# Patient Record
Sex: Female | Born: 1965 | Race: Black or African American | Hispanic: No | Marital: Single | Smoking: Never smoker
Health system: Southern US, Community
[De-identification: ages and names within clinical notes are randomized; demographics above are authoritative.]

## PROBLEM LIST (undated history)

## (undated) DIAGNOSIS — K224 Dyskinesia of esophagus: Secondary | ICD-10-CM

## (undated) DIAGNOSIS — I739 Peripheral vascular disease, unspecified: Secondary | ICD-10-CM

## (undated) DIAGNOSIS — K219 Gastro-esophageal reflux disease without esophagitis: Secondary | ICD-10-CM

## (undated) DIAGNOSIS — D259 Leiomyoma of uterus, unspecified: Secondary | ICD-10-CM

## (undated) DIAGNOSIS — E559 Vitamin D deficiency, unspecified: Secondary | ICD-10-CM

## (undated) DIAGNOSIS — R202 Paresthesia of skin: Secondary | ICD-10-CM

## (undated) DIAGNOSIS — R700 Elevated erythrocyte sedimentation rate: Secondary | ICD-10-CM

## (undated) DIAGNOSIS — I729 Aneurysm of unspecified site: Secondary | ICD-10-CM

## (undated) DIAGNOSIS — D472 Monoclonal gammopathy: Secondary | ICD-10-CM

## (undated) DIAGNOSIS — D62 Acute posthemorrhagic anemia: Secondary | ICD-10-CM

## (undated) DIAGNOSIS — T81718A Complication of other artery following a procedure, not elsewhere classified, initial encounter: Secondary | ICD-10-CM

## (undated) DIAGNOSIS — I3139 Other pericardial effusion (noninflammatory): Secondary | ICD-10-CM

## (undated) DIAGNOSIS — R079 Chest pain, unspecified: Secondary | ICD-10-CM

## (undated) DIAGNOSIS — R200 Anesthesia of skin: Secondary | ICD-10-CM

## (undated) DIAGNOSIS — Z862 Personal history of diseases of the blood and blood-forming organs and certain disorders involving the immune mechanism: Secondary | ICD-10-CM

## (undated) DIAGNOSIS — I10 Essential (primary) hypertension: Secondary | ICD-10-CM

## (undated) HISTORY — DX: Chest pain, unspecified: R07.9

## (undated) HISTORY — DX: Dyskinesia of esophagus: K22.4

## (undated) HISTORY — PX: BREAST EXCISIONAL BIOPSY: SUR124

## (undated) HISTORY — DX: Monoclonal gammopathy: D47.2

## (undated) HISTORY — DX: Paresthesia of skin: R20.2

## (undated) HISTORY — DX: Vitamin D deficiency, unspecified: E55.9

## (undated) HISTORY — PX: OTHER SURGICAL HISTORY: SHX169

## (undated) HISTORY — DX: Other pericardial effusion (noninflammatory): I31.39

## (undated) HISTORY — DX: Leiomyoma of uterus, unspecified: D25.9

## (undated) HISTORY — DX: Gastro-esophageal reflux disease without esophagitis: K21.9

## (undated) HISTORY — PX: BREAST LUMPECTOMY: SHX2

## (undated) HISTORY — DX: Essential (primary) hypertension: I10

## (undated) HISTORY — DX: Anesthesia of skin: R20.0

## (undated) HISTORY — PX: BREAST BIOPSY: SHX20

---
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
## 2012-10-03 ENCOUNTER — Other Ambulatory Visit: Payer: Self-pay

## 2012-10-03 DIAGNOSIS — Z1231 Encounter for screening mammogram for malignant neoplasm of breast: Secondary | ICD-10-CM

## 2012-10-05 ENCOUNTER — Ambulatory Visit: Payer: Self-pay

## 2012-10-05 ENCOUNTER — Other Ambulatory Visit: Payer: Self-pay | Admitting: Family Medicine

## 2012-10-05 DIAGNOSIS — R1900 Intra-abdominal and pelvic swelling, mass and lump, unspecified site: Secondary | ICD-10-CM

## 2012-10-19 ENCOUNTER — Other Ambulatory Visit (HOSPITAL_COMMUNITY)
Admission: RE | Admit: 2012-10-19 | Discharge: 2012-10-19 | Disposition: A | Discharge status: Home / Self Care | Payer: Managed Care, Other (non HMO) | Source: Ambulatory Visit | Attending: Family Medicine | Admitting: Family Medicine

## 2012-10-19 ENCOUNTER — Ambulatory Visit
Admission: RE | Admit: 2012-10-19 | Discharge: 2012-10-19 | Disposition: A | Discharge status: Home / Self Care | Payer: Managed Care, Other (non HMO) | Source: Ambulatory Visit | Attending: Family Medicine | Admitting: Family Medicine

## 2012-10-19 ENCOUNTER — Other Ambulatory Visit: Payer: Self-pay | Admitting: Family Medicine

## 2012-10-19 DIAGNOSIS — Z113 Encounter for screening for infections with a predominantly sexual mode of transmission: Secondary | ICD-10-CM | POA: Insufficient documentation

## 2012-10-19 DIAGNOSIS — Z124 Encounter for screening for malignant neoplasm of cervix: Secondary | ICD-10-CM | POA: Insufficient documentation

## 2012-10-19 DIAGNOSIS — R1900 Intra-abdominal and pelvic swelling, mass and lump, unspecified site: Secondary | ICD-10-CM

## 2012-10-19 DIAGNOSIS — R87810 Cervical high risk human papillomavirus (HPV) DNA test positive: Secondary | ICD-10-CM | POA: Insufficient documentation

## 2012-10-19 DIAGNOSIS — Z1151 Encounter for screening for human papillomavirus (HPV): Secondary | ICD-10-CM | POA: Insufficient documentation

## 2012-10-20 ENCOUNTER — Ambulatory Visit
Admission: RE | Admit: 2012-10-20 | Discharge: 2012-10-20 | Disposition: A | Discharge status: Home / Self Care | Payer: Managed Care, Other (non HMO) | Source: Ambulatory Visit

## 2012-10-20 DIAGNOSIS — Z1231 Encounter for screening mammogram for malignant neoplasm of breast: Secondary | ICD-10-CM

## 2013-02-05 ENCOUNTER — Telehealth: Payer: Self-pay | Admitting: *Deleted

## 2013-02-05 NOTE — Telephone Encounter (Signed)
S/w pt gxt is changed to 10/16 @ 1:00 per Christina [name] and pt advised and verbally understood and is agreeable to plan

## 2013-02-12 ENCOUNTER — Other Ambulatory Visit (HOSPITAL_COMMUNITY): Payer: Self-pay | Admitting: Cardiology

## 2013-02-12 DIAGNOSIS — R079 Chest pain, unspecified: Secondary | ICD-10-CM

## 2013-02-15 ENCOUNTER — Other Ambulatory Visit (HOSPITAL_COMMUNITY): Payer: Managed Care, Other (non HMO)

## 2013-02-15 ENCOUNTER — Encounter: Payer: Managed Care, Other (non HMO) | Admitting: Nurse Practitioner

## 2013-06-04 ENCOUNTER — Encounter: Payer: Self-pay | Admitting: *Deleted

## 2013-06-04 ENCOUNTER — Encounter: Payer: Self-pay | Admitting: Cardiology

## 2013-06-04 DIAGNOSIS — E559 Vitamin D deficiency, unspecified: Secondary | ICD-10-CM | POA: Insufficient documentation

## 2013-06-04 DIAGNOSIS — D259 Leiomyoma of uterus, unspecified: Secondary | ICD-10-CM | POA: Insufficient documentation

## 2013-06-04 DIAGNOSIS — R0789 Other chest pain: Secondary | ICD-10-CM | POA: Insufficient documentation

## 2013-08-10 ENCOUNTER — Emergency Department (HOSPITAL_BASED_OUTPATIENT_CLINIC_OR_DEPARTMENT_OTHER)
Admission: EM | Admit: 2013-08-10 | Discharge: 2013-08-10 | Discharge status: Against Medical Advice | Payer: Managed Care, Other (non HMO) | Attending: Emergency Medicine | Admitting: Emergency Medicine

## 2013-08-10 DIAGNOSIS — Y9389 Activity, other specified: Secondary | ICD-10-CM | POA: Insufficient documentation

## 2013-08-10 DIAGNOSIS — S01501A Unspecified open wound of lip, initial encounter: Secondary | ICD-10-CM | POA: Insufficient documentation

## 2013-08-10 DIAGNOSIS — Y92410 Unspecified street and highway as the place of occurrence of the external cause: Secondary | ICD-10-CM | POA: Insufficient documentation
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
## 2013-08-10 NOTE — ED Notes (Addendum)
Pt refusing evaluation and treatment and left ED.  Discussed treatment options with pt and pt continues to want to leave.  Pt CAOx4 and ambulatory when left ED. Pt left without signing.

## 2014-01-15 ENCOUNTER — Other Ambulatory Visit: Payer: Self-pay

## 2016-06-23 ENCOUNTER — Ambulatory Visit (INDEPENDENT_AMBULATORY_CARE_PROVIDER_SITE_OTHER): Payer: BLUE CROSS/BLUE SHIELD | Admitting: Family Medicine

## 2016-06-23 ENCOUNTER — Encounter: Payer: Self-pay | Admitting: Internal Medicine

## 2016-06-23 ENCOUNTER — Encounter: Payer: Self-pay | Admitting: Family Medicine

## 2016-06-23 ENCOUNTER — Other Ambulatory Visit: Payer: Self-pay | Admitting: Family Medicine

## 2016-06-23 VITALS — BP 134/88 | HR 84 | Temp 98.9°F | Ht 65.7 in | Wt 251.4 lb

## 2016-06-23 DIAGNOSIS — Z1159 Encounter for screening for other viral diseases: Secondary | ICD-10-CM

## 2016-06-23 DIAGNOSIS — Z800 Family history of malignant neoplasm of digestive organs: Secondary | ICD-10-CM

## 2016-06-23 DIAGNOSIS — Z13220 Encounter for screening for lipoid disorders: Secondary | ICD-10-CM | POA: Diagnosis not present

## 2016-06-23 DIAGNOSIS — Z1329 Encounter for screening for other suspected endocrine disorder: Secondary | ICD-10-CM

## 2016-06-23 DIAGNOSIS — Z1211 Encounter for screening for malignant neoplasm of colon: Secondary | ICD-10-CM

## 2016-06-23 DIAGNOSIS — Z131 Encounter for screening for diabetes mellitus: Secondary | ICD-10-CM

## 2016-06-23 DIAGNOSIS — R072 Precordial pain: Secondary | ICD-10-CM | POA: Diagnosis not present

## 2016-06-23 DIAGNOSIS — Z1231 Encounter for screening mammogram for malignant neoplasm of breast: Secondary | ICD-10-CM

## 2016-06-23 NOTE — Progress Notes (Signed)
Pre visit review using our clinic tool,if applicable. No additional management support is needed unless otherwise documented below in the visit note.  

## 2016-06-23 NOTE — Progress Notes (Signed)
[place] at [facility] [address] [place], [place] [address] [phone]  Date:  06/23/2016   Name:  Christina [name]   DOB:  Feb 13, 1966   MRN:  [id number]  PCP:  Lamar Blinks, MD    Chief Complaint: Establish Care   History of Present Illness:  Christina [name] is a 51 y.o. very pleasant female patient who presents with the following:  Here today as a new patient.  She is not new to this area however- admits that she has not had much in the way of medical care over the last several years  She is due for a pap, physical, mammogram Her mother was dx with colon cancer- she needs to see GI herself as she has never had a colonoscopy.   Her mother is doing well thankfully  She is not on any medications Her last pap was several years ago. She may have had an abnl many years ago but none more recently.  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   Also has had noted a pain that will be in her epigastric area and then "roll up my throat and into the lower jaw," she has noted this for about 2 years.  It can occur any time, and may last 5- 10 minutes Most recently occurred last week.  Is it NOT associated with exercise and she cannot determine any other inciting factors. No active CP or SOB. She has not sought evaluation for this in the past and has not tried any treatment for same  Patient Active Problem List   Diagnosis Date Noted  . Uterine fibroid   . Vitamin D deficiency   . Chest pain     Past Medical History:  Diagnosis Date  . Chest pain   . Uterine fibroid   . Vitamin D deficiency     Past Surgical History:  Procedure Laterality Date  . lump removed from breast at age 46    . ovarian cyst removed at 51yr old      Social  History  Substance Use Topics  . Smoking status: Never Smoker  . Smokeless tobacco: Not on file  . Alcohol use No    Family History  Problem Relation Age of Onset  . Hypertension Mother   . Cancer Mother   . Dementia Father     No Known Allergies  Medication list has been reviewed and updated.  No current outpatient prescriptions on file prior to visit.   No current facility-administered medications on file prior to visit.     Review of Systems:  As per HPI- otherwise negative.   Physical Examination: Vitals:   06/23/16 1409  BP: 134/88  Pulse: 84  Temp: 98.9 F (37.2 C)   Vitals:   06/23/16 1409  Weight: 251 lb 6.4 oz (114 kg)  Height: 5' 5.7" (1.669 m)   Body mass index is 40.95 kg/m. Ideal Body Weight: Weight in (lb) to have BMI = 25: 153.2  GEN: WDWN, NAD, Non-toxic, A & O x 3, obese, otherwise looks well HEENT: Atraumatic, Normocephalic. Neck supple. No masses, No LAD.  Bilateral TM wnl, oropharynx normal.  PEERL,EOMI.   Ears and Nose: No  external deformity. CV: RRR, No M/G/R. No JVD. No thrill. No extra heart sounds. PULM: CTA B, no wheezes, crackles, rhonchi. No retractions. No resp. distress. No accessory muscle use. ABD: S, NT, ND, +BS. No rebound. No HSM. EXTR: No c/c/e NEURO Normal gait.  PSYCH: Normally interactive. Conversant. Not depressed or anxious appearing.  Calm demeanor.   EKG: no old tracing for comparison. SR with possible LVH, early repol and non specific T wave abnl.  Rate 81 Assessment and Plan: Precordial pain - Plan: EKG 12-Lead, Ambulatory referral to Cardiology  Family history of colon cancer - Plan: Ambulatory referral to Gastroenterology  Screening for colon cancer - Plan: CBC, Ambulatory referral to Gastroenterology  Screening for thyroid disorder - Plan: TSH  Encounter for hepatitis C screening test for low risk patient - Plan: Hepatitis C antibody  Screening for hyperlipidemia - Plan: Lipid panel  Screening for  diabetes mellitus - Plan: Comprehensive metabolic panel, Hemoglobin A1c  Here today to establish care and discuss a few concerns She is quite behind on her health maint- will work on catching up as above She has noted epigastric/ chest pain as discussed in the HPI Will refer to cardiology for eval asap, but do suspect this may be GI in origin.  Will try having her take an H2 blocker daily and use tums in the case of acute pain.    See patient instructions for more details.   Asked her to see me soon for her pap and to discuss a chronic knee pain   Signed Lamar Blinks, MD

## 2016-06-23 NOTE — Patient Instructions (Signed)
I am going to refer you to see a GI doctor and also a cardiologist.  In the meantime I would suggest that you try taking zantac once a day for about one month, and chew up a dose of tums if you have an attack of pain. If this helps your pain may well be due to stomach acid.  However if you have any different or worsening episodes of pain in the meantime please seek care I will be in touch with your labs asap  Please schedule a follow-up visit to see me and have your pap/ discuss your knee problem soon

## 2016-06-24 ENCOUNTER — Encounter: Payer: Self-pay | Admitting: Family Medicine

## 2016-06-24 DIAGNOSIS — R7303 Prediabetes: Secondary | ICD-10-CM

## 2016-06-24 LAB — COMPREHENSIVE METABOLIC PANEL
ALT: 10 U/L (ref 0–35)
AST: 16 U/L (ref 0–37)
Albumin: 4 g/dL (ref 3.5–5.2)
Alkaline Phosphatase: 77 U/L (ref 39–117)
BUN: 12 mg/dL (ref 6–23)
CO2: 28 mEq/L (ref 19–32)
Calcium: 9.2 mg/dL (ref 8.4–10.5)
Chloride: 105 mEq/L (ref 96–112)
Creatinine, Ser: 0.8 mg/dL (ref 0.40–1.20)
GFR: 96.72 mL/min (ref >60.00)
Glucose, Bld: 96 mg/dL (ref 70–99)
Potassium: 4.7 mEq/L (ref 3.5–5.1)
Sodium: 136 mEq/L (ref 135–145)
Total Bilirubin: 0.2 mg/dL (ref 0.2–1.2)
Total Protein: 7.8 g/dL (ref 6.0–8.3)

## 2016-06-24 LAB — CBC
HCT: 39 % (ref 36.0–46.0)
Hemoglobin: 12.7 g/dL (ref 12.0–15.0)
MCHC: 32.7 g/dL (ref 30.0–36.0)
MCV: 87.1 fl (ref 78.0–100.0)
Platelets: 234 10*3/uL (ref 150.0–400.0)
RBC: 4.48 Mil/uL (ref 3.87–5.11)
RDW: 16.3 % — ABNORMAL HIGH (ref 11.5–15.5)
WBC: 4.6 10*3/uL (ref 4.0–10.5)

## 2016-06-24 LAB — LIPID PANEL
Cholesterol: 179 mg/dL (ref 0–200)
HDL: 66.7 mg/dL
LDL Cholesterol: 94 mg/dL (ref 0–99)
NonHDL: 112.26
Total CHOL/HDL Ratio: 3
Triglycerides: 93 mg/dL (ref 0.0–149.0)
VLDL: 18.6 mg/dL (ref 0.0–40.0)

## 2016-06-24 LAB — TSH: TSH: 0.42 u[IU]/mL (ref 0.35–4.50)

## 2016-06-24 LAB — HEMOGLOBIN A1C: Hgb A1c MFr Bld: 6 % (ref 4.6–6.5)

## 2016-06-24 LAB — HEPATITIS C ANTIBODY: HCV Ab: NEGATIVE

## 2016-06-26 ENCOUNTER — Encounter (HOSPITAL_BASED_OUTPATIENT_CLINIC_OR_DEPARTMENT_OTHER): Payer: Self-pay

## 2016-06-26 ENCOUNTER — Ambulatory Visit (HOSPITAL_BASED_OUTPATIENT_CLINIC_OR_DEPARTMENT_OTHER)
Admission: RE | Admit: 2016-06-26 | Discharge: 2016-06-26 | Disposition: A | Discharge status: Home / Self Care | Payer: BLUE CROSS/BLUE SHIELD | Source: Ambulatory Visit | Attending: Family Medicine | Admitting: Family Medicine

## 2016-06-26 DIAGNOSIS — Z1231 Encounter for screening mammogram for malignant neoplasm of breast: Secondary | ICD-10-CM

## 2016-06-30 ENCOUNTER — Ambulatory Visit: Payer: BLUE CROSS/BLUE SHIELD | Admitting: Cardiovascular Disease

## 2016-07-08 ENCOUNTER — Ambulatory Visit (INDEPENDENT_AMBULATORY_CARE_PROVIDER_SITE_OTHER): Payer: BLUE CROSS/BLUE SHIELD | Admitting: Family Medicine

## 2016-07-08 ENCOUNTER — Encounter: Payer: Self-pay | Admitting: Family Medicine

## 2016-07-08 ENCOUNTER — Other Ambulatory Visit (HOSPITAL_COMMUNITY)
Admission: RE | Admit: 2016-07-08 | Discharge: 2016-07-08 | Disposition: A | Discharge status: Home / Self Care | Payer: BLUE CROSS/BLUE SHIELD | Source: Ambulatory Visit | Attending: Family Medicine | Admitting: Family Medicine

## 2016-07-08 VITALS — BP 162/100 | HR 90 | Temp 98.5°F | Ht 66.0 in | Wt 257.2 lb

## 2016-07-08 DIAGNOSIS — Z124 Encounter for screening for malignant neoplasm of cervix: Secondary | ICD-10-CM

## 2016-07-08 DIAGNOSIS — Z0000 Encounter for general adult medical examination without abnormal findings: Secondary | ICD-10-CM | POA: Diagnosis not present

## 2016-07-08 DIAGNOSIS — E669 Obesity, unspecified: Secondary | ICD-10-CM

## 2016-07-08 DIAGNOSIS — I10 Essential (primary) hypertension: Secondary | ICD-10-CM

## 2016-07-08 DIAGNOSIS — D259 Leiomyoma of uterus, unspecified: Secondary | ICD-10-CM | POA: Diagnosis not present

## 2016-07-08 DIAGNOSIS — Z1151 Encounter for screening for human papillomavirus (HPV): Secondary | ICD-10-CM | POA: Insufficient documentation

## 2016-07-08 DIAGNOSIS — Z113 Encounter for screening for infections with a predominantly sexual mode of transmission: Secondary | ICD-10-CM | POA: Insufficient documentation

## 2016-07-08 DIAGNOSIS — N921 Excessive and frequent menstruation with irregular cycle: Secondary | ICD-10-CM

## 2016-07-08 DIAGNOSIS — Z01411 Encounter for gynecological examination (general) (routine) with abnormal findings: Secondary | ICD-10-CM | POA: Insufficient documentation

## 2016-07-08 HISTORY — DX: Essential (primary) hypertension: I10

## 2016-07-08 MED ORDER — LISINOPRIL 20 MG PO TABS: ORAL_TABLET | ORAL | 5 refills | Status: DC

## 2016-07-08 NOTE — Progress Notes (Addendum)
[place] at [facility] [place], [place], [place] [address] [phone]  Date:  07/08/2016   Name:  Christina [name]   DOB:  October 24, 1965   MRN:  [id number]  PCP:  Lamar Blinks, MD    Chief Complaint: Gynecologic Exam (Pt here for PAP smear. )   History of Present Illness:  Christina [name] is a 51 y.o. very pleasant female patient who presents with the following:  I saw her as a new patient in February- she had been absent from health care for some time Labs at that time looked overall good- A1c at 6% She got her mammogram- normal She has scheduled a colonoscopy She mentions that she tends to have heavy vaginal bleeding every 2 weeks, and it will last for 7 days. She endorses passing a lot of blood.  She did have a pelvic US back in 2014 which showed fibroids  Recent CBC did not show anemia  Here today for PAP.  Last PAP 10-09-2012.  Results from last PAP:  NEGATIVE FOR INTRAEPITHELIAL LESIONS OR MALIGNANCY. BENIGN REACTIVE/REPARATIVE CHANGES. POSITIVE for Prevotella bivia  Never been on any BP medication - however she notes that she has been told that her BP was high on several occasions and she would like to have treatment if needed She reports a strong family history of HTN She notes periodic HA and wonders if this may be due to HTN Her BP is at clinic with her today- she is SA with him per her report    BP Readings from Last 3 Encounters:  07/08/16 (!) 172/102  06/23/16 134/88     Patient Active Problem List   Diagnosis Date Noted  . Pre-diabetes 06/24/2016  . Uterine fibroid   . Vitamin D deficiency   . Chest pain     Past Medical History:  Diagnosis Date  . Chest pain   . Uterine fibroid   . Vitamin D deficiency     Past Surgical History:  Procedure Laterality Date  . lump removed from breast at age 27    . ovarian cyst removed at 51yr old      Social History  Substance Use Topics  . Smoking  status: Never Smoker  . Smokeless tobacco: Not on file  . Alcohol use No    Family History  Problem Relation Age of Onset  . Hypertension Mother   . Cancer Mother   . Dementia Father     No Known Allergies  Medication list has been reviewed and updated.  No current outpatient prescriptions on file prior to visit.   No current facility-administered medications on file prior to visit.     Review of Systems:  As per HPI- otherwise negative. No fever, chills, nausea, vomiting, diarrhea, rash, CP or SOB  Physical Examination: Vitals:   07/08/16 1720 07/08/16 1724  BP: (!) 178/102 (!) 172/102  Pulse: 90   Temp: 98.5 F (36.9 C)    Vitals:   07/08/16 1720  Weight: 257 lb 3.2 oz (116.7 kg)  Height: 5\' 6"  (1.676 m)   Body mass index is 41.51 kg/m. Ideal Body Weight: Weight in (lb) to have BMI = 25: 154.6  GEN: WDWN, NAD, Non-toxic, A & O x 3, obese, ow looks well HEENT: Atraumatic, Normocephalic. Neck supple. No masses, No LAD. Ears and Nose: No external deformity. CV: RRR, No M/G/R. No JVD. No thrill. No extra heart sounds. PULM: CTA B, no wheezes, crackles, rhonchi.  No retractions. No resp. distress. No accessory muscle use. ABD: S, NT, ND, +BS. No rebound. No HSM. EXTR: No c/c/e NEURO Normal gait.  PSYCH: Normally interactive. Conversant. Not depressed or anxious appearing.  Calm demeanor.  Pelvic: thick fishy discharge in vault.   Uterus normal, no CMT, no adnexal tendereness or masses  Assessment and Plan:  Wellness examination  Screening for cervical cancer - Plan: Cytology - PAP, CANCELED: Cytology - PAP  Menorrhagia with irregular cycle - Plan: Ambulatory referral to Obstetrics / Gynecology  Uterine leiomyoma, unspecified location  Essential hypertension - Plan: lisinopril (PRINIVIL,ZESTRIL) 20 MG tablet  Here today for her wellness exam- she has done a great job catching up with her helth care  She is not sure of the exact date but feels sure that  her last tetanus was less than 10 years ago Pap done today- will be in touch with results.  Also added gc/chlamydia and trick to pap due to discharge present on exam She notes bothersome menorrhagia and really would like to do something about this if possible.  Discussed a few possible treatment options and referred to OBG to discuss which would be most appropriate for her Will start on 10- mg of lisinopril for HTN Encouraged weight loss  She will check her BP a few times a week and contact me with an update- we may increase to 20 mg if indicated   Signed Lamar Blinks, MD  Received her pap results 3/13 Results for orders placed or performed in visit on 07/08/16  Cytology - PAP  Result Value Ref Range   Adequacy      Satisfactory for evaluation  endocervical/transformation zone component ABSENT.   Diagnosis      NEGATIVE FOR INTRAEPITHELIAL LESIONS OR MALIGNANCY.   Diagnosis CELLULAR CHANGES CONSISTENT WITH HYPERKERATOSIS.    Diagnosis SHIFT IN FLORA SUGGESTIVE OF BACTERIAL VAGINOSIS.    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  Will treat for BV with flagyl No pap in many years Difficult pap due to obesity, transformation zone absent and positive for HPV- will refer to OBG for possible colposcopy

## 2016-07-08 NOTE — Patient Instructions (Signed)
We will send off your pap and I will be in touch with this asap I am going to refer you to OB-GYN to discuss your heavy menstrual bleeding you may be a candidate for an ablation or other treatment for your fibroids  Please start on a 1/2 pill of the lisinopril- 10 mg- once a day.  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  We can increase to a whole tablet as needed

## 2016-07-09 ENCOUNTER — Encounter: Payer: Self-pay | Admitting: Physician Assistant

## 2016-07-12 LAB — CYTOLOGY - PAP
Adequacy: ABSENT
Chlamydia: NEGATIVE
Diagnosis: NEGATIVE
HPV: DETECTED — AB
Neisseria Gonorrhea: NEGATIVE
Trichomonas: NEGATIVE

## 2016-07-14 ENCOUNTER — Telehealth: Payer: Self-pay | Admitting: Family Medicine

## 2016-07-14 DIAGNOSIS — B9689 Other specified bacterial agents as the cause of diseases classified elsewhere: Secondary | ICD-10-CM

## 2016-07-14 DIAGNOSIS — B977 Papillomavirus as the cause of diseases classified elsewhere: Secondary | ICD-10-CM

## 2016-07-14 DIAGNOSIS — N760 Acute vaginitis: Principal | ICD-10-CM

## 2016-07-14 MED ORDER — METRONIDAZOLE 500 MG PO TABS: 500.0000 mg | ORAL_TABLET | Freq: Two times a day (BID) | ORAL | 0 refills | Status: DC

## 2016-07-14 NOTE — Telephone Encounter (Signed)
Called her- HPV on her pap.  Will refer to OBG for eval, ? Colposcopy Also will treat for BV with flagyl  She states understanding

## 2016-07-16 ENCOUNTER — Ambulatory Visit: Payer: BLUE CROSS/BLUE SHIELD | Admitting: Physician Assistant

## 2016-08-04 ENCOUNTER — Telehealth: Payer: Self-pay | Admitting: Family Medicine

## 2016-08-04 NOTE — Telephone Encounter (Signed)
Pt called and states received a bill on her last visit 08-01-16 her visit was coded wrong, pt states had her Wellness Visit and that it should be under wellness visit. Please verify and correct if possible. Pt also mention will be calling billing department. (pt insurance [employer])

## 2016-08-10 ENCOUNTER — Telehealth: Payer: Self-pay

## 2016-08-10 NOTE — Telephone Encounter (Signed)
Patient stated she needs another referral to the Cardiologist. She stated the office she was referred to has cancelled on her 3 times and pushed her appointments out further and further.     Please advise

## 2016-08-11 NOTE — Telephone Encounter (Signed)
Referral was placed 06/23/16, pt was contacted 06/28/16 and scheduled for 06/30/16 with Dr. Johnsie Cancel, pt declined and was rescheduled for 07/16/16 with Richardson Dopp, [place]  07/13/16 pt was contact that Richardson Dopp, [place] would be out of office and appt was rescheduled for 08/16/16 with Dr. Saunders Revel, pt was contacted on 08/06/16 to be rescheduled due to scheduling error and pt was in an incorrect slot, pt was rescheduled for 08/16/16 with Richardson Dopp, [place] - at that time pt declined appt with PA stating she wanted to see a doctor and she was rescheduled for first available appt with doctor 09/06/16 Dr. Acie Fredrickson  I have contacted [facility] to discuss and they do not have any appts available prior to 09/06/16 with any doctor. Pt has declined appts with PA.   Please advise.

## 2016-08-11 NOTE — Telephone Encounter (Signed)
Can we please call [facility] and see what the issue is, try to get her scheduled?  Thank you!

## 2016-08-12 NOTE — Telephone Encounter (Signed)
Ok- since it has been this long I think the 5/7 appt is ok.  Does she still have this appt?

## 2016-08-12 NOTE — Telephone Encounter (Signed)
Patient called to inquire about the bill she received for her New Patient appointment with Dr. Lorelei Pont. States she was not informed that her appointment was a NP appointment, she asked for a CPE. Explained to patient that the initial appointment is a NP appointment with provider. Patient stated that if she knew that she would not have come because she has a $300 bill and Dr. Lorelei Pont did labs, EKG and Pap on that day. Explained to patient that if the provider performed all of those things it was a CPE. Explained that her concerns had been forwarded to Ricardo Jericho who will research and adjust what needs to be adjusted. Patient stated she had been waiting since 4/4 and being told that someone would call her. Stated she need to find another practice that know how to do billing.

## 2016-08-13 NOTE — Telephone Encounter (Signed)
Pt has been notified and understood. She is fine with keeping the appt for 5/7.

## 2016-08-13 NOTE — Telephone Encounter (Signed)
Yes, 5/7 appt is still standing

## 2016-08-13 NOTE — Telephone Encounter (Signed)
Coding has reviewed and the visit is coded correctly. Patient was established on 06/23/16 a couple other concerns were addressed. I left patient a message to call back for additional information,.

## 2016-08-13 NOTE — Telephone Encounter (Signed)
Ok- please give her a call and let her know that I am ok with the 5/7 appt.  We can certainly refer her to a different office but that would mean starting over and would probably take longer

## 2016-08-13 NOTE — Telephone Encounter (Signed)
Patient is questioning the coding on her initial visit. I informed the patient coding will review and I will update her on any corrections

## 2016-08-16 ENCOUNTER — Encounter: Payer: BLUE CROSS/BLUE SHIELD | Admitting: Internal Medicine

## 2016-08-16 ENCOUNTER — Ambulatory Visit: Payer: BLUE CROSS/BLUE SHIELD | Admitting: Internal Medicine

## 2016-08-16 ENCOUNTER — Ambulatory Visit: Payer: BLUE CROSS/BLUE SHIELD | Admitting: Physician Assistant

## 2016-08-17 ENCOUNTER — Other Ambulatory Visit: Payer: Self-pay | Admitting: Family Medicine

## 2016-08-17 NOTE — Telephone Encounter (Signed)
Relation to FB:PPHK Call back number: [phone]  Pharmacy: [facility], [place] [phone] (Phone) [phone] (Fax)     Reason for call:  Patient states lisinopril (PRINIVIL,ZESTRIL) 20 MG tablet is causing patient to cough which is affecting her work, please advise.

## 2016-08-18 MED ORDER — LOSARTAN POTASSIUM 50 MG PO TABS: 50.0000 mg | ORAL_TABLET | Freq: Every day | ORAL | 3 refills | Status: DC

## 2016-08-18 NOTE — Telephone Encounter (Signed)
Called her back- her BP is 138/80, she is doing well with the lisinopril except for her cough.  Will change to losartan.  She will let me know if the cough does not resolve with this change

## 2016-08-18 NOTE — Telephone Encounter (Signed)
Returned patient's phone call/ Left message for her to call me back

## 2016-08-18 NOTE — Telephone Encounter (Signed)
Patient calling check status of medication change. Medication is making her cough please advise.

## 2016-08-25 ENCOUNTER — Encounter: Payer: Self-pay | Admitting: Obstetrics & Gynecology

## 2016-08-25 ENCOUNTER — Ambulatory Visit (INDEPENDENT_AMBULATORY_CARE_PROVIDER_SITE_OTHER): Payer: BLUE CROSS/BLUE SHIELD | Admitting: Obstetrics & Gynecology

## 2016-08-25 VITALS — BP 150/94 | HR 98 | Ht 67.0 in | Wt 258.0 lb

## 2016-08-25 DIAGNOSIS — D259 Leiomyoma of uterus, unspecified: Secondary | ICD-10-CM | POA: Diagnosis not present

## 2016-08-25 DIAGNOSIS — N939 Abnormal uterine and vaginal bleeding, unspecified: Secondary | ICD-10-CM

## 2016-08-25 NOTE — Progress Notes (Signed)
Christina [name] complaining of heavy periods. Christina [name] states that last month she had a periods three times. Christina [name] states she also had an abnormal pap that Dr. Edilia Bo did and she needs that reviewed. Kathrene Alu RNBSN

## 2016-08-25 NOTE — Patient Instructions (Addendum)
GO WHITE: Soap: UNSCENTED Dove (white box light green writing) Laundry detergent (underwear)- Dreft or [employer] unscented WHITE 100% cotton panties (NOT just cotton crouch) Sanitary napkin/panty liners: UNSCENTED.  If it doesn't SAY unscented it can have a scent/perfume    NO PERFUMES OR LOTIONS OR POTIONS in the vulvar area (may use regular KY) Condoms: hypoallergenic only. Non dyed (no color) Toilet papers: white only Wash clothes: use a separate wash cloth. WHITE.  Washed in Dreft.    Uterine Fibroids Uterine fibroids are tissue masses (tumors) that can develop in the womb (uterus). They are also called leiomyomas. This type of tumor is not cancerous (benign) and does not spread to other parts of the body outside of the pelvic area, which is between the hip bones. Occasionally, fibroids may develop in the fallopian tubes, in the cervix, or on the support structures (ligaments) that surround the uterus. You can have one or many fibroids. Fibroids can vary in size, weight, and where they grow in the uterus. Some can become quite large. Most fibroids do not require medical treatment. What are the causes? A fibroid can develop when a single uterine cell keeps growing (replicating). Most cells in the human body have a control mechanism that keeps them from replicating without control. What are the signs or symptoms? Symptoms may include:  Heavy bleeding during your period.  Bleeding or spotting between periods.  Pelvic pain and pressure.  Bladder problems, such as needing to urinate more often (urinary frequency) or urgently.  Inability to reproduce offspring (infertility).  Miscarriages. How is this diagnosed? Uterine fibroids are diagnosed through a physical exam. Your health care provider may feel the lumpy tumors during a pelvic exam. Ultrasonography and an MRI may be done to determine the size, location, and number of fibroids. How is this treated? Treatment may  include:  Watchful waiting. This involves getting the fibroid checked by your health care provider to see if it grows or shrinks. Follow your health care provider's recommendations for how often to have this checked.  Hormone medicines. These can be taken by mouth or given through an intrauterine device (IUD).  Surgery.  Removing the fibroids (myomectomy) or the uterus (hysterectomy).  Removing blood supply to the fibroids (uterine artery embolization). If fibroids interfere with your fertility and you want to become pregnant, your health care provider may recommend having the fibroids removed. Follow these instructions at home:  Keep all follow-up visits as directed by your health care provider. This is important.  Take over-the-counter and prescription medicines only as told by your health care provider.  If you were prescribed a hormone treatment, take the hormone medicines exactly as directed.  Ask your health care provider about taking iron pills and increasing the amount of dark green, leafy vegetables in your diet. These actions can help to boost your blood iron levels, which may be affected by heavy menstrual bleeding.  Pay close attention to your period and tell your health care provider about any changes, such as:  Increased blood flow that requires you to use more pads or tampons than usual per month.  A change in the number of days that your period lasts per month.  A change in symptoms that are associated with your period, such as abdominal cramping or back pain. Contact a health care provider if:  You have pelvic pain, back pain, or abdominal cramps that cannot be controlled with medicines.  You have an increase in bleeding between and during periods.  You soak tampons or pads in a half hour or less.  You feel lightheaded, extra tired, or weak. Get help right away if:  You faint.  You have a sudden increase in pelvic pain. This information is not intended to  replace advice given to you by your health care provider. Make sure you discuss any questions you have with your health care provider. Document Released: 04/16/2000 Document Revised: 12/18/2015 Document Reviewed: 10/16/2013 Elsevier Interactive Patient Education  2017 [employer].

## 2016-08-25 NOTE — Progress Notes (Signed)
History:  51 y.o. G1P1 here today for AUB. LMP 07/19/2016 Pt reports that her menese have been very heavy with clots. She was dx'd with fibroids.2-3 years prev.  Pt reports 7 days of heavy bleeding and last month the bleeding came 2x in 1 month.  She has not had a any bleeding since Mar.  Pt reports that she has also had hot flushes over the past 3 months.   Pt only had 1 months where has had bleeding more than once a month. Pt denies abd/pelvic pain.   Pt denies unexplained weight loss.    The following portions of the patient's history were reviewed and updated as appropriate: allergies, current medications, past family history, past medical history, past social history, past surgical history and problem list.  Review of Systems:  Pertinent items are noted in HPI.   Objective:  Physical Exam Blood pressure (!) 150/94, pulse 98, height 5\' 7"  (1.702 m), weight 258 lb (117 kg). BP (!) 150/94 (BP Location: Left Arm)   Pulse 98   Ht 5\' 7"  (1.702 m)   Wt 258 lb (117 kg)   LMP  (Approximate) Comment: multiple periods last month  BMI 40.41 kg/m  CONSTITUTIONAL: Well-developed, well-nourished female in no acute distress.  HENT:  Normocephalic, atraumatic EYES: Conjunctivae and EOM are normal. No scleral icterus.  NECK: Normal range of motion SKIN: Skin is warm and dry. No rash noted. Not diaphoretic.No pallor. Garden: Alert and oriented to person, place, and time. Normal coordination.  Lungs: CTA CV: RRR Abd: Soft, nontender and nondistended. Mass noted to umbilicus in the midline and above the umbilicus on the left side  Pelvic: Normal appearing external genitalia; normal appearing vaginal mucosa and cervix.  Normal discharge.  Enlarged uterus as above. >20 weeks sized. No adnexal tenderness. Adnexa difficult to assess due to uterine size and body habitus.  Labs and Imaging 07/13/2016 Neg PAP with +hrHPV  06/21/2012 Clinical Data: Abdominal pelvic mass.  History of fibroids  with surgeries in her with surgery in her twenties.  LMP 09/26/2012  TRANSABDOMINAL AND TRANSVAGINAL ULTRASOUND OF PELVIS Technique:  Both transabdominal and transvaginal ultrasound examinations of the pelvis were performed. Transabdominal technique was performed for global imaging of the pelvis including uterus, ovaries, adnexal regions, and pelvic cul-de-sac.  It was necessary to proceed with endovaginal exam following the transabdominal exam to visualize the the cervix and endometrium.  Comparison:  None  Findings:  Uterus: Is markedly enlarged with a sagittal length of 16 cm, depth of 18 cm and width of 20 cm.  The uterus extends to the level of the umbilicus.  Multiple fibroids are identified with the largest in the right posterolateral upper uterine segment measuring 8.7 x 6.2 x 9.5 cm and in the left anterolateral upper uterine segment measuring 9.6 x 6.8 x 8.1 cm. Smaller mural fibroids are seen in the right cornual region measuring 4.5 x 3.5 x 5.5 cm and in the anterior left lower uterine segment measuring 3.0 x 2.4 x 3.3 cm. More diffuse fibroid involvement is likely.  Endometrium: Is deviated posteriorly and to the right by the left sided fibroid.  This appears homogeneously echogenic with a width of 14.1 mm which may be accentuated due to deviation by the fibroid. The appearance would correlate with a presecretory endometrial stripe and correspond with the provided LMP of 09/26/2012.  No areas of focal thickening are seen.  Right ovary:  Is visualized transabdominally and has a normal appearance measuring 3.8 x 2.4 x 2.8  cm  Left ovary: Is visualized transabdominally and has a normal appearance measuring 3.2 x 2.0 x 3.4 cm  Other findings: No pelvic fluid is noted  IMPRESSION: Fibroid uterus with fibroid sizes and location as noted above. More diffuse fibroid involvement is likely.  Both ovaries are felt to be seen transabdominally and have a  normal appearance.  Assessment & Plan:  +hrHPV  Rec repeat PAP in 1 year.  Uterine fibroids- large  rec [place] to eval change in size and endometrium  I reviewed with pt the natural history of fibroids and sx  Reviewed potential tx options vs observation  AUB- suspect that it is due to the fibroids but, could aslo be related to perimenopause.  Pt may need an endobx if the AUB continues or if the endometrium is thickened  Elevated BP- h/o HTN  Total face-to-face time with patient was 30 min.  Greater than 50% was spent in counseling and coordination of care with the patient.   Jazion Atteberry L. Ihor Dow, M.D., Cherlynn June'

## 2016-09-01 ENCOUNTER — Telehealth: Payer: Self-pay | Admitting: Family Medicine

## 2016-09-01 MED ORDER — BENZONATATE 100 MG PO CAPS: 100.0000 mg | ORAL_CAPSULE | Freq: Three times a day (TID) | ORAL | 0 refills | Status: DC | PRN

## 2016-09-01 NOTE — Telephone Encounter (Signed)
Caller name: Relationship to patient: Self Can be reached:  [phone]  Pharmacy:  Reason for call: Patient request call back to discuss her cough. States provider informed her to call if she continued to cough after stopping the BP medication.

## 2016-09-01 NOTE — Telephone Encounter (Signed)
Returned patients call, left message for call black.

## 2016-09-01 NOTE — Telephone Encounter (Signed)
Spoke with pt- we changed her from an ACE to an ARB about 10 days ago.  However she continues to cough. She is otherwise feeling well rx for tessalon Asked her to give it another 7- 10 days to see if her cough will resolve. She will let me konw

## 2016-09-06 ENCOUNTER — Encounter: Payer: Self-pay | Admitting: Nurse Practitioner

## 2016-09-06 ENCOUNTER — Encounter (INDEPENDENT_AMBULATORY_CARE_PROVIDER_SITE_OTHER): Payer: Self-pay

## 2016-09-06 ENCOUNTER — Encounter: Payer: Self-pay | Admitting: Cardiovascular Disease

## 2016-09-06 ENCOUNTER — Ambulatory Visit (INDEPENDENT_AMBULATORY_CARE_PROVIDER_SITE_OTHER): Payer: BLUE CROSS/BLUE SHIELD | Admitting: Cardiovascular Disease

## 2016-09-06 VITALS — BP 162/100 | HR 83 | Ht 67.0 in | Wt 259.2 lb

## 2016-09-06 DIAGNOSIS — R0789 Other chest pain: Secondary | ICD-10-CM

## 2016-09-06 DIAGNOSIS — I10 Essential (primary) hypertension: Secondary | ICD-10-CM

## 2016-09-06 MED ORDER — CARVEDILOL 3.125 MG PO TABS: 3.1250 mg | ORAL_TABLET | Freq: Two times a day (BID) | ORAL | 11 refills | Status: DC

## 2016-09-06 MED ORDER — HYDROCHLOROTHIAZIDE 25 MG PO TABS: 25.0000 mg | ORAL_TABLET | Freq: Every day | ORAL | 11 refills | Status: DC

## 2016-09-06 MED ORDER — POTASSIUM CHLORIDE ER 10 MEQ PO TBCR: 10.0000 meq | EXTENDED_RELEASE_TABLET | Freq: Every day | ORAL | 11 refills | Status: DC

## 2016-09-06 NOTE — Patient Instructions (Addendum)
Medication Instructions:  START Carvedilol (Coreg) 3.125 mg twice daily START HCTZ (Hydrochlorothiazide) 25 mg once daily START Kdur (potassium) 10 meq once daily   Labwork: Your physician recommends that you return for lab work in: 3 weeks for basic metabolic panel   Testing/Procedures: None Ordered  Follow-Up: Your physician recommends that you schedule a follow-up appointment in: 4-6 weeks with Dr. Acie Fredrickson    If you need a refill on your cardiac medications before your next appointment, please call your pharmacy.   Thank you for choosing [facility]! Christen Bame, RN [phone]

## 2016-09-06 NOTE — Progress Notes (Signed)
Cardiology Office Note   Date:  09/06/2016   ID:  Christina [name], DOB [birth year], MRN [id number]  PCP:  Darreld Mclean, MD  Cardiologist:   Mertie Moores, MD   Chief Complaint  Patient presents with  . Hypertension  . Chest Pain   Problem List 1. Chest pain  2. HTN    History of Present Illness:  Christina [name] is a 51 y.o. female who is being seen today for the evaluation of chest pain  at the request of Copland, Gay Filler, MD.  Has been having CP for the past year.  Tightness.  Lasts for a minute or so.  Not related to exertion, eating , drinking, change of position. Not pleuretic.    Resolves spontaneously after several minutes  Hx of HTN - was on Lisinopril but this caused a cough.   Makes no effort to avoid salt .  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 Chest pain   . Essential hypertension 07/08/2016  . Uterine fibroid   . Vitamin D deficiency     Past Surgical History:  Procedure Laterality Date  . lump removed from breast at age 89    . ovarian cyst removed at 51yr old       Current Outpatient Prescriptions  Medication Sig Dispense Refill  . losartan (COZAAR) 50 MG tablet Take 1 tablet (50 mg total) by mouth daily. 90 tablet 3  . metroNIDAZOLE (FLAGYL) 500 MG tablet Take 1 tablet (500 mg total) by mouth 2 (two) times daily. Take 1 pill twice daily for one week. NO alcohol 14 tablet 0  . benzonatate (TESSALON) 100 MG capsule Take 1 capsule (100 mg total) by mouth 3 (three) times daily as needed for cough. (Patient not taking: Reported on 09/06/2016) 60 capsule 0   No current facility-administered medications for this visit.     PAD Screen 09/06/2016  Previous PAD dx? No  Previous surgical procedure? No  Pain with walking? Yes  Subsides with rest? Yes    Feet/toe relief with dangling? Yes  Painful, non-healing ulcers? No  Extremities discolored? No      Allergies:   Lisinopril    Social History:  The patient  reports that she has never smoked. She has never used smokeless tobacco. She reports that she does not drink alcohol or use drugs.   Family History:  The patient's family history includes Cancer in her mother; Dementia in her father; Hypertension in her mother.    ROS:  Please see the history of present illness.    Review of Systems: Constitutional:  denies fever, chills, diaphoresis, appetite change and fatigue.  HEENT: denies photophobia, eye pain, redness, hearing loss, ear pain, congestion, sore throat, rhinorrhea, sneezing, neck pain, neck stiffness and tinnitus.  Respiratory: denies SOB, DOE, cough, chest tightness, and wheezing.  Cardiovascular: admits to chest pain, palpitations .  Denies leg swelling.  Gastrointestinal: denies nausea, vomiting, abdominal pain, diarrhea, constipation, blood in stool.  Genitourinary: denies dysuria, urgency, frequency, hematuria, flank pain and difficulty urinating.  Musculoskeletal: denies  myalgias, back pain, joint swelling, arthralgias and gait problem.   Skin: denies pallor, rash and wound.  Neurological: denies dizziness, seizures, syncope, weakness, light-headedness, numbness and headaches.   Hematological: denies adenopathy, easy bruising, personal or  family bleeding history.  Psychiatric/ Behavioral: denies suicidal ideation, mood changes, confusion, nervousness, sleep disturbance and agitation.       All other systems are reviewed and negative.    PHYSICAL EXAM: VS:  BP (!) 162/100 (BP Location: Right Arm, Cuff Size: Large)   Pulse 83   Ht 5\' 7"  (1.702 m)   Wt 259 lb 3.2 oz (117.6 kg)   SpO2 95%   BMI 40.60 kg/m  , BMI Body mass index is 40.6 kg/m. GEN: obese female  HEENT: normal  Neck: no JVD, carotid bruits, or masses Cardiac: RRR; no murmurs, rubs, or  gallops,no edema  Respiratory:  clear to auscultation bilaterally, normal work of breathing GI: soft, nontender, nondistended, + BS MS: no deformity or atrophy  Skin: warm and dry, no rash Neuro:  Strength and sensation are intact Psych: normal   EKG:  EKG is ordered today. The ekg ordered today demonstrates NSR , RAE.   No ST or T waves    Recent Labs: 06/23/2016: ALT 10; BUN 12; Creatinine, Ser 0.80; Hemoglobin 12.7; Platelets 234.0; Potassium 4.7; Sodium 136; TSH 0.42    Lipid Panel    Component Value Date/Time   CHOL 179 06/23/2016 1516   TRIG 93.0 06/23/2016 1516   HDL 66.70 06/23/2016 1516   CHOLHDL 3 06/23/2016 1516   VLDL 18.6 06/23/2016 1516   LDLCALC 94 06/23/2016 1516      Wt Readings from Last 3 Encounters:  09/06/16 259 lb 3.2 oz (117.6 kg)  08/25/16 258 lb (117 kg)  07/08/16 257 lb 3.2 oz (116.7 kg)      Other studies Reviewed: Additional studies/ records that were reviewed today include: . Review of the above records demonstrates:    ASSESSMENT AND PLAN:  1.  Chest pain: Her chest pain is very atypical. It occurs at random times does not occur with exertion. Her pains sound more consistent with gastroesophageal reflux disease.  She eats a very high fat and processed diet. She makes no effort to avoid salt. She makes no effort to avoid greasy foods.  We had long discussion about restricting salt and greasy foods. I've advised her to avoid fast foods. She needs  more grilled chicken and grilled fish. She is to take the salt out of her diet.  This point I do not think that she needs a stress Myoview study but I would have a low threshold to order a Myoview study if she starts having exertional chest pain.  2. Essential hypertension: Her blood pressures elevated. She makes no effort to avoid salt. We had long discussion about low salt diet. We will add HCTZ 25 mg a day and potassium chloride 10 mEq a day. We will also add carvedilol 3.125 mg twice a  day.    Current medicines are reviewed at length with the patient today.  The patient does not have concerns regarding medicines.  Labs/ tests ordered today include:  No orders of the defined types were placed in this encounter.   Disposition:   FU with me in 4-6 weeks      Mertie Moores, MD  09/06/2016 3:08 PM    [facility] [address], [place], [place]  [address] Phone: [phone]; Fax: [phone]

## 2016-09-09 ENCOUNTER — Encounter: Payer: Self-pay | Admitting: Obstetrics & Gynecology

## 2016-09-13 ENCOUNTER — Telehealth: Payer: Self-pay | Admitting: Obstetrics & Gynecology

## 2016-09-13 ENCOUNTER — Ambulatory Visit (HOSPITAL_BASED_OUTPATIENT_CLINIC_OR_DEPARTMENT_OTHER): Payer: BLUE CROSS/BLUE SHIELD

## 2016-09-13 ENCOUNTER — Ambulatory Visit (HOSPITAL_BASED_OUTPATIENT_CLINIC_OR_DEPARTMENT_OTHER)
Admission: RE | Admit: 2016-09-13 | Discharge: 2016-09-13 | Disposition: A | Discharge status: Home / Self Care | Payer: BLUE CROSS/BLUE SHIELD | Source: Ambulatory Visit | Attending: Obstetrics & Gynecology | Admitting: Obstetrics & Gynecology

## 2016-09-13 ENCOUNTER — Encounter (HOSPITAL_BASED_OUTPATIENT_CLINIC_OR_DEPARTMENT_OTHER): Payer: Self-pay

## 2016-09-13 ENCOUNTER — Other Ambulatory Visit: Payer: Self-pay | Admitting: Obstetrics & Gynecology

## 2016-09-13 ENCOUNTER — Ambulatory Visit: Payer: BLUE CROSS/BLUE SHIELD | Admitting: Obstetrics & Gynecology

## 2016-09-13 DIAGNOSIS — N939 Abnormal uterine and vaginal bleeding, unspecified: Secondary | ICD-10-CM

## 2016-09-13 DIAGNOSIS — D259 Leiomyoma of uterus, unspecified: Secondary | ICD-10-CM

## 2016-09-13 NOTE — Telephone Encounter (Signed)
returned call to pt. She has an appointment today and is getting her [place] right now.  Dr. Ihor Dow

## 2016-09-20 ENCOUNTER — Other Ambulatory Visit: Payer: Self-pay | Admitting: Obstetrics & Gynecology

## 2016-09-20 DIAGNOSIS — N939 Abnormal uterine and vaginal bleeding, unspecified: Secondary | ICD-10-CM

## 2016-09-20 MED ORDER — MEGESTROL ACETATE 40 MG PO TABS: 40.0000 mg | ORAL_TABLET | Freq: Two times a day (BID) | ORAL | 5 refills | Status: DC

## 2016-10-01 ENCOUNTER — Ambulatory Visit: Payer: BLUE CROSS/BLUE SHIELD | Admitting: Obstetrics & Gynecology

## 2016-10-01 ENCOUNTER — Other Ambulatory Visit: Payer: BLUE CROSS/BLUE SHIELD

## 2016-10-11 ENCOUNTER — Encounter: Payer: Self-pay | Admitting: Gastroenterology

## 2016-10-15 ENCOUNTER — Ambulatory Visit: Payer: BLUE CROSS/BLUE SHIELD | Admitting: Obstetrics & Gynecology

## 2016-10-21 ENCOUNTER — Ambulatory Visit: Payer: BLUE CROSS/BLUE SHIELD | Admitting: Cardiovascular Disease

## 2016-10-27 ENCOUNTER — Ambulatory Visit: Payer: BLUE CROSS/BLUE SHIELD | Admitting: Obstetrics & Gynecology

## 2016-11-15 ENCOUNTER — Telehealth: Payer: Self-pay | Admitting: Family Medicine

## 2016-11-15 NOTE — Telephone Encounter (Signed)
Relation to HD:QQIW Call back [phone]   Reason for call:  Patient inquiring if referral is needed for psychologist / behavior health please advise

## 2016-11-17 NOTE — Telephone Encounter (Signed)
Patient informed and has an appointment scheduled.

## 2016-11-17 NOTE — Telephone Encounter (Signed)
No, referral is not needed. Please let her know

## 2016-12-10 ENCOUNTER — Ambulatory Visit (AMBULATORY_SURGERY_CENTER): Payer: Self-pay | Admitting: *Deleted

## 2016-12-10 VITALS — Ht 67.0 in | Wt 259.0 lb

## 2016-12-10 DIAGNOSIS — Z1211 Encounter for screening for malignant neoplasm of colon: Secondary | ICD-10-CM

## 2016-12-10 MED ORDER — NA SULFATE-K SULFATE-MG SULF 17.5-3.13-1.6 GM/177ML PO SOLN
1.0000 [IU] | Freq: Once | ORAL | 0 refills | Status: AC
Start: 2016-12-10 — End: 2016-12-10

## 2016-12-10 NOTE — Progress Notes (Signed)
No egg or soy allergy known to patient  No issues with past sedation with any surgeries  or procedures, no intubation problems  No diet pills per patient No home 02 use per patient  No blood thinners per patient  Pt denies issues with constipation  No A fib or A flutter  EMMI video sent to pt's e mail  

## 2016-12-13 ENCOUNTER — Encounter: Payer: Self-pay | Admitting: Gastroenterology

## 2016-12-13 ENCOUNTER — Telehealth: Payer: Self-pay | Admitting: Gastroenterology

## 2016-12-13 NOTE — Telephone Encounter (Signed)
Called pt.  Advised on Miralax/Dulcolax prep.  Copy faxed to pharmacy and left at 4th floor receptionist desk. Angela/PV

## 2016-12-16 ENCOUNTER — Ambulatory Visit: Payer: BLUE CROSS/BLUE SHIELD | Admitting: Psychiatry

## 2016-12-24 ENCOUNTER — Encounter: Payer: BLUE CROSS/BLUE SHIELD | Admitting: Gastroenterology

## 2016-12-29 ENCOUNTER — Telehealth: Payer: Self-pay | Admitting: Family Medicine

## 2016-12-29 NOTE — Telephone Encounter (Signed)
Relation to LK:GMWN  Call back number:[phone] Pharmacy: [place], [place] [phone] (Phone) [phone] (Fax)     Reason for call:  Patient would like to know if she can take zzzquil otc active ingredient diphenhydramine with her current medication, please advise

## 2016-12-30 ENCOUNTER — Encounter: Payer: Self-pay | Admitting: Family Medicine

## 2016-12-30 NOTE — Telephone Encounter (Signed)
Pt called in to follow up on question sent to provider.    Please advise.

## 2017-01-18 ENCOUNTER — Telehealth: Payer: Self-pay | Admitting: Family Medicine

## 2017-01-18 ENCOUNTER — Encounter: Payer: Self-pay | Admitting: Family Medicine

## 2017-01-18 ENCOUNTER — Ambulatory Visit (INDEPENDENT_AMBULATORY_CARE_PROVIDER_SITE_OTHER): Payer: BLUE CROSS/BLUE SHIELD | Admitting: Family Medicine

## 2017-01-18 VITALS — BP 149/95 | HR 87

## 2017-01-18 DIAGNOSIS — I10 Essential (primary) hypertension: Secondary | ICD-10-CM

## 2017-01-18 NOTE — Progress Notes (Signed)
Pre visit review using our clinic tool,if applicable. No additional management support is needed unless otherwise documented below in the visit note.   Patient states she became dizzy on the job yesterday and BP was 225/128 Patient states she has not been taking medication as ordered. Was told to go to ED but did not. Patient went home and took BP again and it was 180/101.  Patient re-started BP medication. Called in today to be placed on Nurse sched/ule for BP check.  BP today = 149/95 P=87  Per Dr. Charlett Blake patient to taking medications as ordered.Cal in Thursday with BP recordings.     If she starts to feel bad again she needs to go to ED or scheduled an appointment with this office.  Patient agreed. Will see Dr. Lorelei Pont on October 1st  Or sooner if needed for follow up appointment.   Nurse blood pressure check note reviewed. Agree with documention and plan.

## 2017-01-18 NOTE — Telephone Encounter (Signed)
Patient called back and stated she will like to take the offer to come in to be seen. She is scheduled to see the Nurse this morning, Dr Charlett Blake will follow.   PC

## 2017-01-18 NOTE — Telephone Encounter (Signed)
Patient in and bp noted to be 145/95. She just restarted her Carvedilol and Losartan yesterday. Check blood pressure daily and let us know the numbers over the next week, if any symptoms then present for further evaluation at office or ER

## 2017-01-18 NOTE — Telephone Encounter (Signed)
Spoke with patient she stated she had not been taking her medication as prescribed, she was feeling dizzy,  she felt heart palpitations, took her blood pressure it was 225/128. She then took her losartan checked a few hours later check her pressure and it was going down 180/104. She stated she felt ok at the present moment, and did not feel like she was symptomatic.    Per Dr Charlett Blake: Patient needed to be seen by Nurse or Physician in the office today or within 24 hours. Patient declined both. Spoke with Dr. Charlett Blake again she stated patient needed to continue on medications as prescribed and follow up with her blood pressure readings over the next 2 days to her PCP. She was also advised, if she has any symptoms (chest pain, dizziness, heart palpitations, confusion, etc), she needed to be seen immediately.  She stated she would continue on taking her medication as prescribed and if she felt any symptoms she would go to the nearest ER.

## 2017-01-18 NOTE — Telephone Encounter (Signed)
Pt states BP 221/128. Pt states she had not been taking the meds prescribed and will start taking it now. Should she be seen should she increase dosage to get it to come down? Pt prefers a ph call. Call pt [phone]. Sending to DOD Dr Charlett Blake for review.

## 2017-01-18 NOTE — Telephone Encounter (Signed)
Called patient 2 times and left message for patient to call the office back.   PC

## 2017-01-28 NOTE — Progress Notes (Addendum)
[facility] [address], [place], [place] [address] [phone]  Date:  01/31/2017   Name:  Christina [name]   DOB:  12-09-1965   MRN:  [id number]  PCP:  Darreld Mclean, MD    Chief Complaint: Blood Pressure Check (with chest pains) and Bleeding/Bruising (New onset bruising)   History of Present Illness:  Christina [name] is a 51 y.o. very pleasant female patient who presents with the following:  History of HTN, obesity Here today with concern of hypertension She had called in on 9/18 with concern of elevated BP, she was not taking her medication on a regular basis - she has since restarted her BP meds and her BP does look better I last saw her in March of this year  Pt notes that her BP was 215/ 125 at work about 2 weeks ago- this got her worried.  She came here and her BP med was a bit better.   She is back taking her losartan and carvedilol  She also notes that she is having chest pain, "a dull pain that starts in my back, it comes through my esophagus and then up into my gums."  She has noted this for about 2 years, but it is becoming more frequent and lasting longer.   This is the same sort of pain for which she saw cardiology earlier this year.  At that time her CP was thought to be quite atypical so no stress testing was pursued.   She LAST noted the chest pain last week- no current acute CP The pain occurs generally at rest, not with exertion.   She has never tried taking any medication when she has the sx.  She has the impression that sx may be due to reflux but she has not tried any tums, etc for the sx  She also notes easy busing for a year or so- mostly on her arms or legs, she will not be aware of any inciting trauma She is not having any nose bleeds or blood in her urine  She is not on any anticoagulation   Patient Active Problem List   Diagnosis Date Noted  . Obesity 07/08/2016  . Essential hypertension  07/08/2016  . Pre-diabetes 06/24/2016  . Uterine fibroid   . Vitamin D deficiency   . Atypical chest pain     Past Medical History:  Diagnosis Date  . Chest pain   . Essential hypertension 07/08/2016  . Uterine fibroid   . Vitamin D deficiency     Past Surgical History:  Procedure Laterality Date  . lump removed from breast at age 1    . ovarian cyst removed at 51yr old      Social History  Substance Use Topics  . Smoking status: Never Smoker  . Smokeless tobacco: Never Used  . Alcohol use No    Family History  Problem Relation Age of Onset  . Hypertension Mother   . Dementia Father   . Colon polyps Neg Hx   . Crohn's disease Neg Hx   . Rectal cancer Neg Hx   . Stomach cancer Neg Hx     Allergies  Allergen Reactions  . Lisinopril     cough    Medication list has been reviewed and updated.  Current Outpatient Prescriptions on File Prior to Visit  Medication Sig Dispense Refill  . losartan (COZAAR) 50 MG tablet Take 1 tablet (50 mg total)  by mouth daily. 90 tablet 3  . megestrol (MEGACE) 40 MG tablet Take 1 tablet (40 mg total) by mouth 2 (two) times daily. Can increase to two tablets twice a day in the event of heavy bleeding 60 tablet 5  . carvedilol (COREG) 3.125 MG tablet Take 1 tablet (3.125 mg total) by mouth 2 (two) times daily. 60 tablet 11   No current facility-administered medications on file prior to visit.     Review of Systems:  aortic stenosis   Physical Examination: Vitals:   01/31/17 1536 01/31/17 1541  BP: (!) 152/89 (!) 151/88  Pulse: 89   Temp: 98.7 F (37.1 C)   SpO2: 98%    Vitals:   01/31/17 1536  Weight: 257 lb 9.6 oz (116.8 kg)  Height: 5\' 7"  (1.702 m)   Body mass index is 40.35 kg/m. Ideal Body Weight: Weight in (lb) to have BMI = 25: 159.3  GEN: WDWN, NAD, Non-toxic, A & O x 3, obese, looks well HEENT: Atraumatic, Normocephalic. Neck supple. No masses, No LAD.  Bilateral TM wnl, oropharynx normal.  PEERL,EOMI.    Ears and Nose: No external deformity. CV: RRR, No M/G/R. No JVD. No thrill. No extra heart sounds. PULM: CTA B, no wheezes, crackles, rhonchi. No retractions. No resp. distress. No accessory muscle use. ABD: S, NT, ND EXTR: No c/c/e NEURO Normal gait.  PSYCH: Normally interactive. Conversant. Not depressed or anxious appearing.  Calm demeanor.  Pt states no bruises are visible now  EKG: compared with tracing from last year.  SR, no acute significant changes noted.  No ST elevation  Assessment and Plan: Chest pain, unspecified type - Plan: EKG 12-Lead, Troponin I  Easy bruising - Plan: CBC, Pathologist smear review, Comprehensive metabolic panel  Essential hypertension - Plan: losartan (COZAAR) 100 MG tablet  Need for immunization against influenza - Plan: Flu Vaccine QUAD 6+ mos PF IM (Fluarix Quad PF)  Here today with persistent episodic CP for 2 years Also complaint of easy bruising  Labs pending as above- I ordered a stat troponin, but it was not collected in time to send to [facility].  Called pt back and offered to re-draw to [facility] to [facility] (need a different tube)- pt declined, we will send the blood out in the morning  Increased her losartan dose to 100 mg, continue carvedilol Plan a stress test to risk stratify her in the setting of atypical CP  Will plan further follow- up pending labs.   Signed Lamar Blinks, MD 10/2 Touched base with Dr. Acie Fredrickson about what type of stress or order for her- they are going to arrange cardiology follow-up in the clinic.  LMOM with pt letting her know to expect a call Received her labs- troponin is negative.    Results for orders placed or performed in visit on 01/31/17  CBC  Result Value Ref Range   WBC 5.7 4.0 - 10.5 K/uL   RBC 4.21 3.87 - 5.11 Mil/uL   Platelets 264.0 150.0 - 400.0 K/uL   Hemoglobin 12.3 12.0 - 15.0 g/dL   HCT 37.5 36.0 - 46.0 %   MCV 88.9 78.0 - 100.0 fl   MCHC 32.8 30.0 - 36.0 g/dL   RDW 16.1 (H) 11.5 -  15.5 %  Pathologist smear review  Result Value Ref Range   Path Review    Comprehensive metabolic panel  Result Value Ref Range   Sodium 138 135 - 145 mEq/L   Potassium 4.0 3.5 - 5.1 mEq/L  Chloride 105 96 - 112 mEq/L   CO2 29 19 - 32 mEq/L   Glucose, Bld 89 70 - 99 mg/dL   BUN 13 6 - 23 mg/dL   Creatinine, Ser 0.86 0.40 - 1.20 mg/dL   Total Bilirubin 0.3 0.2 - 1.2 mg/dL   Alkaline Phosphatase 76 39 - 117 U/L   AST 11 0 - 37 U/L   ALT 8 0 - 35 U/L   Total Protein 6.8 6.0 - 8.3 g/dL   Albumin 3.5 3.5 - 5.2 g/dL   Calcium 8.9 8.4 - 10.5 mg/dL   GFR 88.77 >60.00 mL/min  Troponin I  Result Value Ref Range   TNIDX 0.01 0.00 - 0.06 ug/l

## 2017-01-31 ENCOUNTER — Encounter: Payer: Self-pay | Admitting: Family Medicine

## 2017-01-31 ENCOUNTER — Ambulatory Visit (INDEPENDENT_AMBULATORY_CARE_PROVIDER_SITE_OTHER): Payer: BLUE CROSS/BLUE SHIELD | Admitting: Family Medicine

## 2017-01-31 VITALS — BP 151/88 | HR 89 | Temp 98.7°F | Ht 67.0 in | Wt 257.6 lb

## 2017-01-31 DIAGNOSIS — Z23 Encounter for immunization: Secondary | ICD-10-CM | POA: Diagnosis not present

## 2017-01-31 DIAGNOSIS — R233 Spontaneous ecchymoses: Secondary | ICD-10-CM

## 2017-01-31 DIAGNOSIS — I10 Essential (primary) hypertension: Secondary | ICD-10-CM

## 2017-01-31 DIAGNOSIS — R238 Other skin changes: Secondary | ICD-10-CM

## 2017-01-31 DIAGNOSIS — R079 Chest pain, unspecified: Secondary | ICD-10-CM | POA: Diagnosis not present

## 2017-01-31 MED ORDER — LOSARTAN POTASSIUM 100 MG PO TABS: 100.0000 mg | ORAL_TABLET | Freq: Every day | ORAL | 3 refills | Status: DC

## 2017-01-31 NOTE — Patient Instructions (Signed)
I will be in touch with your blood test results asap We will look for any cause of your unusual bruising!  I am going to set you up for a stress test due to your chest pain  We will also increase your losartan to 100 mg once a day to bring your blood pressure under better control  Please see me in about 2 months to repeat your blood pressure testing

## 2017-02-01 LAB — PATHOLOGIST SMEAR REVIEW

## 2017-02-01 LAB — COMPREHENSIVE METABOLIC PANEL
ALT: 8 U/L (ref 0–35)
AST: 11 U/L (ref 0–37)
Albumin: 3.5 g/dL (ref 3.5–5.2)
Alkaline Phosphatase: 76 U/L (ref 39–117)
BUN: 13 mg/dL (ref 6–23)
CO2: 29 mEq/L (ref 19–32)
Calcium: 8.9 mg/dL (ref 8.4–10.5)
Chloride: 105 mEq/L (ref 96–112)
Creatinine, Ser: 0.86 mg/dL (ref 0.40–1.20)
GFR: 88.77 mL/min (ref >60.00)
Glucose, Bld: 89 mg/dL (ref 70–99)
Potassium: 4 mEq/L (ref 3.5–5.1)
Sodium: 138 mEq/L (ref 135–145)
Total Bilirubin: 0.3 mg/dL (ref 0.2–1.2)
Total Protein: 6.8 g/dL (ref 6.0–8.3)

## 2017-02-01 LAB — CBC
HCT: 37.5 % (ref 36.0–46.0)
Hemoglobin: 12.3 g/dL (ref 12.0–15.0)
MCHC: 32.8 g/dL (ref 30.0–36.0)
MCV: 88.9 fl (ref 78.0–100.0)
Platelets: 264 10*3/uL (ref 150.0–400.0)
RBC: 4.21 Mil/uL (ref 3.87–5.11)
RDW: 16.1 % — ABNORMAL HIGH (ref 11.5–15.5)
WBC: 5.7 10*3/uL (ref 4.0–10.5)

## 2017-02-01 LAB — TROPONIN I: TNIDX: 0.01 ug/l (ref 0.00–0.06)

## 2017-02-02 ENCOUNTER — Telehealth: Payer: Self-pay | Admitting: Nurse Practitioner

## 2017-02-02 NOTE — Telephone Encounter (Signed)
Called patient to schedule an appointment to follow-up on chest pain per Dr. Acie Fredrickson. He received a message from patient's PCP, Dr. Lorelei Pont, requesting advice regarding patient's continued complaint of chest pain. I asked if patient would be available to see Dr. Acie Fredrickson on Tues. Oct. 9 at 1:00 and advised her to call back.

## 2017-02-03 NOTE — Telephone Encounter (Signed)
Patient agrees to come in for office visit on Tuesday Oct. 9 at 1:00 pm.

## 2017-02-07 ENCOUNTER — Encounter: Payer: Self-pay | Admitting: Family Medicine

## 2017-02-07 ENCOUNTER — Telehealth: Payer: Self-pay | Admitting: Family Medicine

## 2017-02-07 NOTE — Telephone Encounter (Signed)
Caller name: Relation to BM:ZTAE Call back number: [phone] Pharmacy:  Reason for call: pt received her lab results on my chart, and would like for Dr. Lorelei Pont to give her a call states she has questions regarding some of her labs

## 2017-02-08 ENCOUNTER — Encounter: Payer: Self-pay | Admitting: Cardiovascular Disease

## 2017-02-08 ENCOUNTER — Ambulatory Visit (INDEPENDENT_AMBULATORY_CARE_PROVIDER_SITE_OTHER): Payer: BLUE CROSS/BLUE SHIELD | Admitting: Cardiovascular Disease

## 2017-02-08 ENCOUNTER — Encounter: Payer: Self-pay | Admitting: Family Medicine

## 2017-02-08 VITALS — BP 144/76 | HR 84 | Ht 67.0 in | Wt 258.8 lb

## 2017-02-08 DIAGNOSIS — R079 Chest pain, unspecified: Secondary | ICD-10-CM | POA: Diagnosis not present

## 2017-02-08 DIAGNOSIS — R238 Other skin changes: Secondary | ICD-10-CM

## 2017-02-08 DIAGNOSIS — R233 Spontaneous ecchymoses: Secondary | ICD-10-CM

## 2017-02-08 MED ORDER — CARVEDILOL 6.25 MG PO TABS: 6.2500 mg | ORAL_TABLET | Freq: Two times a day (BID) | ORAL | 3 refills | Status: DC

## 2017-02-08 MED ORDER — NITROGLYCERIN 0.4 MG SL SUBL: 0.4000 mg | SUBLINGUAL_TABLET | SUBLINGUAL | 6 refills | Status: DC | PRN

## 2017-02-08 NOTE — Progress Notes (Signed)
Cardiology Office Note   Date:  02/08/2017   ID:  Christina [name], DOB 1966-01-02, MRN [id number]  PCP:  Darreld Mclean, MD  Cardiologist:   Mertie Moores, MD   Chief Complaint  Patient presents with  . Chest Pain   Problem List 1. Chest pain  2. HTN    History of Present Illness:  Christina [name] is a 51 y.o. female who is being seen today for the evaluation of chest pain  at the request of Copland, Gay Filler, MD.  Has been having CP for the past year.  Tightness.  Lasts for a minute or so.  Not related to exertion, eating , drinking, change of position. Not pleuretic.    Resolves spontaneously after several minutes  Hx of HTN - was on Lisinopril but this caused a cough.   Makes no effort to avoid salt .  Discussed salty foods at length  Does not exercise  Works at [employer]  These episodes have been going in for several years     Has palpitations  - clinically sounds like PVCs  Oct. 9, 2018:  Christina [name] is seen back today for followup of recurrent CP She was seen about a year ago for CP .  Was thought to be atypical . She was seen at her primary MD and has continued to have CP.    Dr. Edilia Bo sent me a message requesting that we see Christina [name] again for continued CP .  Still having cp .  The last episode started while she was at a meeting at work .  They last several minutes,  Starts mid sternal , dull pain . Gets stronger. Radiates up to her jaw,  Stops at her gums. Radiates through to her back  Slowly supsides and goes away .  May last for 10 min. Is not exercising No diaphoresis, no presyncope Does not feel like refulx  .      Past Medical History:  Diagnosis Date  . Chest pain   . Essential hypertension 07/08/2016  . Uterine fibroid   . Vitamin D deficiency     Past Surgical History:  Procedure Laterality Date  . lump removed from breast at age 64    . ovarian cyst removed at 51yr old       Current Outpatient Prescriptions    Medication Sig Dispense Refill  . carvedilol (COREG) 3.125 MG tablet Take 1 tablet (3.125 mg total) by mouth 2 (two) times daily. 60 tablet 11  . losartan (COZAAR) 100 MG tablet Take 1 tablet (100 mg total) by mouth daily. 90 tablet 3  . megestrol (MEGACE) 40 MG tablet Take 1 tablet (40 mg total) by mouth 2 (two) times daily. Can increase to two tablets twice a day in the event of heavy bleeding 60 tablet 5   No current facility-administered medications for this visit.     PAD Screen 09/06/2016  Previous PAD dx? No  Previous surgical procedure? No  Pain with walking? Yes  Subsides with rest? Yes  Feet/toe relief with dangling? Yes  Painful, non-healing ulcers? No  Extremities discolored? No      Allergies:   Lisinopril    Social History:  The patient  reports that she has never smoked. She has never used smokeless tobacco. She reports that she does not drink alcohol or use drugs.   Family History:  The patient's family history includes Dementia in her father; Hypertension in her mother.    ROS:  Please see  the history of present illness.    Review of Systems: Constitutional:  denies fever, chills, diaphoresis, appetite change and fatigue.  HEENT: denies photophobia, eye pain, redness, hearing loss, ear pain, congestion, sore throat, rhinorrhea, sneezing, neck pain, neck stiffness and tinnitus.  Respiratory: denies SOB, DOE, cough, chest tightness, and wheezing.  Cardiovascular: admits to chest pain, palpitations .  Denies leg swelling.  Gastrointestinal: denies nausea, vomiting, abdominal pain, diarrhea, constipation, blood in stool.  Genitourinary: denies dysuria, urgency, frequency, hematuria, flank pain and difficulty urinating.  Musculoskeletal: denies  myalgias, back pain, joint swelling, arthralgias and gait problem.   Skin: denies pallor, rash and wound.  Neurological: denies dizziness, seizures, syncope, weakness, light-headedness, numbness and headaches.    Hematological: denies adenopathy, easy bruising, personal or family bleeding history.  Psychiatric/ Behavioral: denies suicidal ideation, mood changes, confusion, nervousness, sleep disturbance and agitation.       All other systems are reviewed and negative.    Physical Exam: Last menstrual period 01/31/2017.  GEN:  Obese, middle age black  female,   HEENT: Normal NECK: No JVD; No carotid bruits LYMPHATICS: No lymphadenopathy CARDIAC: RR, no murmurs, rubs, gallops, chest wall is nontender  RESPIRATORY:  Clear to auscultation without rales, wheezing or rhonchi  ABDOMEN: Soft, non-tender, non-distended MUSCULOSKELETAL:  No edema; No deformity  SKIN: Warm and dry NEUROLOGIC:  Alert and oriented x 3   EKG:  EKG is ordered 01/31/17 NSR at 78.  No ST or T wave changes.     Recent Labs: 06/23/2016: TSH 0.42 01/31/2017: ALT 8; BUN 13; Creatinine, Ser 0.86; Hemoglobin 12.3; Platelets 264.0; Potassium 4.0; Sodium 138    Lipid Panel    Component Value Date/Time   CHOL 179 06/23/2016 1516   TRIG 93.0 06/23/2016 1516   HDL 66.70 06/23/2016 1516   CHOLHDL 3 06/23/2016 1516   VLDL 18.6 06/23/2016 1516   LDLCALC 94 06/23/2016 1516      Wt Readings from Last 3 Encounters:  01/31/17 257 lb 9.6 oz (116.8 kg)  12/10/16 259 lb (117.5 kg)  09/06/16 259 lb 3.2 oz (117.6 kg)      Other studies Reviewed: Additional studies/ records that were reviewed today include: . Review of the above records demonstrates:    ASSESSMENT AND PLAN:  1.  Chest pain:  Come concerning features.  Chest pressure .    2. Essential hypertension:   She is on coreg 3.125 BID , BP is mildly elevated today .  Will increase her dose to 6.25 BID  Advised her to lose weight , watch her diet   3.  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  Sudden, isolated palpitations that cause her to cough and then are completely resolved.  We discussed the benign nature of these.   4. Obesity:   She  needs to lose some weight   Current medicines are reviewed at length with the patient today.  The patient does not have concerns regarding medicines.  Labs/ tests ordered today include:  No orders of the defined types were placed in this encounter.   Disposition:   FU with me in 3 months       Mertie Moores, MD  02/08/2017 1:05 PM    [facility] [place], [place], [place]  [address] Phone: [phone]; Fax: [phone]

## 2017-02-08 NOTE — Telephone Encounter (Signed)
Called her and [facility]- sorry I missed her, might be easiest to connect over mychart if she would be able to send me her question there? Also I did speak with pathologist at [facility], her peripheral smear is in normal range

## 2017-02-08 NOTE — Patient Instructions (Addendum)
Medication Instructions:  INCREASE Carvedilol (Coreg) to 6.25 mg twice daily  TAKE NTG (Nitroglycerin 0.4 mg) - place one under your tongue for chest pain; you may repeat in 5 min x 2 Call 911 if pain/discomfort persists   Labwork: Your physician recommends that you return for lab work in: 1 week before your Coronary CT for Basic metabolic panel   Testing/Procedures: Please arrive at the [facility] main entrance of [facility] at xx:xx AM (30-45 minutes prior to test start time)  [facility] [address] [place], [place] [address] [phone]  Proceed to the [facility] Radiology Department (First Floor).  Please follow these instructions carefully (unless otherwise directed):  Hold all erectile dysfunction medications at least 48 hours prior to test.  On the Night Before the Test: . Drink plenty of water. . Do not consume any caffeinated/decaffeinated beverages or chocolate 12 hours prior to your test. . Do not take any antihistamines 12 hours prior to your test. . If you take Metformin do not take 24 hours prior to test. . If the patient has contrast allergy: ? Patient will need a prescription for Prednisone and very clear instructions (as follows): 1. Prednisone 50 mg - take 13 hours prior to test 2. Take another Prednisone 50 mg 7 hours prior to test 3. Take another Prednisone 50 mg 1 hour prior to test 4. Take Benadryl 50 mg 1 hour prior to test . Patient must complete all four doses of above prophylactic medications. . Patient will need a ride after test due to Benadryl.  On the Day of the Test: . Drink plenty of water. Do not drink any water within one hour of the test. . Do not eat any food 4 hours prior to the test. . You may take your regular medications prior to the test. . IF NOT ON A BETA BLOCKER - Take 50 mg of lopressor (metoprolol) one hour before the test. . HOLD Furosemide morning of the test.  After the Test: . Drink plenty of  water. . After receiving IV contrast, you may experience a mild flushed feeling. This is normal. . On occasion, you may experience a mild rash up to 24 hours after the test. This is not dangerous. If this occurs, you can take Benadryl 25 mg and increase your fluid intake. . If you experience trouble breathing, this can be serious. If it is severe call 911 IMMEDIATELY. If it is mild, please call our office. . If you take any of these medications: Glipizide/Metformin, Avandament, Glucavance, please do not take 48 hours after completing test.   Follow-Up: Your physician wants you to follow-up in: 3 months with Dr. Acie Fredrickson.  You will receive a reminder letter in the mail two months in advance. If you don't receive a letter, please call our office to schedule the follow-up appointment.   If you need a refill on your cardiac medications before your next appointment, please call your pharmacy.   Thank you for choosing [facility]! Christen Bame, RN [phone]

## 2017-02-11 ENCOUNTER — Encounter: Payer: Self-pay | Admitting: Cardiology

## 2017-02-11 ENCOUNTER — Encounter: Payer: Self-pay | Admitting: Cardiovascular Disease

## 2017-03-04 ENCOUNTER — Ambulatory Visit (HOSPITAL_COMMUNITY)
Admission: RE | Admit: 2017-03-04 | Discharge: 2017-03-04 | Disposition: A | Discharge status: Home / Self Care | Payer: BLUE CROSS/BLUE SHIELD | Source: Ambulatory Visit | Attending: Cardiovascular Disease | Admitting: Cardiovascular Disease

## 2017-03-04 ENCOUNTER — Encounter (HOSPITAL_COMMUNITY): Payer: Self-pay

## 2017-03-04 ENCOUNTER — Other Ambulatory Visit: Payer: Self-pay | Admitting: Cardiovascular Disease

## 2017-03-04 DIAGNOSIS — I288 Other diseases of pulmonary vessels: Secondary | ICD-10-CM | POA: Diagnosis not present

## 2017-03-04 DIAGNOSIS — R079 Chest pain, unspecified: Secondary | ICD-10-CM

## 2017-03-04 MED ORDER — NITROGLYCERIN 0.4 MG SL SUBL
0.8000 mg | SUBLINGUAL_TABLET | Freq: Once | SUBLINGUAL | Status: AC
  Administered 2017-03-04: 0.8 mg via SUBLINGUAL

## 2017-03-04 MED ORDER — METOPROLOL TARTRATE 5 MG/5ML IV SOLN
5.0000 mg | INTRAVENOUS | Status: DC | PRN
  Administered 2017-03-04 (×3): 5 mg via INTRAVENOUS

## 2017-03-04 MED ORDER — IOPAMIDOL (ISOVUE-370) INJECTION 76%
80.0000 mL | Freq: Once | INTRAVENOUS | Status: AC | PRN
  Administered 2017-03-04: 80 mL via INTRAVENOUS

## 2017-03-04 MED ORDER — IOPAMIDOL (ISOVUE-370) INJECTION 76%
INTRAVENOUS | Status: AC
  Filled 2017-03-04: qty 100

## 2017-03-04 MED ORDER — NITROGLYCERIN 0.4 MG SL SUBL
SUBLINGUAL_TABLET | SUBLINGUAL | Status: DC
Start: 2017-03-04 — End: 2017-03-05
  Filled 2017-03-04: qty 2

## 2017-03-04 MED ORDER — METOPROLOL TARTRATE 5 MG/5ML IV SOLN
INTRAVENOUS | Status: AC
  Filled 2017-03-04: qty 15

## 2017-03-08 ENCOUNTER — Telehealth: Payer: Self-pay | Admitting: Nurse Practitioner

## 2017-03-08 DIAGNOSIS — I272 Pulmonary hypertension, unspecified: Secondary | ICD-10-CM

## 2017-03-08 MED ORDER — POTASSIUM CHLORIDE CRYS ER 20 MEQ PO TBCR: 20.0000 meq | EXTENDED_RELEASE_TABLET | Freq: Every day | ORAL | 3 refills | Status: DC

## 2017-03-08 MED ORDER — FUROSEMIDE 20 MG PO TABS: 40.0000 mg | ORAL_TABLET | Freq: Every day | ORAL | 3 refills | Status: DC

## 2017-03-08 NOTE — Telephone Encounter (Signed)
Reviewed results of coronary CT and plan of care with patient. She verbalized understanding and agreement to start furosemide and kdur and is aware that someone will call her to schedule her echo. I advised her that we will schedule a follow-up appointment based on results of echo. I advised her to call me with questions or concerns prior to f/u. She verbalized understanding and thanked me for the call.

## 2017-03-08 NOTE — Telephone Encounter (Signed)
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

## 2017-03-09 ENCOUNTER — Telehealth: Payer: Self-pay | Admitting: Cardiovascular Disease

## 2017-03-09 NOTE — Telephone Encounter (Signed)
Spoke with patient who states she is on the phone for most of her work day and wonders if there is an alternative to taking lasix. I advised that she needs diuretic therapy but we could possibly try a weaker diuretic. I advised an alternative to her dosing and asked her to try taking lasix 20 mg first thing in the morning and then the 2nd dose of 20 mg around lunchtime. I advised her to call me back to report the effectiveness of this therapy. She verbalized understanding and agreement and thanked me for the call.

## 2017-03-09 NOTE — Telephone Encounter (Signed)
New message   Patient calling  Pt c/o medication issue:  1. Name of Medication:furosemide (LASIX) 20 MG tablet   2. How are you currently taking this medication (dosage and times per day)? Take 2 tablets (40 mg total) daily by mouth.  3. Are you having a reaction (difficulty breathing--STAT)? No   4. What is your medication issue?  need to discuss medication with nurse.

## 2017-03-15 NOTE — Telephone Encounter (Signed)
New Message     Pt c/o medication issue:  1. Name of Medication:  Losartan   2. How are you currently taking this medication (dosage and times per day)?  1x a day   3. Are you having a reaction (difficulty breathing--STAT)?  no  4. What is your medication issue? There is a recall on the internet

## 2017-03-15 NOTE — Telephone Encounter (Signed)
Pt needs to contact her retail pharmacy to see if the manufacturer of losartan that he receives has been affected by the recall - many manufacturers are still fine.

## 2017-03-15 NOTE — Telephone Encounter (Signed)
Called and left detailed message, ok per DPR.  Left message advising to contact pharmacy and see if her particular batch was recalled.  Advised if it was, to call back and let us know.

## 2017-03-15 NOTE — Telephone Encounter (Signed)
Will route to Dr. Acie Fredrickson and [facility] to see about alternative for Losartan since this has been recalled now.

## 2017-03-17 ENCOUNTER — Ambulatory Visit (HOSPITAL_COMMUNITY): Payer: BLUE CROSS/BLUE SHIELD | Attending: Cardiology

## 2017-03-17 ENCOUNTER — Other Ambulatory Visit: Payer: Self-pay

## 2017-03-17 DIAGNOSIS — I071 Rheumatic tricuspid insufficiency: Secondary | ICD-10-CM | POA: Insufficient documentation

## 2017-03-17 DIAGNOSIS — I272 Pulmonary hypertension, unspecified: Secondary | ICD-10-CM

## 2017-03-17 DIAGNOSIS — I371 Nonrheumatic pulmonary valve insufficiency: Secondary | ICD-10-CM | POA: Diagnosis not present

## 2017-03-18 ENCOUNTER — Telehealth: Payer: Self-pay | Admitting: Family Medicine

## 2017-03-18 ENCOUNTER — Telehealth: Payer: Self-pay | Admitting: Cardiovascular Disease

## 2017-03-18 ENCOUNTER — Encounter: Payer: Self-pay | Admitting: Cardiovascular Disease

## 2017-03-18 NOTE — Telephone Encounter (Signed)
Coronary CT angiogram shows no evidence of artery disease. There was a suggestion that she might have pulmonary hypertension but this was not seen with echo. The echocardiogram shows normal left ventricular systolic function with mild diastolic dysfunction.  The pulmonary arteries were normal size.  Furthermore, the right ventricle has normal size and function.  Estimated PA pressure is 23 which is normal.  At this point I would conclude that she has noncardiac chest pain.

## 2017-03-18 NOTE — Telephone Encounter (Signed)
Pt c/o of Chest Pain: 1. Are you having CP right now? *No 2. Are you experiencing any other symptoms (ex. SOB, nausea, vomiting, sweating)? no 3. How long have you been experiencing CP? First pains-this morning at 10:00 for about 30 minutes4. Is your CP continuous or coming and going?coming and going5. Have you taken Nitroglycerin? yes

## 2017-03-18 NOTE — Telephone Encounter (Signed)
A coronary CT angiogram has been ordered.  Christina [name] will see if we can get this done sooner

## 2017-03-18 NOTE — Telephone Encounter (Signed)
Dr. Copland please advise 

## 2017-03-18 NOTE — Telephone Encounter (Signed)
CT done 03/04/17, results in Epic

## 2017-03-18 NOTE — Telephone Encounter (Signed)
Patient Name: Christina [name] DOB: October 02, 1965 Initial Comment Caller needs throat tested, chest pain. Nurse Assessment Nurse: Cherie Dark RN, Jarrett Soho Date/Time (Eastern Time): 03/18/2017 11:57:00 AM Confirm and document reason for call. If symptomatic, describe symptoms. ---Caller states she has had chest pain for forever and she was wanting to have some tests done to rule out spasms. has been having chest pain for about 5 years and has been seeing a cardiologist. has had two attacks today. Has had two nitro today but they have not helped. Does the patient have any new or worsening symptoms? ---Yes Will a triage be completed? ---Yes Related visit to physician within the last 2 weeks? ---Yes Does the PT have any chronic conditions? (i.e. diabetes, asthma, etc.) ---Yes List chronic conditions. ---HTN, possible PAD, Is the patient pregnant or possibly pregnant? (Ask all females between the ages of 46-55) ---No Is this a behavioral health or substance abuse call? ---No Guidelines Guideline Title Affirmed Question Affirmed Notes Chest Pain [1] Chest pain lasts > 5 minutes AND [2] described as crushing, pressure-like, or heavy Final Disposition User Call EMS 911 Now [place], RN, Jarrett Soho Comments spoke with the office and informed them that the patient was recommended to call 911 and she did not want to do that but wanted some tests performed in the office. Referrals GO TO FACILITY REFUSED

## 2017-03-18 NOTE — Telephone Encounter (Signed)
Jarrett Soho from [facility] called bc patient was transfer, per Jarrett Soho stts patient was adv to cal 911-. Adv that a documentation will be noted.   Jarrett Soho stts patient has been transfer several times with the same issue. Patient refused to call 911 per [facility] and wanted in office test.

## 2017-03-18 NOTE — Telephone Encounter (Signed)
Discussed results with patient, she verbalized understanding, thanked me for the call, will follow-up with Dr Lorelei Pont, her PCP,  about other non-cardiac causes for chest pain.

## 2017-03-18 NOTE — Telephone Encounter (Signed)
Called and [facility]- it looks like Dr. Acie Fredrickson called her today and they do not feel her CP is cardiac.  Invited her to see me at her convenience to discuss other possible causess

## 2017-03-18 NOTE — Telephone Encounter (Signed)
Pt states she had 3 episodes of chest pain this morning, lasting about 30 minutes each time, similar to episodes she discussed with Dr Acie Fredrickson in the past, unrelieved with NTG.  Pt had echocardiogram yesterday and is anxious to get the result because of her symptoms. Pt advised I will forward to Dr Acie Fredrickson for review and recommendations.

## 2017-03-22 ENCOUNTER — Other Ambulatory Visit: Payer: Self-pay

## 2017-03-22 ENCOUNTER — Other Ambulatory Visit (HOSPITAL_COMMUNITY): Payer: Self-pay

## 2017-03-22 ENCOUNTER — Ambulatory Visit: Payer: Self-pay | Admitting: Hematology & Oncology

## 2017-03-29 NOTE — Progress Notes (Signed)
[facility] [address] [place], [place] [address] [phone]  Date:  03/30/2017   Name:  Christina [name]   DOB:  [birth year]   MRN:  [id number]  PCP:  Darreld Mclean, MD    Chief Complaint: Chest Pain (Pt states that she has aHx of chest pains and was told to come in for testing. Pt states that the last time she had chest pain was about a week and a half ago. Pt states that the pain starts in her chest, goes to her throat and then her gums. )   History of Present Illness:  Christina [name] is a 51 y.o. very pleasant female patient who presents with the following:  History of HTN, pre-diabetes.  Here today for a follow-up visit I saw her in October with concern of chest pain, and she followed -up with cardiology (Dr. Acie Fredrickson) She underwent coronary calcium scoring Coronary CT angiogram shows no evidence of artery disease. There was a suggestion that she might have pulmonary hypertension but this was not seen with echo. The echocardiogram shows normal left ventricular systolic function with mild diastolic dysfunction.  The pulmonary arteries were normal size.  Furthermore, the right ventricle has normal size and function.  Estimated PA pressure is 23 which is normal.  Cardiology does not feel that her CP is due to cardiac disease   She notes that she continues to have episodes of CP- however it sounds like she is having RUQ pain which then radiates into her chest  Can occur at rest or with activity She will notice a dull ache under her right breast which will work it's way up her throat and into her gums This can last up to 30 minutes, but generally not that long  Eating does not bring it on, not changed by activity at all  However, she also notes a different type of discomfort in her stomach that DOES occur after eating No nausea or vomiting with this She has not tried taking tums, etc She does not have any SOB, no  difficulty breathing at all   She still does have her gallbladder  The pain may occur a few times a month  No headache, no cough  Her recent CT chest did not who any sign of lung problem She is overdue for colon cancer screening- never had- and would like to be referred to GI     Patient Active Problem List   Diagnosis Date Noted  . Obesity 07/08/2016  . Essential hypertension 07/08/2016  . Pre-diabetes 06/24/2016  . Uterine fibroid   . Vitamin D deficiency   . Atypical chest pain     Past Medical History:  Diagnosis Date  . Chest pain   . Essential hypertension 07/08/2016  . Uterine fibroid   . Vitamin D deficiency     Past Surgical History:  Procedure Laterality Date  . lump removed from breast at age 81    . ovarian cyst removed at 51yr old      Social History   Tobacco Use  . Smoking status: Never Smoker  . Smokeless tobacco: Never Used  Substance Use Topics  . Alcohol use: No  . Drug use: No    Family History  Problem Relation Age of Onset  . Hypertension Mother   . Dementia Father   . Colon polyps Neg Hx   . Crohn's disease Neg Hx   . Rectal cancer Neg  Hx   . Stomach cancer Neg Hx     Allergies  Allergen Reactions  . Lisinopril     cough    Medication list has been reviewed and updated.  Current Outpatient Medications on File Prior to Visit  Medication Sig Dispense Refill  . carvedilol (COREG) 6.25 MG tablet Take 1 tablet (6.25 mg total) by mouth 2 (two) times daily. 180 tablet 3  . furosemide (LASIX) 20 MG tablet Take 2 tablets (40 mg total) daily by mouth. 180 tablet 3  . losartan (COZAAR) 100 MG tablet Take 1 tablet (100 mg total) by mouth daily. 90 tablet 3  . megestrol (MEGACE) 40 MG tablet Take 1 tablet (40 mg total) by mouth 2 (two) times daily. Can increase to two tablets twice a day in the event of heavy bleeding 60 tablet 5  . nitroGLYCERIN (NITROSTAT) 0.4 MG SL tablet Place 1 tablet (0.4 mg total) under the tongue every 5 (five)  minutes as needed for chest pain. 25 tablet 6  . potassium chloride SA (K-DUR,KLOR-CON) 20 MEQ tablet Take 1 tablet (20 mEq total) daily by mouth. 90 tablet 3   No current facility-administered medications on file prior to visit.     Review of Systems:  As per HPI- otherwise negative.   Physical Examination: Vitals:   03/30/17 1442  BP: 138/88  Pulse: 88  Temp: 98.8 F (37.1 C)  SpO2: 97%   Vitals:   03/30/17 1442  Weight: 261 lb (118.4 kg)  Height: 5\' 7"  (1.702 m)   Body mass index is 40.88 kg/m. Ideal Body Weight: Weight in (lb) to have BMI = 25: 159.3  GEN: WDWN, NAD, Non-toxic, A & O x 3, obese, looks well HEENT: Atraumatic, Normocephalic. Neck supple. No masses, No LAD.  Bilateral TM wnl, oropharynx normal.  PEERL,EOMI.   Ears and Nose: No external deformity. CV: RRR, No M/G/R. No JVD. No thrill. No extra heart sounds. PULM: CTA B, no wheezes, crackles, rhonchi. No retractions. No resp. distress. No accessory muscle use. ABD: S, NT, ND, +BS. No rebound. No HSM. EXTR: No c/c/e NEURO Normal gait.  PSYCH: Normally interactive. Conversant. Not depressed or anxious appearing.  Calm demeanor.  Negative murphys' sign.  At this time she really only has tenderness to palpation over the most inferior right sided ribs No rash or skin lesion    Assessment and Plan: Immunization due - Plan: Tdap vaccine greater than or equal to 7yo IM  Epigastric pain - Plan: sucralfate (CARAFATE) 1 g tablet, Ambulatory referral to Gastroenterology  RUQ pain - Plan: US Abdomen Limited RUQ  Screening for colon cancer - Plan: Ambulatory referral to Gastroenterology  Here today to discuss pain in her ruq/ chest. S/p full evaluation by cardiology and no cardiac disease noted Will set her up for a RUQ [place].  Start carafate and OTC ranitidine in the meantime Updated tdap which was due Referral to GI   Signed Lamar Blinks, MD

## 2017-03-30 ENCOUNTER — Encounter: Payer: Self-pay | Admitting: Family Medicine

## 2017-03-30 ENCOUNTER — Ambulatory Visit: Payer: Self-pay | Admitting: Family Medicine

## 2017-03-30 ENCOUNTER — Ambulatory Visit (INDEPENDENT_AMBULATORY_CARE_PROVIDER_SITE_OTHER): Payer: BLUE CROSS/BLUE SHIELD | Admitting: Obstetrics & Gynecology

## 2017-03-30 ENCOUNTER — Ambulatory Visit (INDEPENDENT_AMBULATORY_CARE_PROVIDER_SITE_OTHER): Payer: BLUE CROSS/BLUE SHIELD | Admitting: Family Medicine

## 2017-03-30 VITALS — BP 138/88 | HR 88 | Temp 98.8°F | Ht 67.0 in | Wt 261.0 lb

## 2017-03-30 VITALS — BP 150/89 | HR 81 | Wt 261.0 lb

## 2017-03-30 DIAGNOSIS — Z23 Encounter for immunization: Secondary | ICD-10-CM | POA: Diagnosis not present

## 2017-03-30 DIAGNOSIS — Z1211 Encounter for screening for malignant neoplasm of colon: Secondary | ICD-10-CM

## 2017-03-30 DIAGNOSIS — N939 Abnormal uterine and vaginal bleeding, unspecified: Secondary | ICD-10-CM | POA: Diagnosis not present

## 2017-03-30 DIAGNOSIS — R1013 Epigastric pain: Secondary | ICD-10-CM

## 2017-03-30 DIAGNOSIS — D219 Benign neoplasm of connective and other soft tissue, unspecified: Secondary | ICD-10-CM

## 2017-03-30 DIAGNOSIS — R1011 Right upper quadrant pain: Secondary | ICD-10-CM | POA: Diagnosis not present

## 2017-03-30 MED ORDER — SUCRALFATE 1 G PO TABS: 1.0000 g | ORAL_TABLET | Freq: Three times a day (TID) | ORAL | 0 refills | Status: DC

## 2017-03-30 NOTE — Patient Instructions (Signed)
You got your tetanus booster today- this is good for 10 years I am going to send you for an ultrasound of your gallbladder to look for gallstones We will also refer you to see gastroenterology to discuss a colonoscopy and perhaps to look at your stomach as well In the meantime, please pick up a box of OTC ranitidine and use according to package directions.  In addition, I prescribed carafate to coat and protect your stomach. Take this before meals and before bed for 7- 10 days

## 2017-03-30 NOTE — Patient Instructions (Signed)
Endometrial Biopsy Endometrial biopsy is a procedure in which a tissue sample is taken from inside the uterus. The sample is taken from the endometrium, which is the lining of the uterus. The tissue sample is then checked under a microscope to see if the tissue is normal or abnormal. This procedure helps to determine where you are in your menstrual cycle and how hormone levels are affecting the lining of the uterus. This procedure may also be used to evaluate uterine bleeding or to diagnose endometrial cancer, endometrial tuberculosis, polyps, or other inflammatory conditions. Tell a health care provider about:  Any allergies you have.  All medicines you are taking, including vitamins, herbs, eye drops, creams, and over-the-counter medicines.  Any problems you or family members have had with anesthetic medicines.  Any blood disorders you have.  Any surgeries you have had.  Any medical conditions you have.  Whether you are pregnant or may be pregnant. What are the risks? Generally, this is a safe procedure. However, problems may occur, including:  Bleeding.  Pelvic infection.  Puncture of the wall of the uterus with the biopsy device (rare).  What happens before the procedure?  Keep a record of your menstrual cycles as told by your health care provider. You may need to schedule your procedure for a specific time in your cycle.  You may want to bring a sanitary pad to wear after the procedure.  Ask your health care provider about: ? Changing or stopping your regular medicines. This is especially important if you are taking diabetes medicines or blood thinners. ? Taking medicines such as aspirin and ibuprofen. These medicines can thin your blood. Do not take these medicines before your procedure if your health care provider instructs you not to.  Plan to have someone take you home from the hospital or clinic. What happens during the procedure?  To lower your risk of  infection: ? Your health care team will wash or sanitize their hands.  You will lie on an exam table with your feet and legs supported as in a pelvic exam.  Your health care provider will insert an instrument (speculum) into your vagina to see your cervix.  Your cervix will be cleansed with an antiseptic solution.  A medicine (local anesthetic) will be used to numb the cervix.  A forceps instrument (tenaculum) will be used to hold your cervix steady for the biopsy.  A thin, rod-like instrument (uterine sound) will be inserted through your cervix to determine the length of your uterus and the location where the biopsy sample will be removed.  A thin, flexible tube (catheter) will be inserted through your cervix and into the uterus. The catheter will be used to collect the biopsy sample from your endometrial tissue.  The catheter and speculum will then be removed, and the tissue sample will be sent to a lab for examination. What happens after the procedure?  You will rest in a recovery area until you are ready to go home.  You may have mild cramping and a small amount of vaginal bleeding. This is normal.  It is up to you to get the results of your procedure. Ask your health care provider, or the department that is doing the procedure, when your results will be ready. Summary  Endometrial biopsy is a procedure in which a tissue sample is taken from the endometrium, which is the lining of the uterus.  This procedure may help to diagnose menstrual cycle problems, abnormal bleeding, or other conditions affecting   the endometrium.  Before the procedure, keep a record of your menstrual cycles as told by your health care provider.  The tissue sample that is removed will be checked under a microscope to see if it is normal or abnormal. This information is not intended to replace advice given to you by your health care provider. Make sure you discuss any questions you have with your health care  provider. Document Released: 08/20/2004 Document Revised: 05/05/2016 Document Reviewed: 05/05/2016 Elsevier Interactive Patient Education  2017 [employer].    Hysterectomy Information A hysterectomy is a surgery to remove your uterus. After surgery, you will no longer have periods. Also, you will not be able to get pregnant. Reasons for this surgery  You have bleeding that is not normal and keeps coming back.  You have lasting (chronic) lower belly (pelvic) pain.  You have a lasting infection.  The lining of your uterus grows outside your uterus.  The lining of your uterus grows in the muscle of your uterus.  Your uterus falls down into your vagina.  You have a growth in your uterus that causes problems.  You have cells that could turn into cancer (precancerous cells).  You have cancer of the uterus or cervix. Types There are 3 types of hysterectomies. Depending on the type, the surgery will:  Remove the top part of the uterus only.  Remove the uterus and the cervix.  Remove the uterus, cervix, and tissue that holds the uterus in place in the lower belly.  Ways a hysterectomy can be performed There are 5 ways this surgery can be performed.  A cut (incision) is made in the belly (abdomen). The uterus is taken out through the cut.  A cut is made in the vagina. The uterus is taken out through the cut.  Three or four cuts are made in the belly. A surgical device with a camera is put through one of the cuts. The uterus is cut into small pieces. The uterus is taken out through the cuts or the vagina.  Three or four cuts are made in the belly. A surgical device with a camera is put through one of the cuts. The uterus is taken out through the vagina.  Three or four cuts are made in the belly. A surgical device that is controlled by a computer makes a visual image. The device helps the surgeon control the surgical tools. The uterus is cut into small pieces. The pieces are  taken out through the cuts or through the vagina.  What can I expect after the surgery?  You will be given pain medicine.  You will need help at home for 3-5 days after surgery.  You will need to see your doctor in 2-4 weeks after surgery.  You may get hot flashes, have night sweats, and have trouble sleeping.  You may need to have Pap tests in the future if your surgery was related to cancer. Talk to your doctor. It is still good to have regular exams. This information is not intended to replace advice given to you by your health care provider. Make sure you discuss any questions you have with your health care provider. Document Released: 07/12/2011 Document Revised: 09/25/2015 Document Reviewed: 12/25/2012 Elsevier Interactive Patient Education  [employer].

## 2017-03-30 NOTE — Progress Notes (Signed)
History:  51 y.o. G1P1 here today for contd AUB. Pt reports continued bleeding in spite of Megace daily. Pt reports that if she misses a pill she has cont'd bleeding.   The following portions of the patient's history were reviewed and updated as appropriate: allergies, current medications, past family history, past medical history, past social history, past surgical history and problem list.  Review of Systems:  Pertinent items are noted in HPI.   Objective:  Physical Exam Blood pressure (!) 150/89, pulse 81, weight 261 lb (118.4 kg).  CONSTITUTIONAL: Well-developed, well-nourished female in no acute distress.  HENT:  Normocephalic, atraumatic EYES: Conjunctivae and EOM are normal. No scleral icterus.  NECK: Normal range of motion SKIN: Skin is warm and dry. No rash noted. Not diaphoretic.No pallor. [place]: Alert and oriented to person, place, and time. Normal coordination.  Pelvic: pt declined   Assessment & Plan:  AUB  Large uterine fibroids  I reviewed with pt her tx options. She understands the need for definitive treatment.  I have explained to her th eneed for endometrial sampling. She declines that today. I explained to her tha tit was a relatively simple procedure but, she reports that she is not prepared today but, would follow up for it. I did explain to her tha tit was needed to r/o endo ca.  F/u in 2 weeks for Endometrial biopsy Rec TAB with BSO. I have given the pt written info about this.  Total face-to-face time with patient was 15 min.  Greater than 50% was spent in counseling and coordination of care with the patient.      Keric Zehren L. Harraway-Smith, M.D., [facility]

## 2017-03-31 ENCOUNTER — Encounter: Payer: Self-pay | Admitting: Obstetrics & Gynecology

## 2017-04-04 ENCOUNTER — Encounter (HOSPITAL_COMMUNITY): Payer: Self-pay

## 2017-04-05 ENCOUNTER — Telehealth: Payer: Self-pay

## 2017-04-05 NOTE — Telephone Encounter (Signed)
Patient called stating that [facility] is calling her about her procedure she is having on 04-12-17. Patient states she is not having procedure done.   She states she is coming to office on 04-20-17 for an endometrial biopsy and ultrasound.   Will message Dr. Ihor Dow about the plan. Patient is NOT planning on surgery right now. Kathrene Alu RNBSN

## 2017-04-06 ENCOUNTER — Encounter (HOSPITAL_COMMUNITY): Payer: Self-pay

## 2017-04-07 ENCOUNTER — Encounter (HOSPITAL_COMMUNITY): Payer: Self-pay

## 2017-04-19 ENCOUNTER — Encounter: Payer: Self-pay | Admitting: Obstetrics & Gynecology

## 2017-04-20 ENCOUNTER — Other Ambulatory Visit: Payer: Self-pay | Admitting: Obstetrics & Gynecology

## 2017-04-20 ENCOUNTER — Encounter: Payer: Self-pay | Admitting: Obstetrics & Gynecology

## 2017-04-20 ENCOUNTER — Ambulatory Visit (INDEPENDENT_AMBULATORY_CARE_PROVIDER_SITE_OTHER): Payer: BLUE CROSS/BLUE SHIELD | Admitting: Obstetrics & Gynecology

## 2017-04-20 ENCOUNTER — Other Ambulatory Visit (HOSPITAL_COMMUNITY)
Admission: RE | Admit: 2017-04-20 | Discharge: 2017-04-20 | Disposition: A | Discharge status: Home / Self Care | Payer: BLUE CROSS/BLUE SHIELD | Source: Ambulatory Visit | Attending: Obstetrics & Gynecology | Admitting: Obstetrics & Gynecology

## 2017-04-20 ENCOUNTER — Ambulatory Visit (HOSPITAL_BASED_OUTPATIENT_CLINIC_OR_DEPARTMENT_OTHER)
Admission: RE | Admit: 2017-04-20 | Discharge: 2017-04-20 | Disposition: A | Discharge status: Home / Self Care | Payer: BLUE CROSS/BLUE SHIELD | Source: Ambulatory Visit | Attending: Family Medicine | Admitting: Family Medicine

## 2017-04-20 VITALS — BP 141/83 | HR 90 | Ht 67.0 in | Wt 261.0 lb

## 2017-04-20 DIAGNOSIS — D259 Leiomyoma of uterus, unspecified: Secondary | ICD-10-CM | POA: Insufficient documentation

## 2017-04-20 DIAGNOSIS — N852 Hypertrophy of uterus: Secondary | ICD-10-CM | POA: Insufficient documentation

## 2017-04-20 DIAGNOSIS — R1011 Right upper quadrant pain: Secondary | ICD-10-CM | POA: Insufficient documentation

## 2017-04-20 DIAGNOSIS — N939 Abnormal uterine and vaginal bleeding, unspecified: Secondary | ICD-10-CM | POA: Insufficient documentation

## 2017-04-20 NOTE — Progress Notes (Signed)
Pt is scheduled for TAH with BSO in Feb. Here for an Endo bx.    CLINICAL DATA:  Abnormal uterine bleeding  EXAM: TRANSABDOMINAL ULTRASOUND OF PELVIS  TECHNIQUE: Transabdominal ultrasound examinations of the pelvis were performed. Transabdominal technique was performed for global imaging of the pelvis including uterus, ovaries, adnexal regions, and pelvic cul-de-sac.  : 10/19/2012  FINDINGS: Uterus  Measurements: 21.4 x 11.4 x 13.6 cm. 10.8 cm left posterior fundal fibroid. 10.2 cm central uterine fibroid.  Endometrium  Thickness: Limited views due to fibroids. 18 mm in thickness where visualized. No focal abnormality visualized.  Right ovary  Measurements: Unable to visualize. No adnexal mass seen.  Left ovary  Measurements: 3.2 x 1.8 x 3.4 cm. Normal appearance/no adnexal mass.  Other findings  No abnormal free fluid.  The patient refused transvaginal imaging.  IMPRESSION: Enlarged fibroid uterus, increasing in size since prior study.  The indications for endometrial biopsy were reviewed.   Risks of the biopsy including cramping, bleeding, infection, uterine perforation, inadequate specimen and need for additional procedures  were discussed. The patient states she understands and agrees to undergo procedure today. Consent was signed. Time out was performed. Urine HCG was negative. A sterile speculum was placed in the patient's vagina and the cervix was prepped with Betadine. A single-toothed tenaculum was placed on the anterior lip of the cervix to stabilize it. The 3 mm pipelle was introduced into the endometrial cavity without difficulty to a depth of >15cm, and a moderate amount of blood was obtained (unsure how much tissue was in specimen after 2 passes) and sent to pathology. The instruments were removed from the patient's vagina. Minimal bleeding from the cervix was noted. The patient tolerated the procedure well. Routine post-procedure instructions  were given to the patient. The patient will follow up to review the results and for further management.    Salli Bodin L. Harraway-Smith, M.D., [facility]

## 2017-04-20 NOTE — Patient Instructions (Signed)
Abdominal Hysterectomy, Care After This sheet gives you information about how to care for yourself after your procedure. Your doctor may also give you more specific instructions. If you have problems or questions, contact your doctor. Follow these instructions at home: Bathing  Do not take baths, swim, or use a hot tub until your doctor says it is okay. Ask your doctor if you can take showers. You may only be allowed to take sponge baths for bathing.  Keep the bandage (dressing) dry until your doctor says it can be taken off. Surgical cut ( incision) care  Follow instructions from your doctor about how to take care of your cut from surgery. Make sure you: ? Wash your hands with soap and water before you change your bandage (dressing). If you cannot use soap and water, use hand sanitizer. ? Change your bandage as told by your doctor. ? Leave stitches (sutures), skin glue, or skin tape (adhesive) strips in place. They may need to stay in place for 2 weeks or longer. If tape strips get loose and curl up, you may trim the loose edges. Do not remove tape strips completely unless your doctor says it is okay.  Check your surgical cut area every day for signs of infection. Check for: ? Redness, swelling, or pain. ? Fluid or blood. ? Warmth. ? Pus or a bad smell. Activity  Do gentle, daily exercise as told by your doctor. You may be told to take short walks every day and go farther each time.  Do not lift anything that is heavier than 10 lb (4.5 kg), or the limit that your doctor tells you, until he or she says that it is safe.  Do not drive or use heavy machinery while taking prescription pain medicine.  Do not drive for 24 hours if you were given a medicine to help you relax (sedative).  Follow your doctor's advice about exercise, driving, and general activities. Ask your doctor what activities are safe for you. Lifestyle  Do not douche, use tampons, or have sex for at least 6 weeks or as  told by your doctor.  Do not drink alcohol until your doctor says it is okay.  Drink enough fluid to keep your pee (urine) clear or pale yellow.  Try to have someone at home with you for the first 1-2 weeks to help.  Do not use any products that contain nicotine or tobacco, such as cigarettes and e-cigarettes. These can slow down healing. If you need help quitting, ask your doctor. General instructions  Take over-the-counter and prescription medicines only as told by your doctor.  Do not take aspirin or ibuprofen. These medicines can cause bleeding.  To prevent or treat constipation while you are taking prescription pain medicine, your doctor may suggest that you: ? Drink enough fluid to keep your urine clear or pale yellow. ? Take over-the-counter or prescription medicines. ? Eat foods that are high in fiber, such as:  Fresh fruits and vegetables.  Whole grains.  Beans. ? Limit foods that are high in fat and processed sugars, such as fried and sweet foods.  Keep all follow-up visits as told by your doctor. This is important. Contact a doctor if:  You have chills or fever.  You have redness, swelling, or pain around your cut.  You have fluid or blood coming from your cut.  Your cut feels warm to the touch.  You have pus or a bad smell coming from your cut.  Your cut breaks  open.  You feel dizzy or light-headed.  You have pain or bleeding when you pee.  You keep having watery poop (diarrhea).  You keep feeling sick to your stomach (nauseous) or keep throwing up (vomiting).  You have unusual fluid (discharge) coming from your vagina.  You have a rash.  You have a reaction to your medicine.  Your pain medicine does not help. Get help right away if:  You have a fever and your symptoms get worse all of a sudden.  You have very bad belly (abdominal) pain.  You are short of breath.  You pass out (faint).  You have pain, swelling, or redness of your  leg.  You bleed a lot from your vagina and notice clumps of blood (clots). Summary  Do not take baths, swim, or use a hot tub until your doctor says it is okay. Ask your doctor if you can take showers. You may only be allowed to take sponge baths for bathing.  Follow your doctor's advice about exercise, driving, and general activities. Ask your doctor what activities are safe for you.  Do not lift anything that is heavier than 10 lb (4.5 kg), or the limit that your doctor tells you, until he or she says that it is safe.  Try to have someone at home with you for the first 1-2 weeks to help. This information is not intended to replace advice given to you by your health care provider. Make sure you discuss any questions you have with your health care provider. Document Released: 01/27/2008 Document Revised: 04/07/2016 Document Reviewed: 04/07/2016 Elsevier Interactive Patient Education  2017 [place]. Hysterectomy Information A hysterectomy is a surgery in which your uterus is removed. This surgery may be done to treat various medical problems. After the surgery, you will no longer have menstrual periods. The surgery will also make you unable to become pregnant (sterile). The fallopian tubes and ovaries can be removed (bilateral salpingo-oophorectomy) during this surgery as well. Reasons for a hysterectomy  Persistent, abnormal bleeding.  Lasting (chronic) pelvic pain or infection.  The lining of the uterus (endometrium) starts growing outside the uterus (endometriosis).  The endometrium starts growing in the muscle of the uterus (adenomyosis).  The uterus falls down into the vagina (pelvic organ prolapse).  Noncancerous growths in the uterus (uterine fibroids) that cause symptoms.  Precancerous cells.  Cervical cancer or uterine cancer. Types of hysterectomies  Supracervical hysterectomy-In this type, the top part of the uterus is removed, but not the cervix.  Total  hysterectomy-The uterus and cervix are removed.  Radical hysterectomy-The uterus, the cervix, and the fibrous tissue that holds the uterus in place in the pelvis (parametrium) are removed. Ways a hysterectomy can be performed  Abdominal hysterectomy-A large surgical cut (incision) is made in the abdomen. The uterus is removed through this incision.  Vaginal hysterectomy-An incision is made in the vagina. The uterus is removed through this incision. There are no abdominal incisions.  Conventional laparoscopic hysterectomy-Three or four small incisions are made in the abdomen. A thin, lighted tube with a camera (laparoscope) is inserted into one of the incisions. Other tools are put through the other incisions. The uterus is cut into small pieces. The small pieces are removed through the incisions, or they are removed through the vagina.  Laparoscopically assisted vaginal hysterectomy (LAVH)-Three or four small incisions are made in the abdomen. Part of the surgery is performed laparoscopically and part vaginally. The uterus is removed through the vagina.  Robot-assisted laparoscopic  hysterectomy-A laparoscope and other tools are inserted into 3 or 4 small incisions in the abdomen. A computer-controlled device is used to give the surgeon a 3D image and to help control the surgical instruments. This allows for more precise movements of surgical instruments. The uterus is cut into small pieces and removed through the incisions or removed through the vagina. What are the risks? Possible complications associated with this procedure include:  Bleeding and risk of blood transfusion. Tell your health care provider if you do not want to receive any blood products.  Blood clots in the legs or lung.  Infection.  Injury to surrounding organs.  Problems or side effects related to anesthesia.  Conversion to an abdominal hysterectomy from one of the other techniques.  What to expect after a  hysterectomy  You will be given pain medicine.  You will need to have someone with you for the first 3-5 days after you go home.  You will need to follow up with your surgeon in 2-4 weeks after surgery to evaluate your progress.  You may have early menopause symptoms such as hot flashes, night sweats, and insomnia.  If you had a hysterectomy for a problem that was not cancer or not a condition that could lead to cancer, then you no longer need Pap tests. However, even if you no longer need a Pap test, a regular exam is a good idea to make sure no other problems are starting. This information is not intended to replace advice given to you by your health care provider. Make sure you discuss any questions you have with your health care provider. Document Released: 10/13/2000 Document Revised: 09/25/2015 Document Reviewed: 12/25/2012 Elsevier Interactive Patient Education  2017 [place]. Endometrial Biopsy, Care After This sheet gives you information about how to care for yourself after your procedure. Your health care provider may also give you more specific instructions. If you have problems or questions, contact your health care provider. What can I expect after the procedure? After the procedure, it is common to have:  Mild cramping.  A small amount of vaginal bleeding for a few days. This is normal.  Follow these instructions at home:  Take over-the-counter and prescription medicines only as told by your health care provider.  Do not douche, use tampons, or have sexual intercourse until your health care provider approves.  Return to your normal activities as told by your health care provider. Ask your health care provider what activities are safe for you.  Follow instructions from your health care provider about any activity restrictions, such as restrictions on strenuous exercise or heavy lifting. Contact a health care provider if:  You have heavy bleeding, or bleed for  longer than 2 days after the procedure.  You have bad smelling discharge from your vagina.  You have a fever or chills.  You have a burning sensation when urinating or you have difficulty urinating.  You have severe pain in your lower abdomen. Get help right away if:  You have severe cramps in your stomach or back.  You pass large blood clots.  Your bleeding increases.  You become weak or light-headed, or you pass out. Summary  After the procedure, it is common to have mild cramping and a small amount of vaginal bleeding for a few days.  Do not douche, use tampons, or have sexual intercourse until your health care provider approves.  Return to your normal activities as told by your health care provider. Ask your health care  provider what activities are safe for you. This information is not intended to replace advice given to you by your health care provider. Make sure you discuss any questions you have with your health care provider. Document Released: 02/07/2013 Document Revised: 05/05/2016 Document Reviewed: 05/05/2016 Elsevier Interactive Patient Education  2017 [employer].

## 2017-04-22 ENCOUNTER — Other Ambulatory Visit (HOSPITAL_BASED_OUTPATIENT_CLINIC_OR_DEPARTMENT_OTHER): Payer: BLUE CROSS/BLUE SHIELD

## 2017-04-22 ENCOUNTER — Ambulatory Visit: Payer: Self-pay | Admitting: Hematology & Oncology

## 2017-04-22 ENCOUNTER — Ambulatory Visit (HOSPITAL_BASED_OUTPATIENT_CLINIC_OR_DEPARTMENT_OTHER): Payer: BLUE CROSS/BLUE SHIELD | Admitting: Hematology & Oncology

## 2017-04-22 ENCOUNTER — Other Ambulatory Visit: Payer: Self-pay

## 2017-04-22 ENCOUNTER — Encounter: Payer: Self-pay | Admitting: Family Medicine

## 2017-04-22 VITALS — BP 166/88 | HR 83 | Temp 100.4°F | Resp 18 | Wt 266.0 lb

## 2017-04-22 DIAGNOSIS — R233 Spontaneous ecchymoses: Secondary | ICD-10-CM | POA: Diagnosis not present

## 2017-04-22 DIAGNOSIS — D699 Hemorrhagic condition, unspecified: Secondary | ICD-10-CM

## 2017-04-22 DIAGNOSIS — C8580 Other specified types of non-Hodgkin lymphoma, unspecified site: Secondary | ICD-10-CM | POA: Diagnosis not present

## 2017-04-22 LAB — CBC WITH DIFFERENTIAL (CANCER CENTER ONLY)
BASO#: 0 10*3/uL (ref 0.0–0.2)
BASO%: 0.3 % (ref 0.0–2.0)
EOS%: 3.1 % (ref 0.0–7.0)
Eosinophils Absolute: 0.2 10*3/uL (ref 0.0–0.5)
HCT: 41.3 % (ref 34.8–46.6)
HGB: 13.4 g/dL (ref 11.6–15.9)
LYMPH#: 1.8 10*3/uL (ref 0.9–3.3)
LYMPH%: 27.4 % (ref 14.0–48.0)
MCH: 29 pg (ref 26.0–34.0)
MCHC: 32.4 g/dL (ref 32.0–36.0)
MCV: 89 fL (ref 81–101)
MONO#: 0.6 10*3/uL (ref 0.1–0.9)
MONO%: 8.7 % (ref 0.0–13.0)
NEUT#: 4 10*3/uL (ref 1.5–6.5)
NEUT%: 60.5 % (ref 39.6–80.0)
Platelets: 252 10*3/uL (ref 145–400)
RBC: 4.62 10*6/uL (ref 3.70–5.32)
RDW: 16.1 % — ABNORMAL HIGH (ref 11.1–15.7)
WBC: 6.7 10*3/uL (ref 3.9–10.0)

## 2017-04-22 LAB — CHCC SATELLITE - SMEAR

## 2017-04-22 NOTE — Progress Notes (Signed)
Referral MD  Reason for Referral: Easy bruising  Chief Complaint  Patient presents with  . New Patient (Initial Visit)  : I have been bruising for the past year.  HPI: Christina [name] is a very nice 51 year old African-American female.  She has had uterine fibroids.  She is to have a hysterectomy in March.  She still has her monthly cycles.  She says they are heavy.  They have been heavy for a while.  For the past year, she says that she has had some bruising.  She does not have to hit anything.  The bruising is on her arms, legs, trunk.  She has had no obvious spontaneous bleeding.  There is been no hematuria.  She has had no melena or bright red blood per rectum.  She has had no hemoptysis.  There is no hematemesis.  She has had no change in her medications.  She does take ibuprofen on occasion.  She does not take it every day.  She does not take aspirin or aspirin products.  She sees Dr. Lorelei Pont for her primary care.  Dr. Edilia Bo kindly referred her to the [facility] for an evaluation.  She does not have history of sickle cell.  There is no history of sickle cell in the family.  There is no bleeding in the family..  She has had one child.  This was delivered via C-section.  There is no problems with bleeding.  Christina [name] recently had a uterine biopsy.  There was no bleeding from this biopsy.  She says of the bruises last for about a week.  She currently does not have any bruises on her.  She works for [employer] and [employer].  She has had no weight loss or weight gain.  She has had no rashes.  She does not smoke.  She does not drink.  There is been no fever.  She did have a TSH done back in February.  This was normal.  She is not diabetic.  She does not take any type of supplements.  She is not a vegetarian.  She eats what she likes.  Currently, her performance status is ECOG 0.     Past Medical History:  Diagnosis Date  . Chest pain   .  Essential hypertension 07/08/2016  . Uterine fibroid   . Vitamin D deficiency   :  Past Surgical History:  Procedure Laterality Date  . lump removed from breast at age 8    . ovarian cyst removed at 51yr old    :   Current Outpatient Medications:  .  carvedilol (COREG) 6.25 MG tablet, Take 1 tablet (6.25 mg total) by mouth 2 (two) times daily., Disp: 180 tablet, Rfl: 3 .  furosemide (LASIX) 20 MG tablet, Take 2 tablets (40 mg total) daily by mouth., Disp: 180 tablet, Rfl: 3 .  losartan (COZAAR) 100 MG tablet, Take 1 tablet (100 mg total) by mouth daily., Disp: 90 tablet, Rfl: 3 .  megestrol (MEGACE) 40 MG tablet, Take 1 tablet (40 mg total) by mouth 2 (two) times daily. Can increase to two tablets twice a day in the event of heavy bleeding (Patient taking differently: Take 40 mg by mouth See admin instructions. Take 40 mg by mouth daily. May take additional 40 mg in the event of heavy bleeding), Disp: 60 tablet, Rfl: 5 .  potassium chloride SA (K-DUR,KLOR-CON) 20 MEQ tablet, Take 1 tablet (20 mEq total) daily by mouth., Disp: 90 tablet, Rfl:  3 .  sucralfate (CARAFATE) 1 g tablet, Take 1 tablet (1 g total) by mouth 4 (four) times daily -  with meals and at bedtime., Disp: 40 tablet, Rfl: 0:  :  Allergies  Allergen Reactions  . Lisinopril Cough  :  Family History  Problem Relation Age of Onset  . Hypertension Mother   . Dementia Father   . Colon polyps Neg Hx   . Crohn's disease Neg Hx   . Rectal cancer Neg Hx   . Stomach cancer Neg Hx   :  Social History   Socioeconomic History  . Marital status: Single    Spouse name: Not on file  . Number of children: Not on file  . Years of education: Not on file  . Highest education level: Not on file  Social Needs  . Financial resource strain: Not on file  . Food insecurity - worry: Not on file  . Food insecurity - inability: Not on file  . Transportation needs - medical: Not on file  . Transportation needs - non-medical: Not on  file  Occupational History  . Not on file  Tobacco Use  . Smoking status: Never Smoker  . Smokeless tobacco: Never Used  Substance and Sexual Activity  . Alcohol use: No  . Drug use: No  . Sexual activity: Yes  Other Topics Concern  . Not on file  Social History Narrative  . Not on file  :  Pertinent items are noted in HPI.  Exam: Obese African-American female in no obvious distress.  Vital signs show temperature of 100.4.  Pulse 83.  Blood pressure 183/103.  Weight is 266 pounds.  Head neck exam shows no ocular or oral lesions.  There are no palpable cervical or supraclavicular lymph nodes.  Lungs are clear bilaterally.  Cardiac exam regular rate and rhythm with no murmurs, rubs or bruits.  Axillary exam shows no bilateral axillary adenopathy.  Abdomen is obese but soft.  She has good bowel sounds.  There is no fluid wave.  There is no palpable liver or spleen tip.  Back exam shows no tenderness over the spine, ribs or hips.  Extremities shows no clubbing, cyanosis or edema.  Neurological exam shows no focal neurological deficits.  Skin exam shows no ecchymoses.  There is no petechia.  She has no rashes. @IPVITALS @   Recent Labs    04/22/17 1421  WBC 6.7  HGB 13.4  HCT 41.3  PLT 252   No results for input(s): NA, K, CL, CO2, GLUCOSE, BUN, CREATININE, CALCIUM in the last 72 hours.  Blood smear review: Normochromic and normocytic population of red blood cells.  She has no nucleated red blood cells.  She has no teardrop cells.  She has no rouleaux formation.  I see no inclusion bodies.  White cells appear normal in morphology and maturation.  She has no hypersegmented polys.  She has no atypical myeloid or lymphoid cells.  Platelets are adequate in number and size.  Platelets are well granulated.  Pathology: None    Assessment and Plan: Christina [name] is a very charming 51 year old African-American female.  She has bruising.  I cannot imagine that she has any type of coagulopathy.   There is no family history of malignancy or coagulopathy.  There is no bleeding in the family.  She has had a past C-section without bleeding.  She had a recent biopsy of the uterus without bleeding.  I have to wonder if this may not be something  that could be hormonal.  Sometimes, we see women who go into the menopause have some bruising.  Given that she is African-American, I would think that von Willebrand's is unlikely.  I am checking her pro time and PTT.  Again, I would not imagine that there is any type of factor deficiency.  I am concerned that she is having surgery for her hysterectomy in March.  I want to make sure that she is going to be safe for surgery.  I want to make sure there is no bleeding with surgery.  I spent about 45 minutes with her.  I reviewed her labs.  I went over my recommendations.  Told her to try vitamin C.  I told her to try 500 mg p.o. twice daily of vitamin C.  I told her to try to minimize her ibuprofen use.  We sent off her blood studies.  I will call her when I have the results back.  I would like to see her back in February.  I want to make sure that everything will be okay prior to her surgery for hysterectomy.  I answered all of her questions.  She is very intelligent and asked quite a few questions that were quite poignant.

## 2017-04-23 LAB — VON WILLEBRAND PANEL
Factor VIII Activity: 161 % (ref 57–163)
vWF Activity: 197 % (ref 50–200)
von Willebrand Factor (vWF) Ag: 252 % — ABNORMAL HIGH (ref 50–200)

## 2017-04-23 LAB — PROTHROMBIN TIME (PT)
INR: 1.1 (ref 0.8–1.2)
Prothrombin Time: 10.9 s (ref 9.1–12.0)

## 2017-04-23 LAB — APTT: aPTT: 28 s (ref 24–33)

## 2017-04-25 LAB — COLD AGGLUTININ TITER: Cold Agglutinin Titer, Quant: NEGATIVE

## 2017-04-27 ENCOUNTER — Encounter: Payer: Self-pay | Admitting: Family Medicine

## 2017-04-28 ENCOUNTER — Telehealth: Payer: Self-pay

## 2017-04-28 LAB — CRYOGLOBULIN

## 2017-04-28 NOTE — Telephone Encounter (Signed)
Left message for patient to return call to office. Aj Crunkleton  RN 

## 2017-04-28 NOTE — Telephone Encounter (Signed)
-----   Message from Lavonia Drafts, MD sent at 04/28/2017  2:49 PM EST ----- Please call pt. Her endo biopsy was negative for cancer. She should proceed with surgery as scheduled  Thx, clh-S

## 2017-04-28 NOTE — Telephone Encounter (Signed)
Patient returned call to office. And made aware that endometrial biopsy was negative for cancer and can proceed with surgery. Patient states understanding. Kathrene Alu RNBSN

## 2017-04-30 NOTE — Progress Notes (Addendum)
[facility] [address] [place], [place] [address] [phone]  Date:  05/02/2017   Name:  Christina [name]   DOB:  1966/01/24   MRN:  [id number]  PCP:  Darreld Mclean, MD    Chief Complaint: Follow-up (Here to f/u on BP. And would like A1C checked today. )   History of Present Illness:  [name] is a 51 y.o. very pleasant female patient who presents with the following:  Here today to follow-up on her BP - she had emailed me with concern that her pressure has been elevated  Last seen here about a month ago- per my last note:  I saw her in October with concern of chest pain, and she followed -up with cardiology (Dr. Acie Fredrickson) She underwent coronary calcium scoring Coronary CT angiogram shows no evidence of artery disease. There was a suggestion that she might have pulmonary hypertension but this was not seen with echo. The echocardiogram shows normal left ventricular systolic function with mild diastolic dysfunction. The pulmonary arteries were normal size. Furthermore, the right ventricle has normal size and function. Estimated PA pressure is 23 which is normal. Cardiology does not feel that her CP is due to cardiac disease  She notes that she continues to have episodes of CP- however it sounds like she is having RUQ pain which then radiates into her chest   BP Readings from Last 3 Encounters:  05/02/17 (!) 172/107  04/22/17 (!) 166/88  04/20/17 (!) 141/83   We did a RUQ [place] for her which was normal   US Abdomen Limited Ruq  Result Date: 04/20/2017 CLINICAL DATA:  Right upper quadrant pain EXAM: ULTRASOUND ABDOMEN LIMITED RIGHT UPPER QUADRANT COMPARISON:  None. FINDINGS: Gallbladder: No gallstones or wall thickening visualized. No sonographic Murphy sign noted by sonographer. Common bile duct: Diameter: 3.1 mm Liver: No focal lesion identified. Within normal limits in parenchymal echogenicity. Portal vein is  patent on color Doppler imaging with normal direction of blood flow towards the liver. IMPRESSION: Negative right upper quadrant abdominal ultrasound Electronically Signed   By: Donavan Foil M.D.   On: 04/20/2017 23:42   She is not on any other BP meds Her mother and several other relatives have had HTN  She notes that the CP she has noted over the last several years continue to come and go.   She is fasting this am  Lab Results  Component Value Date   TSH 0.42 06/23/2016   She plans to have a hysterectomy in March, so we will want to get her BP under control prior to this  There is also a questions of Von Willebrands' disease- she has seen Dr. Marin Olp but they have not yet connected about her results. I am not sure how to interpret them myself   She does not note any vision change.  No change in her typical headache pattern  Patient Active Problem List   Diagnosis Date Noted  . Obesity 07/08/2016  . Essential hypertension 07/08/2016  . Pre-diabetes 06/24/2016  . Uterine fibroid   . Vitamin D deficiency   . Atypical chest pain     Past Medical History:  Diagnosis Date  . Chest pain   . Essential hypertension 07/08/2016  . Uterine fibroid   . Vitamin D deficiency     Past Surgical History:  Procedure Laterality Date  . lump removed from breast at age 26    . ovarian cyst  removed at 51yr old      Social History   Tobacco Use  . Smoking status: Never Smoker  . Smokeless tobacco: Never Used  Substance Use Topics  . Alcohol use: No  . Drug use: No    Family History  Problem Relation Age of Onset  . Hypertension Mother   . Dementia Father   . Colon polyps Neg Hx   . Crohn's disease Neg Hx   . Rectal cancer Neg Hx   . Stomach cancer Neg Hx     Allergies  Allergen Reactions  . Lisinopril Cough    Medication list has been reviewed and updated.  Current Outpatient Medications on File Prior to Visit  Medication Sig Dispense Refill  . carvedilol (COREG) 6.25  MG tablet Take 1 tablet (6.25 mg total) by mouth 2 (two) times daily. (Patient taking differently: Take 6.25 mg by mouth. Take 2 tablets by mouth daily.) 180 tablet 3  . losartan (COZAAR) 100 MG tablet Take 1 tablet (100 mg total) by mouth daily. 90 tablet 3  . megestrol (MEGACE) 40 MG tablet Take 1 tablet (40 mg total) by mouth 2 (two) times daily. Can increase to two tablets twice a day in the event of heavy bleeding (Patient taking differently: Take 40 mg by mouth See admin instructions. Take 40 mg by mouth daily. May take additional 40 mg in the event of heavy bleeding) 60 tablet 5   No current facility-administered medications on file prior to visit.     Review of Systems:  As per HPI- otherwise negative.   Physical Examination: Vitals:   05/02/17 0933 05/02/17 0943  BP: (!) 172/104 (!) 172/107  Pulse: 77   Temp: 98.4 F (36.9 C)   SpO2: 100%    Vitals:   05/02/17 0933  Weight: 263 lb 3.2 oz (119.4 kg)  Height: 5\' 7"  (1.702 m)   Body mass index is 41.22 kg/m. Ideal Body Weight: Weight in (lb) to have BMI = 25: 159.3  GEN: WDWN, NAD, Non-toxic, A & O x 3, obese, looks well otherwise HEENT: Atraumatic, Normocephalic. Neck supple. No masses, No LAD.  Bilateral TM wnl, oropharynx normal.  PEERL,EOMI.   Ears and Nose: No external deformity. CV: RRR, No M/G/R. No JVD. No thrill. No extra heart sounds. PULM: CTA B, no wheezes, crackles, rhonchi. No retractions. No resp. distress. No accessory muscle use. ABD: S, NT, ND, +BS. No rebound. No HSM. EXTR: No c/c/e NEURO Normal gait.  PSYCH: Normally interactive. Conversant. Not depressed or anxious appearing.  Calm demeanor.    Assessment and Plan: Essential hypertension - Plan: losartan-hydrochlorothiazide (HYZAAR) 100-12.5 MG tablet, TSH, Lipid panel, HgB A1c, CANCELED: TSH  Easy bruising  Menorrhagia with irregular cycle  Pre-diabetes - Plan: HgB A1c, CANCELED: Hemoglobin A1c  Screening for hyperlipidemia - Plan: Lipid  panel  Morbid obesity ([place]) - Plan: CANCELED: Lipid panel  Family history of hypertension  Screening for colon cancer  Here today with persistently high blood pressure Will add hctz 12.5 to her losartan She is also taking 2 of her coreg in the am instead of doing BID dosing. explained that this will not work as well, she will change to BID, will take an extra coreg this evening to bring down her BP until she can start on the HCTZ tomorrow am   Will plan further follow- up pending labs. She is able to check her BP at home and will contact me in about one week with a report  Discussed  colon cancer screening and she would like to have cologuard- ordered this for her today Will touch base with Dr. Marin Olp about her recent von willebrand's results as I am not sure how to interpret this    Signed Lamar Blinks, MD  Received her labs 05/03/17  Results for orders placed or performed in visit on 05/02/17  TSH  Result Value Ref Range   TSH 0.61 mIU/L  Lipid panel  Result Value Ref Range   Cholesterol 174 <200 mg/dL   HDL 56 >50 mg/dL   Triglycerides 95 <150 mg/dL   LDL Cholesterol (Calc) 99 mg/dL (calc)   Total CHOL/HDL Ratio 3.1 <5.0 (calc)   Non-HDL Cholesterol (Calc) 118 <130 mg/dL (calc)  HgB A1c  Result Value Ref Range   Hgb A1c MFr Bld 5.7 (H) <5.7 % of total Hgb   Mean Plasma Glucose 117 (calc)   eAG (mmol/L) 6.5 (calc)   Look very good - message to pt  Your labs look good-  A1c is at the bottom of the pre-diabetes range. Good news Cholesterol is quite good Thyroid is normal  Please keep me posted about your blood pressure readings.  Please see me in about 3 weeks to follow-up on your BP and check your labs (will want to follow your electrolytes as you are now on a fluid pill, the HCTZ).  I want to make sure you are all set for your planned operation in the spring JC

## 2017-05-02 ENCOUNTER — Ambulatory Visit (INDEPENDENT_AMBULATORY_CARE_PROVIDER_SITE_OTHER): Payer: BLUE CROSS/BLUE SHIELD | Admitting: Family Medicine

## 2017-05-02 ENCOUNTER — Encounter: Payer: Self-pay | Admitting: Hematology & Oncology

## 2017-05-02 ENCOUNTER — Encounter: Payer: Self-pay | Admitting: Family Medicine

## 2017-05-02 ENCOUNTER — Telehealth: Payer: Self-pay | Admitting: Hematology & Oncology

## 2017-05-02 VITALS — BP 170/100 | HR 77 | Temp 98.4°F | Ht 67.0 in | Wt 263.2 lb

## 2017-05-02 DIAGNOSIS — R7303 Prediabetes: Secondary | ICD-10-CM

## 2017-05-02 DIAGNOSIS — Z13220 Encounter for screening for lipoid disorders: Secondary | ICD-10-CM

## 2017-05-02 DIAGNOSIS — N921 Excessive and frequent menstruation with irregular cycle: Secondary | ICD-10-CM | POA: Diagnosis not present

## 2017-05-02 DIAGNOSIS — I10 Essential (primary) hypertension: Secondary | ICD-10-CM

## 2017-05-02 DIAGNOSIS — Z8249 Family history of ischemic heart disease and other diseases of the circulatory system: Secondary | ICD-10-CM | POA: Diagnosis not present

## 2017-05-02 DIAGNOSIS — Z1211 Encounter for screening for malignant neoplasm of colon: Secondary | ICD-10-CM

## 2017-05-02 DIAGNOSIS — R238 Other skin changes: Secondary | ICD-10-CM | POA: Diagnosis not present

## 2017-05-02 DIAGNOSIS — R233 Spontaneous ecchymoses: Secondary | ICD-10-CM

## 2017-05-02 MED ORDER — LOSARTAN POTASSIUM-HCTZ 100-12.5 MG PO TABS: 1.0000 | ORAL_TABLET | Freq: Every day | ORAL | 3 refills | Status: DC

## 2017-05-02 NOTE — Telephone Encounter (Signed)
I called Ms. Christina [name] at around 6:15 PM on December 31.  I got moved to her answer machine.  I told her that all of her lab tests for the bruising were normal.  I see nothing that we sent off on her that is abnormal that would account for her bruising.  The one test we have not yet done is a platelet aggregation study.  This is a test that gets sent off to I think [facility].  I told her that if she would like to have this test done that she should let us know so we can have her come in earlier in the day so this can be sent off.  I said that if this platelet aggregation study is normal, I just cannot think of another test to do that would tell us where she was why she is bruising.  At that point, I would consider referring her to 1 of the academic medical centers to see if they might be able to know what might be going on.  I told her to give [place] a call to let us know if she would like to have that platelet aggregation test done.  I apologized for not calling or sooner.  I know that with the holidays and with my abbreviated vacation, this is no excuse.  Lattie Haw, MD

## 2017-05-02 NOTE — Patient Instructions (Addendum)
It was good to see you today!   We will change your losartan 100 to losartan/ HCTZ 100/12.5. Continue the coreg as well I will check your labs as below, and will touch base with Dr. Marin Olp about our question of von willebrand disorder.    Please contact me in about 7 days with some BP readings from work - sooner if you are not doing ok  Please start taking your coreg twice a day- it will work better this way as it only lasts for 12 hours Take an extra coreg this evening to help bring your pressure down a bit - then start on the new losartan with HCTZ tomorrow We will order cologuard to screen for colon cancer

## 2017-05-03 ENCOUNTER — Encounter: Payer: Self-pay | Admitting: Family Medicine

## 2017-05-03 LAB — HEMOGLOBIN A1C
Hgb A1c MFr Bld: 5.7 % of total Hgb — ABNORMAL HIGH (ref <5.7)
Mean Plasma Glucose: 117 (calc)
eAG (mmol/L): 6.5 (calc)

## 2017-05-03 LAB — TSH: TSH: 0.61 mIU/L

## 2017-05-03 LAB — LIPID PANEL
Cholesterol: 174 mg/dL (ref <200)
HDL: 56 mg/dL (ref >50)
LDL Cholesterol (Calc): 99 mg/dL (calc)
Non-HDL Cholesterol (Calc): 118 mg/dL (calc) (ref <130)
Total CHOL/HDL Ratio: 3.1 (calc) (ref <5.0)
Triglycerides: 95 mg/dL (ref <150)

## 2017-05-04 ENCOUNTER — Telehealth: Payer: Self-pay | Admitting: *Deleted

## 2017-05-04 NOTE — Telephone Encounter (Signed)
Pt called to office with questions regarding BP and upcoming surgery. Attempt to contact pt. LM on VM call back.

## 2017-05-05 ENCOUNTER — Telehealth: Payer: Self-pay | Admitting: *Deleted

## 2017-05-05 NOTE — Telephone Encounter (Signed)
Pt returned called to office this morning. Discussed changes that have been made to upcoming surgery. Pt was made aware that provider would like her BP to be under 140/90 in order to have surgery as planned. Pt advised to continue on medication that was given by PCP and to have f/u in 3 weeks as recommended. Pt made aware that provider is following up on surgery information in order to provide further recommendations.  Pt made aware that she may receive a call with recommendations from provider regarding need for f/u appt once BP is under control.  Pt states understanding.

## 2017-05-08 ENCOUNTER — Encounter: Payer: Self-pay | Admitting: Family Medicine

## 2017-05-08 DIAGNOSIS — Z1212 Encounter for screening for malignant neoplasm of rectum: Secondary | ICD-10-CM | POA: Diagnosis not present

## 2017-05-08 DIAGNOSIS — Z1211 Encounter for screening for malignant neoplasm of colon: Secondary | ICD-10-CM | POA: Diagnosis not present

## 2017-05-09 ENCOUNTER — Encounter (HOSPITAL_COMMUNITY): Payer: Self-pay

## 2017-05-11 LAB — COLOGUARD

## 2017-05-23 ENCOUNTER — Encounter: Payer: Self-pay | Admitting: Family Medicine

## 2017-05-23 ENCOUNTER — Telehealth: Payer: Self-pay | Admitting: *Deleted

## 2017-05-23 NOTE — Telephone Encounter (Signed)
Received Cologuard results from [facility]; forwarded to provider/SLS 01/21

## 2017-05-30 ENCOUNTER — Encounter: Payer: Self-pay | Admitting: Family Medicine

## 2017-06-07 ENCOUNTER — Inpatient Hospital Stay (HOSPITAL_COMMUNITY)
Admission: RE | Admit: 2017-06-07 | Payer: BLUE CROSS/BLUE SHIELD | Source: Ambulatory Visit | Admitting: Obstetrics & Gynecology

## 2017-06-07 ENCOUNTER — Encounter (HOSPITAL_COMMUNITY): Admission: RE | Payer: Self-pay | Source: Ambulatory Visit

## 2017-06-07 SURGERY — HYSTERECTOMY, ABDOMINAL, WITH SALPINGO-OOPHORECTOMY
Anesthesia: Choice

## 2017-06-14 ENCOUNTER — Other Ambulatory Visit: Payer: Self-pay | Admitting: Family Medicine

## 2017-06-14 DIAGNOSIS — Z1231 Encounter for screening mammogram for malignant neoplasm of breast: Secondary | ICD-10-CM

## 2017-06-30 ENCOUNTER — Telehealth: Payer: Self-pay | Admitting: Family Medicine

## 2017-06-30 NOTE — Telephone Encounter (Signed)
Will forward to [facility]- he is covering for PCP today.

## 2017-06-30 NOTE — Telephone Encounter (Signed)
Copied from [place]. Topic: General - Other >> Jun 30, 2017  1:03 PM Darl Householder, RMA wrote: Reason for CRM: Patient is requesting a call back concerning referral gastro

## 2017-07-02 ENCOUNTER — Ambulatory Visit (HOSPITAL_BASED_OUTPATIENT_CLINIC_OR_DEPARTMENT_OTHER): Payer: Self-pay

## 2017-07-06 ENCOUNTER — Encounter: Payer: Self-pay | Admitting: Family Medicine

## 2017-07-06 NOTE — Telephone Encounter (Signed)
Called her and LMOM- just getting this message today. Will also send her a mychart message- ?how can I help

## 2017-07-10 NOTE — Progress Notes (Signed)
error 

## 2017-07-11 ENCOUNTER — Ambulatory Visit (HOSPITAL_BASED_OUTPATIENT_CLINIC_OR_DEPARTMENT_OTHER): Payer: Self-pay

## 2017-07-11 ENCOUNTER — Ambulatory Visit (INDEPENDENT_AMBULATORY_CARE_PROVIDER_SITE_OTHER): Payer: BLUE CROSS/BLUE SHIELD | Admitting: Family Medicine

## 2017-07-11 ENCOUNTER — Encounter: Payer: Self-pay | Admitting: Family Medicine

## 2017-07-11 VITALS — BP 120/70 | HR 97 | Temp 98.8°F | Resp 18 | Ht 66.5 in | Wt 271.0 lb

## 2017-07-11 DIAGNOSIS — R0789 Other chest pain: Secondary | ICD-10-CM | POA: Diagnosis not present

## 2017-07-11 DIAGNOSIS — I10 Essential (primary) hypertension: Secondary | ICD-10-CM | POA: Diagnosis not present

## 2017-07-11 DIAGNOSIS — Z0000 Encounter for general adult medical examination without abnormal findings: Secondary | ICD-10-CM

## 2017-07-11 DIAGNOSIS — Z5181 Encounter for therapeutic drug level monitoring: Secondary | ICD-10-CM | POA: Diagnosis not present

## 2017-07-11 DIAGNOSIS — N920 Excessive and frequent menstruation with regular cycle: Secondary | ICD-10-CM

## 2017-07-11 DIAGNOSIS — E559 Vitamin D deficiency, unspecified: Secondary | ICD-10-CM | POA: Diagnosis not present

## 2017-07-11 NOTE — Progress Notes (Addendum)
[place] at [facility] [address], [place], [place] [address] [phone]  Date:  07/11/2017   Name:  Christina [name]   DOB:  12-17-1965   MRN:  [id number]  PCP:  Darreld Mclean, MD    Chief Complaint: Annual Exam (Pt here for non-fasting physical. Sees gyn for pap smears, EKG 01/31/17, last tdap 03/30/17, mammogram 06/26/16 and scheduled for 07/16/17. )   History of Present Illness:  Christina [name] is a 52 y.o. very pleasant female patient who presents with the following:  CPE today History of obesity, HTN, pre-diabetes, vitamin D def She is seeing DR. Ihor Dow for menorrhagia and plans to have a hysterectomy as soon as her BP is under control  Lab Results  Component Value Date   HGBA1C 5.7 (H) 05/02/2017   BP Readings from Last 3 Encounters:  07/11/17 120/70  05/02/17 (!) 170/100  04/22/17 (!) 166/88   I last saw her on 05/02/17 for her BP Here today with persistently high blood pressure Will add hctz 12.5 to her losartan She is also taking 2 of her coreg in the am instead of doing BID dosing. explained that this will not work as well, she will change to BID, will take an extra coreg this evening to bring down her BP until she can start on the HCTZ tomorrow am   Will plan further follow- up pending labs. She is able to check her BP at home and will contact me in about one week with a report  Discussed colon cancer screening and she would like to have cologuard- ordered this for her today Will touch base with Dr. Marin Olp about her recent von willebrand's results as I am not sure how to interpret this   Patient Active Problem List   Diagnosis Date Noted  . Obesity 07/08/2016  . Essential hypertension 07/08/2016  . Pre-diabetes 06/24/2016  . Uterine fibroid   . Vitamin D deficiency   . Atypical chest pain   she had a negative cologuard test  Pap:3/18 Mammo: 2/18 Flu: done October 18  Her Bp looks good She  has been checking it at [facility] and has noted excellent readings- 120/70 She is tolerating her BP meds well right now   Her weight is up, but she has a hard time exercising due to persistent vaginal bleeding   She does need a GI referral for non- cardiac CP.  She has noted this pain for 3-4 years now, but becoming more persistent.  She would like to have an upper GI to evaluate her esophagus which is reasonable  Cardiology has evaluated her in details, she had coronary calcium scoring already  Her chest discomfort is non- exertional per cardiology evaluation. There was a thought of pulmonary HTN but his was not seen on echo follow-up  No relationship to any activity or to certain foods that she can determine  Dr. Acie Fredrickson 11/18 Coronary CT angiogram shows no evidence of artery disease. There was a suggestion that she might have pulmonary hypertension but this was not seen with echo. The echocardiogram shows normal left ventricular systolic function with mild diastolic dysfunction.  The pulmonary arteries were normal size.  Furthermore, the right ventricle has normal size and function.  Estimated PA pressure is 23 which is normal. At this point I would conclude that she has noncardiac chest pain.  She has gained some weight and wants to lose  Wt Readings from Last 3 Encounters:  07/11/17 271 lb (122.9 kg)  05/02/17 263 lb 3.2 oz (119.4 kg)  04/22/17 266 lb (120.7 kg)   Pulse Readings from Last 3 Encounters:  07/11/17 97  05/02/17 77  04/22/17 83     Past Medical History:  Diagnosis Date  . Chest pain   . Essential hypertension 07/08/2016  . Uterine fibroid   . Vitamin D deficiency     Past Surgical History:  Procedure Laterality Date  . lump removed from breast at age 65    . ovarian cyst removed at 52yr old      Social History   Tobacco Use  . Smoking status: Never Smoker  . Smokeless tobacco: Never Used  Substance Use Topics  . Alcohol use: No  . Drug use: No     Family History  Problem Relation Age of Onset  . Hypertension Mother   . Dementia Father   . Colon polyps Neg Hx   . Crohn's disease Neg Hx   . Rectal cancer Neg Hx   . Stomach cancer Neg Hx     Allergies  Allergen Reactions  . Lisinopril Cough    Medication list has been reviewed and updated.  Current Outpatient Medications on File Prior to Visit  Medication Sig Dispense Refill  . carvedilol (COREG) 6.25 MG tablet Take 1 tablet (6.25 mg total) by mouth 2 (two) times daily. (Patient taking differently: Take 6.25 mg by mouth. Take 2 tablets by mouth daily.) 180 tablet 3  . losartan-hydrochlorothiazide (HYZAAR) 100-12.5 MG tablet Take 1 tablet by mouth daily. 90 tablet 3  . megestrol (MEGACE) 40 MG tablet Take 1 tablet (40 mg total) by mouth 2 (two) times daily. Can increase to two tablets twice a day in the event of heavy bleeding (Patient taking differently: Take 40 mg by mouth See admin instructions. Take 40 mg by mouth daily. May take additional 40 mg in the event of heavy bleeding) 60 tablet 5   No current facility-administered medications on file prior to visit.     Review of Systems:  As per HPI- otherwise negative. No fever or chills No SOB CP is unchanged    Physical Examination: Vitals:   07/11/17 1405  BP: 120/70  Pulse: 97  Resp: 18  Temp: 98.8 F (37.1 C)  SpO2: 97%   Vitals:   07/11/17 1405  Weight: 271 lb (122.9 kg)  Height: 5' 6.5" (1.689 m)   Body mass index is 43.09 kg/m. Ideal Body Weight: Weight in (lb) to have BMI = 25: 156.9  GEN: WDWN, NAD, Non-toxic, A & O x 3, obese, otherwise looks well  HEENT: Atraumatic, Normocephalic. Neck supple. No masses, No LAD.  Bilateral TM wnl, oropharynx normal.  PEERL,EOMI.   Ears and Nose: No external deformity. CV: RRR, No M/G/R. No JVD. No thrill. No extra heart sounds. PULM: CTA B, no wheezes, crackles, rhonchi. No retractions. No resp. distress. No accessory muscle use. ABD: S, NT, ND, +BS. No  rebound. No HSM. EXTR: No c/c/e NEURO Normal gait.  PSYCH: Normally interactive. Conversant. Not depressed or anxious appearing.  Calm demeanor.    Assessment and Plan: Physical exam  Non-cardiac chest pain - Plan: Ambulatory referral to Gastroenterology  Medication monitoring encounter - Plan: CANCELED: Basic metabolic panel  Vitamin D deficiency - Plan: Vitamin D (25 hydroxy)  Menorrhagia with regular cycle - Plan: Basic metabolic panel  Essential hypertension  Here today for a CPE Referral to GI for possible upper GI for non- cardiac CP Vitamin  D, BMP pending today She will be having a hysterectomy for menorrhagia hopefully soon- her BP is now controlled so she will be able to get her operation  Signed Lamar Blinks, MD  Received her labs 3/12- message to pt  Your metabolic profile is normal However your vitamin D is quite low!  I am going to rx a high dose WEEKLY vitamin D supplement for you.  Take this for 12 weeks, then change to an OTC supplement to take daily (1000 iu daily)  Let me know if any questions!  Let's visit in 2-3 months to check on your blood pressure  Results for orders placed or performed in visit on 07/11/17  Vitamin D (25 hydroxy)  Result Value Ref Range   VITD 9.16 (L) 30.00 - 100.00 ng/mL  Basic metabolic panel  Result Value Ref Range   Sodium 138 135 - 145 mEq/L   Potassium 3.6 3.5 - 5.1 mEq/L   Chloride 104 96 - 112 mEq/L   CO2 25 19 - 32 mEq/L   Glucose, Bld 91 70 - 99 mg/dL   BUN 14 6 - 23 mg/dL   Creatinine, Ser 0.73 0.40 - 1.20 mg/dL   Calcium 9.7 8.4 - 10.5 mg/dL   GFR 107.07 >60.00 mL/min

## 2017-07-11 NOTE — Patient Instructions (Signed)
Great to see you today!  I will be in touch with your labs asap Your BP looks great today!  Continue your current BP meds. I think you should be able to schedule your operation now   Health Maintenance, Female Adopting a healthy lifestyle and getting preventive care can go a long way to promote health and wellness. Talk with your health care provider about what schedule of regular examinations is right for you. This is a good chance for you to check in with your provider about disease prevention and staying healthy. In between checkups, there are plenty of things you can do on your own. Experts have done a lot of research about which lifestyle changes and preventive measures are most likely to keep you healthy. Ask your health care provider for more information. Weight and diet Eat a healthy diet  Be sure to include plenty of vegetables, fruits, low-fat dairy products, and lean protein.  Do not eat a lot of foods high in solid fats, added sugars, or salt.  Get regular exercise. This is one of the most important things you can do for your health. ? Most adults should exercise for at least 150 minutes each week. The exercise should increase your heart rate and make you sweat (moderate-intensity exercise). ? Most adults should also do strengthening exercises at least twice a week. This is in addition to the moderate-intensity exercise.  Maintain a healthy weight  Body mass index (BMI) is a measurement that can be used to identify possible weight problems. It estimates body fat based on height and weight. Your health care provider can help determine your BMI and help you achieve or maintain a healthy weight.  For females 54 years of age and older: ? A BMI below 18.5 is considered underweight. ? A BMI of 18.5 to 24.9 is normal. ? A BMI of 25 to 29.9 is considered overweight. ? A BMI of 30 and above is considered obese.  Watch levels of cholesterol and blood lipids  You should start having  your blood tested for lipids and cholesterol at 54 years of age, then have this test every 5 years.  You may need to have your cholesterol levels checked more often if: ? Your lipid or cholesterol levels are high. ? You are older than 54 years of age. ? You are at high risk for heart disease.  Cancer screening Lung Cancer  Lung cancer screening is recommended for adults 76-35 years old who are at high risk for lung cancer because of a history of smoking.  A yearly low-dose CT scan of the lungs is recommended for people who: ? Currently smoke. ? Have quit within the past 15 years. ? Have at least a 30-pack-year history of smoking. A pack year is smoking an average of one pack of cigarettes a day for 1 year.  Yearly screening should continue until it has been 15 years since you quit.  Yearly screening should stop if you develop a health problem that would prevent you from having lung cancer treatment.  Breast Cancer  Practice breast self-awareness. This means understanding how your breasts normally appear and feel.  It also means doing regular breast self-exams. Let your health care provider know about any changes, no matter how small.  If you are in your 20s or 30s, you should have a clinical breast exam (CBE) by a health care provider every 1-3 years as part of a regular health exam.  If you are 40 or older, have  a CBE every year. Also consider having a breast X-ray (mammogram) every year.  If you have a family history of breast cancer, talk to your health care provider about genetic screening.  If you are at high risk for breast cancer, talk to your health care provider about having an MRI and a mammogram every year.  Breast cancer gene (BRCA) assessment is recommended for women who have family members with BRCA-related cancers. BRCA-related cancers include: ? Breast. ? Ovarian. ? Tubal. ? Peritoneal cancers.  Results of the assessment will determine the need for genetic  counseling and BRCA1 and BRCA2 testing.  Cervical Cancer Your health care provider may recommend that you be screened regularly for cancer of the pelvic organs (ovaries, uterus, and vagina). This screening involves a pelvic examination, including checking for microscopic changes to the surface of your cervix (Pap test). You may be encouraged to have this screening done every 3 years, beginning at age 60.  For women ages 51-65, health care providers may recommend pelvic exams and Pap testing every 3 years, or they may recommend the Pap and pelvic exam, combined with testing for human papilloma virus (HPV), every 5 years. Some types of HPV increase your risk of cervical cancer. Testing for HPV may also be done on women of any age with unclear Pap test results.  Other health care providers may not recommend any screening for nonpregnant women who are considered low risk for pelvic cancer and who do not have symptoms. Ask your health care provider if a screening pelvic exam is right for you.  If you have had past treatment for cervical cancer or a condition that could lead to cancer, you need Pap tests and screening for cancer for at least 20 years after your treatment. If Pap tests have been discontinued, your risk factors (such as having a new sexual partner) need to be reassessed to determine if screening should resume. Some women have medical problems that increase the chance of getting cervical cancer. In these cases, your health care provider may recommend more frequent screening and Pap tests.  Colorectal Cancer  This type of cancer can be detected and often prevented.  Routine colorectal cancer screening usually begins at 54 years of age and continues through 54 years of age.  Your health care provider may recommend screening at an earlier age if you have risk factors for colon cancer.  Your health care provider may also recommend using home test kits to check for hidden blood in the  stool.  A small camera at the end of a tube can be used to examine your colon directly (sigmoidoscopy or colonoscopy). This is done to check for the earliest forms of colorectal cancer.  Routine screening usually begins at age 54.  Direct examination of the colon should be repeated every 5-10 years through 54 years of age. However, you may need to be screened more often if early forms of precancerous polyps or small growths are found.  Skin Cancer  Check your skin from head to toe regularly.  Tell your health care provider about any new moles or changes in moles, especially if there is a change in a mole's shape or color.  Also tell your health care provider if you have a mole that is larger than the size of a pencil eraser.  Always use sunscreen. Apply sunscreen liberally and repeatedly throughout the day.  Protect yourself by wearing long sleeves, pants, a wide-brimmed hat, and sunglasses whenever you are outside.  Heart  disease, diabetes, and high blood pressure  High blood pressure causes heart disease and increases the risk of stroke. High blood pressure is more likely to develop in: ? People who have blood pressure in the high end of the normal range (130-139/85-89 mm Hg). ? People who are overweight or obese. ? People who are African American.  If you are 86-35 years of age, have your blood pressure checked every 3-5 years. If you are 73 years of age or older, have your blood pressure checked every year. You should have your blood pressure measured twice-once when you are at a hospital or clinic, and once when you are not at a hospital or clinic. Record the average of the two measurements. To check your blood pressure when you are not at a hospital or clinic, you can use: ? An automated blood pressure machine at a pharmacy. ? A home blood pressure monitor.  If you are between 28 years and 37 years old, ask your health care provider if you should take aspirin to prevent  strokes.  Have regular diabetes screenings. This involves taking a blood sample to check your fasting blood sugar level. ? If you are at a normal weight and have a low risk for diabetes, have this test once every three years after 54 years of age. ? If you are overweight and have a high risk for diabetes, consider being tested at a younger age or more often. Preventing infection Hepatitis B  If you have a higher risk for hepatitis B, you should be screened for this virus. You are considered at high risk for hepatitis B if: ? You were born in a country where hepatitis B is common. Ask your health care provider which countries are considered high risk. ? Your parents were born in a high-risk country, and you have not been immunized against hepatitis B (hepatitis B vaccine). ? You have HIV or AIDS. ? You use needles to inject street drugs. ? You live with someone who has hepatitis B. ? You have had sex with someone who has hepatitis B. ? You get hemodialysis treatment. ? You take certain medicines for conditions, including cancer, organ transplantation, and autoimmune conditions.  Hepatitis C  Blood testing is recommended for: ? Everyone born from [birth year] through 1967. ? Anyone with known risk factors for hepatitis C.  Sexually transmitted infections (STIs)  You should be screened for sexually transmitted infections (STIs) including gonorrhea and chlamydia if: ? You are sexually active and are younger than 54 years of age. ? You are older than 54 years of age and your health care provider tells you that you are at risk for this type of infection. ? Your sexual activity has changed since you were last screened and you are at an increased risk for chlamydia or gonorrhea. Ask your health care provider if you are at risk.  If you do not have HIV, but are at risk, it may be recommended that you take a prescription medicine daily to prevent HIV infection. This is called pre-exposure prophylaxis  (PrEP). You are considered at risk if: ? You are sexually active and do not regularly use condoms or know the HIV status of your partner(s). ? You take drugs by injection. ? You are sexually active with a partner who has HIV.  Talk with your health care provider about whether you are at high risk of being infected with HIV. If you choose to begin PrEP, you should first be tested for HIV. You  should then be tested every 3 months for as long as you are taking PrEP. Pregnancy  If you are premenopausal and you may become pregnant, ask your health care provider about preconception counseling.  If you may become pregnant, take 400 to 800 micrograms (mcg) of folic acid every day.  If you want to prevent pregnancy, talk to your health care provider about birth control (contraception). Osteoporosis and menopause  Osteoporosis is a disease in which the bones lose minerals and strength with aging. This can result in serious bone fractures. Your risk for osteoporosis can be identified using a bone density scan.  If you are 69 years of age or older, or if you are at risk for osteoporosis and fractures, ask your health care provider if you should be screened.  Ask your health care provider whether you should take a calcium or vitamin D supplement to lower your risk for osteoporosis.  Menopause may have certain physical symptoms and risks.  Hormone replacement therapy may reduce some of these symptoms and risks. Talk to your health care provider about whether hormone replacement therapy is right for you. Follow these instructions at home:  Schedule regular health, dental, and eye exams.  Stay current with your immunizations.  Do not use any tobacco products including cigarettes, chewing tobacco, or electronic cigarettes.  If you are pregnant, do not drink alcohol.  If you are breastfeeding, limit how much and how often you drink alcohol.  Limit alcohol intake to no more than 1 drink per day for  nonpregnant women. One drink equals 12 ounces of beer, 5 ounces of wine, or 1 ounces of hard liquor.  Do not use street drugs.  Do not share needles.  Ask your health care provider for help if you need support or information about quitting drugs.  Tell your health care provider if you often feel depressed.  Tell your health care provider if you have ever been abused or do not feel safe at home. This information is not intended to replace advice given to you by your health care provider. Make sure you discuss any questions you have with your health care provider. Document Released: 11/02/2010 Document Revised: 09/25/2015 Document Reviewed: 01/21/2015 Elsevier Interactive Patient Education  [employer].

## 2017-07-12 ENCOUNTER — Encounter: Payer: Self-pay | Admitting: Family Medicine

## 2017-07-12 ENCOUNTER — Inpatient Hospital Stay: Admit: 2017-07-12 | Payer: BLUE CROSS/BLUE SHIELD | Admitting: Obstetrics & Gynecology

## 2017-07-12 LAB — BASIC METABOLIC PANEL
BUN: 14 mg/dL (ref 6–23)
CO2: 25 mEq/L (ref 19–32)
Calcium: 9.7 mg/dL (ref 8.4–10.5)
Chloride: 104 mEq/L (ref 96–112)
Creatinine, Ser: 0.73 mg/dL (ref 0.40–1.20)
GFR: 107.07 mL/min (ref >60.00)
Glucose, Bld: 91 mg/dL (ref 70–99)
Potassium: 3.6 mEq/L (ref 3.5–5.1)
Sodium: 138 mEq/L (ref 135–145)

## 2017-07-12 LAB — VITAMIN D 25 HYDROXY (VIT D DEFICIENCY, FRACTURES): VITD: 9.16 ng/mL — ABNORMAL LOW (ref 30.00–100.00)

## 2017-07-12 SURGERY — HYSTERECTOMY, ABDOMINAL, WITH SALPINGO-OOPHORECTOMY
Anesthesia: Choice | Laterality: Bilateral

## 2017-07-12 MED ORDER — VITAMIN D (ERGOCALCIFEROL) 1.25 MG (50000 UNIT) PO CAPS: 50000.0000 [IU] | ORAL_CAPSULE | ORAL | 0 refills | Status: DC

## 2017-07-12 NOTE — Addendum Note (Signed)
Addended by: Lamar Blinks C on: 07/12/2017 01:22 PM   Modules accepted: Orders

## 2017-07-16 ENCOUNTER — Ambulatory Visit (HOSPITAL_BASED_OUTPATIENT_CLINIC_OR_DEPARTMENT_OTHER): Payer: Self-pay

## 2017-07-18 ENCOUNTER — Telehealth: Payer: Self-pay | Admitting: Family Medicine

## 2017-07-18 NOTE — Telephone Encounter (Signed)
Copied from [place]. Topic: Quick Communication - See Telephone Encounter >> Jul 18, 2017  3:06 PM Arletha Grippe wrote: CRM for notification. See Telephone encounter for:   07/18/17. Pt requests call back about labs that she just saw on mychart. Please call [phone]

## 2017-07-20 NOTE — Telephone Encounter (Signed)
Tried to call pt back. No answer, left detailed message with provider's recommendation about lab results.

## 2017-07-22 ENCOUNTER — Encounter: Payer: Self-pay | Admitting: Family Medicine

## 2017-07-22 ENCOUNTER — Encounter: Payer: Self-pay | Admitting: Obstetrics & Gynecology

## 2017-07-23 ENCOUNTER — Ambulatory Visit (HOSPITAL_BASED_OUTPATIENT_CLINIC_OR_DEPARTMENT_OTHER)
Admission: RE | Admit: 2017-07-23 | Discharge: 2017-07-23 | Disposition: A | Discharge status: Home / Self Care | Payer: BLUE CROSS/BLUE SHIELD | Source: Ambulatory Visit | Attending: Family Medicine | Admitting: Family Medicine

## 2017-07-23 DIAGNOSIS — Z1231 Encounter for screening mammogram for malignant neoplasm of breast: Secondary | ICD-10-CM | POA: Diagnosis not present

## 2017-08-02 ENCOUNTER — Encounter: Payer: Self-pay | Admitting: Family Medicine

## 2017-08-02 ENCOUNTER — Ambulatory Visit (INDEPENDENT_AMBULATORY_CARE_PROVIDER_SITE_OTHER): Payer: BLUE CROSS/BLUE SHIELD | Admitting: Gastroenterology

## 2017-08-02 ENCOUNTER — Encounter: Payer: Self-pay | Admitting: Gastroenterology

## 2017-08-02 VITALS — BP 122/78 | HR 70 | Ht 67.0 in | Wt 268.0 lb

## 2017-08-02 DIAGNOSIS — R0789 Other chest pain: Secondary | ICD-10-CM

## 2017-08-02 DIAGNOSIS — R1310 Dysphagia, unspecified: Secondary | ICD-10-CM

## 2017-08-02 MED ORDER — SUCRALFATE 1 GM/10ML PO SUSP: 1.0000 g | Freq: Every day | ORAL | 0 refills | Status: DC | PRN

## 2017-08-02 MED ORDER — OMEPRAZOLE 40 MG PO CPDR: 40.0000 mg | DELAYED_RELEASE_CAPSULE | Freq: Two times a day (BID) | ORAL | 1 refills | Status: DC

## 2017-08-02 NOTE — Progress Notes (Signed)
HPI :  52 y/o pleasant female with a history of chronic chest pain, hypertension, fibroid uterus, referred by Dr. Janett Billow Copland for chest pain evaluation.  The patient endorses having intermittent chest pains for the past 6-7 years. She reports episodic discomfort which usually starts in her epigastric area as a dull pain, which then radiates up into her chest and into her jaw / throat. She denies this radiating down her arms or into her back. Initially the episodes are lasting 1 minute at a time and resolving on her own, now they can last upwards of 10-20 minutes at longest. They usually resolve without intervention but she is in significant discomfort when it occurs . She does not have any known precipitants for these episodes, and often occurs when she is sitting at work. She denies any exertional symptoms. She denies any heartburn, she denies any symptoms related to eating. She does have some occasional dysphagia to solids as well as associated odynophagia when this dysphagia occurs. She denies any nausea or vomiting. Outside of these episodes she denies any symptoms of bother her. Her last episode was in February. She thinks they usually occur about 1-2 times a month. She denies any associated shortness of breath. She states she has been given a trial of Prilosec as needed in the past, was using this as needed during an episode, however given episodes lasted a short amount of time is not sure if this is helped at all. She's had an ultrasound of her right upper quadrant which ruled out gallstones.  She's been seen by cardiology, has had EKG, echocardiogram, cardiac CT, was told this was not her heart causing her symptoms.she has tried nitroglycerin without any benefit.  She denies any bowel habit changes. She denies any blood in her stools. She denies any other abdominal pains.she denies any family history of colon cancer. She had a Cologuard in January which was negative.   Cardiac CT -  03/07/2017 - dilated pulmonary artery, otherwise no significant coronary calcium Echocardiogram 03/17/2017 - 55-60%  [place] RUQ 04/20/2017 - normal  Cologuard - 05/08/2017 - negative  Past Medical History:  Diagnosis Date  . Chest pain   . Essential hypertension 07/08/2016  . Uterine fibroid   . Vitamin D deficiency      Past Surgical History:  Procedure Laterality Date  . lump removed from breast at age 61    . ovarian cyst removed at 52yr old     Family History  Problem Relation Age of Onset  . Hypertension Mother   . Dementia Father   . Colon polyps Neg Hx   . Crohn's disease Neg Hx   . Rectal cancer Neg Hx   . Stomach cancer Neg Hx    Social History   Tobacco Use  . Smoking status: Never Smoker  . Smokeless tobacco: Never Used  Substance Use Topics  . Alcohol use: No  . Drug use: No   Current Outpatient Medications  Medication Sig Dispense Refill  . carvedilol (COREG) 6.25 MG tablet Take 1 tablet (6.25 mg total) by mouth 2 (two) times daily. (Patient taking differently: Take 6.25 mg by mouth. Take 2 tablets by mouth daily.) 180 tablet 3  . losartan-hydrochlorothiazide (HYZAAR) 100-12.5 MG tablet Take 1 tablet by mouth daily. 90 tablet 3  . megestrol (MEGACE) 40 MG tablet Take 1 tablet (40 mg total) by mouth 2 (two) times daily. Can increase to two tablets twice a day in the event of heavy bleeding (Patient taking  differently: Take 40 mg by mouth See admin instructions. Take 40 mg by mouth daily. May take additional 40 mg in the event of heavy bleeding) 60 tablet 5  . Vitamin D, Ergocalciferol, (DRISDOL) 50000 units CAPS capsule Take 1 capsule (50,000 Units total) by mouth every 7 (seven) days. 12 capsule 0   No current facility-administered medications for this visit.    Allergies  Allergen Reactions  . Lisinopril Cough     Review of Systems: All systems reviewed and negative except where noted in HPI.   Lab Results  Component Value Date   WBC 6.7 04/22/2017    HGB 13.4 04/22/2017   HCT 41.3 04/22/2017   MCV 89 04/22/2017   PLT 252 04/22/2017    Lab Results  Component Value Date   CREATININE 0.73 07/11/2017   BUN 14 07/11/2017   NA 138 07/11/2017   K 3.6 07/11/2017   CL 104 07/11/2017   CO2 25 07/11/2017    Lab Results  Component Value Date   ALT 8 01/31/2017   AST 11 01/31/2017   ALKPHOS 76 01/31/2017   BILITOT 0.3 01/31/2017      Physical Exam: BP 122/78   Pulse 70   Ht 5\' 7"  (1.702 m)   Wt 268 lb (121.6 kg)   BMI 41.97 kg/m  Constitutional: Pleasant,well-developed, female in no acute distress. HEENT: Normocephalic and atraumatic. Conjunctivae are normal. No scleral icterus. Neck supple.  Cardiovascular: Normal rate, regular rhythm.  Pulmonary/chest: Effort normal and breath sounds normal. No wheezing, rales or rhonchi. Abdominal: Soft, protuberant, nontender.  There are no masses palpable. No hepatomegaly. Extremities: no edema Lymphadenopathy: No cervical adenopathy noted. Neurological: Alert and oriented to person place and time. Skin: Skin is warm and dry. No rashes noted. Psychiatric: Normal mood and affect. Behavior is normal.   ASSESSMENT AND PLAN: 52 year old female with history as outlined above, referred for further evaluation of chest pain as outlined.  Atypical chest pain - she's had an extensive cardiac workup which is negative, and this is reassuring. It is possible that her symptoms are esophageal in etiology - etiologies include reflux disease versus esophageal spasm, while eosinophilic esophagitis is also possible. I'm recommending an upper endoscopy to further evaluate her esophagus in this light. I discussed what an endoscopy entails and the risks and benefits of endoscopy with her and she wanted to proceed. In the interim I recommend a trial of a high-dose PPI every day for a few weeks to see if this can prevent future recurrence of episodes. Recommend she take omeprazole 40 mg twice daily for 4 weeks.  In the interim if she does get an episode, I provided her a sample of liquid Carafate she can take to see if this helps at all to abort the episode. Otherwise, if he is negative and her symptoms persist despite trial of medications is recommended, we may consider esophageal manometry to more formally assess for motility disorder. She agreed the plan as outlined.  Dysphagia - intermittent solid food dysphagia as above, this will be evaluated with upper endoscopy as outlined.  [place] Cellar, MD [facility]   CC: Copland, Gay Filler, MD

## 2017-08-02 NOTE — Patient Instructions (Signed)
If you are age 54 or older, your body mass index should be between 23-30. Your Body mass index is 41.97 kg/m. If this is out of the aforementioned range listed, please consider follow up with your Primary Care Provider.  If you are age 21 or younger, your body mass index should be between 19-25. Your Body mass index is 41.97 kg/m. If this is out of the aformentioned range listed, please consider follow up with your Primary Care Provider.   You have been scheduled for an endoscopy. Please follow written instructions given to you at your visit today. If you use inhalers (even only as needed), please bring them with you on the day of your procedure. Your physician has requested that you go to www.startemmi.com and enter the access code given to you at your visit today. This web site gives a general overview about your procedure. However, you should still follow specific instructions given to you by our office regarding your preparation for the procedure.  We have sent the following medications to your pharmacy for you to pick up at your convenience: Omeprazole 40mg : Take twice a day  We have given you samples of the following medication to take: Carafate: Take as needed  Thank you for entrusting me with your care and for choosing [facility], Dr. [place] Cellar

## 2017-08-03 ENCOUNTER — Encounter: Payer: Self-pay | Admitting: Gastroenterology

## 2017-08-12 ENCOUNTER — Encounter: Payer: Self-pay | Admitting: Gastroenterology

## 2017-09-09 ENCOUNTER — Ambulatory Visit: Payer: Self-pay | Admitting: *Deleted

## 2017-09-09 ENCOUNTER — Encounter: Payer: Self-pay | Admitting: Gastroenterology

## 2017-09-09 NOTE — Telephone Encounter (Signed)
She called in c/o chest pain in the right side of her back, behind her right breast that is sharp and comes and goes.  This has been going on for 2-3 weeks.  This pain happens mostly at night.    She has had a complete work up by a cardiologist that found everything normal.   She saw a GI doctor and was suppose to have an endoscopy.   He did a work up but also wanted to do an endoscopy.    She cancelled the endoscopy and follow up appt with the GI doctor because she "wants to see a different doctor".     Has not made an appt with another GI doctor at this time.  When I asked her what she thought was causing the pain she said,  "I think it might be my lungs".   When I asked why she replied,  "Well, everything else has been checked".    "I'm just guessing".  When asked if she had been sick with a cold or upper respiratory symptoms that is making her think it's her lungs she replied,  "No".     I let her know per our protocol  I would normally refer her to the ED.  She opted to wait until her appt with Dr. Janett Billow Copland on 09/14/17 at 3:45.  I went over all the cardiac s/s for her to be aware of and to go to the ED if they occur.  She verbalized understanding and was agreeable to this plan. Reason for Disposition . [1] Chest pain(s) lasting a few seconds AND [2] persists > 3 days  Answer Assessment - Initial Assessment Questions 1. LOCATION: "Where does it hurt?"       This is a different pain starting in my right side in the back and behind my right breast.    It's sharp pain that comes and goes.    2. RADIATION: "Does the pain go anywhere else?" (e.g., into neck, jaw, arms, back)     No 3. ONSET: "When did the chest pain begin?" (Minutes, hours or days)      2-3 weeks ago. 4. PATTERN "Does the pain come and go, or has it been constant since it started?"  "Does it get worse with exertion?"      Comes and goes 5. DURATION: "How long does it last" (e.g., seconds, minutes, hours)     Depends.    Comes at night mostly.  6. SEVERITY: "How bad is the pain?"  (e.g., Scale 1-10; mild, moderate, or severe)    - MILD (1-3): doesn't interfere with normal activities     - MODERATE (4-7): interferes with normal activities or awakens from sleep    - SEVERE (8-10): excruciating pain, unable to do any normal activities       8 on pain scale when sharp occurs.   Feels like someone is sticking me with a pin under my right breast and into my back. 7. CARDIAC RISK FACTORS: "Do you have any history of heart problems or risk factors for heart disease?" (e.g., prior heart attack, angina; high blood pressure, diabetes, being overweight, high cholesterol, smoking, or strong family history of heart disease)     Hypertension.    Seen cardiologist everything was normal.    I was supposed to have an endoscopy but I cancelled the test because I want to use another GI doctor.    8. PULMONARY RISK FACTORS: "Do you have any history of lung  disease?"  (e.g., blood clots in lung, asthma, emphysema, birth control pills)     None of the above 9. CAUSE: "What do you think is causing the chest pain?"     I think it's my lungs.   I cough all the time but that's normal for me.    10. OTHER SYMPTOMS: "Do you have any other symptoms?" (e.g., dizziness, nausea, vomiting, sweating, fever, difficulty breathing, cough)       Just a cough that I have all the time for a long time. 11. PREGNANCY: "Is there any chance you are pregnant?" "When was your last menstrual period?"       No  Protocols used: CHEST PAIN-A-AH

## 2017-09-14 ENCOUNTER — Ambulatory Visit: Payer: BLUE CROSS/BLUE SHIELD | Admitting: Family Medicine

## 2017-09-28 ENCOUNTER — Other Ambulatory Visit: Payer: Self-pay

## 2017-09-28 DIAGNOSIS — N939 Abnormal uterine and vaginal bleeding, unspecified: Secondary | ICD-10-CM

## 2017-09-28 MED ORDER — MEGESTROL ACETATE 40 MG PO TABS: 40.0000 mg | ORAL_TABLET | Freq: Two times a day (BID) | ORAL | 5 refills | Status: DC

## 2017-09-28 NOTE — Progress Notes (Signed)
Pt called the office requesting a refill of Megace. Megage 40mg  refill was sent in Per Dr. Ihor Dow.

## 2017-10-05 ENCOUNTER — Ambulatory Visit: Payer: Self-pay | Admitting: *Deleted

## 2017-10-05 NOTE — Telephone Encounter (Signed)
Pt  Reports pain r side of chest - pain radiates to throat and jaw  - currently has pain for about 1 hour at this time. Pt advised to go to nearest Er .   Reason for Disposition . Chest pain lasts > 5 minutes (Exceptions: chest pain occurring > 3 days ago and now asymptomatic; same as previously diagnosed heartburn and has accompanying sour taste in mouth)  Answer Assessment - Initial Assessment Questions 1. LOCATION: "Where does it hurt?"         Under R  BREAST  2. RADIATION: "Does the pain go anywhere else?" (e.g., into neck, jaw, arms, back)      Pain radiates to esophagus into jaw    3. ONSET: "When did the chest pain begin?" (Minutes, hours or days)        Patient 8 hours and has three episodes   4. PATTERN "Does the pain come and go, or has it been constant since it started?"  "Does it get worse with exertion?"         Does  Comes and goes   5. DURATION: "How long does it last" (e.g., seconds, minutes, hours)          Hurting as we speak  For 1  Hour  6. SEVERITY: "How bad is the pain?"  (e.g., Scale 1-10; mild, moderate, or severe)    - MILD (1-3): doesn't interfere with normal activities     - MODERATE (4-7): interferes with normal activities or awakens from sleep    - SEVERE (8-10): excruciating pain, unable to do any normal activities          Mod  7. CARDIAC RISK FACTORS: "Do you have any history of heart problems or risk factors for heart disease?" (e.g., prior heart attack, angina; high blood pressure, diabetes, being overweight, high cholesterol, smoking, or strong family history of heart disease)      htn   8. PULMONARY RISK FACTORS: "Do you have any history of lung disease?"  (e.g., blood clots in lung, asthma, emphysema, birth control pills)         No  9. CAUSE: "What do you think is causing the chest pain?"         no 10. OTHER SYMPTOMS: "Do you have any other symptoms?" (e.g., dizziness, nausea, vomiting, sweating, fever, difficulty breathing, cough)       no 11.  PREGNANCY: "Is there any chance you are pregnant?" "When was your last menstrual period?"       n/a  Protocols used: CHEST PAIN-A-AH

## 2017-10-05 NOTE — Telephone Encounter (Signed)
Dr Lorelei Pont -- Looks like pt was advised ER evaluation but she has been scheduled on Dr Larose Kells for Friday for her chest pain?

## 2017-10-05 NOTE — Telephone Encounter (Signed)
Ok- not clear from [facility] documentation but it seems she refused ER and scheduled an OV

## 2017-10-07 ENCOUNTER — Ambulatory Visit: Payer: BLUE CROSS/BLUE SHIELD | Admitting: Internal Medicine

## 2017-10-07 ENCOUNTER — Ambulatory Visit: Payer: Self-pay | Admitting: Internal Medicine

## 2017-10-10 ENCOUNTER — Telehealth: Payer: Self-pay

## 2017-10-10 NOTE — Telephone Encounter (Signed)
Patient calling concerned about the pap done in March 2018 by Dr. 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   Patient asking how she got it and I informed her it is sexually transmitted. Patient then asking if we could know who gave it to her and I informed her that there is not a way to pinpoint exactly who gave the HPV to her.  Patient states should she be worried. She is planning to reschedule her hysterectomy in the beginning of the 2020 due to having to have a certain amount of time off from work. Patient also made aware she can schedule a annual exam for the year to discuss this with Dr. Ihor Dow. Patient scheduled annual in July 2019. Kathrene Alu RN

## 2017-10-19 ENCOUNTER — Encounter: Payer: Self-pay | Admitting: Family Medicine

## 2017-10-19 NOTE — Telephone Encounter (Signed)
Christina [name]- please call her.  I am glad to see her and do a chest x-ray.  Likely can see her even tomorrow.  However if she is having active chest pains she should go to the ER now

## 2017-10-19 NOTE — Telephone Encounter (Signed)
Talked to patient and she reported the last time she had the chest pain was last week. Per Dr. Wylene Simmer I gave her an appointment for tomorrow, she will have the chest x ray tomorrow.

## 2017-10-20 ENCOUNTER — Encounter: Payer: Self-pay | Admitting: Family Medicine

## 2017-10-20 ENCOUNTER — Ambulatory Visit (INDEPENDENT_AMBULATORY_CARE_PROVIDER_SITE_OTHER): Payer: BLUE CROSS/BLUE SHIELD | Admitting: Family Medicine

## 2017-10-20 ENCOUNTER — Ambulatory Visit (HOSPITAL_BASED_OUTPATIENT_CLINIC_OR_DEPARTMENT_OTHER)
Admission: RE | Admit: 2017-10-20 | Discharge: 2017-10-20 | Disposition: A | Discharge status: Home / Self Care | Payer: BLUE CROSS/BLUE SHIELD | Source: Ambulatory Visit | Attending: Family Medicine | Admitting: Family Medicine

## 2017-10-20 VITALS — BP 142/84 | HR 80 | Temp 99.0°F | Resp 16 | Ht 67.0 in | Wt 275.4 lb

## 2017-10-20 DIAGNOSIS — R079 Chest pain, unspecified: Secondary | ICD-10-CM | POA: Diagnosis not present

## 2017-10-20 DIAGNOSIS — R0789 Other chest pain: Secondary | ICD-10-CM

## 2017-10-20 DIAGNOSIS — R072 Precordial pain: Secondary | ICD-10-CM | POA: Diagnosis not present

## 2017-10-20 DIAGNOSIS — J9811 Atelectasis: Secondary | ICD-10-CM | POA: Diagnosis not present

## 2017-10-20 NOTE — Progress Notes (Signed)
[facility] [address], [place],  [address] [phone]  Date:  10/20/2017   Name:  Christina [name]   DOB:  [birth year]   MRN:  [id number]  PCP:  Darreld Mclean, MD    Chief Complaint: Chest Pain (since 2009- random- last about 20-30 minutes, under R breast)   History of Present Illness:  Christina [name] is a 52 y.o. very pleasant female patient who presents with the following:  From our last visit in March: She does need a GI referral for non- cardiac CP.  She has noted this pain for 3-4 years now, but becoming more persistent. She would like to have an upper GI to evaluate her esophagus which is reasonable  Cardiology has evaluated her in details, she had coronary calcium scoring already  Her chest discomfort is non- exertional per cardiology evaluation. There was a thought of pulmonary HTN but his was not seen on echo follow-up  No relationship to any activity or to certain foods that she can determine Dr. Acie Fredrickson 11/18 Coronary CT angiogram shows no evidence of artery disease. There was a suggestion that she might have pulmonary hypertension but this was not seen with echo. The echocardiogram shows normal left ventricular systolic function with mild diastolic dysfunction. The pulmonary arteries were normal size. Furthermore, the right ventricle has normal size and function. Estimated PA pressure is 23 which is normal. At this point I would conclude that she has noncardiac chest pain.  She has gained some weight and wants to lose  She did see Dr. Havery Moros and she is getting an upper GI in about 3 weeks She notes that she continues to have this pain as described below It will start under her right breast, and will then move up her chest all the way into her neck and her gums  She did se Dr. Havery Moros in April-  She reports that she tried the high dose PPI but it did not help  Atypical chest pain - she's  had an extensive cardiac workup which is negative, and this is reassuring. It is possible that her symptoms are esophageal in etiology - etiologies include reflux disease versus esophageal spasm, while eosinophilic esophagitis is also possible. I'm recommending an upper endoscopy to further evaluate her esophagus in this light. I discussed what an endoscopy entails and the risks and benefits of endoscopy with her and she wanted to proceed. In the interim I recommend a trial of a high-dose PPI every day for a few weeks to see if this can prevent future recurrence of episodes. Recommend she take omeprazole 40 mg twice daily for 4 weeks. In the interim if she does get an episode, I provided her a sample of liquid Carafate she can take to see if this helps at all to abort the episode. Otherwise, if he is negative and her symptoms persist despite trial of medications is recommended, we may consider esophageal manometry to more formally assess for motility disorder. She agreed the plan as outlined.  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 Obesity 07/08/2016  . Essential hypertension 07/08/2016  . Pre-diabetes 06/24/2016  . Uterine fibroid   . Vitamin D deficiency   . Atypical chest pain     Past Medical History:  Diagnosis Date  . Chest pain   . Essential hypertension 07/08/2016  . Uterine fibroid   . Vitamin D deficiency     Past Surgical History:  Procedure Laterality Date  . lump removed from breast at age 23    . ovarian cyst removed at 52yr old      Social History   Tobacco Use  . Smoking status:  Never Smoker  . Smokeless tobacco: Never Used  Substance Use Topics  . Alcohol use: No  . Drug use: No    Family History  Problem Relation Age of Onset  . Hypertension Mother   . Dementia Father   . Colon polyps Neg Hx   . Crohn's disease Neg Hx   . Rectal cancer Neg Hx   . Stomach cancer Neg Hx     Allergies  Allergen Reactions  . Lisinopril Cough    Medication list has been reviewed and updated.  Current Outpatient Medications on File Prior to Visit  Medication Sig Dispense Refill  . carvedilol (COREG) 6.25 MG tablet Take 1 tablet (6.25 mg total) by mouth 2 (two) times daily. (Patient taking differently: Take 6.25 mg by mouth. Take 2 tablets by mouth daily.) 180 tablet 3  . losartan-hydrochlorothiazide (HYZAAR) 100-12.5 MG tablet Take 1 tablet by mouth daily. 90 tablet 3  . megestrol (MEGACE) 40 MG tablet Take 1 tablet (40 mg total) by mouth 2 (two) times daily. 40 mg daily. May take additional 40 mg in the event of heavy bleeding. 60 tablet 5  . omeprazole (PRILOSEC) 40 MG capsule Take 1 capsule (40 mg total) by mouth 2 (two) times daily. 30 capsule 1  . sucralfate (CARAFATE) 1 GM/10ML suspension Take 10 mLs (1 g total) by mouth daily as needed. 60 mL 0  . Vitamin D, Ergocalciferol, (DRISDOL) 50000 units CAPS capsule Take 1 capsule (50,000 Units total) by mouth every 7 (seven) days. 12 capsule 0   No current facility-administered medications on file prior to visit.     Review of Systems:  As per HPI- otherwise negative. No fever or chills    Physical Examination: Vitals:   10/20/17 1627  BP: (!) 142/84  Pulse: (!) 102  Resp: 16  Temp: 99 F (37.2 C)  SpO2: 98%   Vitals:   10/20/17 1627  Weight: 275 lb 6 oz (124.9 kg)  Height: 5\' 7"  (1.702 m)   Body mass index is 43.13 kg/m. Ideal Body Weight: Weight in (lb) to have BMI = 25: 159.3  GEN: WDWN, NAD, Non-toxic, A & O x 3 HEENT: Atraumatic, Normocephalic. Neck supple. No masses, No LAD.  Bilateral TM wnl,  oropharynx normal.  PEERL,EOMI.   Ears and Nose: No external deformity. CV: RRR, No M/G/R. No JVD. No thrill. No extra heart sounds. PULM: CTA B, no wheezes, crackles, rhonchi. No retractions. No resp. distress. No accessory muscle use. ABD: S, NT, ND, +BS. No rebound. No HSM. EXTR: No c/c/e NEURO Normal gait.  PSYCH: Normally interactive. Conversant. Not depressed or anxious appearing.  Calm demeanor.  Obese, looks well She indicates that her pains will originate under the right breast and then migrate up the middle of her chest into her teeth and jaw.  This  area is not tender however  Assessment and Plan: Non-cardiac chest pain - Plan: DG Chest 2 View  Precordial pain - Plan: CT Chest Wo Contrast  Here today with complaint of pain in her chest which has been present for yeas but is getting worse over the last several months.  She has undergone extensive cardiac eval, all ok, and is seeing GI for endoscopy next month,  She is starting to get really worried about this pain.  Will obtain a CXR today and likely will pursue a CT for her as well   Signed Lamar Blinks, MD  Dg Chest 2 View  Result Date: 10/20/2017 CLINICAL DATA:  RIGHT side chest pain radiating towards jaw since 2009, noncardiac chest pain, history essential hypertension EXAM: CHEST - 2 VIEW COMPARISON:  None FINDINGS: Enlargement of cardiac silhouette. Mediastinal contours and pulmonary vascularity normal. Minimal peribronchial thickening with questionable medial RIGHT lung base atelectasis. No acute infiltrate, pleural effusion or pneumothorax. Bones unremarkable. IMPRESSION: Mild enlargement of cardiac silhouette. Minimal bronchitic changes with medial RIGHT lung base subsegmental atelectasis. Electronically Signed   By: Lavonia Dana M.D.   On: 10/20/2017 17:14   Received her CXR. Message to pt.  Will order a CT as well

## 2017-10-20 NOTE — Progress Notes (Signed)
Pre visit review using our clinic review tool, if applicable. No additional management support is needed unless otherwise documented below in the visit note. 

## 2017-10-20 NOTE — Patient Instructions (Signed)
I will be in touch with your chest film asap- we will likely plan to do a CT scan as well

## 2017-10-25 ENCOUNTER — Encounter: Payer: Self-pay | Admitting: Family Medicine

## 2017-11-01 ENCOUNTER — Telehealth: Payer: Self-pay

## 2017-11-01 ENCOUNTER — Encounter: Payer: Self-pay | Admitting: Family Medicine

## 2017-11-01 ENCOUNTER — Telehealth: Payer: Self-pay | Admitting: Family Medicine

## 2017-11-01 NOTE — Telephone Encounter (Signed)
-----   Message from [facility] sent at 11/01/2017  2:42 PM EDT ----- It was denied, needs medical necessity, somehow it went straight to pre cert team again and not me, but you can call [phone] and provide the information and get the approval. [id number] DOB Jun 16, 2065 ----- Message ----- From: Darreld Mclean, MD Sent: 11/01/2017  12:31 PM To: [place] ordered a Ct of her chest on 6/20.  Pt was wondering when this would be done, I called the imaging dept and it looks like the pre-cert is pending.  Can you please let me know what I need to do/ who I should call to do peer to peer or whatever else is needed? Thank you!

## 2017-11-01 NOTE — Telephone Encounter (Signed)
CT chest without contrast was ordered on 10/20/17. Please advise.

## 2017-11-01 NOTE — Telephone Encounter (Signed)
Pt called the office to cancel her appt scheduled on 11/09/17.

## 2017-11-01 NOTE — Telephone Encounter (Signed)
Got the following message from [facility] regarding the CT chest that I had ordered:   It was denied, needs medical necessity, somehow it went straight to pre cert team again and not me, but you can call [phone] and provide the information and get the approval. MM#[id number] DOB 04/22/2066   I called, offered additional info and got approval Lowella Curb number [phone] good at [facility]

## 2017-11-09 ENCOUNTER — Ambulatory Visit: Payer: Self-pay | Admitting: Obstetrics & Gynecology

## 2017-11-12 ENCOUNTER — Ambulatory Visit (HOSPITAL_BASED_OUTPATIENT_CLINIC_OR_DEPARTMENT_OTHER)
Admission: RE | Admit: 2017-11-12 | Discharge: 2017-11-12 | Disposition: A | Discharge status: Home / Self Care | Payer: BLUE CROSS/BLUE SHIELD | Source: Ambulatory Visit | Attending: Family Medicine | Admitting: Family Medicine

## 2017-11-12 DIAGNOSIS — I700 Atherosclerosis of aorta: Secondary | ICD-10-CM | POA: Diagnosis not present

## 2017-11-12 DIAGNOSIS — R072 Precordial pain: Secondary | ICD-10-CM | POA: Diagnosis not present

## 2017-11-12 DIAGNOSIS — I2510 Atherosclerotic heart disease of native coronary artery without angina pectoris: Secondary | ICD-10-CM | POA: Insufficient documentation

## 2017-11-12 DIAGNOSIS — R918 Other nonspecific abnormal finding of lung field: Secondary | ICD-10-CM | POA: Insufficient documentation

## 2017-11-12 DIAGNOSIS — D3502 Benign neoplasm of left adrenal gland: Secondary | ICD-10-CM | POA: Insufficient documentation

## 2017-11-12 DIAGNOSIS — R0602 Shortness of breath: Secondary | ICD-10-CM | POA: Diagnosis not present

## 2017-11-13 ENCOUNTER — Encounter: Payer: Self-pay | Admitting: Family Medicine

## 2017-11-14 ENCOUNTER — Encounter: Payer: Self-pay | Admitting: Family Medicine

## 2017-11-14 DIAGNOSIS — R918 Other nonspecific abnormal finding of lung field: Secondary | ICD-10-CM

## 2017-11-17 ENCOUNTER — Encounter: Payer: Self-pay | Admitting: Gastroenterology

## 2017-11-18 ENCOUNTER — Other Ambulatory Visit (HOSPITAL_BASED_OUTPATIENT_CLINIC_OR_DEPARTMENT_OTHER): Payer: Self-pay

## 2017-12-05 ENCOUNTER — Other Ambulatory Visit: Payer: Self-pay

## 2017-12-12 ENCOUNTER — Encounter: Payer: Self-pay | Admitting: Family Medicine

## 2017-12-23 ENCOUNTER — Ambulatory Visit (AMBULATORY_SURGERY_CENTER): Payer: Self-pay | Admitting: *Deleted

## 2017-12-23 ENCOUNTER — Other Ambulatory Visit: Payer: Self-pay

## 2017-12-23 VITALS — Ht 67.0 in | Wt 275.6 lb

## 2017-12-23 DIAGNOSIS — R1310 Dysphagia, unspecified: Secondary | ICD-10-CM

## 2017-12-23 DIAGNOSIS — R0789 Other chest pain: Secondary | ICD-10-CM

## 2017-12-23 NOTE — Progress Notes (Signed)
No egg or soy allergy known to patient  No issues with past sedation with any surgeries  or procedures, no intubation problems  No diet pills per patient No home 02 use per patient  No blood thinners per patient  No A fib or A flutter  EMMI video sent to pt's e mail  Patient stating she is taking Megace to stop monthly periods. Patient is anticipating a hysterectomy soon .

## 2017-12-27 ENCOUNTER — Encounter: Payer: Self-pay | Admitting: Gastroenterology

## 2017-12-27 ENCOUNTER — Ambulatory Visit (AMBULATORY_SURGERY_CENTER): Payer: BLUE CROSS/BLUE SHIELD | Admitting: Gastroenterology

## 2017-12-27 ENCOUNTER — Encounter: Payer: Self-pay | Admitting: Family Medicine

## 2017-12-27 VITALS — BP 165/94 | HR 78 | Temp 98.9°F | Resp 13

## 2017-12-27 DIAGNOSIS — R0789 Other chest pain: Secondary | ICD-10-CM

## 2017-12-27 DIAGNOSIS — K229 Disease of esophagus, unspecified: Secondary | ICD-10-CM | POA: Diagnosis not present

## 2017-12-27 DIAGNOSIS — K3189 Other diseases of stomach and duodenum: Secondary | ICD-10-CM | POA: Diagnosis not present

## 2017-12-27 DIAGNOSIS — R1310 Dysphagia, unspecified: Secondary | ICD-10-CM | POA: Diagnosis not present

## 2017-12-27 MED ORDER — SODIUM CHLORIDE 0.9 % IV SOLN: 500.0000 mL | Freq: Once | INTRAVENOUS | Status: DC

## 2017-12-27 NOTE — Patient Instructions (Signed)
YOU HAD AN ENDOSCOPIC PROCEDURE TODAY AT THE [facility]:   Refer to the procedure report that was given to you for any specific questions about what was found during the examination.  If the procedure report does not answer your questions, please call your gastroenterologist to clarify.  If you requested that your care partner not be given the details of your procedure findings, then the procedure report has been included in a sealed envelope for you to review at your convenience later.  YOU SHOULD EXPECT: Some feelings of bloating in the abdomen. Passage of more gas than usual.  Walking can help get rid of the air that was put into your GI tract during the procedure and reduce the bloating. If you had a lower endoscopy (such as a colonoscopy or flexible sigmoidoscopy) you may notice spotting of blood in your stool or on the toilet paper. If you underwent a bowel prep for your procedure, you may not have a normal bowel movement for a few days.  Please Note:  You might notice some irritation and congestion in your nose or some drainage.  This is from the oxygen used during your procedure.  There is no need for concern and it should clear up in a day or so.  SYMPTOMS TO REPORT IMMEDIATELY:   Following upper endoscopy (EGD)  Vomiting of blood or coffee ground material  New chest pain or pain under the shoulder blades  Painful or persistently difficult swallowing  New shortness of breath  Fever of 100F or higher  Black, tarry-looking stools  For urgent or emergent issues, a gastroenterologist can be reached at any hour by calling [phone].   DIET:  We do recommend a small meal at first, but then you may proceed to your regular diet.  Drink plenty of fluids but you should avoid alcoholic beverages for 24 hours.  ACTIVITY:  You should plan to take it easy for the rest of today and you should NOT DRIVE or use heavy machinery until tomorrow (because of the sedation medicines used  during the test).    FOLLOW UP: Our staff will call the number listed on your records the next business day following your procedure to check on you and address any questions or concerns that you may have regarding the information given to you following your procedure. If we do not reach you, we will leave a message.  However, if you are feeling well and you are not experiencing any problems, there is no need to return our call.  We will assume that you have returned to your regular daily activities without incident.  If any biopsies were taken you will be contacted by phone or by letter within the next 1-3 weeks.  Please call us at [phone] if you have not heard about the biopsies in 3 weeks.    SIGNATURES/CONFIDENTIALITY: You and/or your care partner have signed paperwork which will be entered into your electronic medical record.  These signatures attest to the fact that that the information above on your After Visit Summary has been reviewed and is understood.  Full responsibility of the confidentiality of this discharge information lies with you and/or your care-partner. 

## 2017-12-27 NOTE — Progress Notes (Signed)
Called to room to assist during endoscopic procedure.  Patient ID and intended procedure confirmed with present staff. Received instructions for my participation in the procedure from the performing physician.  

## 2017-12-27 NOTE — Op Note (Signed)
[place] Patient Name: Christina [name] Procedure Date: 12/27/2017 4:29 PM MRN: [id number] Endoscopist: Remo Lipps P. Havery Moros , MD Age: 52 Referring MD:  Date of Birth: [birth year] Gender: Female Account #: [id number] Procedure:                Upper GI endoscopy Indications:              atypical chest pain (non cardiac), negative cardiac                            workup, no improvement with PPI Medicines:                Monitored Anesthesia Care Procedure:                Pre-Anesthesia Assessment:                           - Prior to the procedure, a History and Physical                            was performed, and patient medications and                            allergies were reviewed. The patient's tolerance of                            previous anesthesia was also reviewed. The risks                            and benefits of the procedure and the sedation                            options and risks were discussed with the patient.                            All questions were answered, and informed consent                            was obtained. Prior Anticoagulants: The patient has                            taken no previous anticoagulant or antiplatelet                            agents. ASA Grade Assessment: III - A patient with                            severe systemic disease. After reviewing the risks                            and benefits, the patient was deemed in                            satisfactory condition to undergo the procedure.  After obtaining informed consent, the endoscope was                            passed under direct vision. Throughout the                            procedure, the patient's blood pressure, pulse, and                            oxygen saturations were monitored continuously. The                            Endoscope was introduced through the mouth, and                            advanced to  the second part of duodenum. The upper                            GI endoscopy was accomplished without difficulty.                            The patient tolerated the procedure well. Scope In: Scope Out: Findings:                 Esophagogastric landmarks were identified: the                            Z-line was found at 39 cm, the gastroesophageal                            junction was found at 39 cm and the upper extent of                            the gastric folds was found at 40 cm from the                            incisors.                           A 1 cm hiatal hernia was present.                           The exam of the esophagus was otherwise normal. No                            inflammatory changes, no ulcerations.                           Biopsies were taken with a cold forceps in the                            upper third of the esophagus, in the middle third  of the esophagus and in the lower third of the                            esophagus for histology and to rule out                            eosinophilic esophagitis.                           Patchy mildly erythematous mucosa was found in the                            gastric antrum. Biopsies were taken with a cold                            forceps for Helicobacter pylori testing.                           The exam of the stomach was otherwise normal.                           Patchy mildly erythematous mucosa with some                            erosions was found in the duodenal bulb.                           The exam of the duodenum was otherwise normal. Complications:            No immediate complications. Estimated blood loss:                            Minimal. Estimated Blood Loss:     Estimated blood loss was minimal. Impression:               - Esophagogastric landmarks identified.                           - 1 cm hiatal hernia.                           - Normal  esophagus otherwise - no overt cause for                            symptoms. Biopsies taken to rule out eosinophilic                            esophagitis.                           - Erythematous mucosa in the antrum. Biopsied to                            rule out H pylori.                           -  Erythematous duodenopathy.                           - Normal duodenum otherwise.                           No overt cause for symptoms on this exam. Will                            await pathology results. Esophageal spasm /                            motility disorder remains possible. Recommendation:           - Patient has a contact number available for                            emergencies. The signs and symptoms of potential                            delayed complications were discussed with the                            patient. Return to normal activities tomorrow.                            Written discharge instructions were provided to the                            patient.                           - Resume previous diet.                           - Continue present medications.                           - Await pathology results.                           - If pathology is negative consideration for                            esophageal manometry and 24 Hour pH impedance                            testing Jamorris Ndiaye P. Robben Jagiello, MD 12/27/2017 4:48:15 PM This report has been signed electronically.

## 2017-12-27 NOTE — Progress Notes (Signed)
Report given to PACU, vss 

## 2017-12-27 NOTE — Progress Notes (Signed)
Pt's states no medical or surgical changes since previsit or office visit. 

## 2017-12-28 ENCOUNTER — Telehealth: Payer: Self-pay

## 2017-12-28 ENCOUNTER — Telehealth: Payer: Self-pay | Admitting: *Deleted

## 2017-12-28 NOTE — Telephone Encounter (Signed)
Patient currently taking losartan-hctz 100-12.5 one tablet daily

## 2017-12-28 NOTE — Telephone Encounter (Signed)
No answer for post procedure call back. No answering machine available will call back later this afternoon. Sm

## 2017-12-28 NOTE — Telephone Encounter (Signed)
  Follow up Call-  Call back number 12/27/2017  Post procedure Call Back phone  # [phone]  Permission to leave phone message Yes  Some recent data might be hidden     Patient questions:  Do you have a fever, pain , or abdominal swelling? No. Pain Score  0 *  Have you tolerated food without any problems? Yes.    Have you been able to return to your normal activities? Yes.    Do you have any questions about your discharge instructions: Diet   No. Medications  No. Follow up visit  No.  Do you have questions or concerns about your Care? No.  Actions: * If pain score is 4 or above: No action needed, pain <4.

## 2018-01-03 ENCOUNTER — Telehealth: Payer: Self-pay | Admitting: Gastroenterology

## 2018-01-03 NOTE — Telephone Encounter (Signed)
Left message that results are not completed yet, but when they are and Dr. Havery Moros has had a chance to review them will certainly let her know.

## 2018-01-04 ENCOUNTER — Other Ambulatory Visit: Payer: Self-pay

## 2018-01-04 DIAGNOSIS — R0789 Other chest pain: Secondary | ICD-10-CM

## 2018-01-09 ENCOUNTER — Encounter: Payer: Self-pay | Admitting: Family Medicine

## 2018-01-09 ENCOUNTER — Telehealth: Payer: Self-pay

## 2018-01-09 NOTE — Telephone Encounter (Signed)
Pt had sent me message late last month, but for some reason I don't think I saw her reply and did not respond to her last message  Her BP was quite high when she went in for an EGD- otherwise however it has been reasonable  She will check her BP tomorrow at her job and will send me some readings, I will adjust her meds if need be She will follow-up with her cardiologist as well - she is still having this chest pain that we cannot put a dx to.  She feels worried about her heart  BP Readings from Last 3 Encounters:  12/27/17 (!) 165/94  10/20/17 (!) 142/84  08/02/17 122/78

## 2018-01-09 NOTE — Telephone Encounter (Signed)
Copied from [employer] [id number]. Topic: General - Other >> Jan 06, 2018  4:58 PM Mcneil, Ja-Kwan wrote: Reason for CRM: Pt states she sent Dr. Lorelei Pont a message through [facility] but she has yet to hear back. Pt would like to know if her blood pressure medication will be adjusted. Pt requests call back.

## 2018-01-09 NOTE — Telephone Encounter (Signed)
Last note from 12/27/17. Please advise on BP medication adjustment.

## 2018-01-23 ENCOUNTER — Telehealth: Payer: Self-pay | Admitting: Gastroenterology

## 2018-01-23 NOTE — Telephone Encounter (Signed)
I have cancelled the esophageal mano and PH study. I did not call the patient back.

## 2018-01-24 NOTE — Telephone Encounter (Signed)
Christina [name] can you touch base with the patient and see if she still wishes to have a workup for her chest pain symptoms if they persist? If she would rather be seen in the clinic first to discuss options, that's fine. Thanks

## 2018-01-25 NOTE — Telephone Encounter (Signed)
Sent patient a follow up message through [facility].

## 2018-02-01 ENCOUNTER — Ambulatory Visit (HOSPITAL_COMMUNITY)
Admission: RE | Admit: 2018-02-01 | Payer: BLUE CROSS/BLUE SHIELD | Source: Ambulatory Visit | Admitting: Gastroenterology

## 2018-02-01 ENCOUNTER — Encounter (HOSPITAL_COMMUNITY): Admission: RE | Payer: Self-pay | Source: Ambulatory Visit

## 2018-02-01 SURGERY — MANOMETRY, ESOPHAGUS
Anesthesia: Topical

## 2018-03-01 ENCOUNTER — Encounter: Payer: Self-pay | Admitting: Family Medicine

## 2018-03-01 DIAGNOSIS — I10 Essential (primary) hypertension: Secondary | ICD-10-CM

## 2018-03-01 MED ORDER — LOSARTAN POTASSIUM-HCTZ 100-25 MG PO TABS: 1.0000 | ORAL_TABLET | Freq: Every day | ORAL | 3 refills | Status: DC

## 2018-03-03 ENCOUNTER — Encounter: Payer: Self-pay | Admitting: Family Medicine

## 2018-03-03 DIAGNOSIS — I10 Essential (primary) hypertension: Secondary | ICD-10-CM

## 2018-03-06 MED ORDER — LOSARTAN POTASSIUM-HCTZ 100-25 MG PO TABS: 1.0000 | ORAL_TABLET | Freq: Every day | ORAL | 3 refills | Status: DC

## 2018-03-08 ENCOUNTER — Ambulatory Visit (INDEPENDENT_AMBULATORY_CARE_PROVIDER_SITE_OTHER): Payer: BLUE CROSS/BLUE SHIELD | Admitting: Cardiovascular Disease

## 2018-03-08 ENCOUNTER — Encounter: Payer: Self-pay | Admitting: Cardiovascular Disease

## 2018-03-08 ENCOUNTER — Encounter

## 2018-03-08 VITALS — BP 166/94 | HR 72 | Ht 67.0 in | Wt 285.0 lb

## 2018-03-08 DIAGNOSIS — I10 Essential (primary) hypertension: Secondary | ICD-10-CM | POA: Diagnosis not present

## 2018-03-08 DIAGNOSIS — R079 Chest pain, unspecified: Secondary | ICD-10-CM

## 2018-03-08 NOTE — Patient Instructions (Signed)
Medication Instructions:  Your physician recommends that you continue on your current medications as directed. Please refer to the Current Medication list given to you today.  If you need a refill on your cardiac medications before your next appointment, please call your pharmacy.   Lab work: None Ordered   Testing/Procedures: None Ordered   Follow-Up: At [facility], you and your health needs are our priority.  As part of our continuing mission to provide you with exceptional heart care, we have created designated Provider Care Teams.  These Care Teams include your primary Cardiologist (physician) and Advanced Practice Providers (APPs -  Physician Assistants and Nurse Practitioners) who all work together to provide you with the care you need, when you need it. You will need a follow up appointment in:  As needed.  Please call our office 2 months in advance to schedule this appointment.  You may see Philip Nahser, MD or one of the following Advanced Practice Providers on your designated Care Team: Scott Weaver, PA-C Vin Bhagat, PA-C . Janine Hammond, NP    

## 2018-03-08 NOTE — Progress Notes (Signed)
Cardiology Office Note   Date:  03/08/2018   ID:  Christina [name], DOB 1965-06-03, MRN [id number]  PCP:  Darreld Mclean, MD  Cardiologist:   Mertie Moores, MD   Chief Complaint  Patient presents with  . Chest Pain   Problem List 1. Chest pain  2. HTN     Christina [name] is a 52 y.o. female who is being seen today for the evaluation of chest pain  at the request of Copland, Gay Filler, MD.  Has been having CP for the past year.  Tightness.  Lasts for a minute or so.  Not related to exertion, eating , drinking, change of position. Not pleuretic.    Resolves spontaneously after several minutes  Hx of HTN - was on Lisinopril but this caused a cough.   Makes no effort to avoid salt .  Discussed salty foods at length  Does not exercise  Works at [employer]  These episodes have been going in for several years     Has palpitations  - clinically sounds like PVCs  Oct. 9, 2018:  Christina [name] is seen back today for followup of recurrent CP She was seen about a year ago for CP .  Was thought to be atypical . She was seen at her primary MD and has continued to have CP.    Dr. Edilia Bo sent me a message requesting that we see Miche again for continued CP .  Still having cp .  The last episode started while she was at a meeting at work .  They last several minutes,  Starts mid sternal , dull pain . Gets stronger. Radiates up to her jaw,  Stops at her gums. Radiates through to her back  Slowly supsides and goes away .  May last for 10 min. Is not exercising No diaphoresis, no presyncope Does not feel like refulx  .     March 08, 2018: Christina [name] is seen today for follow-up of her chest pain.  Her chest pain has been rather atypical.  She had an upper endoscopy in August, 2019 which was basically unremarkable.  Coronary CT angiogram in Nov. 2018 was normal .     Past Medical History:  Diagnosis Date  . Chest pain   . Essential hypertension 07/08/2016  . GERD  (gastroesophageal reflux disease)   . Uterine fibroid   . Vitamin D deficiency     Past Surgical History:  Procedure Laterality Date  . lump removed from breast at age 1    . ovarian cyst removed at 52yr old       Current Outpatient Medications  Medication Sig Dispense Refill  . carvedilol (COREG) 6.25 MG tablet Take 1 tablet (6.25 mg total) by mouth 2 (two) times daily. 180 tablet 3  . losartan-hydrochlorothiazide (HYZAAR) 100-25 MG tablet Take 1 tablet by mouth daily. 90 tablet 3  . megestrol (MEGACE) 40 MG tablet Take 1 tablet (40 mg total) by mouth 2 (two) times daily. 40 mg daily. May take additional 40 mg in the event of heavy bleeding. 60 tablet 5   No current facility-administered medications for this visit.     PAD Screen 09/06/2016  Previous PAD dx? No  Previous surgical procedure? No  Pain with walking? Yes  Subsides with rest? Yes  Feet/toe relief with dangling? Yes  Painful, non-healing ulcers? No  Extremities discolored? No      Allergies:   Lisinopril    Social History:  The patient  reports that  she has never smoked. She has never used smokeless tobacco. She reports that she does not drink alcohol or use drugs.   Family History:  The patient's family history includes Dementia in her father; Hypertension in her mother.    ROS:  Please see the history of present illness.   Physical Exam: Blood pressure (!) 166/94, pulse 72, height 5\' 7"  (1.702 m), weight 285 lb (129.3 kg).  GEN:  Morbidly obese , middle age female  HEENT: Normal NECK: No JVD; No carotid bruits LYMPHATICS: No lymphadenopathy CARDIAC: RRR  RESPIRATORY:  Clear to auscultation without rales, wheezing or rhonchi  ABDOMEN: Soft, non-tender, non-distended MUSCULOSKELETAL:  No edema; No deformity  SKIN: Warm and dry NEUROLOGIC:  Alert and oriented x 3   EKG:   March 08, 2018: Normal sinus rhythm at 72 beats minute.  Nonspecific T wave abnormalities   Recent Labs: 04/22/2017: HGB  13.4; Platelets 252 05/02/2017: TSH 0.61 07/11/2017: BUN 14; Creatinine, Ser 0.73; Potassium 3.6; Sodium 138    Lipid Panel    Component Value Date/Time   CHOL 174 05/02/2017 1021   TRIG 95 05/02/2017 1021   HDL 56 05/02/2017 1021   CHOLHDL 3.1 05/02/2017 1021   VLDL 18.6 06/23/2016 1516   LDLCALC 99 05/02/2017 1021      Wt Readings from Last 3 Encounters:  03/08/18 285 lb (129.3 kg)  12/23/17 275 lb 9.6 oz (125 kg)  10/20/17 275 lb 6 oz (124.9 kg)      Other studies Reviewed: Additional studies/ records that were reviewed today include: . Review of the above records demonstrates:    ASSESSMENT AND PLAN:  1.  Chest pain:   Had a normal Coronary CT angio showed no CAD. Coronary calcium score is 0   she eats a very poor diet.  She drinks 3 quarts of sugary soda every day.  She also drinks sweet tea and sugary Kool-Aid like drinks.  She eats a candy bar every night before going to bed.  She eats out for lunch on a daily basis.  Long discussion about improving her diet.  She will call her the nutritionist that [facility] has arranged for their employees to see.  Urged her to work very hard on this diet.  She will likely have some symptoms of sugar withdrawal.  Eventually I to see her exercising.  We will see her on an as-needed basis.   2. Essential hypertension:    Continue meds.  Encouraged weight loss.     3.  Palpitations:      4. Obesity:    This is her main problem.  I long discussion with her.  She will be consulting a nutritionist and will also be seeking other counseling through her workplace.  Current medicines are reviewed at length with the patient today.  The patient does not have concerns regarding medicines.  Labs/ tests ordered today include:   Orders Placed This Encounter  Procedures  . EKG 12-Lead            Mertie Moores, MD  03/08/2018 5:44 PM    [facility] [address], [place], [place]   [address] Phone: [phone]; Fax: [phone]

## 2018-03-13 DIAGNOSIS — M7662 Achilles tendinitis, left leg: Secondary | ICD-10-CM | POA: Diagnosis not present

## 2018-03-13 DIAGNOSIS — M71572 Other bursitis, not elsewhere classified, left ankle and foot: Secondary | ICD-10-CM | POA: Diagnosis not present

## 2018-03-13 DIAGNOSIS — M7732 Calcaneal spur, left foot: Secondary | ICD-10-CM | POA: Diagnosis not present

## 2018-03-26 ENCOUNTER — Encounter: Payer: Self-pay | Admitting: Family Medicine

## 2018-03-26 DIAGNOSIS — R0789 Other chest pain: Secondary | ICD-10-CM

## 2018-03-27 ENCOUNTER — Encounter: Payer: Self-pay | Admitting: Family Medicine

## 2018-03-27 ENCOUNTER — Telehealth: Payer: Self-pay | Admitting: Nurse Practitioner

## 2018-03-27 ENCOUNTER — Ambulatory Visit: Payer: Self-pay

## 2018-03-27 DIAGNOSIS — R079 Chest pain, unspecified: Secondary | ICD-10-CM

## 2018-03-27 NOTE — Telephone Encounter (Signed)
Spoke with patient who states she had 3 episodes of severe pain yesterday that led her to go to Urgent Care. States pain starts under right breast and comes up into chest and then into her gums. Reports 3 episodes yesterday with diaphoresis and intense pain x 15-20 minutes. Denies stress at time of event, was watching television; all events occurred before 0700. States she has had these episodes for approximately 3 years. Had not eaten anything yet yesterday; denies n/v. Denies pain today and denies tenderness or pain with movement. She asks if pain could be r/t heart and or gallbladder and I reviewed previous tests and answered her questions about symptoms of and appropriate tests for both. I answered her questions about her coronary CT from last year that showed calcium score of 0.  I advised that per Dr. Acie Fredrickson, we can order a lexiscan myoview for further evaluation. I advised one of our schedulers will call her to schedule. I advised her to contact Dr. Lorelei Pont, PCP regarding testing of gallbladder and or lungs. She verbalized understanding and agreement with plan and thanked me for my help.

## 2018-03-27 NOTE — Telephone Encounter (Signed)
Pt c/o intermittent chest pain that pt stated radiates to neck, jaw, arms and back. Pt stated that the pain episode happened Sunday and lasted 25 minutes. Pt denied chest pain during call. Pt stated the chest pain was severe Sunday. Pt stated that she was going to go to the ED but changed her mind. Pt stated she has been worked up by her PCP and cardiologist and they cannot find the cause of her chest pain. Pt was last seen in office 10/20/17 with same complaint. Per office note 10/20/17 pt has had chest pain for years.  Pt was frustrated during call. She is upset that no one can diagnose what is causing her pain. Pt  said "I\'m not making this up." No openings with Dr Copland today and pt refused to see another provider. Pt stated she is going out of town tomorrow and will not be back in town until next Monday. Appt made for 04/03/18 at 2:15. Care advice given and pt verbalized understanding.  Reason for Disposition . [1] Intermittent chest pain from "angina" AND [2] NO increase in severity or frequency  Answer Assessment - Initial Assessment Questions 1. LOCATION: "Where does it hurt?"       No pain now 2. RADIATION: "Does the pain go anywhere else?" (e.g., into neck, jaw, arms, back)     Goes to neck, jaw, arms and back 3. ONSET: "When did the chest pain begin?" (Minutes, hours or days)      Sunday 4. PATTERN "Does the pain come and go, or has it been constant since it started?"  "Does it get worse with exertion?"      Goes and comes 5. DURATION: "How long does it last" (e.g., seconds, minutes, hours)     25  minutes 6. SEVERITY: "How bad is the pain?"  (e.g., Scale 1-10; mild, moderate, or severe)    - MILD (1-3): doesn't interfere with normal activities     - MODERATE (4-7): interferes with normal activities or awakens from sleep    - SEVERE (8-10): excruciating pain, unable to do any normal activities       severe 7. CARDIAC RISK FACTORS: "Do you have any history of heart problems or risk  factors for heart disease?" (e.g., prior heart attack, angina; high blood pressure, diabetes, being overweight, high cholesterol, smoking, or strong family history of heart disease)     H/o chest pain for the last year or so, cardiologist cant tell is what is wrong 8. PULMONARY RISK FACTORS: "Do you have any history of lung disease?"  (e.g., blood clots in lung, asthma, emphysema, birth control pills)     no 9. CAUSE: "What do you think is causing the chest pain?"     Angina and coronary artery disease 10. OTHER SYMPTOMS: "Do you have any other symptoms?" (e.g., dizziness, nausea, vomiting, sweating, fever, difficulty breathing, cough)       When the attack comes on starts to sweat 11. PREGNANCY: "Is there any chance you are pregnant?" "When was your last menstrual period?"       No-no menstrual period  Protocols used: CHEST PAIN-A-AH

## 2018-03-27 NOTE — Telephone Encounter (Signed)
Left message for patient to call back regarding MyChart message about chest pain

## 2018-03-27 NOTE — Telephone Encounter (Signed)
Patient has been scheduled for 04/03/18 for chest pain by the [facility]. I do not think the patient should wait that long. Your schedule is full until then ok to override?

## 2018-03-27 NOTE — Telephone Encounter (Signed)
Thanks Mel Almond.  Cardiology has spoken with her and have ordered a Philip. I also replied to her 2 mychart messages. As she is seeing [place] for concern of muscle cramps and gallbladder I think the 2nd is ok- the chest pain is being addressed by cardiology

## 2018-03-28 NOTE — Telephone Encounter (Signed)
Noted  

## 2018-03-29 ENCOUNTER — Telehealth: Payer: Self-pay

## 2018-03-29 ENCOUNTER — Encounter: Payer: Self-pay | Admitting: Family Medicine

## 2018-03-29 NOTE — Telephone Encounter (Signed)
FYI patient seen at urgent care.

## 2018-03-29 NOTE — Telephone Encounter (Signed)
Copied from [place] [id number]. Topic: General - Other >> Mar 28, 2018 12:02 PM Bea Graff, NT wrote: Reason for CRM: Pt called to schedule an appt for today to have her blood pressure checked. Offered pt a 4pm with Dr. Carollee Herter. Pt refused that time, and I had offered to call to get pt worked in with someone earlier. Pt stated she will go to somewhere else to get it checked. Advised pt to also call her cardiologist if she feels it is high. Pt states she spoke with them yesterday for an hour after speaking with out NT. Pt stated she will go get her blood pressure checked at maybe at [facility] or somewhere. Did advise pt is she is not feeling well and is concerned to follow-up at the ER or UC. Pt verbalized understanding.

## 2018-04-03 ENCOUNTER — Ambulatory Visit: Payer: BLUE CROSS/BLUE SHIELD | Admitting: Family Medicine

## 2018-04-03 ENCOUNTER — Encounter: Payer: Self-pay | Admitting: Family Medicine

## 2018-04-03 ENCOUNTER — Ambulatory Visit (INDEPENDENT_AMBULATORY_CARE_PROVIDER_SITE_OTHER): Payer: BLUE CROSS/BLUE SHIELD | Admitting: Family Medicine

## 2018-04-03 VITALS — BP 128/67 | HR 89 | Temp 99.4°F | Resp 16 | Ht 67.0 in | Wt 285.4 lb

## 2018-04-03 DIAGNOSIS — Z23 Encounter for immunization: Secondary | ICD-10-CM | POA: Diagnosis not present

## 2018-04-03 DIAGNOSIS — R252 Cramp and spasm: Secondary | ICD-10-CM | POA: Diagnosis not present

## 2018-04-03 NOTE — Telephone Encounter (Signed)
Dr copland was trying toget her to see her on Wed instead--- she has seen her  Several times for this and felt she could wait and see her she she know the pt better  Mel Almond spoke to her but I do not know the outcome

## 2018-04-03 NOTE — Telephone Encounter (Signed)
Pt. calling to see if she can be seen by Dr. Lorelei Pont today, but PCP is full. Dr. Lorelei Pont offered pt. Wednesday appointment, but pt. Stated she can only do today. Appointment with Dr. Etter Sjogren kept for 3PM today to address potassium levels and complaints of lower leg muscle cramps. Dr. Etter Sjogren made aware.

## 2018-04-03 NOTE — Progress Notes (Signed)
Patient ID: Christina [name], female    DOB: 1966/04/11  Age: 52 y.o. MRN: [id number]    Subjective:  Subjective  HPI Christina [name] presents for cramping in lower ext---  And numbness and tingling in her feet and hands    She also c/o pain in her calves with walking and it goes away with rest.     Review of Systems  Constitutional: Negative for appetite change, diaphoresis, fatigue and unexpected weight change.  Eyes: Negative for pain, redness and visual disturbance.  Respiratory: Negative for cough, chest tightness, shortness of breath and wheezing.   Cardiovascular: Negative for chest pain, palpitations and leg swelling.  Endocrine: Negative for cold intolerance, heat intolerance, polydipsia, polyphagia and polyuria.  Genitourinary: Negative for difficulty urinating, dysuria and frequency.  Musculoskeletal: Positive for myalgias.       Cramps in legs and hands Numbness and tingling in feet  Pt also c/o pain in calves when walking and with rest it is relieved   Neurological: Negative for dizziness, light-headedness, numbness and headaches.    History Past Medical History:  Diagnosis Date  . Chest pain   . Essential hypertension 07/08/2016  . GERD (gastroesophageal reflux disease)   . Uterine fibroid   . Vitamin D deficiency     She has a past surgical history that includes ovarian cyst removed at 52yr old and lump removed from breast at age 20.   Her family history includes Dementia in her father; Hypertension in her mother.She reports that she has never smoked. She has never used smokeless tobacco. She reports that she does not drink alcohol or use drugs.  Current Outpatient Medications on File Prior to Visit  Medication Sig Dispense Refill  . carvedilol (COREG) 6.25 MG tablet Take 1 tablet (6.25 mg total) by mouth 2 (two) times daily. 180 tablet 3  . losartan-hydrochlorothiazide (HYZAAR) 100-25 MG tablet Take 1 tablet by mouth daily. 90 tablet 3  . megestrol (MEGACE) 40 MG  tablet Take 1 tablet (40 mg total) by mouth 2 (two) times daily. 40 mg daily. May take additional 40 mg in the event of heavy bleeding. 60 tablet 5   No current facility-administered medications on file prior to visit.      Objective:  Objective  Physical Exam  Constitutional: She is oriented to person, place, and time. She appears well-developed and well-nourished.  HENT:  Head: Normocephalic and atraumatic.  Eyes: Conjunctivae and EOM are normal.  Neck: Normal range of motion. Neck supple. No JVD present. Carotid bruit is not present. No thyromegaly present.  Cardiovascular: Normal rate, regular rhythm and normal heart sounds.  No murmur heard. Pulmonary/Chest: Effort normal and breath sounds normal. No respiratory distress. She has no wheezes. She has no rales. She exhibits no tenderness.  Musculoskeletal: She exhibits no edema or tenderness.  Neurological: She is alert and oriented to person, place, and time.  Psychiatric: She has a normal mood and affect.  Nursing note and vitals reviewed.  BP 128/67   Pulse 89   Temp 99.4 F (37.4 C) (Oral)   Resp 16   Ht 5\' 7"  (1.702 m)   Wt 285 lb 6.4 oz (129.5 kg)   SpO2 98%   BMI 44.70 kg/m  Wt Readings from Last 3 Encounters:  04/03/18 285 lb 6.4 oz (129.5 kg)  03/08/18 285 lb (129.3 kg)  12/23/17 275 lb 9.6 oz (125 kg)     Lab Results  Component Value Date   WBC 6.7 04/22/2017   HGB 13.4  04/22/2017   HCT 41.3 04/22/2017   PLT 252 04/22/2017   GLUCOSE 91 07/11/2017   CHOL 174 05/02/2017   TRIG 95 05/02/2017   HDL 56 05/02/2017   LDLCALC 99 05/02/2017   ALT 8 01/31/2017   AST 11 01/31/2017   NA 138 07/11/2017   K 3.6 07/11/2017   CL 104 07/11/2017   CREATININE 0.73 07/11/2017   BUN 14 07/11/2017   CO2 25 07/11/2017   TSH 0.61 05/02/2017   INR 1.1 04/22/2017   HGBA1C 5.7 (H) 05/02/2017    Ct Chest Wo Contrast  Result Date: 11/12/2017 CLINICAL DATA:  52 year old female with history of anterior chest pain beneath  the right breast radiating up to the jaw causing pain. No associated shortness of breath. EXAM: CT CHEST WITHOUT CONTRAST TECHNIQUE: Multidetector CT imaging of the chest was performed following the standard protocol without IV contrast. COMPARISON:  Cardiac CT 03/04/2017. FINDINGS: Cardiovascular: Heart size is normal. There is no significant pericardial fluid, thickening or pericardial calcification. There is aortic atherosclerosis, as well as atherosclerosis of the great vessels of the mediastinum and the coronary arteries, including calcified atherosclerotic plaque in the left main, left anterior descending and left circumflex coronary arteries. Mediastinum/Nodes: No pathologically enlarged mediastinal or hilar lymph nodes. Please note that accurate exclusion of hilar adenopathy is limited on noncontrast CT scans. Esophagus is unremarkable in appearance. No axillary lymphadenopathy. Lungs/Pleura: A few scattered tiny pulmonary nodules are noted in the lungs bilaterally, largest of which is in the periphery of the right upper lobe (axial image 32 of series 3) measuring 5 x 3 mm (mean diameter of only 4 mm). No other larger more suspicious appearing pulmonary nodules or masses are noted. No acute consolidative airspace disease. No pleural effusions. Linear scarring in the right middle lobe. Upper Abdomen: 1 cm low-attenuation (6 HU) left adrenal nodule, compatible with an adenoma. Aortic atherosclerosis. Musculoskeletal: There are no aggressive appearing lytic or blastic lesions noted in the visualized portions of the skeleton. IMPRESSION: 1. No acute findings are noted in the chest to account for the patient's symptoms. 2. Tiny pulmonary nodules measuring 4 mm or less in size. These are nonspecific, but are statistically likely benign. No follow-up needed if patient is low-risk (and has no known or suspected primary neoplasm). Non-contrast chest CT can be considered in 12 months if patient is high-risk. This  recommendation follows the consensus statement: Guidelines for Management of Incidental Pulmonary Nodules Detected on CT Images: From the Fleischner Society 2017; Radiology 2017; 284:228-243. 3. Aortic atherosclerosis, in addition to left main and 2 vessel coronary artery disease. Please note that although the presence of coronary artery calcium documents the presence of coronary artery disease, the severity of this disease and any potential stenosis cannot be assessed on this non-gated CT examination. Assessment for potential risk factor modification, dietary therapy or pharmacologic therapy may be warranted, if clinically indicated. 4. 1 cm left adrenal adenoma. Aortic Atherosclerosis (ICD10-I70.0). Electronically Signed   By: Vinnie Langton M.D.   On: 11/12/2017 16:36     Assessment & Plan:  Plan  I am having Chantalle Henthorn maintain her carvedilol, megestrol, and losartan-hydrochlorothiazide.  No orders of the defined types were placed in this encounter.   Problem List Items Addressed This Visit    None    Visit Diagnoses    Leg cramps    -  Primary   Relevant Orders   CBC with Differential/Platelet   Comprehensive metabolic panel   IBC panel  Ferritin   TSH   VAS [place] ABI WITH/WO TBI   Influenza vaccine administered       Relevant Orders   Flu Vaccine QUAD 6+ mos PF IM (Fluarix Quad PF) (Completed)      Follow-up: Return if symptoms worsen or fail to improve.  F/u with pcp  Ann Held, DO

## 2018-04-03 NOTE — Patient Instructions (Signed)
Leg Cramps Leg cramps occur when a muscle or muscles tighten and you have no control over this tightening (involuntary muscle contraction). Muscle cramps can develop in any muscle, but the most common place is in the calf muscles of the leg. Those cramps can occur during exercise or when you are at rest. Leg cramps are painful, and they may last for a few seconds to a few minutes. Cramps may return several times before they finally stop. Usually, leg cramps are not caused by a serious medical problem. In many cases, the cause is not known. Some common causes include:  Overexertion.  Overuse from repetitive motions, or doing the same thing over and over.  Remaining in a certain position for a long period of time.  Improper preparation, form, or technique while performing a sport or an activity.  Dehydration.  Injury.  Side effects of some medicines.  Abnormally low levels of the salts and ions in your blood (electrolytes), especially potassium and calcium. These levels could be low if you are taking water pills (diuretics) or if you are pregnant.  Follow these instructions at home: Watch your condition for any changes. Taking the following actions may help to lessen any discomfort that you are feeling:  Stay well-hydrated. Drink enough fluid to keep your urine clear or pale yellow.  Try massaging, stretching, and relaxing the affected muscle. Do this for several minutes at a time.  For tight or tense muscles, use a warm towel, heating pad, or hot shower water directed to the affected area.  If you are sore or have pain after a cramp, applying ice to the affected area may relieve discomfort. ? Put ice in a plastic bag. ? Place a towel between your skin and the bag. ? Leave the ice on for 20 minutes, 2-3 times per day.  Avoid strenuous exercise for several days if you have been having frequent leg cramps.  Make sure that your diet includes the essential minerals for your muscles to  work normally.  Take medicines only as directed by your health care provider.  Contact a health care provider if:  Your leg cramps get more severe or more frequent, or they do not improve over time.  Your foot becomes cold, numb, or blue. This information is not intended to replace advice given to you by your health care provider. Make sure you discuss any questions you have with your health care provider. Document Released: 05/27/2004 Document Revised: 09/25/2015 Document Reviewed: 03/27/2014 Elsevier Interactive Patient Education  2018 Elsevier Inc.  

## 2018-04-04 ENCOUNTER — Encounter: Payer: Self-pay | Admitting: Family Medicine

## 2018-04-04 DIAGNOSIS — R7989 Other specified abnormal findings of blood chemistry: Secondary | ICD-10-CM

## 2018-04-04 LAB — CBC WITH DIFFERENTIAL/PLATELET
Basophils Absolute: 0.1 10*3/uL (ref 0.0–0.1)
Basophils Relative: 1.2 % (ref 0.0–3.0)
Eosinophils Absolute: 0.3 10*3/uL (ref 0.0–0.7)
Eosinophils Relative: 3.6 % (ref 0.0–5.0)
HCT: 41.5 % (ref 36.0–46.0)
Hemoglobin: 13.9 g/dL (ref 12.0–15.0)
Lymphocytes Relative: 23.4 % (ref 12.0–46.0)
Lymphs Abs: 1.7 10*3/uL (ref 0.7–4.0)
MCHC: 33.4 g/dL (ref 30.0–36.0)
MCV: 93.7 fl (ref 78.0–100.0)
Monocytes Absolute: 0.5 10*3/uL (ref 0.1–1.0)
Monocytes Relative: 7.5 % (ref 3.0–12.0)
Neutro Abs: 4.7 10*3/uL (ref 1.4–7.7)
Neutrophils Relative %: 64.3 % (ref 43.0–77.0)
Platelets: 216 10*3/uL (ref 150.0–400.0)
RBC: 4.43 Mil/uL (ref 3.87–5.11)
RDW: 13.6 % (ref 11.5–15.5)
WBC: 7.3 10*3/uL (ref 4.0–10.5)

## 2018-04-04 LAB — COMPREHENSIVE METABOLIC PANEL
ALT: 16 U/L (ref 0–35)
AST: 12 U/L (ref 0–37)
Albumin: 4 g/dL (ref 3.5–5.2)
Alkaline Phosphatase: 89 U/L (ref 39–117)
BUN: 17 mg/dL (ref 6–23)
CO2: 24 mEq/L (ref 19–32)
Calcium: 9.4 mg/dL (ref 8.4–10.5)
Chloride: 104 mEq/L (ref 96–112)
Creatinine, Ser: 0.77 mg/dL (ref 0.40–1.20)
GFR: 100.4 mL/min (ref >60.00)
Glucose, Bld: 90 mg/dL (ref 70–99)
Potassium: 3.6 mEq/L (ref 3.5–5.1)
Sodium: 137 mEq/L (ref 135–145)
Total Bilirubin: 0.4 mg/dL (ref 0.2–1.2)
Total Protein: 7.2 g/dL (ref 6.0–8.3)

## 2018-04-04 LAB — IBC PANEL
Iron: 64 ug/dL (ref 42–145)
Saturation Ratios: 17.4 % — ABNORMAL LOW (ref 20.0–50.0)
Transferrin: 262 mg/dL (ref 212.0–360.0)

## 2018-04-04 LAB — TSH: TSH: 0.28 u[IU]/mL — ABNORMAL LOW (ref 0.35–4.50)

## 2018-04-04 LAB — FERRITIN: Ferritin: 80.7 ng/mL (ref 10.0–291.0)

## 2018-04-05 ENCOUNTER — Encounter: Payer: Self-pay | Admitting: Family Medicine

## 2018-04-06 ENCOUNTER — Encounter: Payer: Self-pay | Admitting: Family Medicine

## 2018-04-06 NOTE — Telephone Encounter (Signed)
We had discussed her muscle spasms---cramps--- we discussed trying hyland for leg cramps, and taking calcium at night as it can be a natural muscle relaxer  She also c/o about numbness / tingling in her hands and feet--- we discussed neuro referral if this did not improve

## 2018-04-07 ENCOUNTER — Encounter: Payer: Self-pay | Admitting: Family Medicine

## 2018-04-07 NOTE — Telephone Encounter (Signed)
Potassium is low normal--- can add Kcl 10 meq  1 po qd  #30  2 refills Then it can be rechecked with your f/u with Dr Lorelei Pont

## 2018-04-08 ENCOUNTER — Encounter: Payer: Self-pay | Admitting: Family Medicine

## 2018-04-09 ENCOUNTER — Encounter: Payer: Self-pay | Admitting: Family Medicine

## 2018-04-10 ENCOUNTER — Other Ambulatory Visit: Payer: Self-pay | Admitting: *Deleted

## 2018-04-10 MED ORDER — POTASSIUM CHLORIDE CRYS ER 10 MEQ PO TBCR: 10.0000 meq | EXTENDED_RELEASE_TABLET | Freq: Every day | ORAL | 2 refills | Status: DC

## 2018-04-10 NOTE — Telephone Encounter (Signed)
I sent in potassium for her last week--- she needs f/u with dr copland within the next month

## 2018-04-10 NOTE — Telephone Encounter (Signed)
It could mean a few things --- most commonly goes with anemia---  Can can take MVI with iron daily and f/u pcp

## 2018-04-12 ENCOUNTER — Ambulatory Visit (HOSPITAL_BASED_OUTPATIENT_CLINIC_OR_DEPARTMENT_OTHER)
Admission: RE | Admit: 2018-04-12 | Discharge: 2018-04-12 | Disposition: A | Discharge status: Home / Self Care | Payer: BLUE CROSS/BLUE SHIELD | Source: Ambulatory Visit | Attending: Family Medicine | Admitting: Family Medicine

## 2018-04-12 ENCOUNTER — Encounter: Payer: Self-pay | Admitting: Family Medicine

## 2018-04-12 DIAGNOSIS — R252 Cramp and spasm: Secondary | ICD-10-CM | POA: Diagnosis not present

## 2018-04-12 NOTE — Progress Notes (Signed)
ABI Doppler Evaluation   Right: Resting right ankle-brachial index indicates moderate right lower extremity arterial disease. The right toe-brachial index is abnormal. PPG tracings appear dampened.   Left: Resting left ankle-brachial index is within normal range. No evidence of significant left lower extremity arterial disease. PPG tracings appear dampened.    12/11/119  Cardell Peach RDCS, RVT

## 2018-04-12 NOTE — Progress Notes (Signed)
  Echocardiogram 2D Echocardiogram has been performed.  Christina [name] 04/12/2018, 9:25 AM

## 2018-04-13 ENCOUNTER — Telehealth (HOSPITAL_COMMUNITY): Payer: Self-pay | Admitting: *Deleted

## 2018-04-13 ENCOUNTER — Encounter: Payer: Self-pay | Admitting: Family Medicine

## 2018-04-13 ENCOUNTER — Other Ambulatory Visit: Payer: Self-pay | Admitting: Nurse Practitioner

## 2018-04-13 ENCOUNTER — Telehealth: Payer: Self-pay | Admitting: Nurse Practitioner

## 2018-04-13 DIAGNOSIS — M79605 Pain in left leg: Secondary | ICD-10-CM

## 2018-04-13 DIAGNOSIS — R6889 Other general symptoms and signs: Secondary | ICD-10-CM

## 2018-04-13 DIAGNOSIS — M79604 Pain in right leg: Secondary | ICD-10-CM

## 2018-04-13 NOTE — Telephone Encounter (Signed)
Please send med in that is in a previous message Please make app for pt to see Dr Lorelei Pont

## 2018-04-13 NOTE — Telephone Encounter (Signed)
Called patient in response to multiple advice requests sent with questions about PAD. Patient states pain in legs began 3 weeks ago with R > L. States pain worsens with walking. I advised her that she has an appointment with Dr. Gwenlyn Found for 12/27 for PV consult and to contact Dr. Etter Sjogren, PCP regarding the medication she suggested that the patient start for the leg pain.  I reviewed patient's chart with Dr. Acie Fredrickson and due to her severe pain and the results of the ABI being inconsistent she should have venous u/s to r/o DVT. I reviewed this information with patient and advised that she will receive a call to schedule this test at our [facility] office. Dr. Acie Fredrickson also advises that her coronary CT with calcium score of 0, that she does not have to proceed with getting the stress test but it is her choice. She confirmed that she would like to keep the appointment for next week. I advised that plan of care will be determined based on test results. Patient verbalized understanding and agreement and was very grateful for my help.

## 2018-04-13 NOTE — Telephone Encounter (Signed)
Patient given detailed instructions per Myocardial Perfusion Study Information Sheet for the test on 04/19/18 at 1:15. Patient notified to arrive 15 minutes early and that it is imperative to arrive on time for appointment to keep from having the test rescheduled.  If you need to cancel or reschedule your appointment, please call the office within 24 hours of your appointment. . Patient verbalized understanding.Christina [name]

## 2018-04-14 ENCOUNTER — Ambulatory Visit (HOSPITAL_COMMUNITY)
Admission: RE | Admit: 2018-04-14 | Discharge: 2018-04-14 | Disposition: A | Discharge status: Home / Self Care | Payer: BLUE CROSS/BLUE SHIELD | Source: Ambulatory Visit | Attending: Cardiology | Admitting: Cardiology

## 2018-04-14 ENCOUNTER — Encounter (HOSPITAL_COMMUNITY): Payer: Self-pay

## 2018-04-14 ENCOUNTER — Other Ambulatory Visit: Payer: Self-pay | Admitting: Family Medicine

## 2018-04-14 ENCOUNTER — Other Ambulatory Visit: Payer: Self-pay | Admitting: Cardiovascular Disease

## 2018-04-14 DIAGNOSIS — M79604 Pain in right leg: Secondary | ICD-10-CM

## 2018-04-14 DIAGNOSIS — M79605 Pain in left leg: Secondary | ICD-10-CM

## 2018-04-14 DIAGNOSIS — I739 Peripheral vascular disease, unspecified: Secondary | ICD-10-CM

## 2018-04-14 DIAGNOSIS — R6889 Other general symptoms and signs: Secondary | ICD-10-CM

## 2018-04-14 MED ORDER — CILOSTAZOL 100 MG PO TABS: 100.0000 mg | ORAL_TABLET | Freq: Two times a day (BID) | ORAL | 0 refills | Status: DC

## 2018-04-14 NOTE — Telephone Encounter (Signed)
Dr. Etter Sjogren I am not seeing a medication to be sent in. Please clarify on medication, dosage and refill amount.

## 2018-04-14 NOTE — Progress Notes (Unsigned)
plet

## 2018-04-14 NOTE — Progress Notes (Unsigned)
Today's bilateral lower extremity venous duplex is negative for DVT. Rouleaux flow is noted in the right common femoral vein, saphenofemoral junction, greater saphenous vein and popliteal vein. Suggest follow up Lower Extremity Venous duplex to evaluate Rouleaux flow has not developed into SVT or DVT. Preliminary results given to Dr. Acie Fredrickson at 3:40.

## 2018-04-14 NOTE — Telephone Encounter (Signed)
I had put it in a previous message pletal was called in F/u Dr Lorelei Pont

## 2018-04-14 NOTE — Telephone Encounter (Signed)
I already did this

## 2018-04-18 ENCOUNTER — Other Ambulatory Visit: Payer: Self-pay

## 2018-04-18 ENCOUNTER — Ambulatory Visit (INDEPENDENT_AMBULATORY_CARE_PROVIDER_SITE_OTHER): Payer: BLUE CROSS/BLUE SHIELD | Admitting: Gastroenterology

## 2018-04-18 ENCOUNTER — Other Ambulatory Visit (INDEPENDENT_AMBULATORY_CARE_PROVIDER_SITE_OTHER): Payer: BLUE CROSS/BLUE SHIELD

## 2018-04-18 ENCOUNTER — Encounter: Payer: Self-pay | Admitting: Family Medicine

## 2018-04-18 ENCOUNTER — Other Ambulatory Visit: Payer: Self-pay | Admitting: Cardiovascular Disease

## 2018-04-18 ENCOUNTER — Encounter: Payer: Self-pay | Admitting: Gastroenterology

## 2018-04-18 VITALS — BP 140/82 | HR 85 | Ht 67.0 in | Wt 283.4 lb

## 2018-04-18 DIAGNOSIS — R7989 Other specified abnormal findings of blood chemistry: Secondary | ICD-10-CM | POA: Diagnosis not present

## 2018-04-18 DIAGNOSIS — R0789 Other chest pain: Secondary | ICD-10-CM | POA: Diagnosis not present

## 2018-04-18 LAB — TSH: TSH: 0.54 u[IU]/mL (ref 0.35–4.50)

## 2018-04-18 LAB — T3, FREE: T3, Free: 3.6 pg/mL (ref 2.3–4.2)

## 2018-04-18 NOTE — Patient Instructions (Addendum)
If you are age 54 or older, your body mass index should be between 23-30. Your Body mass index is 44.39 kg/m. If this is out of the aforementioned range listed, please consider follow up with your Primary Care Provider.  If you are age 76 or younger, your body mass index should be between 19-25. Your Body mass index is 44.39 kg/m. If this is out of the aformentioned range listed, please consider follow up with your Primary Care Provider.   You have been scheduled for an Esophageal Manometry and 24 Hour pH study at [facility] on Wed, Jan. 8th  at 12:30pm. Please arrive 30 minutes prior to your procedure for registration. You will need to go to outpatient registration (1st floor of the hospital) first. Make certain to bring your insurance cards as well as a complete list of medications.  Please remember the following:  1) Do not take any muscle relaxants, xanax (alprazolam) or ativan for 1 day prior to your test as well as the day of the test.  2) Nothing to eat or drink after 12:00 midnight on the night before your test.  3) Hold all diabetic medications/insulin the morning of the test. You may eat and take your medications after the test.  4) For 7 days prior to your test, do not take: Reglan, Tagamet, Zantac, Phenergan, Axid or Pepcid.  5) You MAY use an antacid such as Rolaids or Tums up to 12 hours prior to your test.    ------------------------------------------------------------------------------------------- ABOUT ESOPHAGEAL MANOMETRY Esophageal manometry (muh-NOM-uh-tree) is a test that gauges how well your esophagus works. Your esophagus is the long, muscular tube that connects your throat to your stomach. Esophageal manometry measures the rhythmic muscle contractions (peristalsis) that occur in your esophagus when you swallow. Esophageal manometry also measures the coordination and force exerted by the muscles of your esophagus.  During esophageal manometry, a  thin, flexible tube (catheter) that contains sensors is passed through your nose, down your esophagus and into your stomach. Esophageal manometry can be helpful in diagnosing some mostly uncommon disorders that affect your esophagus.  Why it's done Esophageal manometry is used to evaluate the movement (motility) of food through the esophagus and into the stomach. The test measures how well the circular bands of muscle (sphincters) at the top and bottom of your esophagus open and close, as well as the pressure, strength and pattern of the wave of esophageal muscle contractions that moves food along.  What you can expect Esophageal manometry is an outpatient procedure done without sedation. Most people tolerate it well. You may be asked to change into a hospital gown before the test starts.  During esophageal manometry  . While you are sitting up, a member of your health care team sprays your throat with a numbing medication or puts numbing gel in your nose or both.  . A catheter is guided through your nose into your esophagus. The catheter may be sheathed in a water-filled sleeve. It doesn't interfere with your breathing. However, your eyes may water, and you may gag. You may have a slight nosebleed from irritation.  . After the catheter is in place, you may be asked to lie on your back on an exam table, or you may be asked to remain seated.  . You then swallow small sips of water. As you do, a computer connected to the catheter records the pressure, strength and pattern of your esophageal muscle contractions.  . During the test, you'll be asked  to breathe slowly and smoothly, remain as still as possible, and swallow only when you're asked to do so.  . A member of your health care team may move the catheter down into your stomach while the catheter continues its measurements.  . The catheter then is slowly withdrawn.  This test typically takes 30-45 minutes to  complete.  ---------------------------------------------------------------------------------------------- ABOUT 24 HOUR PH PROBE An esophageal pH test measures and records the pH in your esophagus to determine if you have gastroesophageal reflux disease (GERD). The test can also be done to determine the effectiveness of medications or surgical treatment for GERD. What is esophageal reflux? Esophageal reflux is a condition in which stomach acid refluxes or moves back into the esophagus (the "food pipe" leading from the mouth to the stomach). How does the esophageal pH test work? A thin, small tube with an acid sensing device on the tip is gently passed through your nose, down the esophagus ("food tube"), and positioned about 2 inches above the lower esophageal sphincter. The tube is secured to the side of your face with clear tape. The end of the tube exiting from your nose is attached to a portable recorder that is worn on your belt or over your shoulder. The recorder has several buttons on it that you will press to mark certain events. A nurse will review the monitoring instructions with you. Once the test has begun, what do I need to know and do? Marland Kitchen Activity: Follow your usual daily routine. Do not reduce or change your activities during the monitoring period. Doing so can make the monitoring results less useful.  . Note: do not take a tub bath or shower; the equipment can't get wet.  . Eating: Eat your regular meals at the usual times. If you do not eat during the monitoring period, your stomach will not produce acid as usual, and the test results will not be accurate. Eat at least 2 meals a day. Eat foods that tend to increase your symptoms (without making yourself miserable). Avoid snacking. Do not suck on hard candy or lozenges and do not chew gum during the monitoring period.  . Lying down: Remain upright throughout the day. Do not lie down until you go to bed (unless napping or lying down during  the day is part of your daily routine).  . Medications: Continue to follow your doctor's advice regarding medications to avoid during the monitoring period.  . Recording symptoms: Press the appropriate button on your recorder when symptoms occur (as discussed with the nurse).  . Recording events: Record the time you start and stop eating and drinking (anything other than plain water). Record the time you lie down (even if just resting) and when you get back up. The nurse will explain this.  . Unusual symptoms or side effects. If you think you may be experiencing any unusual symptoms or side effects, call your doctor.  You will return the next day to have the tube removed. The information on the recorder will be downloaded to a computer and the results will be analyzed.  After completion of the study Resume your normal diet and medications. Lozenges or hard candy may help ease any sore throat caused by the tube.   It will take at least 2 weeks to receive the results of this test from your physician.   You can chew 2 peppermint Altoids as needed for esophageal spasms.  Thank you for entrusting me with your care and for choosing [employer],  Dr. Hamilton Cellar

## 2018-04-18 NOTE — Progress Notes (Signed)
HPI :  52 year old female here for a follow-up visit for atypical chest pain. She states this pain been ongoing since 2009 or so, around 10 years. She feels it in her right upper side or upper abdomen that radiates into the bottom of her chest, rises into the middle of her chest and into her jaw on both side. This can also radiate into her back. Initially when it started it would last for 1-2 minutes at a time, notes lasting upwards of 30 minutes at a time. She feels this occur sporadically, she feels it at least every month although frequency can vary. She does not have any known precipitants for these episodes, and often occurs when she is sitting at work. She denies any exertional symptoms. She denies any heartburn, she denies any symptoms related to eating. No dysphagia. She's had a few visits to the ED for severe pain since I have seen her. Pain can be extremely severe and debilitate her when she gets it.  She's had cardiology workup to include EKG, cardiac CT scan 03/2017, echocardiogram 03/2017. She's had an ultrasound of the right upper quadrant December 2018 which was normal. We performed an upper endoscopy for her in August 2019, she had a small 1 cm hiatal hernia, with patchy erythema of the stomach, otherwise normal. Biopsies of the esophagus and stomach were normal. She's had a trial of high-dose PPI as well as Carafate which have not provided any benefit. She's had a CT scan of her chest again in July of 2019, this did not show any clear source for her symptoms. She's been seen by cardiology and has a nuclear stress test scheduled for later this week.. she is frustrated by her symptoms as they continue to persist without clear etiology. She wonders what else can be done for her.  EGD 12/27/2017 - 1cm HH, normal esophagus, patchy erythema of the stomach, otherwise normal - biopsies of esophagus normal as were gastric biopsies  CT chest 11/12/2017 - tiny pulmonary nodules, follow up in 12  months. Aortic atherosclerosis, 1cm left adrenal adenoma  Cardiac CT - 03/07/2017 - dilated pulmonary artery, otherwise no significant coronary calcium Echocardiogram 03/17/2017 - 55-60%  [place] RUQ 04/20/2017 - normal  Cologuard - 05/08/2017 - negative   Past Medical History:  Diagnosis Date  . Chest pain   . Essential hypertension 07/08/2016  . GERD (gastroesophageal reflux disease)   . Uterine fibroid   . Vitamin D deficiency      Past Surgical History:  Procedure Laterality Date  . lump removed from breast at age 62    . ovarian cyst removed at 52yr old     Family History  Problem Relation Age of Onset  . Hypertension Mother   . Dementia Father   . Colon polyps Neg Hx   . Crohn's disease Neg Hx   . Rectal cancer Neg Hx   . Stomach cancer Neg Hx   . Pancreatic cancer Neg Hx    Social History   Tobacco Use  . Smoking status: Never Smoker  . Smokeless tobacco: Never Used  Substance Use Topics  . Alcohol use: No  . Drug use: No   Current Outpatient Medications  Medication Sig Dispense Refill  . carvedilol (COREG) 6.25 MG tablet Take 1 tablet (6.25 mg total) by mouth 2 (two) times daily. 180 tablet 3  . losartan-hydrochlorothiazide (HYZAAR) 100-25 MG tablet Take 1 tablet by mouth daily. 90 tablet 3  . megestrol (MEGACE) 40 MG tablet Take 1  tablet (40 mg total) by mouth 2 (two) times daily. 40 mg daily. May take additional 40 mg in the event of heavy bleeding. 60 tablet 5  . potassium chloride (K-DUR,KLOR-CON) 10 MEQ tablet Take 1 tablet (10 mEq total) by mouth daily. 30 tablet 2  . cilostazol (PLETAL) 100 MG tablet Take 1 tablet (100 mg total) by mouth 2 (two) times daily. (Patient not taking: Reported on 04/18/2018) 60 tablet 0   No current facility-administered medications for this visit.    Allergies  Allergen Reactions  . Lisinopril Cough     Review of Systems: All systems reviewed and negative except where noted in HPI.   Lab Results  Component Value Date     WBC 7.3 04/03/2018   HGB 13.9 04/03/2018   HCT 41.5 04/03/2018   MCV 93.7 04/03/2018   PLT 216.0 04/03/2018    Lab Results  Component Value Date   CREATININE 0.77 04/03/2018   BUN 17 04/03/2018   NA 137 04/03/2018   K 3.6 04/03/2018   CL 104 04/03/2018   CO2 24 04/03/2018    Lab Results  Component Value Date   ALT 16 04/03/2018   AST 12 04/03/2018   ALKPHOS 89 04/03/2018   BILITOT 0.4 04/03/2018     Physical Exam: BP 140/82   Pulse 85   Ht 5\' 7"  (1.702 m)   Wt 283 lb 6.4 oz (128.5 kg)   SpO2 98%   BMI 44.39 kg/m  Constitutional: Pleasant,well-developed, female in no acute distress. HEENT: Normocephalic and atraumatic. Conjunctivae are normal. No scleral icterus. Neck supple.  Cardiovascular: Normal rate, regular rhythm.  Pulmonary/chest: Effort normal and breath sounds normal. No wheezing, rales or rhonchi. Abdominal: Soft, protuberant, nontender. There are no masses palpable. No hepatomegaly. Extremities: no edema Lymphadenopathy: No cervical adenopathy noted. Neurological: Alert and oriented to person place and time. Skin: Skin is warm and dry. No rashes noted. Psychiatric: Normal mood and affect. Behavior is normal.   ASSESSMENT AND PLAN: 52 year old female here for reassessment of the following issues:  Atypical chest pain - extensive evaluation without clear etiology. Her cardiologist has scheduled her for a nuclear stress test this week from what she reports. We will await that evaluation. If that is abnormal that will need to be addressed. If it is a normal study and not thought to be related to her symptoms, I offered her esophageal manometry and 24-hour pH study off PPI to further evaluate for motility disorder/spasm and reflux at baseline. We discussed what this was and she wanted to proceed with it in this setting. In the interim as esophageal spasm could potentially cause these symptoms, recommend she try chewing 2 peppermint althoids at the onset of  symptoms and see if this helps anything (empirically treat for spasm). She agreed. If no pathology noted on her workup and symptoms persist, may consider empiric TCA. She agreed with the plan, further recs pending results and her course.   [employer] Cellar, MD [facility]

## 2018-04-19 ENCOUNTER — Ambulatory Visit (HOSPITAL_COMMUNITY): Payer: BLUE CROSS/BLUE SHIELD | Attending: Cardiovascular Disease

## 2018-04-19 DIAGNOSIS — R079 Chest pain, unspecified: Secondary | ICD-10-CM | POA: Diagnosis not present

## 2018-04-19 MED ORDER — REGADENOSON 0.4 MG/5ML IV SOLN
0.4000 mg | Freq: Once | INTRAVENOUS | Status: AC
  Administered 2018-04-19: 0.4 mg via INTRAVENOUS

## 2018-04-19 MED ORDER — TECHNETIUM TC 99M TETROFOSMIN IV KIT
27.3000 | PACK | Freq: Once | INTRAVENOUS | Status: AC | PRN
  Administered 2018-04-19: 27.3 via INTRAVENOUS
  Filled 2018-04-19: qty 28

## 2018-04-20 ENCOUNTER — Ambulatory Visit (HOSPITAL_COMMUNITY): Payer: BLUE CROSS/BLUE SHIELD | Attending: Cardiology

## 2018-04-20 DIAGNOSIS — I739 Peripheral vascular disease, unspecified: Secondary | ICD-10-CM

## 2018-04-20 LAB — MYOCARDIAL PERFUSION IMAGING
LV dias vol: 68 mL (ref 46–106)
LV sys vol: 27 mL
Peak HR: 127 {beats}/min
Rest HR: 86 {beats}/min
SDS: 6
SRS: 1
SSS: 7
TID: 0.96

## 2018-04-20 MED ORDER — TECHNETIUM TC 99M TETROFOSMIN IV KIT
31.5000 | PACK | Freq: Once | INTRAVENOUS | Status: AC | PRN
  Administered 2018-04-20: 31.5 via INTRAVENOUS
  Filled 2018-04-20: qty 32

## 2018-04-24 ENCOUNTER — Encounter: Payer: Self-pay | Admitting: Family Medicine

## 2018-04-27 ENCOUNTER — Other Ambulatory Visit: Payer: Self-pay | Admitting: Cardiovascular Disease

## 2018-04-28 ENCOUNTER — Ambulatory Visit (INDEPENDENT_AMBULATORY_CARE_PROVIDER_SITE_OTHER): Payer: BLUE CROSS/BLUE SHIELD | Admitting: Cardiovascular Disease

## 2018-04-28 ENCOUNTER — Encounter: Payer: Self-pay | Admitting: Cardiovascular Disease

## 2018-04-28 ENCOUNTER — Ambulatory Visit (HOSPITAL_COMMUNITY)
Admission: RE | Admit: 2018-04-28 | Discharge: 2018-04-28 | Disposition: A | Discharge status: Home / Self Care | Payer: BLUE CROSS/BLUE SHIELD | Source: Ambulatory Visit | Attending: Cardiovascular Disease | Admitting: Cardiovascular Disease

## 2018-04-28 ENCOUNTER — Other Ambulatory Visit: Payer: Self-pay

## 2018-04-28 VITALS — BP 146/80 | HR 86 | Ht 67.0 in | Wt 289.0 lb

## 2018-04-28 DIAGNOSIS — M79604 Pain in right leg: Secondary | ICD-10-CM | POA: Diagnosis not present

## 2018-04-28 DIAGNOSIS — M79605 Pain in left leg: Secondary | ICD-10-CM | POA: Diagnosis not present

## 2018-04-28 DIAGNOSIS — Z01812 Encounter for preprocedural laboratory examination: Secondary | ICD-10-CM | POA: Diagnosis not present

## 2018-04-28 DIAGNOSIS — I739 Peripheral vascular disease, unspecified: Secondary | ICD-10-CM | POA: Diagnosis not present

## 2018-04-28 DIAGNOSIS — R6889 Other general symptoms and signs: Secondary | ICD-10-CM | POA: Diagnosis not present

## 2018-04-28 NOTE — H&P (View-Only) (Signed)
04/28/2018 Christina Christina [name]   November 11, 1965  [id number]  Primary Physician Copland, Gay Filler, MD Primary Cardiologist: Christina Harp MD Christina Christina [name], [place]  HPI:  Christina Christina [name] is a 52 y.o. severely overweight single African-American female mother of 1 child, grandmother and 3 grandchildren who works Chief Operating Officer at [employer].  She was referred by Dr. Acie Christina [name] for peripheral vascular evaluation because of fairly recent onset right calf claudication which is lifestyle limiting.  She has a history of hypertension.  She has chronic chest pain with a recent negative Myoview and a negative coronary CTA 1 year ago.  She had fairly recent onset right calf pain approxi-1 month ago that occurred when she woke up 1 morning and has been fairly persistent with ambulation.  She had Christina [name] extremity arterial Doppler studies performed 04/28/2018 revealing a right ABI 0.61 with an occluded right popliteal artery.   Current Meds  Medication Sig  . carvedilol (COREG) 6.25 MG tablet TAKE 1 TABLET BY MOUTH TWICE DAILY  . cilostazol (PLETAL) 100 MG tablet Take 1 tablet (100 mg total) by mouth 2 (two) times daily.  Marland Kitchen losartan-hydrochlorothiazide (HYZAAR) 100-25 MG tablet Take 1 tablet by mouth daily.  . megestrol (MEGACE) 40 MG tablet Take 1 tablet (40 mg total) by mouth 2 (two) times daily. 40 mg daily. May take additional 40 mg in the event of heavy bleeding.  . potassium chloride (K-DUR,KLOR-CON) 10 MEQ tablet Take 1 tablet (10 mEq total) by mouth daily.     Allergies  Allergen Reactions  . Lisinopril Cough    Social History   Socioeconomic History  . Marital status: Single    Spouse name: Not on file  . Number of children: Not on file  . Years of education: Not on file  . Highest education level: Not on file  Occupational History  . Not on file  Social Needs  . Financial resource strain: Not on file  . Food insecurity:    Worry: Not on file    Inability: Not on file  .  Transportation needs:    Medical: Not on file    Non-medical: Not on file  Tobacco Use  . Smoking status: Never Smoker  . Smokeless tobacco: Never Used  Substance and Sexual Activity  . Alcohol use: No  . Drug use: No  . Sexual activity: Yes  Lifestyle  . Physical activity:    Days per week: Not on file    Minutes per session: Not on file  . Stress: Not on file  Relationships  . Social connections:    Talks on phone: Not on file    Gets together: Not on file    Attends religious service: Not on file    Active member of club or organization: Not on file    Attends meetings of clubs or organizations: Not on file    Relationship status: Not on file  . Intimate partner violence:    Fear of current or ex partner: Not on file    Emotionally abused: Not on file    Physically abused: Not on file    Forced sexual activity: Not on file  Other Topics Concern  . Not on file  Social History Narrative  . Not on file     Review of Systems: General: negative for chills, fever, night sweats or weight changes.  Cardiovascular: negative for chest pain, dyspnea on exertion, edema, orthopnea, palpitations, paroxysmal nocturnal dyspnea or shortness of breath Dermatological: negative for  rash Respiratory: negative for cough or wheezing Urologic: negative for hematuria Abdominal: negative for nausea, vomiting, diarrhea, bright red blood per rectum, melena, or hematemesis Neurologic: negative for visual changes, syncope, or dizziness All other systems reviewed and are otherwise negative except as noted above.    Blood pressure (!) 146/80, pulse 86, height 5\' 7"  (1.702 m), weight 289 lb (131.1 kg).  General appearance: alert and no distress Neck: no adenopathy, no carotid bruit, no JVD, supple, symmetrical, trachea midline and thyroid not enlarged, symmetric, no tenderness/mass/nodules Lungs: clear to auscultation bilaterally Heart: regular rate and rhythm, S1, S2 normal, no murmur, click,  rub or gallop Extremities: extremities normal, atraumatic, no cyanosis or edema Pulses: Absent right pedal pulse Skin: Skin color, texture, turgor normal. No rashes or lesions Neurologic: Alert and oriented X 3, normal strength and tone. Normal symmetric reflexes. Normal coordination and gait  EKG not performed today  ASSESSMENT AND PLAN:   Peripheral arterial disease (Christina Christina [name]) [name] was referred to me by Dr. Acie Christina [name] for evaluation and treatment of symptomatic PAD.  She had new onset right calf pain with ambulation approximately 1 month ago.  This was fairly rapid in onset when she woke up 1 morning.  Recent Christina [name] extremity arterial Doppler studies performed in our office 04/28/2018 revealed a right ABI 0.61 occluded right popliteal artery.  I am concerned this may have been a thromboembolic phenomenon.  She is on aspirin.  I am going arrange for her to go undergo angiography and potential endovascular therapy.      Christina Harp MD FACP,FACC,FAHA, [facility] 04/28/2018 3:45 PM

## 2018-04-28 NOTE — Patient Instructions (Addendum)
    [place] [address] Dept: [phone] Loc: [phone]  Christina [name]  04/28/2018  You are scheduled for a Peripheral Angiogram on Monday, May 08, 2018 with Dr. Quay Burow.  1. Please arrive at the [facility] (Main Entrance A) at [facility]: [address] [place], [place] [address] at 11:30 am (This time is two hours before your procedure to ensure your preparation). Free valet parking service is available.   Special note: Every effort is made to have your procedure done on time. Please understand that emergencies sometimes delay scheduled procedures.  2. Diet: Do not eat solid foods after midnight.  The patient may have clear liquids until 5am upon the day of the procedure.  3. Labs: You will need to have blood drawn. CBC, BMET, TSH  4. Medication instructions in preparation for your procedure:    *For reference purposes while preparing patient instructions.   Delete this med list prior to printing instructions for patient.*   On the morning of your procedure, take your morning medicines.  You may use sips of water.  5. Plan for one night stay--bring personal belongings. 6. Bring a current list of your medications and current insurance cards. 7. You MUST have a responsible person to drive you home. 8. Someone MUST be with you the first 24 hours after you arrive home or your discharge will be delayed. 9. Please wear clothes that are easy to get on and off and wear slip-on shoes.  Thank you for allowing [place] to care for you!   -- [facility]  Your physician has requested that you have an aorta/iliac duplex. During this test, an ultrasound is used to evaluate blood flow to the aorta and iliac arteries. Allow one hour for this exam. Do not eat after midnight the day before and avoid carbonated  beverages.  SCHEDULE 1-2 WEEKS AFTER PROCEDURE  FOLLOW UP:  SCHEDULE FOLLOW UP APPOINTMENT 2-3 WEEKS AFTER PROCEDURE

## 2018-04-28 NOTE — Assessment & Plan Note (Signed)
Christina [name] was referred to me by Dr. Acie Fredrickson for evaluation and treatment of symptomatic PAD.  She had new onset right calf pain with ambulation approximately 1 month ago.  This was fairly rapid in onset when she woke up 1 morning.  Recent lower extremity arterial Doppler studies performed in our office 04/28/2018 revealed a right ABI 0.61 occluded right popliteal artery.  I am concerned this may have been a thromboembolic phenomenon.  She is on aspirin.  I am going arrange for her to go undergo angiography and potential endovascular therapy.

## 2018-04-28 NOTE — Progress Notes (Signed)
04/28/2018 Christina [name]   August 16, 1965  [id number]  Primary Physician Copland, Christina Filler, MD Primary Cardiologist: Lorretta Harp MD Christina [name], [place]  HPI:  Christina [name] is a 52 y.o. severely overweight single African-American female mother of 1 child, grandmother and 3 grandchildren who works Chief Operating Officer at [employer].  She was referred by Dr. Acie [name] for peripheral vascular evaluation because of fairly recent onset right calf claudication which is lifestyle limiting.  She has a history of hypertension.  She has chronic chest pain with a recent negative Myoview and a negative coronary CTA 1 year ago.  She had fairly recent onset right calf pain approxi-1 month ago that occurred when she woke up 1 morning and has been fairly persistent with ambulation.  She had [name] extremity arterial Doppler studies performed 04/28/2018 revealing a right ABI 0.61 with an occluded right popliteal artery.   Current Meds  Medication Sig  . carvedilol (COREG) 6.25 MG tablet TAKE 1 TABLET BY MOUTH TWICE DAILY  . cilostazol (PLETAL) 100 MG tablet Take 1 tablet (100 mg total) by mouth 2 (two) times daily.  Marland Kitchen losartan-hydrochlorothiazide (HYZAAR) 100-25 MG tablet Take 1 tablet by mouth daily.  . megestrol (MEGACE) 40 MG tablet Take 1 tablet (40 mg total) by mouth 2 (two) times daily. 40 mg daily. May take additional 40 mg in the event of heavy bleeding.  . potassium chloride (K-DUR,KLOR-CON) 10 MEQ tablet Take 1 tablet (10 mEq total) by mouth daily.     Allergies  Allergen Reactions  . Lisinopril Cough    Social History   Socioeconomic History  . Marital status: Single    Spouse name: Not on file  . Number of children: Not on file  . Years of education: Not on file  . Highest education level: Not on file  Occupational History  . Not on file  Social Needs  . Financial resource strain: Not on file  . Food insecurity:    Worry: Not on file    Inability: Not on file  .  Transportation needs:    Medical: Not on file    Non-medical: Not on file  Tobacco Use  . Smoking status: Never Smoker  . Smokeless tobacco: Never Used  Substance and Sexual Activity  . Alcohol use: No  . Drug use: No  . Sexual activity: Yes  Lifestyle  . Physical activity:    Days per week: Not on file    Minutes per session: Not on file  . Stress: Not on file  Relationships  . Social connections:    Talks on phone: Not on file    Gets together: Not on file    Attends religious service: Not on file    Active member of club or organization: Not on file    Attends meetings of clubs or organizations: Not on file    Relationship status: Not on file  . Intimate partner violence:    Fear of current or ex partner: Not on file    Emotionally abused: Not on file    Physically abused: Not on file    Forced sexual activity: Not on file  Other Topics Concern  . Not on file  Social History Narrative  . Not on file     Review of Systems: General: negative for chills, fever, night sweats or weight changes.  Cardiovascular: negative for chest pain, dyspnea on exertion, edema, orthopnea, palpitations, paroxysmal nocturnal dyspnea or shortness of breath Dermatological: negative for  rash Respiratory: negative for cough or wheezing Urologic: negative for hematuria Abdominal: negative for nausea, vomiting, diarrhea, bright red blood per rectum, melena, or hematemesis Neurologic: negative for visual changes, syncope, or dizziness All other systems reviewed and are otherwise negative except as noted above.    Blood pressure (!) 146/80, pulse 86, height 5\' 7"  (1.702 m), weight 289 lb (131.1 kg).  General appearance: alert and no distress Neck: no adenopathy, no carotid bruit, no JVD, supple, symmetrical, trachea midline and thyroid not enlarged, symmetric, no tenderness/mass/nodules Lungs: clear to auscultation bilaterally Heart: regular rate and rhythm, S1, S2 normal, no murmur, click,  rub or gallop Extremities: extremities normal, atraumatic, no cyanosis or edema Pulses: Absent right pedal pulse Skin: Skin color, texture, turgor normal. No rashes or lesions Neurologic: Alert and oriented X 3, normal strength and tone. Normal symmetric reflexes. Normal coordination and gait  EKG not performed today  ASSESSMENT AND PLAN:   Peripheral arterial disease ([place]) Ms. Christina [name] was referred to me by Dr. Acie [name] for evaluation and treatment of symptomatic PAD.  She had new onset right calf pain with ambulation approximately 1 month ago.  This was fairly rapid in onset when she woke up 1 morning.  Recent [name] extremity arterial Doppler studies performed in our office 04/28/2018 revealed a right ABI 0.61 occluded right popliteal artery.  I am concerned this may have been a thromboembolic phenomenon.  She is on aspirin.  I am going arrange for her to go undergo angiography and potential endovascular therapy.      Lorretta Harp MD FACP,FACC,FAHA, [facility] 04/28/2018 3:45 PM

## 2018-05-01 ENCOUNTER — Telehealth: Payer: Self-pay | Admitting: Gastroenterology

## 2018-05-01 NOTE — Telephone Encounter (Signed)
Patient wants to cancel esophageal manometry scheduled on 05/10/18. Cancelled per patient request.

## 2018-05-02 ENCOUNTER — Encounter: Payer: Self-pay | Admitting: Family Medicine

## 2018-05-02 DIAGNOSIS — Z01812 Encounter for preprocedural laboratory examination: Secondary | ICD-10-CM | POA: Diagnosis not present

## 2018-05-03 LAB — BASIC METABOLIC PANEL
BUN/Creatinine Ratio: 12 (ref 9–23)
BUN: 10 mg/dL (ref 6–24)
CO2: 21 mmol/L (ref 20–29)
Calcium: 9.8 mg/dL (ref 8.7–10.2)
Chloride: 103 mmol/L (ref 96–106)
Creatinine, Ser: 0.83 mg/dL (ref 0.57–1.00)
GFR calc Af Amer: 92 mL/min/{1.73_m2} (ref >59)
GFR calc non Af Amer: 80 mL/min/{1.73_m2} (ref >59)
Glucose: 94 mg/dL (ref 65–99)
Potassium: 4.3 mmol/L (ref 3.5–5.2)
Sodium: 139 mmol/L (ref 134–144)

## 2018-05-03 LAB — CBC
Hematocrit: 39.4 % (ref 34.0–46.6)
Hemoglobin: 13.6 g/dL (ref 11.1–15.9)
MCH: 30.6 pg (ref 26.6–33.0)
MCHC: 34.5 g/dL (ref 31.5–35.7)
MCV: 89 fL (ref 79–97)
Platelets: 231 10*3/uL (ref 150–450)
RBC: 4.44 x10E6/uL (ref 3.77–5.28)
RDW: 12.8 % (ref 12.3–15.4)
WBC: 8.1 10*3/uL (ref 3.4–10.8)

## 2018-05-03 LAB — TSH: TSH: 0.769 u[IU]/mL (ref 0.450–4.500)

## 2018-05-04 ENCOUNTER — Ambulatory Visit: Payer: Self-pay | Admitting: Gastroenterology

## 2018-05-05 ENCOUNTER — Encounter: Payer: Self-pay | Admitting: Family Medicine

## 2018-05-05 DIAGNOSIS — R079 Chest pain, unspecified: Secondary | ICD-10-CM

## 2018-05-08 ENCOUNTER — Inpatient Hospital Stay (HOSPITAL_COMMUNITY)
Admission: RE | Admit: 2018-05-08 | Discharge: 2018-05-13 | DRG: 271 | Disposition: A | Discharge status: Home / Self Care | Payer: BLUE CROSS/BLUE SHIELD | Attending: Cardiology | Admitting: Cardiology

## 2018-05-08 ENCOUNTER — Encounter (HOSPITAL_COMMUNITY): Payer: Self-pay

## 2018-05-08 ENCOUNTER — Other Ambulatory Visit: Payer: Self-pay

## 2018-05-08 ENCOUNTER — Inpatient Hospital Stay (HOSPITAL_COMMUNITY)
Admission: RE | Disposition: A | Discharge status: Home / Self Care | Payer: Self-pay | Source: Home / Self Care | Attending: Cardiovascular Disease

## 2018-05-08 DIAGNOSIS — I70221 Atherosclerosis of native arteries of extremities with rest pain, right leg: Secondary | ICD-10-CM | POA: Diagnosis not present

## 2018-05-08 DIAGNOSIS — K219 Gastro-esophageal reflux disease without esophagitis: Secondary | ICD-10-CM | POA: Diagnosis present

## 2018-05-08 DIAGNOSIS — Z79899 Other long term (current) drug therapy: Secondary | ICD-10-CM | POA: Diagnosis not present

## 2018-05-08 DIAGNOSIS — Z959 Presence of cardiac and vascular implant and graft, unspecified: Secondary | ICD-10-CM

## 2018-05-08 DIAGNOSIS — I743 Embolism and thrombosis of arteries of the lower extremities: Secondary | ICD-10-CM | POA: Diagnosis not present

## 2018-05-08 DIAGNOSIS — I10 Essential (primary) hypertension: Secondary | ICD-10-CM | POA: Diagnosis not present

## 2018-05-08 DIAGNOSIS — Z7982 Long term (current) use of aspirin: Secondary | ICD-10-CM

## 2018-05-08 DIAGNOSIS — I739 Peripheral vascular disease, unspecified: Secondary | ICD-10-CM

## 2018-05-08 DIAGNOSIS — Z8249 Family history of ischemic heart disease and other diseases of the circulatory system: Secondary | ICD-10-CM

## 2018-05-08 DIAGNOSIS — Z6841 Body Mass Index (BMI) 40.0 and over, adult: Secondary | ICD-10-CM | POA: Diagnosis not present

## 2018-05-08 DIAGNOSIS — Z888 Allergy status to other drugs, medicaments and biological substances status: Secondary | ICD-10-CM | POA: Diagnosis not present

## 2018-05-08 DIAGNOSIS — R079 Chest pain, unspecified: Secondary | ICD-10-CM | POA: Diagnosis present

## 2018-05-08 DIAGNOSIS — I959 Hypotension, unspecified: Secondary | ICD-10-CM | POA: Diagnosis present

## 2018-05-08 DIAGNOSIS — I729 Aneurysm of unspecified site: Secondary | ICD-10-CM | POA: Diagnosis present

## 2018-05-08 DIAGNOSIS — D62 Acute posthemorrhagic anemia: Secondary | ICD-10-CM | POA: Diagnosis present

## 2018-05-08 DIAGNOSIS — Z9889 Other specified postprocedural states: Secondary | ICD-10-CM | POA: Diagnosis not present

## 2018-05-08 DIAGNOSIS — Y838 Other surgical procedures as the cause of abnormal reaction of the patient, or of later complication, without mention of misadventure at the time of the procedure: Secondary | ICD-10-CM | POA: Diagnosis not present

## 2018-05-08 DIAGNOSIS — E669 Obesity, unspecified: Secondary | ICD-10-CM | POA: Diagnosis present

## 2018-05-08 DIAGNOSIS — I998 Other disorder of circulatory system: Secondary | ICD-10-CM | POA: Diagnosis present

## 2018-05-08 DIAGNOSIS — M7981 Nontraumatic hematoma of soft tissue: Secondary | ICD-10-CM | POA: Diagnosis not present

## 2018-05-08 DIAGNOSIS — I70229 Atherosclerosis of native arteries of extremities with rest pain, unspecified extremity: Secondary | ICD-10-CM | POA: Diagnosis present

## 2018-05-08 DIAGNOSIS — T81718A Complication of other artery following a procedure, not elsewhere classified, initial encounter: Secondary | ICD-10-CM | POA: Diagnosis present

## 2018-05-08 DIAGNOSIS — I724 Aneurysm of artery of lower extremity: Secondary | ICD-10-CM | POA: Diagnosis not present

## 2018-05-08 DIAGNOSIS — G8929 Other chronic pain: Secondary | ICD-10-CM | POA: Diagnosis present

## 2018-05-08 HISTORY — DX: Aneurysm of unspecified site: I72.9

## 2018-05-08 HISTORY — DX: Acute posthemorrhagic anemia: D62

## 2018-05-08 HISTORY — DX: Complication of other artery following a procedure, not elsewhere classified, initial encounter: T81.718A

## 2018-05-08 HISTORY — PX: ABDOMINAL AORTOGRAM W/LOWER EXTREMITY: CATH118223

## 2018-05-08 HISTORY — PX: PERIPHERAL VASCULAR THROMBECTOMY: CATH118306

## 2018-05-08 HISTORY — PX: PERIPHERAL VASCULAR INTERVENTION: CATH118257

## 2018-05-08 HISTORY — DX: Peripheral vascular disease, unspecified: I73.9

## 2018-05-08 LAB — POCT ACTIVATED CLOTTING TIME
Activated Clotting Time: 230 seconds
Activated Clotting Time: 268 seconds
Activated Clotting Time: 241 seconds
Activated Clotting Time: 296 seconds
Activated Clotting Time: 257 seconds
Activated Clotting Time: 285 seconds
Activated Clotting Time: 224 seconds
Activated Clotting Time: 202 seconds
Activated Clotting Time: 186 seconds
Activated Clotting Time: 169 seconds

## 2018-05-08 LAB — MRSA PCR SCREENING: MRSA by PCR: NEGATIVE

## 2018-05-08 LAB — PREGNANCY, URINE: Preg Test, Ur: NEGATIVE

## 2018-05-08 SURGERY — ABDOMINAL AORTOGRAM W/LOWER EXTREMITY
Anesthesia: LOCAL | Laterality: Right

## 2018-05-08 MED ORDER — SODIUM CHLORIDE 0.9 % WEIGHT BASED INFUSION: 1.0000 mL/kg/h | INTRAVENOUS | Status: DC

## 2018-05-08 MED ORDER — ASPIRIN 81 MG PO CHEW
81.0000 mg | CHEWABLE_TABLET | ORAL | Status: AC
  Administered 2018-05-08: 81 mg via ORAL
  Filled 2018-05-08: qty 1

## 2018-05-08 MED ORDER — LABETALOL HCL 5 MG/ML IV SOLN: 10.0000 mg | INTRAVENOUS | Status: DC | PRN

## 2018-05-08 MED ORDER — NITROGLYCERIN IN D5W 200-5 MCG/ML-% IV SOLN
INTRAVENOUS | Status: AC | PRN
  Administered 2018-05-08: 5 ug/min via INTRAVENOUS

## 2018-05-08 MED ORDER — FENTANYL CITRATE (PF) 100 MCG/2ML IJ SOLN
INTRAMUSCULAR | Status: AC
  Filled 2018-05-08: qty 2

## 2018-05-08 MED ORDER — ACETAMINOPHEN 325 MG PO TABS
650.0000 mg | ORAL_TABLET | ORAL | Status: DC | PRN
  Administered 2018-05-09: 650 mg via ORAL
  Filled 2018-05-08: qty 2

## 2018-05-08 MED ORDER — MIDAZOLAM HCL 2 MG/2ML IJ SOLN
INTRAMUSCULAR | Status: DC | PRN
  Administered 2018-05-08 (×4): 1 mg via INTRAVENOUS

## 2018-05-08 MED ORDER — SODIUM CHLORIDE 0.9% FLUSH: 3.0000 mL | INTRAVENOUS | Status: DC | PRN

## 2018-05-08 MED ORDER — ATROPINE SULFATE 1 MG/10ML IJ SOSY
PREFILLED_SYRINGE | INTRAMUSCULAR | Status: AC
  Filled 2018-05-08: qty 10

## 2018-05-08 MED ORDER — FENTANYL CITRATE (PF) 100 MCG/2ML IJ SOLN
INTRAMUSCULAR | Status: DC | PRN
  Administered 2018-05-08: 50 ug via INTRAVENOUS
  Administered 2018-05-08 (×3): 25 ug via INTRAVENOUS

## 2018-05-08 MED ORDER — HEPARIN SODIUM (PORCINE) 1000 UNIT/ML IJ SOLN
INTRAMUSCULAR | Status: AC
  Filled 2018-05-08: qty 1

## 2018-05-08 MED ORDER — MIDAZOLAM HCL 2 MG/2ML IJ SOLN
INTRAMUSCULAR | Status: AC
  Filled 2018-05-08: qty 2

## 2018-05-08 MED ORDER — HEPARIN (PORCINE) IN NACL 1000-0.9 UT/500ML-% IV SOLN
INTRAVENOUS | Status: AC
  Filled 2018-05-08: qty 500

## 2018-05-08 MED ORDER — SODIUM CHLORIDE 0.9 % IV SOLN: 250.0000 mL | INTRAVENOUS | Status: DC | PRN

## 2018-05-08 MED ORDER — NITROGLYCERIN IN D5W 200-5 MCG/ML-% IV SOLN
INTRAVENOUS | Status: AC
  Filled 2018-05-08: qty 250

## 2018-05-08 MED ORDER — CARVEDILOL 6.25 MG PO TABS
6.2500 mg | ORAL_TABLET | Freq: Two times a day (BID) | ORAL | Status: DC
  Administered 2018-05-08 – 2018-05-13 (×9): 6.25 mg via ORAL
  Filled 2018-05-08 (×9): qty 1

## 2018-05-08 MED ORDER — HEPARIN SODIUM (PORCINE) 1000 UNIT/ML IJ SOLN
INTRAMUSCULAR | Status: DC | PRN
  Administered 2018-05-08: 4000 [IU] via INTRAVENOUS
  Administered 2018-05-08: 3000 [IU] via INTRAVENOUS
  Administered 2018-05-08: 10000 [IU] via INTRAVENOUS
  Administered 2018-05-08: 5000 [IU] via INTRAVENOUS
  Administered 2018-05-08: 4000 [IU] via INTRAVENOUS

## 2018-05-08 MED ORDER — SODIUM CHLORIDE 0.9% FLUSH: 3.0000 mL | Freq: Two times a day (BID) | INTRAVENOUS | Status: DC

## 2018-05-08 MED ORDER — ATORVASTATIN CALCIUM 80 MG PO TABS
80.0000 mg | ORAL_TABLET | Freq: Every day | ORAL | Status: DC
  Administered 2018-05-08 – 2018-05-12 (×5): 80 mg via ORAL
  Filled 2018-05-08 (×6): qty 1

## 2018-05-08 MED ORDER — HEPARIN (PORCINE) IN NACL 1000-0.9 UT/500ML-% IV SOLN
INTRAVENOUS | Status: DC | PRN
  Administered 2018-05-08 (×2): 500 mL

## 2018-05-08 MED ORDER — CLOPIDOGREL BISULFATE 300 MG PO TABS
ORAL_TABLET | ORAL | Status: DC | PRN
  Administered 2018-05-08: 300 mg via ORAL

## 2018-05-08 MED ORDER — ASPIRIN EC 81 MG PO TBEC
81.0000 mg | DELAYED_RELEASE_TABLET | Freq: Every day | ORAL | Status: DC
  Administered 2018-05-09 – 2018-05-13 (×5): 81 mg via ORAL
  Filled 2018-05-08 (×5): qty 1

## 2018-05-08 MED ORDER — HYDRALAZINE HCL 20 MG/ML IJ SOLN
INTRAMUSCULAR | Status: AC
  Filled 2018-05-08: qty 1

## 2018-05-08 MED ORDER — SODIUM CHLORIDE 0.9% FLUSH
3.0000 mL | Freq: Two times a day (BID) | INTRAVENOUS | Status: DC
  Administered 2018-05-08 – 2018-05-13 (×10): 3 mL via INTRAVENOUS

## 2018-05-08 MED ORDER — LIDOCAINE HCL (PF) 1 % IJ SOLN
INTRAMUSCULAR | Status: AC
  Filled 2018-05-08: qty 30

## 2018-05-08 MED ORDER — LOSARTAN POTASSIUM 50 MG PO TABS
100.0000 mg | ORAL_TABLET | Freq: Every day | ORAL | Status: DC
  Administered 2018-05-09 – 2018-05-13 (×5): 100 mg via ORAL
  Filled 2018-05-08 (×5): qty 2

## 2018-05-08 MED ORDER — HYDROCHLOROTHIAZIDE 25 MG PO TABS
25.0000 mg | ORAL_TABLET | Freq: Every day | ORAL | Status: DC
  Administered 2018-05-09 – 2018-05-13 (×5): 25 mg via ORAL
  Filled 2018-05-08 (×5): qty 1

## 2018-05-08 MED ORDER — CLOPIDOGREL BISULFATE 300 MG PO TABS
ORAL_TABLET | ORAL | Status: AC
  Filled 2018-05-08: qty 1

## 2018-05-08 MED ORDER — SODIUM CHLORIDE 0.9 % IV SOLN
INTRAVENOUS | Status: AC
  Administered 2018-05-08: 19:00:00 via INTRAVENOUS

## 2018-05-08 MED ORDER — HYDRALAZINE HCL 20 MG/ML IJ SOLN: 5.0000 mg | INTRAMUSCULAR | Status: DC | PRN

## 2018-05-08 MED ORDER — HYDRALAZINE HCL 20 MG/ML IJ SOLN
INTRAMUSCULAR | Status: DC | PRN
  Administered 2018-05-08 (×2): 10 mg via INTRAVENOUS

## 2018-05-08 MED ORDER — HEPARIN (PORCINE) 25000 UT/250ML-% IV SOLN
1450.0000 [IU]/h | INTRAVENOUS | Status: DC
  Administered 2018-05-09: 1450 [IU]/h via INTRAVENOUS
  Filled 2018-05-08: qty 250

## 2018-05-08 MED ORDER — MORPHINE SULFATE (PF) 2 MG/ML IV SOLN: 2.0000 mg | INTRAVENOUS | Status: DC | PRN

## 2018-05-08 MED ORDER — SODIUM CHLORIDE 0.9 % WEIGHT BASED INFUSION
3.0000 mL/kg/h | INTRAVENOUS | Status: DC
  Administered 2018-05-08: 3 mL/kg/h via INTRAVENOUS

## 2018-05-08 MED ORDER — ONDANSETRON HCL 4 MG/2ML IJ SOLN: 4.0000 mg | Freq: Four times a day (QID) | INTRAMUSCULAR | Status: DC | PRN

## 2018-05-08 MED ORDER — POTASSIUM CHLORIDE CRYS ER 10 MEQ PO TBCR
10.0000 meq | EXTENDED_RELEASE_TABLET | Freq: Every day | ORAL | Status: DC
  Administered 2018-05-08 – 2018-05-13 (×6): 10 meq via ORAL
  Filled 2018-05-08 (×6): qty 1

## 2018-05-08 MED ORDER — CLOPIDOGREL BISULFATE 75 MG PO TABS
75.0000 mg | ORAL_TABLET | Freq: Every day | ORAL | Status: DC
  Administered 2018-05-09 – 2018-05-13 (×5): 75 mg via ORAL
  Filled 2018-05-08 (×5): qty 1

## 2018-05-08 SURGICAL SUPPLY — 34 items
BAG SNAP BAND KOVER 36X36 (MISCELLANEOUS) ×1 IMPLANT
BALLN CHOCOLATE 3.0X40X150 (BALLOONS) ×2
BALLN CHOCOLATE 5.0X80X120 (BALLOONS) ×2
BALLN STERLING OTW 5X40X135 (BALLOONS) ×2
BALLOON CHOCOLATE 3.0X40X150 (BALLOONS) IMPLANT
BALLOON CHOCOLATE 5.0X80X120 (BALLOONS) IMPLANT
BALLOON STERLING OTW 5X40X135 (BALLOONS) IMPLANT
CANISTER PENUMBRA ENGINE (MISCELLANEOUS) ×1 IMPLANT
CATH ANGIO 5F PIGTAIL 65CM (CATHETERS) ×1 IMPLANT
CATH CROSS OVER TEMPO 5F (CATHETERS) ×1 IMPLANT
CATH CXI SUPP 2.6F 150 ANG (CATHETERS) ×1 IMPLANT
CATH INDIGO CAT6 KIT (CATHETERS) ×2 IMPLANT
CATH NAVICROSS ST .035X135CM (MICROCATHETER) ×1 IMPLANT
CATH STRAIGHT 5FR 65CM (CATHETERS) ×1 IMPLANT
DEVICE SPIDERFX EMB PROT 5MM (WIRE) ×1 IMPLANT
GLIDEWIRE ANGLED SS 035X260CM (WIRE) ×1 IMPLANT
KIT ENCORE 26 ADVANTAGE (KITS) ×1 IMPLANT
KIT PV (KITS) ×2 IMPLANT
SHEATH PINNACLE 5F 10CM (SHEATH) ×1 IMPLANT
SHEATH PINNACLE 6F 10CM (SHEATH) ×1 IMPLANT
SHEATH PINNACLE ST 6F 65CM (SHEATH) ×1 IMPLANT
SHEATH PROBE COVER 6X72 (BAG) ×1 IMPLANT
STENT TIGRIS 5X60X120 (Permanent Stent) ×1 IMPLANT
STENT TIGRIS 5X80X120 (Permanent Stent) ×1 IMPLANT
STOPCOCK MORSE 400PSI 3WAY (MISCELLANEOUS) ×1 IMPLANT
SYR MEDRAD MARK 7 150ML (SYRINGE) ×2 IMPLANT
TAPE VIPERTRACK RADIOPAQ (MISCELLANEOUS) IMPLANT
TAPE VIPERTRACK RADIOPAQUE (MISCELLANEOUS) ×1
TRANSDUCER W/STOPCOCK (MISCELLANEOUS) ×2 IMPLANT
TRAY PV CATH (CUSTOM PROCEDURE TRAY) ×2 IMPLANT
TUBING CIL FLEX 10 FLL-RA (TUBING) ×1 IMPLANT
WIRE HITORQ VERSACORE ST 145CM (WIRE) ×1 IMPLANT
WIRE ROSEN-J .035X260CM (WIRE) ×1 IMPLANT
WIRE SPARTACORE .014X300CM (WIRE) ×3 IMPLANT

## 2018-05-08 NOTE — Progress Notes (Signed)
Left femoral sheath pulled. Pressure held for 30 minutes. Site Level 0. Patient stable during sheath pull. Will continue to monitor site and patient.

## 2018-05-08 NOTE — Progress Notes (Signed)
ANTICOAGULATION CONSULT NOTE - Initial Consult  Pharmacy Consult for heparin Indication: DVT  Allergies  Allergen Reactions  . Lisinopril Cough    Patient Measurements: Height: 5\' 7"  (170.2 cm) Weight: 286 lb (129.7 kg) IBW/kg (Calculated) : 61.6 Heparin Dosing Weight: 92.8 kg  Vital Signs: Temp: 99.8 F (37.7 C) (01/06 1146) BP: 142/85 (01/06 1635) Pulse Rate: 0 (01/06 1635)  Labs: No results for input(s): HGB, HCT, PLT, APTT, LABPROT, INR, HEPARINUNFRC, HEPRLOWMOCWT, CREATININE, CKTOTAL, CKMB, TROPONINI in the last 72 hours.  Estimated Creatinine Clearance: 108.6 mL/min (by C-G formula based on SCr of 0.83 mg/dL).   Medical History: Past Medical History:  Diagnosis Date  . Chest pain   . Essential hypertension 07/08/2016  . GERD (gastroesophageal reflux disease)   . Uterine fibroid   . Vitamin D deficiency     Assessment: 4 yof who underwent abdominal/RLE angiogram with aspiration thrombectomy and stenting of R popliteal/anterior tibial artery on 1/6. Plan for heparin infusion to start 4 hours after sheath removal (documented on 1/6@2045 ). No anticoagulation PTA.  No s/sx of bleeding.   Goal of Therapy:  Heparin level 0.3-0.7 units/ml Monitor platelets by anticoagulation protocol: Yes   Plan:  Start heparin infusion at 1450 units/hr on 1/7 at 0100 Check anti-Xa level in 6 hours and daily while on heparin Continue to monitor H&H and platelets  Antonietta Jewel, PharmD, [place] Clinical Pharmacist  Pager: [phone] Phone: [phone] 05/08/2018,5:21 PM

## 2018-05-08 NOTE — Interval H&P Note (Signed)
History and Physical Interval Note:  05/08/2018 1:16 PM  Christina [name]  has presented today for surgery, with the diagnosis of pvd  The various methods of treatment have been discussed with the patient and family. After consideration of risks, benefits and other options for treatment, the patient has consented to  Procedure(s): ABDOMINAL AORTOGRAM W/LOWER EXTREMITY (N/A) as a surgical intervention .  The patient's history has been reviewed, patient examined, no change in status, stable for surgery.  I have reviewed the patient's chart and labs.  Questions were answered to the patient's satisfaction.     Quay Burow

## 2018-05-09 ENCOUNTER — Encounter (HOSPITAL_COMMUNITY): Payer: Self-pay | Admitting: Cardiovascular Disease

## 2018-05-09 ENCOUNTER — Ambulatory Visit: Payer: Self-pay | Admitting: Cardiovascular Disease

## 2018-05-09 ENCOUNTER — Encounter

## 2018-05-09 ENCOUNTER — Encounter: Payer: Self-pay | Admitting: Family Medicine

## 2018-05-09 DIAGNOSIS — I739 Peripheral vascular disease, unspecified: Secondary | ICD-10-CM

## 2018-05-09 LAB — CBC
HCT: 32.3 % — ABNORMAL LOW (ref 36.0–46.0)
Hemoglobin: 10.4 g/dL — ABNORMAL LOW (ref 12.0–15.0)
MCH: 30.1 pg (ref 26.0–34.0)
MCHC: 32.2 g/dL (ref 30.0–36.0)
MCV: 93.4 fL (ref 80.0–100.0)
Platelets: 214 10*3/uL (ref 150–400)
RBC: 3.46 MIL/uL — ABNORMAL LOW (ref 3.87–5.11)
RDW: 13.6 % (ref 11.5–15.5)
WBC: 10.1 10*3/uL (ref 4.0–10.5)
nRBC: 0 % (ref 0.0–0.2)

## 2018-05-09 LAB — BASIC METABOLIC PANEL
Anion gap: 7 (ref 5–15)
BUN: 10 mg/dL (ref 6–20)
CO2: 18 mmol/L — ABNORMAL LOW (ref 22–32)
Calcium: 8.7 mg/dL — ABNORMAL LOW (ref 8.9–10.3)
Chloride: 113 mmol/L — ABNORMAL HIGH (ref 98–111)
Creatinine, Ser: 0.74 mg/dL (ref 0.44–1.00)
GFR calc Af Amer: >60 mL/min (ref >60)
GFR calc non Af Amer: >60 mL/min (ref >60)
Glucose, Bld: 104 mg/dL — ABNORMAL HIGH (ref 70–99)
Potassium: 3.7 mmol/L (ref 3.5–5.1)
Sodium: 138 mmol/L (ref 135–145)

## 2018-05-09 LAB — HEPARIN LEVEL (UNFRACTIONATED): Heparin Unfractionated: 0.25 IU/mL — ABNORMAL LOW (ref 0.30–0.70)

## 2018-05-09 MED ORDER — APIXABAN 5 MG PO TABS
5.0000 mg | ORAL_TABLET | Freq: Two times a day (BID) | ORAL | Status: DC
  Administered 2018-05-09: 5 mg via ORAL
  Filled 2018-05-09: qty 1

## 2018-05-09 MED ORDER — APIXABAN 5 MG PO TABS
5.0000 mg | ORAL_TABLET | Freq: Two times a day (BID) | ORAL | Status: DC
  Administered 2018-05-09 – 2018-05-11 (×4): 5 mg via ORAL
  Filled 2018-05-09 (×4): qty 1

## 2018-05-09 MED FILL — Lidocaine HCl Local Preservative Free (PF) Inj 1%: INTRAMUSCULAR | Qty: 30 | Status: AC

## 2018-05-09 NOTE — Progress Notes (Signed)
ANTICOAGULATION CONSULT NOTE - Initial Consult  Pharmacy Consult for Apixaban Indication: DVT  Allergies  Allergen Reactions  . Lisinopril Cough    Patient Measurements: Height: 5\' 7"  (170.2 cm) Weight: 286 lb (129.7 kg) IBW/kg (Calculated) : 61.6 Heparin Dosing Weight: 92.8 kg  Vital Signs: Temp: 99.2 F (37.3 C) (01/07 1126) Temp Source: Oral (01/07 1126) BP: 130/76 (01/07 1400)  Labs: Recent Labs    05/09/18 0416  HGB 10.4*  HCT 32.3*  PLT 214  HEPARINUNFRC 0.25*  CREATININE 0.74    Estimated Creatinine Clearance: 112.7 mL/min (by C-G formula based on SCr of 0.74 mg/dL).   Medical History: Past Medical History:  Diagnosis Date  . Chest pain   . Essential hypertension 07/08/2016  . GERD (gastroesophageal reflux disease)   . Uterine fibroid   . Vitamin D deficiency     Assessment: 15 yof who underwent abdominal/RLE angiogram with aspiration thrombectomy and stenting of R popliteal/anterior tibial artery on 1/6. Patient was initiated on heparin infusion 4 hours after sheath removal, and had no anticoagulation PTA. Pharmacy has now been consulted for initiation of apixaban.   Per MD, plan is to initiate patient on apixaban 5 mg BID, without apixaban load, for prevention of possible thromboembolic arterial occlusion of the right leg. Patient has no s/sx of bleeding, and CBC is stable.   Plan:  Stop heparin infusion Start apixaban 5 mg PO BID F/u plans for duration of therapy Monitor for s/sx of bleeding  Thank you for allowing pharmacy to be a part of this patient's care.  Leron Croak, PharmD PGY1 Pharmacy Resident Phone: [phone]  Please check AMION for all [facility] phone numbers 05/09/2018,2:39 PM

## 2018-05-09 NOTE — Progress Notes (Signed)
Cache for Heparin Indication: DVT  Allergies  Allergen Reactions  . Lisinopril Cough    Patient Measurements: Height: 5\' 7"  (170.2 cm) Weight: 286 lb (129.7 kg) IBW/kg (Calculated) : 61.6 Heparin Dosing Weight: 92.8 kg  Vital Signs: Temp: 99.4 F (37.4 C) (01/07 0400) Temp Source: Oral (01/07 0400) BP: 146/92 (01/07 0500)  Labs: Recent Labs    05/09/18 0416  HGB 10.4*  HCT 32.3*  PLT 214  HEPARINUNFRC 0.25*    Estimated Creatinine Clearance: 108.6 mL/min (by C-G formula based on SCr of 0.83 mg/dL).   Medical History: Past Medical History:  Diagnosis Date  . Chest pain   . Essential hypertension 07/08/2016  . GERD (gastroesophageal reflux disease)   . Uterine fibroid   . Vitamin D deficiency     Assessment: 89 yof who underwent abdominal/RLE angiogram with aspiration thrombectomy and stenting of R popliteal/anterior tibial artery on 1/6. Plan for heparin infusion to start 4 hours after sheath removal (documented on 1/6@2045 ). No anticoagulation PTA.  No s/sx of bleeding.   1/7 AM update: heparin level drawn too early, drawn 3 hours after heparin start, will re-time  Goal of Therapy:  Heparin level 0.3-0.7 units/ml Monitor platelets by anticoagulation protocol: Yes   Plan:  Cont heparin 1450 units/hr Re-time heparin level for 0900  Narda Bonds, PharmD, [place] Pharmacist Phone: [phone]

## 2018-05-09 NOTE — Telephone Encounter (Signed)
Patient is currently in hospital. Most recent note states anticipate D/C tomorrow if stable. Patient needs a work note/return to work note.

## 2018-05-09 NOTE — Progress Notes (Signed)
Progress Note  Patient Name: Christina [name] Date of Encounter: 05/09/2018  Primary Cardiologist: Mertie Moores, MD  PV: Gwenlyn Found  Subjective   No leg pain, chest pain this am. Left groin sore  Inpatient Medications    Scheduled Meds: . aspirin EC  81 mg Oral Daily  . atorvastatin  80 mg Oral q1800  . carvedilol  6.25 mg Oral BID  . clopidogrel  75 mg Oral Daily  . hydrochlorothiazide  25 mg Oral Daily  . losartan  100 mg Oral Daily  . potassium chloride  10 mEq Oral Daily  . sodium chloride flush  3 mL Intravenous Q12H   Continuous Infusions: . sodium chloride    . heparin 1,450 Units/hr (05/09/18 0900)   PRN Meds: sodium chloride, acetaminophen, hydrALAZINE, labetalol, morphine injection, ondansetron (ZOFRAN) IV, sodium chloride flush   Vital Signs    Vitals:   05/09/18 0700 05/09/18 0800 05/09/18 0900 05/09/18 0913  BP: 137/89 136/79 128/72   Pulse:      Resp: (!) 24 19 (!) 23   Temp:    99.2 F (37.3 C)  TempSrc:    Oral  SpO2: 100% 100% 100%   Weight:      Height:        Intake/Output Summary (Last 24 hours) at 05/09/2018 0929 Last data filed at 05/09/2018 0900 Gross per 24 hour  Intake 1230.92 ml  Output 900 ml  Net 330.92 ml   Filed Weights   05/08/18 1146  Weight: 129.7 kg    Telemetry    sinus - Personally Reviewed  ECG    No am ekg- Personally Reviewed  Physical Exam   General: Obese female in NAD  HEENT: OP clear, mucus membranes moist  SKIN: warm, dry. No rashes. Neuro: No focal deficits  Musculoskeletal: Muscle strength 5/5 all ext  Psychiatric: Mood and affect normal  Neck: No JVD, no carotid bruits, no thyromegaly, no lymphadenopathy.  Lungs:Clear bilaterally, no wheezes, rhonci, crackles Cardiovascular: Regular rate and rhythm. No murmurs, gallops or rubs. Abdomen:Soft. Bowel sounds present. Non-tender.  Extremities: No lower extremity edema. Palpable pulse right DP.   Labs    Chemistry Recent Labs  Lab 05/09/18 0416  NA  138  K 3.7  CL 113*  CO2 18*  GLUCOSE 104*  BUN 10  CREATININE 0.74  CALCIUM 8.7*  GFRNONAA >60  GFRAA >60  ANIONGAP 7     Hematology Recent Labs  Lab 05/09/18 0416  WBC 10.1  RBC 3.46*  HGB 10.4*  HCT 32.3*  MCV 93.4  MCH 30.1  MCHC 32.2  RDW 13.6  PLT 214    Cardiac EnzymesNo results for input(s): TROPONINI in the last 168 hours. No results for input(s): TROPIPOC in the last 168 hours.   BNPNo results for input(s): BNP, PROBNP in the last 168 hours.   DDimer No results for input(s): DDIMER in the last 168 hours.   Radiology    No results found.  Cardiac Studies     Patient Profile     52 y.o. female with PAD admitted post PV intervention  Assessment & Plan    1. PAD: Pt admitted following PTA/stenting of the right popliteal artery and anterior tibial artery. She is doing well this am. H/H with expected fall after prolonged procedure. I reviewed her case this am with Dr. Gwenlyn Found. Will continue ASA and Plavix for one month. Will stop IV heparin today and start Eliquis 5 mg po BID. She will require anti-coagulation given possible thromboembolic  etiology of her arterial occlusion in the right leg. \  Transfer to telemetry unit. H/H in am. Likely d/c home tomorrow if stable.   For questions or updates, please contact [place] Please consult [email or web address] for contact info under        Signed, Lauree Chandler, MD  05/09/2018, 9:29 AM

## 2018-05-10 ENCOUNTER — Ambulatory Visit (HOSPITAL_COMMUNITY)
Admission: RE | Admit: 2018-05-10 | Payer: BLUE CROSS/BLUE SHIELD | Source: Home / Self Care | Admitting: Gastroenterology

## 2018-05-10 ENCOUNTER — Encounter: Payer: Self-pay | Admitting: Family Medicine

## 2018-05-10 ENCOUNTER — Encounter (HOSPITAL_COMMUNITY): Admission: RE | Payer: Self-pay | Source: Home / Self Care

## 2018-05-10 DIAGNOSIS — D62 Acute posthemorrhagic anemia: Secondary | ICD-10-CM

## 2018-05-10 LAB — BASIC METABOLIC PANEL
Anion gap: 7 (ref 5–15)
BUN: 9 mg/dL (ref 6–20)
CO2: 20 mmol/L — ABNORMAL LOW (ref 22–32)
Calcium: 8.7 mg/dL — ABNORMAL LOW (ref 8.9–10.3)
Chloride: 110 mmol/L (ref 98–111)
Creatinine, Ser: 0.81 mg/dL (ref 0.44–1.00)
GFR calc Af Amer: >60 mL/min (ref >60)
GFR calc non Af Amer: >60 mL/min (ref >60)
Glucose, Bld: 97 mg/dL (ref 70–99)
Potassium: 3.6 mmol/L (ref 3.5–5.1)
Sodium: 137 mmol/L (ref 135–145)

## 2018-05-10 LAB — CBC
HCT: 29.9 % — ABNORMAL LOW (ref 36.0–46.0)
Hemoglobin: 9.7 g/dL — ABNORMAL LOW (ref 12.0–15.0)
MCH: 30.9 pg (ref 26.0–34.0)
MCHC: 32.4 g/dL (ref 30.0–36.0)
MCV: 95.2 fL (ref 80.0–100.0)
Platelets: 197 10*3/uL (ref 150–400)
RBC: 3.14 MIL/uL — ABNORMAL LOW (ref 3.87–5.11)
RDW: 13.8 % (ref 11.5–15.5)
WBC: 7.6 10*3/uL (ref 4.0–10.5)
nRBC: 0 % (ref 0.0–0.2)

## 2018-05-10 SURGERY — MANOMETRY, ESOPHAGUS

## 2018-05-10 SURGERY — Surgical Case
Anesthesia: *Unknown

## 2018-05-10 MED ORDER — FERROUS GLUCONATE 324 (38 FE) MG PO TABS
324.0000 mg | ORAL_TABLET | Freq: Every day | ORAL | Status: DC
  Administered 2018-05-10 – 2018-05-13 (×4): 324 mg via ORAL
  Filled 2018-05-10 (×4): qty 1

## 2018-05-10 MED ORDER — SORBITOL 70 % SOLN
30.0000 mL | Freq: Once | Status: AC
  Administered 2018-05-10: 30 mL via ORAL
  Filled 2018-05-10: qty 30

## 2018-05-10 NOTE — Discharge Instructions (Addendum)
Information on my medicine - ELIQUIS (apixaban)  This medication education was reviewed with me or my healthcare representative as part of my discharge preparation.  The pharmacist that spoke with me during my hospital stay was:  Georgina Peer, [facility]  Why was Eliquis prescribed for you? Eliquis was prescribed to treat blood clots that may have been found in the veins of your legs (deep vein thrombosis) or in your lungs (pulmonary embolism) and to reduce the risk of them occurring again.  What do You need to know about Eliquis ? Take ONE 5 mg tablet taken TWICE daily.  Eliquis may be taken with or without food.   Try to take the dose about the same time in the morning and in the evening. If you have difficulty swallowing the tablet whole please discuss with your pharmacist how to take the medication safely.  Take Eliquis exactly as prescribed and DO NOT stop taking Eliquis without talking to the doctor who prescribed the medication.  Stopping may increase your risk of developing a new blood clot.  Refill your prescription before you run out.  After discharge, you should have regular check-up appointments with your healthcare provider that is prescribing your Eliquis.    What do you do if you miss a dose? If a dose of ELIQUIS is not taken at the scheduled time, take it as soon as possible on the same day and twice-daily administration should be resumed. The dose should not be doubled to make up for a missed dose.  Important Safety Information A possible side effect of Eliquis is bleeding. You should call your healthcare provider right away if you experience any of the following: ? Bleeding from an injury or your nose that does not stop. ? Unusual colored urine (red or dark brown) or unusual colored stools (red or black). ? Unusual bruising for unknown reasons. ? A serious fall or if you hit your head (even if there is no bleeding).  Some medicines may interact with Eliquis and  might increase your risk of bleeding or clotting while on Eliquis. To help avoid this, consult your healthcare provider or pharmacist prior to using any new prescription or non-prescription medications, including herbals, vitamins, non-steroidal anti-inflammatory drugs (NSAIDs) and supplements.  This website has more information on Eliquis (apixaban): http://[email or web address].com/eliquis/home  - - - - - - - - - - - - - - - - - - - - - - - - - - - - - - - - - - - - - - - - - - - - - - - - - - - - - - - - - - - - -  Information about your medication: Plavix (anti-platelet agent)  Generic Name (Brand): clopidogrel (Plavix), once daily medication  PURPOSE: You are taking this medication along with aspirin to lower your chance of having a heart attack, stroke, or blood clots in your heart stent. These can be fatal. Brilinta and aspirin help prevent platelets from sticking together and forming a clot that can block an artery or your stent.   Common SIDE EFFECTS you may experience include: bruising or bleeding more easily, shortness of breath  Do not stop taking PLAVIX without talking to the doctor who prescribes it for you. People who are treated with a stent and stop taking Plavix too soon, have a higher risk of getting a blood clot in the stent, having a heart attack, or dying. If you stop Plavix because of bleeding, or for other  reasons, your risk of a heart attack or stroke may increase.   Tell all of your doctors and dentists that you are taking Plavix. They should talk to the doctor who prescribed plavix for you before you have any surgery or invasive procedure.   Contact your health care provider if you experience: severe or uncontrollable bleeding, pink/red/brown urine, vomiting blood or vomit that looks like "coffee grounds", red or black stools (looks like tar), coughing up blood or blood  clots ----------------------------------------------------------------------------------------------------------------------

## 2018-05-10 NOTE — Progress Notes (Addendum)
Progress Note  Patient Name: Christina [name] Date of Encounter: 05/10/2018  Primary Cardiologist: Mertie Moores, MD  PV: Dr. Gwenlyn Found  Subjective   No chest pain, shortness of breath, palpitation or dizziness.  Has not ambulated yet.  No bowel movement since admit.  Inpatient Medications    Scheduled Meds: . apixaban  5 mg Oral BID  . aspirin EC  81 mg Oral Daily  . atorvastatin  80 mg Oral q1800  . carvedilol  6.25 mg Oral BID  . clopidogrel  75 mg Oral Daily  . hydrochlorothiazide  25 mg Oral Daily  . losartan  100 mg Oral Daily  . potassium chloride  10 mEq Oral Daily  . sodium chloride flush  3 mL Intravenous Q12H  . sorbitol  30 mL Oral Once   Continuous Infusions: . sodium chloride     PRN Meds: sodium chloride, acetaminophen, hydrALAZINE, labetalol, morphine injection, ondansetron (ZOFRAN) IV, sodium chloride flush   Vital Signs    Vitals:   05/09/18 1500 05/09/18 1530 05/09/18 1958 05/10/18 0523  BP:  122/84 121/66 102/78  Pulse:  94 86 93  Resp: (!) 23     Temp:  99.5 F (37.5 C)  98.9 F (37.2 C)  TempSrc:  Oral  Oral  SpO2: 100% 97% 100% 95%  Weight:    125.8 kg  Height:        Intake/Output Summary (Last 24 hours) at 05/10/2018 1015 Last data filed at 05/09/2018 2130 Gross per 24 hour  Intake 754.93 ml  Output 600 ml  Net 154.93 ml   Filed Weights   05/08/18 1146 05/10/18 0523  Weight: 129.7 kg 125.8 kg    Telemetry    Sinus rhythm at controlled ventricular rate- Personally Reviewed  ECG    Not applicable Physical Exam   GEN:  Obese female in no  acute distress.   Neck: No JVD Cardiac: RRR, no murmurs, rubs, or gallops.  Left groin cath site has mild to moderate ecchymosis without erythema Respiratory: Clear to auscultation bilaterally. GI: Soft, nontender, non-distended  MS: No edema; No deformity. Neuro:  Nonfocal  Psych: Normal affect   Labs    Chemistry Recent Labs  Lab 05/09/18 0416 05/10/18 0511  NA 138 137  K 3.7 3.6    CL 113* 110  CO2 18* 20*  GLUCOSE 104* 97  BUN 10 9  CREATININE 0.74 0.81  CALCIUM 8.7* 8.7*  GFRNONAA >60 >60  GFRAA >60 >60  ANIONGAP 7 7     Hematology Recent Labs  Lab 05/09/18 0416 05/10/18 0511  WBC 10.1 7.6  RBC 3.46* 3.14*  HGB 10.4* 9.7*  HCT 32.3* 29.9*  MCV 93.4 95.2  MCH 30.1 30.9  MCHC 32.2 32.4  RDW 13.6 13.8  PLT 214 197     Radiology    No results found.  Cardiac Studies   PV Angiogram/Intervention  Angiographic Data:   1: Abdominal aorta- widely patent 2: Right lower extremity- the right popliteal artery was occluded at the level of the knee down to the anterior tibial tibioperoneal trunk and posterior tibial artery.  IMPRESSION: Ms. Vincent has what appears to be a subacute thrombotic occlusion of her popliteal artery with ongoing severe lifestyle limiting claudication.  We will proceed with penumbra aspiration thrombectomy, PTA plus or minus stenting.  Procedure Description: The patient received 26 units of heparin with an ACT of 285.  A total of 205 cc of contrast was administered to the patient with a sedation time  of 183 minutes.  Fluoroscopy time was 60.5 minutes.  I placed a 6 French 65 cm destination sheath into the mid right SFA over the iliac bifurcation.  Using 135 cm Nava cross endhole catheter along with a stiff angled Glidewire 0.35, I was able to cross this CTO with mild difficulty.  The wire went into the anterior tibial artery.  Then exchanged for a [place] core wire and performed penumbra aspiration thrombectomy with CAT 6 catheter removing a significant amount of thrombotic material.  Following this I performed balloon angioplasty with a 5 mm x 80 mm long chocolate balloon in the popliteal artery.  Angiography revealed a significant amount of residual thrombus.  Multiple passes were then performed with the penumbra device in the popliteal artery into the anterior tibial artery.  Following this I placed a Tigris 5 x 80 self-expanding  stent in the popliteal artery and postdilated with 5 mm x 4 cm balloons.  There is a significant bout of thrombus at the bifurcation of the anterior tibial and tibioperoneal trunk which could not be aspirated with penumbra of both the anterior tibial and the tibioperoneal trunk.  I did place a 5 mm spider distal protection device in the posterior tibial artery and attempted to capture the thrombus in the penumbra device unsuccessfully.  Following this I placed a 5 mm x 60 mm Tigris in the anterior tibial back into the below-knee popliteal artery overlapping the previous stent.  Unfortunately, the tibioperoneal trunk occluded during this procedure although the patient did have excellent inline flow to the foot via the anterior tibial artery with a 2+ pedal pulse at the end of the case.  The 65 cm destination sheath was then withdrawn over the bifurcation and exchanged over an 035 wire for a short 6 French sheath which was then secured.  The patient received 300 mg of p.o. Plavix.  Final Impression: Successful penumbra aspiration thrombectomy, PTA and self-expanding stenting using overlapping Tigris self-expanding stents of a long thrombotic occlusion of the popliteal, anterior tibial and tibioperoneal trunk.  Etiology of this is unclear.  It could potentially have been thromboembolic.  The patient does have a palpable pulse on either level of the foot at the end of the case.  She will need to be on dual antiplatelet therapy including aspirin and Plavix.  Heparin be restarted 4 hours after sheath removal without a bolus.  Ultimately, she will need to be started on a novel oral anticoagulant such as Eliquis.  She left the lab in stable condition.  Because of the prolonged fluoroscopy time she will need to be followed carefully as an outpatient.   Patient Profile     53 y.o. female with history of hypertension, chronic chest pain with recent negative Myoview and negative coronary CT presented for PV angiogram  for claudication symptoms.  Assessment & Plan    1.  Peripheral artery disease - S/p successful penumbra aspiration thrombectomy, PTA and self-expanding stenting using overlapping Tigris self-expanding stents of a long thrombotic occlusion of the popliteal, anterior tibial and tibioperoneal trunk. -Possible thromboembolic etiology.  She was treated with IV heparin and transition to Eliquis 5 mg twice daily.  Plan to continue dual antiplatelet therapy with aspirin and Plavix for 1 month per Dr. Angelena Form and Dr. Naida Sleight discussion. -Ambulate today.  Outpatient study followed by appointment with Dr. Alvester Chou.  2.  Acute anemia - Her hemoglobin was 13.6 pre-procedure. Here trending down 10.4>>>9.7.  - H/H with expected fall after prolonged procedure.  However,  needs close follow-up. -Ecchymosis noted without hematoma at the cath site. -She does not had any bowel movement since admit.  Will give laxative. -If going home she will need CBC check in few days.  For questions or updates, please contact [place] Please consult [email or web address] for contact info under       SignedLeanor Kail, PA  05/10/2018, 10:15 AM    Patient seen, examined. Available data reviewed. Agree with findings, assessment, and plan as outlined by Robbie Lis, PA.  On exam the left groin has a hematoma but no firmness.  There is tenderness to palpation.  There is no significant tenderness in the left lower quadrant of the abdomen or the left flank area.  The patient's hemoglobin has dropped about 4 g.  She has not been out of bed.  Will mobilize her today, get her up to the chair, repeat a CBC in the morning, and anticipate discharge tomorrow.  We will start her on iron.  Note she is on triple therapy with aspirin, clopidogrel, and apixaban.  Sherren Mocha, M.D. 05/10/2018 11:10 AM

## 2018-05-11 ENCOUNTER — Inpatient Hospital Stay (HOSPITAL_COMMUNITY): Payer: BLUE CROSS/BLUE SHIELD

## 2018-05-11 DIAGNOSIS — Z959 Presence of cardiac and vascular implant and graft, unspecified: Secondary | ICD-10-CM

## 2018-05-11 DIAGNOSIS — M7981 Nontraumatic hematoma of soft tissue: Secondary | ICD-10-CM

## 2018-05-11 DIAGNOSIS — I724 Aneurysm of artery of lower extremity: Secondary | ICD-10-CM

## 2018-05-11 DIAGNOSIS — I998 Other disorder of circulatory system: Secondary | ICD-10-CM

## 2018-05-11 LAB — CBC
HCT: 29.1 % — ABNORMAL LOW (ref 36.0–46.0)
Hemoglobin: 9.3 g/dL — ABNORMAL LOW (ref 12.0–15.0)
MCH: 30.2 pg (ref 26.0–34.0)
MCHC: 32 g/dL (ref 30.0–36.0)
MCV: 94.5 fL (ref 80.0–100.0)
Platelets: 202 10*3/uL (ref 150–400)
RBC: 3.08 MIL/uL — ABNORMAL LOW (ref 3.87–5.11)
RDW: 13.8 % (ref 11.5–15.5)
WBC: 8.2 10*3/uL (ref 4.0–10.5)
nRBC: 0 % (ref 0.0–0.2)

## 2018-05-11 MED ORDER — LOPERAMIDE HCL 2 MG PO CAPS
2.0000 mg | ORAL_CAPSULE | ORAL | Status: DC | PRN
  Administered 2018-05-11: 2 mg via ORAL
  Filled 2018-05-11: qty 1

## 2018-05-11 NOTE — Progress Notes (Signed)
LOWER EXTREMITY ARTERIAL PSEUDOANEURYSM EVALUATION has been completed.   Patient is positive in the left groin for a pseudo.    Preliminary results in CV Proc.   Abram Sander 05/11/2018 11:53 AM

## 2018-05-11 NOTE — Consult Note (Addendum)
Hospital Consult    Reason for Consult:  L CFA pseudoaneurysm Requesting Physician:  Dr. Gwenlyn Found MRN #:  [id number]  History of Present Illness: This is a 53 y.o. female who underwent penumbra thrombectomy right popliteal and tibial arteries with Tigris stenting of popliteal and ATA by Dr. Alvester Chou on 05/08/2018.  She later developed a hematoma in her left groin.  She also experienced a drop in hemoglobin prompting a noncontrasted CT scan this morning.  CT scan negative for retroperitoneal hemorrhage.  Work-up has also included a ultrasound of left groin which demonstrates a 2 x 2 and half centimeter pseudoaneurysm off of the left common femoral artery.  She has been on Eliquis since her procedure.  Patient states she has severe pain when walking and does not feel she can go home in this condition.  She denies any rest pain bilateral lower extremities.    Past Medical History:  Diagnosis Date  . Chest pain   . Essential hypertension 07/08/2016  . GERD (gastroesophageal reflux disease)   . Uterine fibroid   . Vitamin D deficiency     Past Surgical History:  Procedure Laterality Date  . ABDOMINAL AORTOGRAM W/LOWER EXTREMITY Right 05/08/2018   Procedure: ABDOMINAL AORTOGRAM W/LOWER EXTREMITY;  Surgeon: Lorretta Harp, MD;  Location: [facility];  Service: Cardiovascular;  Laterality: Right;  . lump removed from breast at age 75    . ovarian cyst removed at 53yr old    . PERIPHERAL VASCULAR INTERVENTION Right 05/08/2018   Procedure: PERIPHERAL VASCULAR INTERVENTION;  Surgeon: Lorretta Harp, MD;  Location: [facility];  Service: Cardiovascular;  Laterality: Right;  Anterior tibial and popliteal stents  . PERIPHERAL VASCULAR THROMBECTOMY Right 05/08/2018   Procedure: PERIPHERAL VASCULAR THROMBECTOMY;  Surgeon: Lorretta Harp, MD;  Location: [facility];  Service: Cardiovascular;  Laterality: Right;  Popliteal, tibioperoneal trunk, Anterior tibial, Posterior tibial     Allergies  Allergen Reactions  . Lisinopril Cough    Prior to Admission medications   Medication Sig Start Date End Date Taking? Authorizing Provider  carvedilol (COREG) 6.25 MG tablet TAKE 1 TABLET BY MOUTH TWICE DAILY Patient taking differently: Take 6.25 mg by mouth 2 (two) times daily.  04/27/18  Yes Nahser, Wonda Cheng, MD  hydrochlorothiazide (HYDRODIURIL) 25 MG tablet Take 25 mg by mouth daily.   Yes [provider]  losartan (COZAAR) 100 MG tablet Take 100 mg by mouth daily.   Yes [provider]  megestrol (MEGACE) 40 MG tablet Take 1 tablet (40 mg total) by mouth 2 (two) times daily. 40 mg daily. May take additional 40 mg in the event of heavy bleeding. Patient taking differently: Take 40 mg by mouth See admin instructions. Take 1 tablet (40 mg) by mouth scheduled once daily. May take additional 40 mg in the event of heavy bleeding. 09/28/17  Yes Harraway-Smith, Hoyle Sauer, MD  potassium chloride (K-DUR,KLOR-CON) 10 MEQ tablet Take 1 tablet (10 mEq total) by mouth daily. 04/10/18  Yes Ann Held, DO  cilostazol (PLETAL) 100 MG tablet Take 1 tablet (100 mg total) by mouth 2 (two) times daily. Patient not taking: Reported on 05/02/2018 04/14/18   Carollee Herter, Alferd Apa, DO  losartan-hydrochlorothiazide (HYZAAR) 100-25 MG tablet Take 1 tablet by mouth daily. Patient not taking: Reported on 05/02/2018 03/06/18   Copland, Gay Filler, MD    Social History   Socioeconomic History  . Marital status: Single    Spouse name: Not on file  .  Number of children: Not on file  . Years of education: Not on file  . Highest education level: Not on file  Occupational History  . Not on file  Social Needs  . Financial resource strain: Not on file  . Food insecurity:    Worry: Not on file    Inability: Not on file  . Transportation needs:    Medical: Not on file    Non-medical: Not on file  Tobacco Use  . Smoking status: Never Smoker  . Smokeless tobacco: Never Used   Substance and Sexual Activity  . Alcohol use: No  . Drug use: No  . Sexual activity: Yes  Lifestyle  . Physical activity:    Days per week: Not on file    Minutes per session: Not on file  . Stress: Not on file  Relationships  . Social connections:    Talks on phone: Not on file    Gets together: Not on file    Attends religious service: Not on file    Active member of club or organization: Not on file    Attends meetings of clubs or organizations: Not on file    Relationship status: Not on file  . Intimate partner violence:    Fear of current or ex partner: Not on file    Emotionally abused: Not on file    Physically abused: Not on file    Forced sexual activity: Not on file  Other Topics Concern  . Not on file  Social History Narrative  . Not on file     Family History  Problem Relation Age of Onset  . Hypertension Mother   . Dementia Father   . Colon polyps Neg Hx   . Crohn's disease Neg Hx   . Rectal cancer Neg Hx   . Stomach cancer Neg Hx   . Pancreatic cancer Neg Hx     ROS: Otherwise negative unless mentioned in HPI  Physical Examination  Vitals:   05/10/18 2145 05/11/18 0628  BP: 114/66 (!) 99/59  Pulse: 98 90  Resp: 16 16  Temp: 99.3 F (37.4 C) 99 F (37.2 C)  SpO2: 100% 99%   Body mass index is 43.27 kg/m.  General:  WDWN in NAD Gait: Not observed HENT: WNL, normocephalic Pulmonary: normal non-labored breathing, without Rales, rhonchi,  wheezing Cardiac: regular Abdomen:  soft, NT/ND, no masses Skin: without rashes Vascular Exam/Pulses: Palpable left DP pulse; right foot warm to touch with good capillary refill Extremities: Left groin local ecchymosis over sizable area; firm and painful to touch in area of stick site Musculoskeletal: no muscle wasting or atrophy  Neurologic: A&O X 3;  No focal weakness or paresthesias are detected; speech is fluent/normal Psychiatric:  The pt has Normal affect. Lymph:  Unremarkable  CBC     Component Value Date/Time   WBC 8.2 05/11/2018 0328   RBC 3.08 (L) 05/11/2018 0328   HGB 9.3 (L) 05/11/2018 0328   HGB 13.6 05/02/2018 0813   HGB 13.4 04/22/2017 1421   HCT 29.1 (L) 05/11/2018 0328   HCT 39.4 05/02/2018 0813   HCT 41.3 04/22/2017 1421   PLT 202 05/11/2018 0328   PLT 231 05/02/2018 0813   MCV 94.5 05/11/2018 0328   MCV 89 05/02/2018 0813   MCV 89 04/22/2017 1421   MCH 30.2 05/11/2018 0328   MCHC 32.0 05/11/2018 0328   RDW 13.8 05/11/2018 0328   RDW 12.8 05/02/2018 0813   RDW 16.1 (H) 04/22/2017 1421  LYMPHSABS 1.7 04/03/2018 1546   LYMPHSABS 1.8 04/22/2017 1421   MONOABS 0.5 04/03/2018 1546   EOSABS 0.3 04/03/2018 1546   EOSABS 0.2 04/22/2017 1421   BASOSABS 0.1 04/03/2018 1546   BASOSABS 0.0 04/22/2017 1421    BMET    Component Value Date/Time   NA 137 05/10/2018 0511   NA 139 05/02/2018 0813   K 3.6 05/10/2018 0511   CL 110 05/10/2018 0511   CO2 20 (L) 05/10/2018 0511   GLUCOSE 97 05/10/2018 0511   BUN 9 05/10/2018 0511   BUN 10 05/02/2018 0813   CREATININE 0.81 05/10/2018 0511   CALCIUM 8.7 (L) 05/10/2018 0511   GFRNONAA >60 05/10/2018 0511   GFRAA >60 05/10/2018 0511    COAGS: Lab Results  Component Value Date   INR 1.1 04/22/2017     Non-Invasive Vascular Imaging:   CT abdomen pelvis negative for retroperitoneal hemorrhage  Ultrasound demonstrating 2 x 2-1/2 cm pseudoaneurysm in left groin with a 1 x 1 cm neck   ASSESSMENT/PLAN: This is a 54 y.o. female status post endovascular revascularization who developed a pseudoaneurysm in her left groin  -CT negative for retroperitoneal hemorrhage -Patient seems to be perfusing bilateral lower extremities well -No sign or symptom of active bleeding -No indication for emergent repair of left common femoral artery and patient not a candidate for thrombin injection with Eliquis on board -Recommend holding Eliquis;  after an appropriate washout time period, we would recommend attempted  compression of pseudo with ultrasound -This case was discussed with on call vascular surgeon Dr. Donzetta Matters who will evaluate the patient later today and provide further treatment plan   Dagoberto Ligas PA-C [facility] [phone]  I have independently interviewed and examined patient and agree with PA assessment and plan above.  Bilateral pedal pulses are intact.  She is hemodynamically stable without any evidence of active bleeding although there is hematoma by both physical exam and CT.  Ultrasound images were reviewed which demonstrates 2.7 cm pseudoaneurysm with at least a 1 cm neck.  Given that she has been on triple therapy with Eliquis as well as dual antiplatelet therapy I would recommend holding at least Eliquis at this time for 24 to 48 hours and then we can begin consideration of treatment.  Options include compression, duplex directed thrombin injection, surgical revision, watchful waiting.  Given her pain I think she will need intervention during this hospitalization.  After Eliquis has been held I would start with compression and if she cannot tolerate this we will be available for thrombin injection either this weekend or early next week.  I discussed the options as well as the plan with the patient and she demonstrates good understanding.  Alajia Schmelzer C. Donzetta Matters, MD [facility] Office: [phone] Pager: [phone]

## 2018-05-11 NOTE — Progress Notes (Addendum)
Progress Note  Patient Name: Christina [name] Date of Encounter: 05/11/2018  Primary Cardiologist: Christina Moores, MD   Subjective   Still with abdominal pain and now has hematoma.  No chest pain or shortness of breath.  Inpatient Medications    Scheduled Meds: . apixaban  5 mg Oral BID  . aspirin EC  81 mg Oral Daily  . atorvastatin  80 mg Oral q1800  . carvedilol  6.25 mg Oral BID  . clopidogrel  75 mg Oral Daily  . ferrous gluconate  324 mg Oral Q breakfast  . hydrochlorothiazide  25 mg Oral Daily  . losartan  100 mg Oral Daily  . potassium chloride  10 mEq Oral Daily  . sodium chloride flush  3 mL Intravenous Q12H   Continuous Infusions: . sodium chloride     PRN Meds: sodium chloride, acetaminophen, hydrALAZINE, labetalol, morphine injection, ondansetron (ZOFRAN) IV, sodium chloride flush   Vital Signs    Vitals:   05/10/18 0523 05/10/18 1502 05/10/18 2145 05/11/18 0628  BP: 102/78 107/68 114/66 (!) 99/59  Pulse: 93 87 98 90  Resp:   16 16  Temp: 98.9 F (37.2 C) 98.3 F (36.8 C) 99.3 F (37.4 C) 99 F (37.2 C)  TempSrc: Oral Oral Oral Oral  SpO2: 95% 99% 100% 99%  Weight: 125.8 kg   125.3 kg  Height:        Intake/Output Summary (Last 24 hours) at 05/11/2018 1041 Last data filed at 05/10/2018 1504 Gross per 24 hour  Intake 240 ml  Output 1 ml  Net 239 ml   Filed Weights   05/08/18 1146 05/10/18 0523 05/11/18 0628  Weight: 129.7 kg 125.8 kg 125.3 kg    Telemetry    Normal sinus rhythm-  personally Reviewed  ECG    Not applicable  Physical Exam   GEN: No acute distress.   Neck: No JVD Cardiac: RRR, no murmurs, rubs, or gallops.  Left groin cath site has moderate hematoma today with extending ecchymosis from yesterday Respiratory: Clear to auscultation bilaterally. GI: Soft, nontender, non-distended  MS: No edema; No deformity. Neuro:  Nonfocal  Psych: Normal affect   Labs    Chemistry Recent Labs  Lab 05/09/18 0416 05/10/18 0511  NA  138 137  K 3.7 3.6  CL 113* 110  CO2 18* 20*  GLUCOSE 104* 97  BUN 10 9  CREATININE 0.74 0.81  CALCIUM 8.7* 8.7*  GFRNONAA >60 >60  GFRAA >60 >60  ANIONGAP 7 7     Hematology Recent Labs  Lab 05/09/18 0416 05/10/18 0511 05/11/18 0328  WBC 10.1 7.6 8.2  RBC 3.46* 3.14* 3.08*  HGB 10.4* 9.7* 9.3*  HCT 32.3* 29.9* 29.1*  MCV 93.4 95.2 94.5  MCH 30.1 30.9 30.2  MCHC 32.2 32.4 32.0  RDW 13.6 13.8 13.8  PLT 214 197 202    Radiology    No results found.  Cardiac Studies   PV Angiogram/Intervention  Angiographic Data:   1: Abdominal aorta- widely patent 2: Right lower extremity- the right popliteal artery was occluded at the level of the knee down to the anterior tibial tibioperoneal trunk and posterior tibial artery.  IMPRESSION:Christina [name] has what appears to be a subacute thrombotic occlusion of her popliteal artery with ongoing severe lifestyle limiting claudication. We will proceed with penumbra aspiration thrombectomy, PTA plus or minus stenting.  Procedure Description: The patient received 26 units of heparin with an ACT of 285. A total of 205 cc of contrast was  administered to the patient with a sedation time of 183 minutes. Fluoroscopy time was 60.5 minutes. I placed a 6 French 65 cm destination sheath into the mid right SFA over the iliac bifurcation. Using 135 cm Nava cross endhole catheter along with a stiff angled Glidewire 0.35, I was able to cross this CTO with mild difficulty. The wire went into the anterior tibial artery. Then exchanged for a [place] core wire and performed penumbra aspiration thrombectomy with CAT 6 catheter removing a significant amount of thrombotic material. Following this I performed balloon angioplasty with a 5 mm x 80 mm long chocolate balloon in the popliteal artery. Angiography revealed a significant amount of residual thrombus. Multiple passes were then performed with the penumbra device in the popliteal artery into the  anterior tibial artery. Following this I placed a Tigris 5 x 80 self-expanding stent in the popliteal artery and postdilated with 5 mm x 4 cm balloons. There is a significant bout of thrombus at the bifurcation of the anterior tibial and tibioperoneal trunk which could not be aspirated with penumbra of both the anterior tibial and the tibioperoneal trunk. I did place a 5 mm spider distal protection device in the posterior tibial artery and attempted to capture the thrombus in the penumbra device unsuccessfully. Following this I placed a 5 mm x 60 mm Tigris in the anterior tibial back into the below-knee popliteal artery overlapping the previous stent. Unfortunately, the tibioperoneal trunk occluded during this procedure although the patient did have excellent inline flow to the foot via the anterior tibial artery with a 2+ pedal pulse at the end of the case. The 65 cm destination sheath was then withdrawn over the bifurcation and exchanged over an 035 wire for a short 6 French sheath which was then secured. The patient received 300 mg of p.o. Plavix.  Final Impression:Successful penumbra aspiration thrombectomy, PTA and self-expanding stenting using overlapping Tigris self-expanding stents of a long thrombotic occlusion of the popliteal, anterior tibial and tibioperoneal trunk. Etiology of this is unclear. It could potentially have been thromboembolic. The patient does have a palpable pulse on either level of the foot at the end of the case. She will need to be on dual antiplatelet therapy including aspirin and Plavix. Heparin be restarted 4 hours after sheath removal without a bolus. Ultimately, she will need to be started on a novel oral anticoagulant such as Eliquis. She left the lab in stable condition. Because of the prolonged fluoroscopy time she will need to be followed carefully as an outpatient.   Patient Profile     53 y.o. female with history of hypertension, chronic chest pain  with recent negative Myoview and negative coronary CT presented for PV angiogram for claudication symptoms.  Assessment & Plan    1.  Peripheral artery disease - S/p successful penumbra aspiration thrombectomy, PTA and self-expanding stenting using overlapping Tigris self-expanding stents of a long thrombotic occlusion of the popliteal, anterior tibial and tibioperoneal trunk. -Possible thromboembolic etiology.  She was treated with IV heparin and transition to Eliquis 5 mg twice daily.  Plan to continue dual antiplatelet therapy with aspirin and Plavix for 1 month per Dr. Angelena Form and Dr. Naida Sleight discussion. -.  Outpatient study followed by appointment with Dr. Alvester Chou.  2.  Acute anemia/groin hematoma - Her hemoglobin was 13.6 pre-procedure. Here trending down 10.4>>>9.7>> 9.4 today  -He has a groin pain with ambulation and now new hematoma with extending ecchymosis. -We will do noncontrast CT and ultrasound to rule out  retroperitoneal bleed and pseudoaneurysm respectively.   For questions or updates, please contact [place] Please consult [email or web address] for contact info under     SignedLeanor Kail, PA  05/11/2018, 10:41 AM    Patient seen, examined. Available data reviewed. Agree with findings, assessment, and plan as outlined by Robbie Lis, PA.  On my exam today the patient is alert, oriented, in no distress.  Lungs are clear, heart is regular rate and rhythm with a 2/6 systolic ejection murmur at the left lower sternal border, abdomen is soft nontender, extremities show no edema, the left groin hematoma is more firm today.  There is a bruit to auscultation.  The hematoma has extended distally compared to yesterday's exam.  The patient's hemoglobin is stable today.  Her hemodynamics are stable.  However, her exam has worsened with respect to her left groin hematoma and I have recommended a noncontrasted CT scan of the abdomen and pelvis as well as a left groin ultrasound  study.  Sherren Mocha, M.D. 05/11/2018 12:33 PM

## 2018-05-11 NOTE — Progress Notes (Signed)
Will hold Eliquis as recommended by vascular. Appreciate help.

## 2018-05-12 ENCOUNTER — Inpatient Hospital Stay (HOSPITAL_COMMUNITY): Payer: BLUE CROSS/BLUE SHIELD

## 2018-05-12 ENCOUNTER — Encounter (HOSPITAL_COMMUNITY): Payer: Self-pay

## 2018-05-12 DIAGNOSIS — I724 Aneurysm of artery of lower extremity: Secondary | ICD-10-CM

## 2018-05-12 LAB — CBC
HCT: 29.4 % — ABNORMAL LOW (ref 36.0–46.0)
Hemoglobin: 9.4 g/dL — ABNORMAL LOW (ref 12.0–15.0)
MCH: 30.3 pg (ref 26.0–34.0)
MCHC: 32 g/dL (ref 30.0–36.0)
MCV: 94.8 fL (ref 80.0–100.0)
Platelets: 222 10*3/uL (ref 150–400)
RBC: 3.1 MIL/uL — ABNORMAL LOW (ref 3.87–5.11)
RDW: 13.9 % (ref 11.5–15.5)
WBC: 9.7 10*3/uL (ref 4.0–10.5)
nRBC: 0 % (ref 0.0–0.2)

## 2018-05-12 LAB — HEMOGLOBIN AND HEMATOCRIT, BLOOD
HCT: 30.3 % — ABNORMAL LOW (ref 36.0–46.0)
Hemoglobin: 9.8 g/dL — ABNORMAL LOW (ref 12.0–15.0)

## 2018-05-12 MED ORDER — MORPHINE SULFATE (PF) 4 MG/ML IV SOLN
4.0000 mg | Freq: Once | INTRAVENOUS | Status: AC
  Administered 2018-05-12: 4 mg via INTRAVENOUS

## 2018-05-12 MED ORDER — THROMBIN FOR PERCUTANEOUS TREATMENT OF PSEUDOANEURYSM (5000UNITS/10ML)
Freq: Once | PERCUTANEOUS | Status: DC
  Filled 2018-05-12: qty 1

## 2018-05-12 MED ORDER — LIDOCAINE-EPINEPHRINE 1 %-1:100000 IJ SOLN
10.0000 mL | Freq: Once | INTRAMUSCULAR | Status: DC
  Filled 2018-05-12: qty 10

## 2018-05-12 MED ORDER — MORPHINE SULFATE (PF) 4 MG/ML IV SOLN
INTRAVENOUS | Status: AC
  Filled 2018-05-12: qty 1

## 2018-05-12 MED ORDER — SODIUM CHLORIDE 0.9 % IV BOLUS
250.0000 mL | Freq: Once | INTRAVENOUS | Status: AC
  Administered 2018-05-12: 250 mL via INTRAVENOUS

## 2018-05-12 NOTE — Progress Notes (Signed)
Pt c/o feeling weak and sweaty, BP 90s/60s, had similar experience yesterday, PA notified.

## 2018-05-12 NOTE — Op Note (Signed)
Date: May 12, 2018  Preoperative diagnosis: Left common femoral artery pseudoaneurysm  Postoperative diagnosis: Same  Seizure: Thrombin injection of left common femoral artery pseudoaneurysm  Surgeon: Dr. Marty Heck, MD  Assistant: Arlee Muslim, PA  Indications: Patient is a 53 year old female who underwent lower extremity percutaneous procedure earlier this week with left femoral access with cardiology.  She was noted to have groin pain postop and duplex revealed left common femoral pseudoaneurysm that was greater than 2.5 cm.  We initially recommended ultrasound compression but patient could not tolerate due to pain so we have now recommended thrombin injection after risk and benefits were discussed.  Findings: Successful thrombin injection of left common femoral pseudoaneurysm and it appears thrombosed after injection by duplex.  Details: The procedure was performed at the patient's bedside in her room.  After informed consent was obtained her left groin was prepped and draped in usual sterile fashion with chlorhexidine and then toweled out with four sterile drapes.  We then used a sterile ultrasound probe to identify the pseudoaneurysm as well as the neck of the pseudoaneurysm and the common femoral artery.  We injected approximately 10 mL's of 1% lidocaine with epinephrine over the skin site for anesthesia.  Then used a long 22-gauge spinal needle that was placed into the pseudoaneurysm as far away from the common femoral artery as possible under ultrasound guidance.  I then injected approximately 1.5 mL's of thrombin (5000 units/5 mL).  By duplex criteria there was no evidence of flow in the pseudoaneurysm after injection and it appears to be thrombosed.  Patient had flow down the femoral artery and still had a palpable pulse in her left foot.  Her left groin was then cleaned and dried.  Plan: We will repeat left lower extremity femoral duplex tomorrow to ensure pseudoaneurysm  remains thrombosed.  Marty Heck, MD [facility] Office: [phone] Pager: [phone]

## 2018-05-12 NOTE — Progress Notes (Signed)
Left thrombin injection preformed with Dr. Carlis Abbott.    Azar South,RVS  05/12/2018 2:02 PM

## 2018-05-12 NOTE — Progress Notes (Signed)
  Was called to beside given complaints of feeling weak and diaphoretic. SBP 90.  Pt is s/p LE PV procedure c/b groin pseudoaneurysm treated w/ thrombin injection.   Right groin with ecchymosis but soft. Exam negative for bruit. She denies anterior groin, flank and no LBP. However given hypotension, will check stat H/H to ensure no further drop in hgb. Will give IVF bolus, 250 mL (note last echo showed normal LVEF). Will hold tonight's dose of Coreg. RN notified of plan and will monitor closely. I will sign out to night MD to f/u on lab results and reassess pt tonight. Rounding team will see in the AM. RN will notify night attending if condition worsens.   Lyda Jester, PA-C 05/12/2018

## 2018-05-12 NOTE — Progress Notes (Addendum)
Progress Note  Patient Name: Christina [name] Date of Encounter: 05/12/2018  Primary Cardiologist: Mertie Moores, MD   Subjective   Pt currently refusing manual compression plan of pseudo aneurysm per VVS. Denies pain. Will contact [facility] for further guidance. Eliquis remains on hold    Inpatient Medications    Scheduled Meds: . aspirin EC  81 mg Oral Daily  . atorvastatin  80 mg Oral q1800  . carvedilol  6.25 mg Oral BID  . clopidogrel  75 mg Oral Daily  . ferrous gluconate  324 mg Oral Q breakfast  . hydrochlorothiazide  25 mg Oral Daily  . losartan  100 mg Oral Daily  . potassium chloride  10 mEq Oral Daily  . sodium chloride flush  3 mL Intravenous Q12H   Continuous Infusions: . sodium chloride     PRN Meds: sodium chloride, acetaminophen, hydrALAZINE, labetalol, loperamide, morphine injection, ondansetron (ZOFRAN) IV, sodium chloride flush   Vital Signs    Vitals:   05/11/18 1347 05/11/18 2026 05/12/18 0500 05/12/18 0729  BP: (!) 93/54 96/62 98/68  110/66  Pulse:  74 88 86  Resp:  18 18   Temp:  98.8 F (37.1 C) 98.6 F (37 C)   TempSrc:  Oral Oral   SpO2:  100% 98%   Weight:   124.3 kg   Height:       No intake or output data in the 24 hours ending 05/12/18 0755 Filed Weights   05/10/18 0523 05/11/18 0628 05/12/18 0500  Weight: 125.8 kg 125.3 kg 124.3 kg   Physical Exam   General: Well developed, well nourished, NAD Skin: Warm, dry, intact  Head: Normocephalic, atraumatic, clear, moist mucus membranes. Neck: Negative for carotid bruits. No JVD Lungs:Clear to ausculation bilaterally. No wheezes, rales, or rhonchi. Breathing is unlabored. Cardiovascular: RRR with S1 S2. No murmurs, rubs, gallops, or LV heave appreciated. MSK: Strength and tone appear normal for age. 5/5 in all extremities Extremities: No edema. No clubbing or cyanosis. DP/PT pulses 1+ bilaterally Neuro: Alert and oriented. No focal deficits. No facial asymmetry. MAE  spontaneously. Psych: Responds to questions appropriately with normal affect.    Labs    Chemistry Recent Labs  Lab 05/09/18 0416 05/10/18 0511  NA 138 137  K 3.7 3.6  CL 113* 110  CO2 18* 20*  GLUCOSE 104* 97  BUN 10 9  CREATININE 0.74 0.81  CALCIUM 8.7* 8.7*  GFRNONAA >60 >60  GFRAA >60 >60  ANIONGAP 7 7     Hematology Recent Labs  Lab 05/10/18 0511 05/11/18 0328 05/12/18 0503  WBC 7.6 8.2 9.7  RBC 3.14* 3.08* 3.10*  HGB 9.7* 9.3* 9.4*  HCT 29.9* 29.1* 29.4*  MCV 95.2 94.5 94.8  MCH 30.9 30.2 30.3  MCHC 32.4 32.0 32.0  RDW 13.8 13.8 13.9  PLT 197 202 222    Cardiac EnzymesNo results for input(s): TROPONINI in the last 168 hours. No results for input(s): TROPIPOC in the last 168 hours.   BNPNo results for input(s): BNP, PROBNP in the last 168 hours.   DDimer No results for input(s): DDIMER in the last 168 hours.   Radiology    Ct Abdomen Pelvis Wo Contrast  Result Date: 05/11/2018 CLINICAL DATA:  Left groin pain. Hematoma after angiography yesterday. Positive left groin pseudo aneurysm. EXAM: CT ABDOMEN AND PELVIS WITHOUT CONTRAST TECHNIQUE: Multidetector CT imaging of the abdomen and pelvis was performed following the standard protocol without IV contrast. COMPARISON:  None. FINDINGS: Lower chest: Unremarkable  Hepatobiliary: No focal abnormality in the liver on this study without intravenous contrast. There is no evidence for gallstones, gallbladder wall thickening, or pericholecystic fluid. No intrahepatic or extrahepatic biliary dilation. Pancreas: No focal mass lesion. No dilatation of the main duct. No intraparenchymal cyst. No peripancreatic edema. Spleen: No splenomegaly. No focal mass lesion. Adrenals/Urinary Tract: No adrenal nodule or mass. Scarring noted in the left kidney. Right kidney unremarkable on this noncontrast exam. No evidence for hydroureter. The urinary bladder appears normal for the degree of distention. Stomach/Bowel: Stomach is nondistended.  No gastric wall thickening. No evidence of outlet obstruction. Duodenum is normally positioned as is the ligament of Treitz. No small bowel wall thickening. No small bowel dilatation. The terminal ileum is normal. The appendix is normal. No gross colonic mass. No colonic wall thickening. Vascular/Lymphatic: No abdominal aortic aneurysm. There is no gastrohepatic or hepatoduodenal ligament lymphadenopathy. No intraperitoneal or retroperitoneal lymphadenopathy. Reproductive: Uterus is markedly enlarged, measuring 16.7 x 9.8 x 14.1 cm. Multiple uterine fibroids are evident. There is no axillary lymphadenopathy. Other: No intraperitoneal free fluid. Musculoskeletal: Hemorrhage and edema is identified in the left groin. 4.1 x 2.3 x 2.4 cm area of relatively confluent soft tissue attenuation in the left groin may represent a hematoma or the patient's reported known pseudoaneurysm. There is no extension of the hemorrhage/edema up into the left pelvic sidewall or retroperitoneal space of the abdomen. No worrisome lytic or sclerotic osseous abnormality. IMPRESSION: 1. Edema/hemorrhage identified in the left groin region. 4.1 x 2.3 x 2.4 cm area of relatively confluent soft tissue attenuation in the left groin may represent a hematoma or the patient's reported known pseudoaneurysm. There is no extension of the hemorrhage/edema up into the left pelvic sidewall or retroperitoneal space. 2. Markedly enlarged uterus with uterine fibroids. Electronically Signed   By: Misty Stanley M.D.   On: 05/11/2018 13:27   Vas [place] Groin Pseudoaneurysm  Result Date: 05/11/2018  ARTERIAL PSEUDOANEURYSM  Exam: Left groin Performing Technologist: Abram Sander RVS Supporting Technologist: Oda Cogan RDMS, RVT  Examination Guidelines: A complete evaluation includes B-mode imaging, spectral Doppler, color Doppler, and power Doppler as needed of all accessible portions of each vessel. Bilateral testing is considered an integral part of a  complete examination. Limited examinations for reoccurring indications may be performed as noted. +-----------+----------+---------+------+----------+ Left DuplexPSV (cm/s)Waveform PlaqueComment(s) +-----------+----------+---------+------+----------+ CFA           102    triphasic                 +-----------+----------+---------+------+----------+ Left Vein comments:  Findings: An area with well defined borders measuring 2.2 cm x 2.7 cm was visualized arising off of the CFA with ultrasound characteristics of a pseudoaneurysm. The neck measures approximately 0.7 cm wide and 1.1 cm long.  Diagnosing physician: Servando Snare MD Electronically signed by Servando Snare MD on 05/11/2018 at 2:42:17 PM.   --------------------------------------------------------------------------------    Final    Telemetry    05/12/18 NSR HR 100's - Personally Reviewed  ECG    No new tracing as of 05/12/18- Personally Reviewed  Cardiac Studies   PV angiogram/intervention:  Final Impression:Successful penumbra aspiration thrombectomy, PTA and self-expanding stenting using overlapping [place]ris self-expanding stents of a long thrombotic occlusion of the popliteal, anterior tibial and tibioperoneal trunk. Etiology of this is unclear. It could potentially have been thromboembolic. The patient does have a palpable pulse on either level of the foot at the end of the case. She will need to be on dual antiplatelet therapy  including aspirin and Plavix. Heparin be restarted 4 hours after sheath removal without a bolus. Ultimately, she will need to be started on a novel oral anticoagulant such as Eliquis. She left the lab in stable condition. Because of the prolonged fluoroscopy time she will need to be followed carefully as an outpatient.  Patient Profile     53 y.o. female history of hypertension, chronic chest pain with recent negative Myoview and negative coronary CT presented for PV angiogram for claudication  symptoms.  Assessment & Plan    1.  Peripheral artery disease with acute anemia/groin hematoma with pseudoneurysm: -S/p successful penumbra aspiration thrombectomy, PTA and self-expanding stenting using overlapping [place] self-expanding stents of a long thrombotic occlusion of the popliteal, anterior tibial and tibioperoneal trunk. -She had left groin hematoma with suspected pseudoaneurysm in which a noncontrast CT of the abdomen and pelvis was negative for retroperitoneal hemorrhage. Left groin ultrasound also performed which showed a 2 x 2 and half centimeter pseudoaneurysm off of the left common femoral artery.  -Plan is for compression with [facility] today>>however pt is acitively refusing therapy  -Recommendations for holding Eliquis for 24 to 48 hours>>per [facility] note after Eliquis has been held>>start with compression and if she cannot tolerate this we will be available for thrombin injection either this weekend or early next week. -Hb today, stable at 9.4 -MD to follow with further recommendations   Signed, Kathyrn Drown NP-C [place] Pager: [phone] 05/12/2018, 7:55 AM     For questions or updates, please contact   Please consult [email or web address] for contact info under Cardiology/STEMI.  Patient seen, examined. Available data reviewed. Agree with findings, assessment, and plan as outlined by Kathyrn Drown, NP-C.  Patient independently interviewed and examined.  On my exam, she is alert, oriented, in no distress.  Lung fields are clear, heart is regular rate and rhythm with no murmur gallop, left groin hematoma is stable with no further extension in the last 24 hours.  There is no pretibial edema.  Appreciate vascular surgery evaluation.  The patient's last dose of apixaban was yesterday morning.  We will continue to hold.  I discussed potential treatment options with her, including ultrasound guided compression and percutaneous thrombin injection.  Discussed with Dr. Loletta Specter.  Advised her  that Dr. Loletta Specter would provide recommendations.  I think she will be agreeable to whichever treatment he feels is best.  Sherren Mocha, M.D. 05/12/2018 10:36 AM

## 2018-05-12 NOTE — Progress Notes (Signed)
Plan was to hold Eliquis and try ultrasound-guided compression of her left groin pseudoaneurysm.  Ultimately patient refused compression today and states it is too painful.  Given that the patient has a fairly long neck on her pseudoaneurysm we have recommended thrombin injection in the left femoral pseudoaneurysm.  Discussed with patient risks and benefits including failure of injection to work as well as risk for thromboembolism and/or limb ischemia.  Patient is amendable to injection today.  We will order thrombin to the bedside as well as local anesthesia.  We will get a consent form.  We will coordinate with PV lab for ultrasound-guided thrombin injection at the bedside.  Marty Heck, MD [facility] Office: [phone] Pager: [place]

## 2018-05-13 ENCOUNTER — Encounter (HOSPITAL_COMMUNITY): Payer: Self-pay | Admitting: Physician Assistant

## 2018-05-13 ENCOUNTER — Inpatient Hospital Stay (HOSPITAL_COMMUNITY): Payer: BLUE CROSS/BLUE SHIELD

## 2018-05-13 DIAGNOSIS — T81718A Complication of other artery following a procedure, not elsewhere classified, initial encounter: Secondary | ICD-10-CM | POA: Diagnosis present

## 2018-05-13 DIAGNOSIS — I729 Aneurysm of unspecified site: Secondary | ICD-10-CM | POA: Diagnosis present

## 2018-05-13 DIAGNOSIS — Z9889 Other specified postprocedural states: Secondary | ICD-10-CM

## 2018-05-13 DIAGNOSIS — D62 Acute posthemorrhagic anemia: Secondary | ICD-10-CM | POA: Diagnosis present

## 2018-05-13 LAB — CBC
HCT: 29.6 % — ABNORMAL LOW (ref 36.0–46.0)
Hemoglobin: 9.7 g/dL — ABNORMAL LOW (ref 12.0–15.0)
MCH: 31.5 pg (ref 26.0–34.0)
MCHC: 32.8 g/dL (ref 30.0–36.0)
MCV: 96.1 fL (ref 80.0–100.0)
Platelets: 244 10*3/uL (ref 150–400)
RBC: 3.08 MIL/uL — ABNORMAL LOW (ref 3.87–5.11)
RDW: 14 % (ref 11.5–15.5)
WBC: 9.8 10*3/uL (ref 4.0–10.5)
nRBC: 0 % (ref 0.0–0.2)

## 2018-05-13 MED ORDER — FERROUS GLUCONATE 324 (38 FE) MG PO TABS: 324.0000 mg | ORAL_TABLET | Freq: Every day | ORAL | 1 refills | Status: DC

## 2018-05-13 MED ORDER — LOSARTAN POTASSIUM 50 MG PO TABS: 50.0000 mg | ORAL_TABLET | Freq: Every day | ORAL | Status: DC

## 2018-05-13 MED ORDER — ASPIRIN 81 MG PO TBEC: 81.0000 mg | DELAYED_RELEASE_TABLET | Freq: Every day | ORAL | 0 refills | Status: DC

## 2018-05-13 MED ORDER — LOSARTAN POTASSIUM 100 MG PO TABS: 50.0000 mg | ORAL_TABLET | Freq: Every day | ORAL | Status: DC

## 2018-05-13 MED ORDER — ATORVASTATIN CALCIUM 80 MG PO TABS: 80.0000 mg | ORAL_TABLET | Freq: Every evening | ORAL | 6 refills | Status: DC

## 2018-05-13 MED ORDER — APIXABAN 5 MG PO TABS: 5.0000 mg | ORAL_TABLET | Freq: Two times a day (BID) | ORAL | 6 refills | Status: DC

## 2018-05-13 MED ORDER — CLOPIDOGREL BISULFATE 75 MG PO TABS: 75.0000 mg | ORAL_TABLET | Freq: Every day | ORAL | 0 refills | Status: DC

## 2018-05-13 NOTE — Discharge Summary (Signed)
Discharge Summary    Patient ID: Christina [name],  MRN: [id number], DOB/AGE: [birth year] 55 y.o.  Admit date: 05/08/2018 Discharge date: 05/13/2018  Primary Care Provider: Darreld Mclean Primary Cardiologist: Mertie Moores, MD Primary Electrophysiologist:  None  Discharge Diagnoses    Principal Problem:   Critical limb ischemia with history of revascularization of same extremity Active Problems:   Obesity   Essential hypertension   Peripheral arterial disease ([place])   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[place])   Acute blood loss as cause of postoperative anemia    Diagnostic Studies/Procedures    PV Angio 05/08/2018  History obtained from chart review.[name] a 53 y.o.severely overweight single African-American female mother of 1 child, grandmother and 3 grandchildren who works Chief Operating Officer at [employer]. She was referred by Dr. Acie Fredrickson for peripheral vascular evaluation because of fairly recent onset right calf claudication which is lifestyle limiting. She has a history of hypertension. She has chronic chest pain with a recent negative Myoview and a negative coronary CTA 1 year ago. She had fairly recent onset right calf pain approxi-1 month ago that occurred when she woke up 1 morning and has been fairly persistent with ambulation. She had lower extremity arterial Doppler studies performed 04/28/2018 revealing a right ABI 0.61 with an occluded right popliteal artery.  She presents today for peripheral angiography and intervention for rest pain  Pre Procedure Diagnosis: Peripheral arterial disease  Post Procedure Diagnosis: Peripheral arterial disease  Operators: Dr. Quay Burow  Procedures Performed:               1.  Ultrasound-guided right common femoral access               2.  Abdominal aortogram               3.  Contralateral access (second order catheter placement))               4.  Right lower extremity angiogram               5.  Penumbra  aspiration thrombectomy right popliteal artery, anterior tibial artery, tibioperoneal trunk and posterior tibial artery               6.  Chocolate balloon angioplasty right popliteal artery and anterior tibial artery               7.  Tigris stenting right popliteal artery and anterior tibial artery  PROCEDURE DESCRIPTION:   The patient was brought to the second floor [facility] cath lab in the the postabsorptive state. She was premedicated with IV Versed and fentanyl. Her left groin was prepped and shaved in usual sterile fashion. Xylocaine 1% was used for local anesthesia. A 5 French sheath was inserted into the common femoral artery using standard Seldinger technique using ultrasound guidance.  A 5 French pigtail catheter was placed in the distal abdominal aorta.  Distal abdominal aortography, bilateral iliac angiography was performed.  Contralateral access was obtained with a crossover catheter, 035 versa core wire and endhole catheter.  Right lower extremity angiography with runoff was performed using bolus chase, digital subtraction and step table technique.  On the pigtail was used for the entirety of the case.  Retrograde aortic pressure was monitored during the case.   Angiographic Data:   1: Abdominal aorta- widely patent 2: Right lower extremity- the right popliteal artery was occluded at the level of the knee down to the anterior tibial tibioperoneal trunk  and posterior tibial artery.  IMPRESSION: [name] has what appears to be a subacute thrombotic occlusion of her popliteal artery with ongoing severe lifestyle limiting claudication.  We will proceed with penumbra aspiration thrombectomy, PTA plus or minus stenting.  Procedure Description: The patient received 26 units of heparin with an ACT of 285.  A total of 205 cc of contrast was administered to the patient with a sedation time of 183 minutes.  Fluoroscopy time was 60.5 minutes.  I placed a 6 French 65 cm  destination sheath into the mid right SFA over the iliac bifurcation.  Using 135 cm Nava cross endhole catheter along with a stiff angled Glidewire 0.35, I was able to cross this CTO with mild difficulty.  The wire went into the anterior tibial artery.  Then exchanged for a [place] core wire and performed penumbra aspiration thrombectomy with CAT 6 catheter removing a significant amount of thrombotic material.  Following this I performed balloon angioplasty with a 5 mm x 80 mm long chocolate balloon in the popliteal artery.  Angiography revealed a significant amount of residual thrombus.  Multiple passes were then performed with the penumbra device in the popliteal artery into the anterior tibial artery.  Following this I placed a Tigris 5 x 80 self-expanding stent in the popliteal artery and postdilated with 5 mm x 4 cm balloons.  There is a significant bout of thrombus at the bifurcation of the anterior tibial and tibioperoneal trunk which could not be aspirated with penumbra of both the anterior tibial and the tibioperoneal trunk.  I did place a 5 mm spider distal protection device in the posterior tibial artery and attempted to capture the thrombus in the penumbra device unsuccessfully.  Following this I placed a 5 mm x 60 mm Tigris in the anterior tibial back into the below-knee popliteal artery overlapping the previous stent.  Unfortunately, the tibioperoneal trunk occluded during this procedure although the patient did have excellent inline flow to the foot via the anterior tibial artery with a 2+ pedal pulse at the end of the case.  The 65 cm destination sheath was then withdrawn over the bifurcation and exchanged over an 035 wire for a short 6 French sheath which was then secured.  The patient received 300 mg of p.o. Plavix.  Final Impression: Successful penumbra aspiration thrombectomy, PTA and self-expanding stenting using overlapping Tigris self-expanding stents of a long thrombotic occlusion of the  popliteal, anterior tibial and tibioperoneal trunk.  Etiology of this is unclear.  It could potentially have been thromboembolic.  The patient does have a palpable pulse on either level of the foot at the end of the case.  She will need to be on dual antiplatelet therapy including aspirin and Plavix.  Heparin be restarted 4 hours after sheath removal without a bolus.  Ultimately, she will need to be started on a novel oral anticoagulant such as Eliquis.  She left the lab in stable condition.  Because of the prolonged fluoroscopy time she will need to be followed carefully as an outpatient.   Quay Burow. MD, [facility] 05/08/2018 4:45 PM   LE arterial duplex studies this admission - see below for summary, and EMR for full reports. She is s/p thrombin injection.   _____________     History of Present Illness     Otha Rickles is a 53 y.o. female with history of HTN, negative coronary CT for CAD, morbid obesity, and recent suspicion of PAD who presented to [facility] for  planned PV angio. She had new onset right calf pain with ambulation approximately 1 month ago. This was fairly rapid in onset when she woke up one morning. Recent lower extremity arterial Doppler studies performed in our office 04/28/2018 revealed a right ABI 0.61 occluded right popliteal artery. Dr. Gwenlyn Found was concerned this may have been a thromboembolic phenomenon. She was on aspirin. Dr. Gwenlyn Found recommended PV angio for further evaluation.  Hospital Course    1. PAD - She was brought in for this procedure 05/08/2018 which demonstrated subacute thrombotic occlusion of her popliteal artery. She underwent successful penumbra aspiration thrombectomy, PTA and self-expanding stenting using overlapping Tigris self-expanding stents of a long thrombotic occlusion of the popliteal, anterior tibial and tibioperoneal trunk.  Etiology of this was unclear. It potentially could have been thromboembolic. Dr. Gwenlyn Found recommended initiation of  ASA and Plavix as well as heparin->Eliquis. She did have prolonged fluoroscopy time and will need to be followed carefully as an outpatient. Post procedurally she developed ABL anemia. This was initially felt to be expected given prolonged procedure,  But she developed left groin hematoma and was observed closely. Arterial duplex on 05/11/18 confirmed a pseudoaneurysm - 2.7 cm pseudoaneurysm with at least a 1 cm neck. CT scan was negative for retroperitoneal hemorrhage. Eliquis was held. Vascular surgery was consulted. She did not wish to proceed with compression so thrombin injection was pursued on 05/12/18. F/u duplex this morning confirmed the pseudoaneurysm remains closed. Per rounding note from Dr. Angelena Form he had discussed case with Dr. Gwenlyn Found who recommended ASA and Plavix x 1 month then stop, and continue Eliquis 5mg  BID. Her last doses of ASA and Plavix will be 06/07/18. Bleeding precautions reviewed with patient. Care management consulted for Eliquis prior to DC; 30 day free RX card given.  2. HTN, with hypotension during admission on home regimen: She has been intermittently hypotensive therefore Dr. Percival Spanish recommends holding her HCTZ and carvedilol for now, and sending her home on 1/2 dose losartan. She wants to try and cut these in half and will let us know if she cannot. A refill should be sent in of the lower dose if we are to continue lower dose as outpatient.  3. ABL anemia - started on iron this admission, given #30 with 1 refill. DC Hgb stable at 9.7 (preop value 13.6).  4. Goal LDL <70 given PAD, previously 99 - started on statin this admission.  If the patient is tolerating statin at time of follow-up appointment, would consider rechecking liver function/lipid panel in 6-8 weeks.  5. Prolonged fluoroscopy - was given prolonged fluoro instructions to monitor for skin changes - needs documentation of derm assessment at follow-up assessment.  Dr. Percival Spanish recommends she be worked in early to  the office on Tuesday or Wednesday for post-hospital follow-up. I have sent a message to our office's scheduling team requesting a follow-up appointment, and our office will call the patient with this information. Dr. Percival Spanish recommends a CBC at that visit. He does not feel she would require repeat arterial duplex to reassess pseudo but to follow clinically. She was started on statin this admission. It does appear she already has f/u lower extremity arterial duplex studies and appointment with Dr. Gwenlyn Found later this month as well and these appointments should be reiterated to her at time of follow-up if clinically stable. Dr. Percival Spanish has seen and examined the patient today and feels she is stable for discharge. She was asked to remain out of work and limit driving/lifting until  seen in follow-up.  She was given the following instructions on AVS: You will complete 1 month of aspirin and clopidogrel (Plavix). Your last dose of these medicines will be 06/07/18.   You will start Eliquis tomorrow - this may be a long term medicine.  You were started on atorvastatin to prevent progression of plaque buildup in the future.  You were started on an iron supplement to build your stores back up. You should discuss duration of treatment with Dr. Gwenlyn Found when you see him back.  Your carvedilol and hydrochlorothiazide are being stopped for now due to low blood pressure. Do not throw these medicines away as we might restart them in the future. Please cut the losartan tablets in half for now. If you find you can't cut them, call our office.  Hyzaar (combo pill) and cilostazol were removed from your medicine list as you indicated you are not taking these.  Your heart catheterization required "fluoroscopy," a type of x-ray that allowed your heart doctor to fully evaluate your heart arteries. Although very uncommon, the radiation used to "take pictures" can cause skin changes over time if you required a prolonged procedure.  Please monitor the skin on your chest and back over the next several months for any reddening, peeling, or changes in appearance. Please notify your doctor if you experience this.   No driving, lifting over 5lbs or sexual activity until cleared by your cardiology provider. You may not return to work until cleared at follow-up visit. Keep procedure site clean & dry. If you notice increased pain, swelling, bleeding or pus, call/return!  You may shower, but no soaking baths/hot tubs/pools for 1 week.   If you notice any bleeding such as blood in stool, black tarry stools, blood in urine, nosebleeds or any other unusual bleeding, call your doctor immediately. It is not normal to have this kind of bleeding while on a blood thinner and usually indicates there is an underlying problem with one of your body systems that needs to be checked out.    _____________  Discharge Vitals Blood pressure (!) 97/57, pulse (!) 115, temperature 99 F (37.2 C), temperature source Oral, resp. rate 19, height 5\' 7"  (1.702 m), weight 123.9 kg, SpO2 98 %.  Filed Weights   05/11/18 0628 05/12/18 0500 05/13/18 0507  Weight: 125.3 kg 124.3 kg 123.9 kg    Labs & Radiologic Studies    CBC Recent Labs    05/12/18 0503 05/12/18 2016 05/13/18 0336  WBC 9.7  --  9.8  HGB 9.4* 9.8* 9.7*  HCT 29.4* 30.3* 29.6*  MCV 94.8  --  96.1  PLT 222  --  244   ____________  Ct Abdomen Pelvis Wo Contrast  Result Date: 05/11/2018 CLINICAL DATA:  Left groin pain. Hematoma after angiography yesterday. Positive left groin pseudo aneurysm. EXAM: CT ABDOMEN AND PELVIS WITHOUT CONTRAST TECHNIQUE: Multidetector CT imaging of the abdomen and pelvis was performed following the standard protocol without IV contrast. COMPARISON:  None. FINDINGS: Lower chest: Unremarkable Hepatobiliary: No focal abnormality in the liver on this study without intravenous contrast. There is no evidence for gallstones, gallbladder wall thickening, or pericholecystic  fluid. No intrahepatic or extrahepatic biliary dilation. Pancreas: No focal mass lesion. No dilatation of the main duct. No intraparenchymal cyst. No peripancreatic edema. Spleen: No splenomegaly. No focal mass lesion. Adrenals/Urinary Tract: No adrenal nodule or mass. Scarring noted in the left kidney. Right kidney unremarkable on this noncontrast exam. No evidence for hydroureter. The urinary bladder appears  normal for the degree of distention. Stomach/Bowel: Stomach is nondistended. No gastric wall thickening. No evidence of outlet obstruction. Duodenum is normally positioned as is the ligament of Treitz. No small bowel wall thickening. No small bowel dilatation. The terminal ileum is normal. The appendix is normal. No gross colonic mass. No colonic wall thickening. Vascular/Lymphatic: No abdominal aortic aneurysm. There is no gastrohepatic or hepatoduodenal ligament lymphadenopathy. No intraperitoneal or retroperitoneal lymphadenopathy. Reproductive: Uterus is markedly enlarged, measuring 16.7 x 9.8 x 14.1 cm. Multiple uterine fibroids are evident. There is no axillary lymphadenopathy. Other: No intraperitoneal free fluid. Musculoskeletal: Hemorrhage and edema is identified in the left groin. 4.1 x 2.3 x 2.4 cm area of relatively confluent soft tissue attenuation in the left groin may represent a hematoma or the patient's reported known pseudoaneurysm. There is no extension of the hemorrhage/edema up into the left pelvic sidewall or retroperitoneal space of the abdomen. No worrisome lytic or sclerotic osseous abnormality. IMPRESSION: 1. Edema/hemorrhage identified in the left groin region. 4.1 x 2.3 x 2.4 cm area of relatively confluent soft tissue attenuation in the left groin may represent a hematoma or the patient's reported known pseudoaneurysm. There is no extension of the hemorrhage/edema up into the left pelvic sidewall or retroperitoneal space. 2. Markedly enlarged uterus with uterine fibroids.  Electronically Signed   By: Misty Stanley M.D.   On: 05/11/2018 13:27   Vas [place] Groin Pseudoaneurysm  Result Date: 05/11/2018  ARTERIAL PSEUDOANEURYSM  Exam: Left groin Performing Technologist: Abram Sander RVS Supporting Technologist: Oda Cogan RDMS, RVT  Examination Guidelines: A complete evaluation includes B-mode imaging, spectral Doppler, color Doppler, and power Doppler as needed of all accessible portions of each vessel. Bilateral testing is considered an integral part of a complete examination. Limited examinations for reoccurring indications may be performed as noted. +-----------+----------+---------+------+----------+ Left DuplexPSV (cm/s)Waveform PlaqueComment(s) +-----------+----------+---------+------+----------+ CFA           102    triphasic                 +-----------+----------+---------+------+----------+ Left Vein comments:  Findings: An area with well defined borders measuring 2.2 cm x 2.7 cm was visualized arising off of the CFA with ultrasound characteristics of a pseudoaneurysm. The neck measures approximately 0.7 cm wide and 1.1 cm long.  Diagnosing physician: Servando Snare MD Electronically signed by Servando Snare MD on 05/11/2018 at 2:42:17 PM.   --------------------------------------------------------------------------------    Final    Vas [place] Lower Ext Arterial Pseudo Injection  Result Date: 05/12/2018  ARTERIAL PSEUDOANEURYSM  Exam: Left groin Performing Technologist: Abram Sander RVS Supporting Technologist: Oda Cogan RDMS, RVT  Examination Guidelines: A complete evaluation includes B-mode imaging, spectral Doppler, color Doppler, and power Doppler as needed of all accessible portions of each vessel. Bilateral testing is considered an integral part of a complete examination. Limited examinations for reoccurring indications may be performed as noted. +------------+----------+---------+------+----------+ Right DuplexPSV (cm/s)Waveform PlaqueComment(s)  +------------+----------+---------+------+----------+ CFA             78    triphasic                 +------------+----------+---------+------+----------+  Summary: Succesful left lower extremity groin psueoaneurysm injection preformed by Dr. Carlis Abbott. Diagnosing physician: Monica Martinez MD Electronically signed by Monica Martinez MD on 05/12/2018 at 5:44:32 PM.   --------------------------------------------------------------------------------    Final    Vas [place] Lower Extremity Arterial Duplex  Result Date: 05/13/2018 LOWER EXTREMITY ARTERIAL DUPLEX STUDY Indications: Follow-up post Thrombin injection of pseudoaneurysm.  Current ABI: not  applicable Performing Technologist: Sharion Dove RVS  Examination Guidelines: A complete evaluation includes B-mode imaging, spectral Doppler, color Doppler, and power Doppler as needed of all accessible portions of each vessel. Bilateral testing is considered an integral part of a complete examination. Limited examinations for reoccurring indications may be performed as noted.  Summary: Left: Pseudonaneurysm remains closed.  See table(s) above for measurements and observations. Electronically signed by Monica Martinez MD on 05/13/2018 at 10:21:20 AM.    Final    Vas [place] Lower Extremity Arterial Duplex  Result Date: 04/28/2018 LOWER EXTREMITY ARTERIAL DUPLEX STUDY Indications: Claudication, and Patient reports pain in her right leg with              walking for about 3 weeks now. High Risk Factors: Hypertension, no history of smoking.  Current ABI: ABI's performed at [facility] on 04/12/18 were 0.61 on              the right and 1.10 on the left. Limitations: Patient body habitus Performing Technologist: Chesley Noon RVT  Examination Guidelines: A complete evaluation includes B-mode imaging, spectral Doppler, color Doppler, and power Doppler as needed of all accessible portions of each vessel. Bilateral testing is considered an integral part of a  complete examination. Limited examinations for reoccurring indications may be performed as noted.  Right Duplex Findings: +-----------+--------+-----+---------------+----------------+--------+            PSV cm/sRatioStenosis       Waveform        Comments +-----------+--------+-----+---------------+----------------+--------+ CFA Prox   161          30-49% stenosistriphasic                +-----------+--------+-----+---------------+----------------+--------+ DFA        60                          biphasic                 +-----------+--------+-----+---------------+----------------+--------+ SFA Prox   104                         triphasic                +-----------+--------+-----+---------------+----------------+--------+ SFA Mid    89                          triphasic                +-----------+--------+-----+---------------+----------------+--------+ SFA Distal 78                          triphasic                +-----------+--------+-----+---------------+----------------+--------+ POP Prox   132                         triphasic                +-----------+--------+-----+---------------+----------------+--------+ POP Mid    99                          monophasic               +-----------+--------+-----+---------------+----------------+--------+ POP Distal 0            occluded       no flow  detectedplaque   +-----------+--------+-----+---------------+----------------+--------+ ATA Prox   27                          monophasic               +-----------+--------+-----+---------------+----------------+--------+ ATA Mid    26                          monophasic               +-----------+--------+-----+---------------+----------------+--------+ ATA Distal 29                          monophasic               +-----------+--------+-----+---------------+----------------+--------+ PTA Prox   18                           monophasic               +-----------+--------+-----+---------------+----------------+--------+ PTA Mid    19                          monophasic               +-----------+--------+-----+---------------+----------------+--------+ PTA Distal 31                          monophasic               +-----------+--------+-----+---------------+----------------+--------+ PERO Prox  35                          monophasic               +-----------+--------+-----+---------------+----------------+--------+ PERO Mid   25                          monophasic               +-----------+--------+-----+---------------+----------------+--------+ PERO Distal                            monophasic               +-----------+--------+-----+---------------+----------------+--------+ The right tibioperoneal trunk appears to be occluded, though this area is not well visualized.  Left Duplex Findings: +----------+--------+-----+--------+---------+--------+           PSV cm/sRatioStenosisWaveform Comments +----------+--------+-----+--------+---------+--------+ CFA Prox  141                  triphasic         +----------+--------+-----+--------+---------+--------+ DFA       111                  triphasic         +----------+--------+-----+--------+---------+--------+ SFA Prox  126                  triphasic         +----------+--------+-----+--------+---------+--------+ SFA Mid   87                   triphasic         +----------+--------+-----+--------+---------+--------+ SFA Distal105                  triphasic         +----------+--------+-----+--------+---------+--------+  POP Prox  58                   triphasic         +----------+--------+-----+--------+---------+--------+ The left tibioperoneal trunk is patent with PSV of 102 cm/sec and triphasic waveform.  Summary: Right: 30-49% stenosis of the common femoral artery. Patent superficial femoral  artery. Total occlusion noted in the popliteal artery. Suspect total occlusion of the tibioperoneal trunk. Tibial vessels appear patent with monophasic flow. Left: Normal examination. No evidence of arterial occlusive disease.  See table(s) above for measurements and observations. Vascular consult recommended. Patient seeing Dr. Gwenlyn Found today at 3:15 pm. Electronically signed by Carlyle Dolly MD on 04/28/2018 at 4:23:56 PM.    Final    Vas [place] Lower Extremity Venous (dvt)  Result Date: 04/14/2018  Lower Venous Study Indications: Pain x three weeks with walking, right leg worse than the left leg, and swelling.  Risk Factors: None identified. Limitations: Body habitus. Performing Technologist: Guinevere Ferrari RVT Supporting Technologist: [facility] BS, RVT, RDCS  Examination Guidelines: A complete evaluation includes B-mode imaging, spectral Doppler, color Doppler, and power Doppler as needed of all accessible portions of each vessel. Bilateral testing is considered an integral part of a complete examination. Limited examinations for reoccurring indications may be performed as noted.  Right Venous Findings: +---------+---------------+---------+-----------+----------+-------------+          CompressibilityPhasicitySpontaneityPropertiesSummary       +---------+---------------+---------+-----------+----------+-------------+ CFV      Full           Yes      Yes                  Rouleaux flow +---------+---------------+---------+-----------+----------+-------------+ SFJ      Full           Yes      Yes                  Rouleaux flow +---------+---------------+---------+-----------+----------+-------------+ FV Prox  Full           Yes      Yes                                +---------+---------------+---------+-----------+----------+-------------+ FV Mid   Full                                                        +---------+---------------+---------+-----------+----------+-------------+ FV DistalFull           Yes      Yes                                +---------+---------------+---------+-----------+----------+-------------+ PFV      Full                                                       +---------+---------------+---------+-----------+----------+-------------+ POP      Full           Yes      Yes  Rouleaux flow +---------+---------------+---------+-----------+----------+-------------+ PTV      Full           Yes      Yes                                +---------+---------------+---------+-----------+----------+-------------+ PERO     Full           Yes      Yes                                +---------+---------------+---------+-----------+----------+-------------+ GSV      Full           Yes      Yes                  Rouleaux flow +---------+---------------+---------+-----------+----------+-------------+  Right Technical Findings: Not adequately visualized segments include calf veins.  Left Venous Findings: +---------+---------------+---------+-----------+----------+-------+          CompressibilityPhasicitySpontaneityPropertiesSummary +---------+---------------+---------+-----------+----------+-------+ CFV      Full           Yes      Yes                          +---------+---------------+---------+-----------+----------+-------+ SFJ      Full           Yes      Yes                          +---------+---------------+---------+-----------+----------+-------+ FV Prox  Full           Yes      Yes                          +---------+---------------+---------+-----------+----------+-------+ FV Mid   Full                                                 +---------+---------------+---------+-----------+----------+-------+ FV DistalFull           Yes      Yes                           +---------+---------------+---------+-----------+----------+-------+ PFV      Full                                                 +---------+---------------+---------+-----------+----------+-------+ POP      Full           Yes      Yes                          +---------+---------------+---------+-----------+----------+-------+ PTV      Full           Yes      Yes                          +---------+---------------+---------+-----------+----------+-------+ PERO     Full  Yes      Yes                          +---------+---------------+---------+-----------+----------+-------+ Gastroc  Full                                                 +---------+---------------+---------+-----------+----------+-------+ GSV      Full           Yes      Yes                          +---------+---------------+---------+-----------+----------+-------+  Left Technical Findings: Not adequately visualized segments include distal femoral and calf veins.  Findings reported to Dr. Acie Fredrickson at 3:40.  Summary: Right: No evidence of deep vein thrombosis in the lower extremity. No indirect evidence of obstruction proximal to the inguinal ligament. No cystic structure found in the popliteal fossa. Left: No evidence of deep vein thrombosis in the lower extremity. No indirect evidence of obstruction proximal to the inguinal ligament. No cystic structure found in the popliteal fossa.  *See table(s) above for measurements and observations. Suggest follow up study in Suggest follow up Lower Extremity Venous Duplex. Electronically signed by Kathlyn Sacramento MD on 04/14/2018 at 4:41:52 PM.    Final    Disposition   Pt is being discharged home today in good condition.  Follow-up Plans & Appointments    Follow-up Information    [facility]. Go on 05/26/2018.   Specialty:  Cardiology Why:  @8am  for PV procedure Contact information: [address] [place] [phone]       Lorretta Harp, MD. Go on 05/31/2018.   Specialties:  Cardiology, Radiology Why:  @3 :45pm for follow up  Contact information: [address] [place] [address] [phone]        [facility] Follow up.   Why:  THE OFFICE WILL CALL YOU FOR A FOLLOW-UP VISIT ON TUESDAY OR WEDNESDAY OF THIS WEEK. CALL THE OFFICE ON MONDAY IF YOU HAVEN'T HEARD BACK BY 2PM.         Discharge Instructions    Diet - low sodium heart healthy   Complete by:  As directed    Discharge instructions   Complete by:  As directed    You will complete 1 month of aspirin and clopidogrel (Plavix). Your last dose of these medicines will be 06/07/18.   You will start Eliquis tomorrow - this may be a long term medicine.  You were started on atorvastatin to prevent progression of plaque buildup in the future.  You were started on an iron supplement to build your stores back up. You should discuss duration of treatment with Dr. Gwenlyn Found when you see him back.  Your carvedilol and hydrochlorothiazide are being stopped for now due to low blood pressure. Do not throw these medicines away as we might restart them in the future. Please cut the losartan tablets in half for now. If you find you can't cut them, call our office.  Hyzaar (combo pill) and cilostazol were removed from your medicine list as you indicated you are not taking these.  Your heart catheterization required "fluoroscopy," a type of x-ray that allowed your heart doctor to fully evaluate your heart arteries. Although very uncommon,  the radiation used to "take pictures" can cause skin changes over time if you required a prolonged procedure. Please monitor the skin on your chest and back over the next several months for any reddening, peeling, or changes in appearance. Please notify your doctor if you experience this.   No driving, lifting over 5lbs or sexual activity until cleared by your cardiology  provider. You may not return to work until cleared at follow-up visit. Keep procedure site clean & dry. If you notice increased pain, swelling, bleeding or pus, call/return!  You may shower, but no soaking baths/hot tubs/pools for 1 week.   If you notice any bleeding such as blood in stool, black tarry stools, blood in urine, nosebleeds or any other unusual bleeding, call your doctor immediately. It is not normal to have this kind of bleeding while on a blood thinner and usually indicates there is an underlying problem with one of your body systems that needs to be checked out.   Increase activity slowly   Complete by:  As directed       Discharge Medications   Allergies as of 05/13/2018      Reactions   Lisinopril Cough      Medication List    STOP taking these medications   carvedilol 6.25 MG tablet Commonly known as:  COREG   cilostazol 100 MG tablet Commonly known as:  PLETAL   hydrochlorothiazide 25 MG tablet Commonly known as:  HYDRODIURIL   losartan-hydrochlorothiazide 100-25 MG tablet Commonly known as:  HYZAAR     TAKE these medications   apixaban 5 MG Tabs tablet Commonly known as:  ELIQUIS Take 1 tablet (5 mg total) by mouth 2 (two) times daily. Start taking on:  May 14, 2018   aspirin 81 MG EC tablet Take 1 tablet (81 mg total) by mouth daily. For 1 month total post-stent. Your last dose will be 06/07/2018. Start taking on:  May 14, 2018   atorvastatin 80 MG tablet Commonly known as:  LIPITOR Take 1 tablet (80 mg total) by mouth every evening.   clopidogrel 75 MG tablet Commonly known as:  PLAVIX Take 1 tablet (75 mg total) by mouth daily. For 1 month total post-stent. Your last dose will be 06/07/2018. Start taking on:  May 14, 2018   ferrous gluconate 324 MG tablet Commonly known as:  FERGON Take 1 tablet (324 mg total) by mouth daily with breakfast. Start taking on:  May 14, 2018   losartan 100 MG tablet Commonly known as:   COZAAR Take 0.5 tablets (50 mg total) by mouth daily. What changed:  how much to take   megestrol 40 MG tablet Commonly known as:  MEGACE Take 1 tablet (40 mg total) by mouth 2 (two) times daily. 40 mg daily. May take additional 40 mg in the event of heavy bleeding. What changed:    when to take this  additional instructions   potassium chloride 10 MEQ tablet Commonly known as:  K-DUR,KLOR-CON Take 1 tablet (10 mEq total) by mouth daily.        Allergies:  Allergies  Allergen Reactions  . Lisinopril Cough    Outstanding Labs/Studies   Needs CBC at f/u visit  Duration of Discharge Encounter   Greater than 30 minutes including physician time.  Signed, Charlie Pitter PA-C 05/13/2018, 12:52 PM

## 2018-05-13 NOTE — Progress Notes (Signed)
Progress Note  Patient Name: Christina [name] Date of Encounter: 05/13/2018  Primary Cardiologist:   Mertie Moores, MD   Subjective   No chest pain.  No SOB.  She has had no dizziness.    Inpatient Medications    Scheduled Meds: . aspirin EC  81 mg Oral Daily  . atorvastatin  80 mg Oral q1800  . carvedilol  6.25 mg Oral BID  . clopidogrel  75 mg Oral Daily  . ferrous gluconate  324 mg Oral Q breakfast  . hydrochlorothiazide  25 mg Oral Daily  . lidocaine-EPINEPHrine  10 mL Other Once  . losartan  100 mg Oral Daily  . potassium chloride  10 mEq Oral Daily  . sodium chloride flush  3 mL Intravenous Q12H  . thrombin 5,000 units for percutaneous treatment of pseudoaneurysm   Percutaneous Once   Continuous Infusions: . sodium chloride     PRN Meds: sodium chloride, acetaminophen, hydrALAZINE, labetalol, loperamide, morphine injection, ondansetron (ZOFRAN) IV, sodium chloride flush   Vital Signs    Vitals:   05/12/18 1700 05/12/18 1800 05/12/18 1941 05/13/18 0507  BP: 114/68 119/81 90/76 (!) 97/57  Pulse:   81 (!) 115  Resp:   15 19  Temp:   98.4 F (36.9 C) 99 F (37.2 C)  TempSrc:   Oral Oral  SpO2:   99% 98%  Weight:    123.9 kg  Height:        Intake/Output Summary (Last 24 hours) at 05/13/2018 1137 Last data filed at 05/13/2018 0800 Gross per 24 hour  Intake 0 ml  Output -  Net 0 ml   Filed Weights   05/11/18 0628 05/12/18 0500 05/13/18 0507  Weight: 125.3 kg 124.3 kg 123.9 kg    Telemetry    NSR - Personally Reviewed  ECG    NA - Personally Reviewed  Physical Exam   GEN: No acute distress.   Neck: No  JVD Cardiac: RRR, no murmurs, rubs, or gallops.  Respiratory: Clear  to auscultation bilaterally. GI: Soft, nontender, non-distended  MS: No  edema; No deformity.  Large left echymosis with small hematoma and tenderness.   Neuro:  Nonfocal  Psych: Normal affect   Labs    Chemistry Recent Labs  Lab 05/09/18 0416 05/10/18 0511  NA 138  137  K 3.7 3.6  CL 113* 110  CO2 18* 20*  GLUCOSE 104* 97  BUN 10 9  CREATININE 0.74 0.81  CALCIUM 8.7* 8.7*  GFRNONAA >60 >60  GFRAA >60 >60  ANIONGAP 7 7     Hematology Recent Labs  Lab 05/11/18 0328 05/12/18 0503 05/12/18 2016 05/13/18 0336  WBC 8.2 9.7  --  9.8  RBC 3.08* 3.10*  --  3.08*  HGB 9.3* 9.4* 9.8* 9.7*  HCT 29.1* 29.4* 30.3* 29.6*  MCV 94.5 94.8  --  96.1  MCH 30.2 30.3  --  31.5  MCHC 32.0 32.0  --  32.8  RDW 13.8 13.9  --  14.0  PLT 202 222  --  244    Cardiac EnzymesNo results for input(s): TROPONINI in the last 168 hours. No results for input(s): TROPIPOC in the last 168 hours.   BNPNo results for input(s): BNP, PROBNP in the last 168 hours.   DDimer No results for input(s): DDIMER in the last 168 hours.   Radiology    Ct Abdomen Pelvis Wo Contrast  Result Date: 05/11/2018 CLINICAL DATA:  Left groin pain. Hematoma after angiography yesterday. Positive left  groin pseudo aneurysm. EXAM: CT ABDOMEN AND PELVIS WITHOUT CONTRAST TECHNIQUE: Multidetector CT imaging of the abdomen and pelvis was performed following the standard protocol without IV contrast. COMPARISON:  None. FINDINGS: Lower chest: Unremarkable Hepatobiliary: No focal abnormality in the liver on this study without intravenous contrast. There is no evidence for gallstones, gallbladder wall thickening, or pericholecystic fluid. No intrahepatic or extrahepatic biliary dilation. Pancreas: No focal mass lesion. No dilatation of the main duct. No intraparenchymal cyst. No peripancreatic edema. Spleen: No splenomegaly. No focal mass lesion. Adrenals/Urinary Tract: No adrenal nodule or mass. Scarring noted in the left kidney. Right kidney unremarkable on this noncontrast exam. No evidence for hydroureter. The urinary bladder appears normal for the degree of distention. Stomach/Bowel: Stomach is nondistended. No gastric wall thickening. No evidence of outlet obstruction. Duodenum is normally positioned as is  the ligament of Treitz. No small bowel wall thickening. No small bowel dilatation. The terminal ileum is normal. The appendix is normal. No gross colonic mass. No colonic wall thickening. Vascular/Lymphatic: No abdominal aortic aneurysm. There is no gastrohepatic or hepatoduodenal ligament lymphadenopathy. No intraperitoneal or retroperitoneal lymphadenopathy. Reproductive: Uterus is markedly enlarged, measuring 16.7 x 9.8 x 14.1 cm. Multiple uterine fibroids are evident. There is no axillary lymphadenopathy. Other: No intraperitoneal free fluid. Musculoskeletal: Hemorrhage and edema is identified in the left groin. 4.1 x 2.3 x 2.4 cm area of relatively confluent soft tissue attenuation in the left groin may represent a hematoma or the patient's reported known pseudoaneurysm. There is no extension of the hemorrhage/edema up into the left pelvic sidewall or retroperitoneal space of the abdomen. No worrisome lytic or sclerotic osseous abnormality. IMPRESSION: 1. Edema/hemorrhage identified in the left groin region. 4.1 x 2.3 x 2.4 cm area of relatively confluent soft tissue attenuation in the left groin may represent a hematoma or the patient's reported known pseudoaneurysm. There is no extension of the hemorrhage/edema up into the left pelvic sidewall or retroperitoneal space. 2. Markedly enlarged uterus with uterine fibroids. Electronically Signed   By: Misty Stanley M.D.   On: 05/11/2018 13:27   Vas [place] Groin Pseudoaneurysm  Result Date: 05/11/2018  ARTERIAL PSEUDOANEURYSM  Exam: Left groin Performing Technologist: Abram Sander RVS Supporting Technologist: Oda Cogan RDMS, RVT  Examination Guidelines: A complete evaluation includes B-mode imaging, spectral Doppler, color Doppler, and power Doppler as needed of all accessible portions of each vessel. Bilateral testing is considered an integral part of a complete examination. Limited examinations for reoccurring indications may be performed as noted.  +-----------+----------+---------+------+----------+ Left DuplexPSV (cm/s)Waveform PlaqueComment(s) +-----------+----------+---------+------+----------+ CFA           102    triphasic                 +-----------+----------+---------+------+----------+ Left Vein comments:  Findings: An area with well defined borders measuring 2.2 cm x 2.7 cm was visualized arising off of the CFA with ultrasound characteristics of a pseudoaneurysm. The neck measures approximately 0.7 cm wide and 1.1 cm long.  Diagnosing physician: Servando Snare MD Electronically signed by Servando Snare MD on 05/11/2018 at 2:42:17 PM.   --------------------------------------------------------------------------------    Final    Vas [place] Lower Ext Arterial Pseudo Injection  Result Date: 05/12/2018  ARTERIAL PSEUDOANEURYSM  Exam: Left groin Performing Technologist: Abram Sander RVS Supporting Technologist: Oda Cogan RDMS, RVT  Examination Guidelines: A complete evaluation includes B-mode imaging, spectral Doppler, color Doppler, and power Doppler as needed of all accessible portions of each vessel. Bilateral testing is considered an integral part of a  complete examination. Limited examinations for reoccurring indications may be performed as noted. +------------+----------+---------+------+----------+ Right DuplexPSV (cm/s)Waveform PlaqueComment(s) +------------+----------+---------+------+----------+ CFA             78    triphasic                 +------------+----------+---------+------+----------+  Summary: Succesful left lower extremity groin psueoaneurysm injection preformed by Dr. Carlis Abbott. Diagnosing physician: Monica Martinez MD Electronically signed by Monica Martinez MD on 05/12/2018 at 5:44:32 PM.   --------------------------------------------------------------------------------    Final    Vas [place] Lower Extremity Arterial Duplex  Result Date: 05/13/2018 LOWER EXTREMITY ARTERIAL DUPLEX STUDY Indications:  Follow-up post Thrombin injection of pseudoaneurysm.  Current ABI: not applicable Performing Technologist: Sharion Dove RVS  Examination Guidelines: A complete evaluation includes B-mode imaging, spectral Doppler, color Doppler, and power Doppler as needed of all accessible portions of each vessel. Bilateral testing is considered an integral part of a complete examination. Limited examinations for reoccurring indications may be performed as noted.  Summary: Left: Pseudonaneurysm remains closed.  See table(s) above for measurements and observations. Electronically signed by Monica Martinez MD on 05/13/2018 at 10:21:20 AM.    Final     Cardiac Studies   PV angiogram/intervention:  Final Impression:Successful penumbra aspiration thrombectomy, PTA and self-expanding stenting using overlapping [place] self-expanding stents of a long thrombotic occlusion of the popliteal, anterior tibial and tibioperoneal trunk. Etiology of this is unclear. It could potentially have been thromboembolic. The patient does have a palpable pulse on either level of the foot at the end of the case. She will need to be on dual antiplatelet therapy including aspirin and Plavix. Heparin be restarted 4 hours after sheath removal without a bolus. Ultimately, she will need to be started on a novel oral anticoagulant such as Eliquis. She left the lab in stable condition. Because of the prolonged fluoroscopy time she will need to be followed carefully as an outpatient.  Patient Profile     53 y.o. female history of hypertension, chronic chest pain with recent negative Myoview and negative coronary CT presented for PV angiogram for claudication symptoms.  Assessment & Plan    PSEUDOANEURYSM:  Treated with thrombin injection.  This remained closed on follow up ultrasound this morning. We will follow up closely in the hospital  HYPOTENSION:  She is very worried about her dizziness and hypotension.  This was related to the  acute groin bleed.  I am going to only send her home with Cozaar 50 mg and no HCTZ and no beta blocker until she is seen in the office next week.  She will be out of work until that time.    PVD:  Successful aspiration thrombectomy and PTA with self-expanding overlapping stents for treatment of occlusion of the popliteal anterior tibial and tibioperoneal trunk.  Home on ASA and Plavix.  Start Eliquis tomorrow.  I sent a note to Dr. Gwenlyn Found to ask him to clarify the anticoagulation plan he would like to pursue.     For questions or updates, please contact [place] Please consult [email or web address] for contact info under Cardiology/STEMI.   Signed, Minus Breeding, MD  05/13/2018, 11:37 AM

## 2018-05-13 NOTE — Progress Notes (Signed)
VASCULAR LAB PRELIMINARY  PRELIMINARY  PRELIMINARY  PRELIMINARY  Left lower extremity pseudoaneurysm follow-up completed.    Preliminary report:  Pseudoaneurysm remains closed.  Ayinde Swim, RVT 05/13/2018, 9:06 AM

## 2018-05-13 NOTE — Progress Notes (Signed)
Patient ambulated 161ft in hall with RN. O2 sats were 100% on RA. BP after walk was 110/55 and pulse was 105. Patient denied any complaints other than being tired while walking. Stated that she wanted to go home.

## 2018-05-13 NOTE — Care Management (Signed)
Spoke w patient at bedside. She was provided with 30 day Eliquis card. Discussed patient assistance. She is call the number on the Eliquis brochure Monday. Also instructed her to contact Cards office for samples and to get assist w PA. She understands that meds work only on the day she takes it and she cannot let anticoag coverage lapse.

## 2018-05-13 NOTE — Progress Notes (Signed)
[facility]  Subjective  - States left groin pain somewhat improved today.   Objective (!) 97/57 (!) 115 99 F (37.2 C) (Oral) 19 98%  Intake/Output Summary (Last 24 hours) at 05/13/2018 0851 Last data filed at 05/13/2018 0800 Gross per 24 hour  Intake 240 ml  Output -  Net 240 ml    Left groin ecchymosis stable. Palpable left DP pulse  Laboratory Lab Results: Recent Labs    05/12/18 0503 05/12/18 2016 05/13/18 0336  WBC 9.7  --  9.8  HGB 9.4* 9.8* 9.7*  HCT 29.4* 30.3* 29.6*  PLT 222  --  244   BMET No results for input(s): NA, K, CL, CO2, GLUCOSE, BUN, CREATININE, CALCIUM in the last 72 hours.  COAG Lab Results  Component Value Date   INR 1.1 04/22/2017   No results found for: PTT  Assessment/Planning: POD#1 s/p thrombin injection of left femoral pseudoaneurysm.  Appeared thrombosed yesterday after injection.  Will repeat duplex today to confirm.  Has palpable left DP pulse.  Call with questions or concerns.    Marty Heck 05/13/2018 8:51 AM --

## 2018-05-13 NOTE — Progress Notes (Signed)
Patient received discharge information and acknowledged understanding of it. Patient received work note and eliquis 30 day free card. Patient IV was removed.

## 2018-05-17 ENCOUNTER — Ambulatory Visit: Payer: Self-pay | Admitting: Adult Health

## 2018-05-18 ENCOUNTER — Other Ambulatory Visit: Payer: Self-pay | Admitting: Cardiovascular Disease

## 2018-05-18 ENCOUNTER — Ambulatory Visit (INDEPENDENT_AMBULATORY_CARE_PROVIDER_SITE_OTHER): Payer: BLUE CROSS/BLUE SHIELD | Admitting: Adult Health

## 2018-05-18 ENCOUNTER — Ambulatory Visit
Admission: RE | Admit: 2018-05-18 | Discharge: 2018-05-18 | Disposition: A | Discharge status: Home / Self Care | Payer: BLUE CROSS/BLUE SHIELD | Source: Ambulatory Visit | Attending: Adult Health | Admitting: Adult Health

## 2018-05-18 ENCOUNTER — Encounter: Payer: Self-pay | Admitting: Adult Health

## 2018-05-18 VITALS — BP 127/85 | HR 81 | Ht 67.0 in | Wt 287.0 lb

## 2018-05-18 DIAGNOSIS — I739 Peripheral vascular disease, unspecified: Secondary | ICD-10-CM

## 2018-05-18 DIAGNOSIS — I729 Aneurysm of unspecified site: Secondary | ICD-10-CM | POA: Diagnosis not present

## 2018-05-18 DIAGNOSIS — Z79899 Other long term (current) drug therapy: Secondary | ICD-10-CM | POA: Diagnosis not present

## 2018-05-18 DIAGNOSIS — T81718A Complication of other artery following a procedure, not elsewhere classified, initial encounter: Secondary | ICD-10-CM

## 2018-05-18 DIAGNOSIS — I10 Essential (primary) hypertension: Secondary | ICD-10-CM

## 2018-05-18 DIAGNOSIS — M25561 Pain in right knee: Secondary | ICD-10-CM | POA: Diagnosis not present

## 2018-05-18 DIAGNOSIS — M1711 Unilateral primary osteoarthritis, right knee: Secondary | ICD-10-CM | POA: Diagnosis not present

## 2018-05-18 DIAGNOSIS — Z95820 Peripheral vascular angioplasty status with implants and grafts: Secondary | ICD-10-CM

## 2018-05-18 NOTE — Patient Instructions (Signed)
Medication Instructions:  NO CHANGES- Your physician recommends that you continue on your current medications as directed. Please refer to the Current Medication list given to you today. If you need a refill on your cardiac medications before your next appointment, please call your pharmacy.  Labwork: CBC AND BMET TODAY HERE IN OUR OFFICE AT [facility]  Take the provided lab slips with you to the lab for your blood draw.   When you have your labs (blood work) drawn today and your tests are completely normal, you will receive your results only by MyChart Message (if you have MyChart) -OR-  A paper copy in the mail.  If you have any lab test that is abnormal or we need to change your treatment, we will call you to review these results.  Testing/Procedures: RIGHT KNEE XRAY TODAY- [place]  Special Instructions: REMAIN OUT OF WORK UNTIL DR BERRY APPT ON 05-31-2018  Follow-Up: You will need a follow up [place]. You may see DR Vita Erm, MD Jory Sims, DNP, AACC  or one of the following Advanced Practice Providers on your designated Care Team:   Kerin Ransom, PA-C Roby Lofts, [place] . Sande Rives, PA-C  At [facility], you and your health needs are our priority.  As part of our continuing mission to provide you with exceptional heart care, we have created designated Provider Care Teams.  These Care Teams include your primary Cardiologist (physician) and Advanced Practice Providers (APPs -  Physician Assistants and Nurse Practitioners) who all work together to provide you with the care you need, when you need it.  Thank you for choosing [facility]!!

## 2018-05-18 NOTE — Progress Notes (Signed)
Cardiology Office Note   Date:  05/18/2018   ID:  Christina [name], DOB 30-Nov-1965, MRN [id number]  PCP:  Darreld Mclean, MD  Cardiologist:  Nahser Chief Complaint  Patient presents with  . Transitions Of Care    NO NEW COMPLAINTS     History of Present Illness: Christina [name] is a 53 y.o. female who presents for post hospitalization follow up after admission for critical limb ischemia s/p abdominal aortogram and penumbra aspiration thrombectomy,of the right popliteal artery, anterior tibial artery, tibioperoneal trunk and posterior tibial artery.   She had a successful chocolate balloon angioplasty of the right popliteal artery and anterior tibial artery, with Tigris stenting of the right popliteal artery and anterior tibial artery on 05/08/2018.Marland Kitchen   She was started on ASA, Plavix and Eliquis. She had post procedure anemia and left groin pseudoaneurysm, with CT scan negative for retroperitoneal hemorrhage. Eliquis was held.  She received a thrombin injection by vascular surgery. Repeat duplex confirmed that pseudoaneurysm was closed on follow up. She was placed back on Eliquis, with last doses of Plavix and ASA on 06/07/2018. She was noted to have had prolonged fluoroscopy and needs close follow up.   She will need a CBC and evaluation of pseudoaneurysm. She is out of work until cleared by cardiology.   She comes today with complaints of right knee pain with walking described as sharp, especially when she is lying in bed. She has some distal right great toe numbness.  She is sore in her left groin, and has worries about the amount of ecchymosis. She denies chest pain or DOE.   Past Medical History:  Diagnosis Date  . Acute blood loss as cause of postoperative anemia   . Chest pain    a. prior coronary CT without CAD.  Marland Kitchen Essential hypertension 07/08/2016  . GERD (gastroesophageal reflux disease)   . PAD (peripheral artery disease) ([place])    a. 05/2018: PV angio subacute thrombotic occlusion of  her popliteal artery. She underwent successful penumbra aspiration thrombectomy, PTA and self-expanding stenting using overlapping Tigris self-expanding stents of a long thrombotic occlusion of the popliteal, anterior tibial and tibioperoneal trunk. Procedure c/b pseudoaneurysm/ABL anemia.  . 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[place])   . Uterine fibroid   . Vitamin D deficiency     Past Surgical History:  Procedure Laterality Date  . ABDOMINAL AORTOGRAM W/LOWER EXTREMITY Right 05/08/2018   Procedure: ABDOMINAL AORTOGRAM W/LOWER EXTREMITY;  Surgeon: Lorretta Harp, MD;  Location: [facility];  Service: Cardiovascular;  Laterality: Right;  . lump removed from breast at age 29    . ovarian cyst removed at 53yr old    . PERIPHERAL VASCULAR INTERVENTION Right 05/08/2018   Procedure: PERIPHERAL VASCULAR INTERVENTION;  Surgeon: Lorretta Harp, MD;  Location: [facility];  Service: Cardiovascular;  Laterality: Right;  Anterior tibial and popliteal stents  . PERIPHERAL VASCULAR THROMBECTOMY Right 05/08/2018   Procedure: PERIPHERAL VASCULAR THROMBECTOMY;  Surgeon: Lorretta Harp, MD;  Location: [facility];  Service: Cardiovascular;  Laterality: Right;  Popliteal, tibioperoneal trunk, Anterior tibial, Posterior tibial     Current Outpatient Medications  Medication Sig Dispense Refill  . apixaban (ELIQUIS) 5 MG TABS tablet Take 1 tablet (5 mg total) by mouth 2 (two) times daily. 60 tablet 6  . aspirin EC 81 MG EC tablet Take 1 tablet (81 mg total) by mouth daily. For 1 month total post-stent. Your last dose will be 06/07/2018. 25 tablet 0  . atorvastatin (  LIPITOR) 80 MG tablet Take 1 tablet (80 mg total) by mouth every evening. 30 tablet 6  . clopidogrel (PLAVIX) 75 MG tablet Take 1 tablet (75 mg total) by mouth daily. For 1 month total post-stent. Your last dose will be 06/07/2018. 25 tablet 0  . ferrous gluconate (FERGON) 324 MG tablet Take 1 tablet (324 mg total) by mouth daily  with breakfast. 30 tablet 1  . losartan (COZAAR) 100 MG tablet Take 0.5 tablets (50 mg total) by mouth daily.    . megestrol (MEGACE) 40 MG tablet Take 1 tablet (40 mg total) by mouth 2 (two) times daily. 40 mg daily. May take additional 40 mg in the event of heavy bleeding. (Patient taking differently: Take 40 mg by mouth See admin instructions. Take 1 tablet (40 mg) by mouth scheduled once daily. May take additional 40 mg in the event of heavy bleeding.) 60 tablet 5  . potassium chloride (K-DUR,KLOR-CON) 10 MEQ tablet Take 1 tablet (10 mEq total) by mouth daily. 30 tablet 2   No current facility-administered medications for this visit.     Allergies:   Lisinopril    Social History:  The patient  reports that she has never smoked. She has never used smokeless tobacco. She reports that she does not drink alcohol or use drugs.   Family History:  The patient's family history includes Dementia in her father; Hypertension in her mother.    ROS: All other systems are reviewed and negative. Unless otherwise mentioned in H&P    PHYSICAL EXAM: VS:  BP 127/85 (BP Location: Left Arm, Patient Position: Sitting)   Pulse 81   Ht 5\' 7"  (1.702 m)   Wt 287 lb (130.2 kg)   BMI 44.95 kg/m  , BMI Body mass index is 44.95 kg/m. GEN: Well nourished, well developed, in no acute distress HEENT: normal Neck: no JVD, carotid bruits, or masses Cardiac: RRR; no murmurs, rubs, or gallops,no edema  Respiratory:  Clear to auscultation bilaterally, normal work of breathing GI: soft, nontender, nondistended, + BS MS: no deformity or atrophy Skin: warm and dry, no rash. No areas of redness or burning on truck, legs or chest. Left upper leg ecchymosis, extensive spreading into the left groin and labia. There is some hardness on palpation of the cath insertion site, no bruit or edema. Some soreness to palpation of the left upper thigh.  Neuro:  Strength and sensation are intact Psych: euthymic mood, full  affect   EKG: NSR rate of 81 bpm. Non-specific T-wave abnormality.   Recent Labs: 04/03/2018: ALT 16 05/02/2018: TSH 0.769 05/10/2018: BUN 9; Creatinine, Ser 0.81; Potassium 3.6; Sodium 137 05/13/2018: Hemoglobin 9.7; Platelets 244    Lipid Panel    Component Value Date/Time   CHOL 174 05/02/2017 1021   TRIG 95 05/02/2017 1021   HDL 56 05/02/2017 1021   CHOLHDL 3.1 05/02/2017 1021   VLDL 18.6 06/23/2016 1516   LDLCALC 99 05/02/2017 1021      Wt Readings from Last 3 Encounters:  05/18/18 287 lb (130.2 kg)  05/13/18 273 lb 3.2 oz (123.9 kg)  04/28/18 289 lb (131.1 kg)    Other studies Reviewed: Aortogram with Run-off  05/08/2018 Procedures Performed:               1.  Ultrasound-guided right common femoral access               2.  Abdominal aortogram  3.  Contralateral access (second order catheter placement))               4.  Right lower extremity angiogram               5.  Penumbra aspiration thrombectomy right popliteal artery, anterior tibial artery, tibioperoneal trunk and posterior tibial artery               6.  Chocolate balloon angioplasty right popliteal artery and anterior tibial artery               7.  Tigris stenting right popliteal artery and anterior tibial artery  ASSESSMENT AND PLAN:  1. PAD: S/P aortogram with run-off revealing a subacute thrombotic occlusion of popliteal artery. She has successful penumbra aspiration thrombectomy, PTA and self expanding stent using overlapping Tigris self-expanding stents of a long thrombotic occlusion of the popliteal anterior and tibioperoneal truck.   She is now on ASA, Apixaban, and clopidogrel. Consider stopping ASA as she is on Triple therapy on next visit after one month as passed.   A full skin assessment was completed with patient changed into hospital gown for full exam.   She is to have repeat ABI's and doppler studies prior to next appointment. CBC and BMET will be ordered.   2. Left groin  pseudoaneurysm.: She has vascular surgeon consultation and received a thrombin injection. She has extensive ecchymosis of the left groin spreading into the labia and into left abdomen and upper thigh. I have marked the skin with permanent marker for evaluation of size and induration.  She is sore. No bruit was auscultated. Some stiffness under th skin at cath insertion site without pain.   3. Hypercholesterolemia:  She is on statin therapy. LDL , 70.   4.Hypertension: BP is currently controlled. Continue ARB.   5. Right knee pain; Will X-ray right knee.   Current medicines are reviewed at length with the patient today.    Labs/ tests ordered today include: CBC and BMET, right films.  Phill Myron. West Pugh, ANP, AACC   05/18/2018 1:09 PM    [facility] [place] [address] Office [phone] Fax [phone]

## 2018-05-19 LAB — CBC
Hematocrit: 28.3 % — ABNORMAL LOW (ref 34.0–46.6)
Hemoglobin: 9.7 g/dL — ABNORMAL LOW (ref 11.1–15.9)
MCH: 32.2 pg (ref 26.6–33.0)
MCHC: 34.3 g/dL (ref 31.5–35.7)
MCV: 94 fL (ref 79–97)
Platelets: 308 10*3/uL (ref 150–450)
RBC: 3.01 x10E6/uL — ABNORMAL LOW (ref 3.77–5.28)
RDW: 13.4 % (ref 11.7–15.4)
WBC: 7.5 10*3/uL (ref 3.4–10.8)

## 2018-05-19 LAB — BASIC METABOLIC PANEL
BUN/Creatinine Ratio: 18 (ref 9–23)
BUN: 13 mg/dL (ref 6–24)
CO2: 21 mmol/L (ref 20–29)
Calcium: 9.1 mg/dL (ref 8.7–10.2)
Chloride: 107 mmol/L — ABNORMAL HIGH (ref 96–106)
Creatinine, Ser: 0.74 mg/dL (ref 0.57–1.00)
GFR calc Af Amer: 106 mL/min/{1.73_m2} (ref >59)
GFR calc non Af Amer: 92 mL/min/{1.73_m2} (ref >59)
Glucose: 82 mg/dL (ref 65–99)
Potassium: 4.4 mmol/L (ref 3.5–5.2)
Sodium: 141 mmol/L (ref 134–144)

## 2018-05-22 ENCOUNTER — Other Ambulatory Visit: Payer: Self-pay

## 2018-05-22 DIAGNOSIS — Z79899 Other long term (current) drug therapy: Secondary | ICD-10-CM

## 2018-05-22 DIAGNOSIS — R899 Unspecified abnormal finding in specimens from other organs, systems and tissues: Secondary | ICD-10-CM

## 2018-05-22 NOTE — Addendum Note (Signed)
Addended by: Waylan Rocher on: 05/22/2018 11:11 AM   Modules accepted: Orders

## 2018-05-25 ENCOUNTER — Encounter: Payer: Self-pay | Admitting: Family Medicine

## 2018-05-25 NOTE — Telephone Encounter (Signed)
Called her back-  She is seeing Dr. Gwenlyn Found for her PAD and other concerns   She is getting FMLA paperwork sent to use   03/29/18 start of claim, good for a year. This is for her periodic MD visits

## 2018-05-26 ENCOUNTER — Encounter: Payer: Self-pay | Admitting: Family Medicine

## 2018-05-26 ENCOUNTER — Other Ambulatory Visit: Payer: Self-pay

## 2018-05-26 ENCOUNTER — Ambulatory Visit (HOSPITAL_COMMUNITY)
Admission: RE | Admit: 2018-05-26 | Discharge: 2018-05-26 | Disposition: A | Discharge status: Home / Self Care | Payer: BLUE CROSS/BLUE SHIELD | Source: Ambulatory Visit | Attending: Cardiovascular Disease | Admitting: Cardiovascular Disease

## 2018-05-26 DIAGNOSIS — Z95820 Peripheral vascular angioplasty status with implants and grafts: Secondary | ICD-10-CM | POA: Diagnosis not present

## 2018-05-26 DIAGNOSIS — I739 Peripheral vascular disease, unspecified: Secondary | ICD-10-CM

## 2018-05-26 DIAGNOSIS — Z79899 Other long term (current) drug therapy: Secondary | ICD-10-CM

## 2018-05-26 LAB — ANEMIA PROFILE B
Basophils Absolute: 0.1 10*3/uL (ref 0.0–0.2)
Basos: 1 %
EOS (ABSOLUTE): 0.3 10*3/uL (ref 0.0–0.4)
Eos: 3 %
Ferritin: 74 ng/mL (ref 15–150)
Folate: 7.8 ng/mL (ref >3.0)
Hematocrit: 32.6 % — ABNORMAL LOW (ref 34.0–46.6)
Hemoglobin: 10.9 g/dL — ABNORMAL LOW (ref 11.1–15.9)
Immature Grans (Abs): 0 10*3/uL (ref 0.0–0.1)
Immature Granulocytes: 0 %
Iron Saturation: 41 % (ref 15–55)
Iron: 125 ug/dL (ref 27–159)
Lymphocytes Absolute: 1.5 10*3/uL (ref 0.7–3.1)
Lymphs: 19 %
MCH: 31.8 pg (ref 26.6–33.0)
MCHC: 33.4 g/dL (ref 31.5–35.7)
MCV: 95 fL (ref 79–97)
Monocytes Absolute: 0.6 10*3/uL (ref 0.1–0.9)
Monocytes: 7 %
Neutrophils Absolute: 5.2 10*3/uL (ref 1.4–7.0)
Neutrophils: 70 %
Platelets: 298 10*3/uL (ref 150–450)
RBC: 3.43 x10E6/uL — ABNORMAL LOW (ref 3.77–5.28)
RDW: 13.6 % (ref 11.7–15.4)
Retic Ct Pct: 4.4 % — ABNORMAL HIGH (ref 0.6–2.6)
Total Iron Binding Capacity: 307 ug/dL (ref 250–450)
UIBC: 182 ug/dL (ref 131–425)
Vitamin B-12: 240 pg/mL (ref 232–1245)
WBC: 7.5 10*3/uL (ref 3.4–10.8)

## 2018-05-26 NOTE — Telephone Encounter (Signed)
Advised patient I would send this to you but to allow 5 to 7 business days for paperwork to be completed.

## 2018-05-29 ENCOUNTER — Encounter: Payer: Self-pay | Admitting: Family Medicine

## 2018-05-29 NOTE — Telephone Encounter (Signed)
FORWARDED TO Craig Staggers

## 2018-05-30 ENCOUNTER — Other Ambulatory Visit: Payer: Self-pay | Admitting: *Deleted

## 2018-05-30 DIAGNOSIS — I739 Peripheral vascular disease, unspecified: Secondary | ICD-10-CM

## 2018-05-30 NOTE — Progress Notes (Signed)
[place] at [facility] [address], [place], [place] [address] [phone]  Date:  05/31/2018   Name:  Christina [name]   DOB:  01-28-1966   MRN:  [id number]  PCP:  Darreld Mclean, MD    Chief Complaint: Anemia (possible anemia lab work? cardiology told her to follow with pcp) and FMLA (fill out fmla paperwork)   History of Present Illness:  Christina [name] is a 53 y.o. very pleasant female patient who presents with the following:  History of hypertension, peripheral arterial disease status post operative treatment earlier this month,she had 2 stents.  She was in the hospital from January 6 to January 11, and treated for critical limb ischemia -right leg.  She underwent an ultrasound-guided right common femoral access, abdominal aortogram, right limb extremity angiogram, aspiration thrombectomy of the right popliteal artery-anterior tibial artery-posterior tibial artery, and finally angioplasty and stenting of the right popliteal and anterior tibial arteries.  The patient reports this was a lengthy operation, and we do think she lost significant blood during her surgery.  She also has prediabetes, obesity.  Here today to discuss possible anemia Her cardiology provider asked her to come and see me to discuss labs. I last saw her in June, since then we have exchanged many email messages  It looks like her hemoglobin dropped around the time of her surgery, and not sure if this may be due to just operative blood losses Her hemoglobin was quite normal prior to her operation-and is already improving. Her hemoglobin in December was in the 13s, it dropped to a nadir of 9.3 on January 9, but on most recent recheck last week was up to 10.9.  She was also started on iron supplementation from the hospital  She also has achilles tendonitis in her left heel and is wearing a boot on her left leg She is seeing Dr. Gwenlyn Found later today regarding her recent  vascular procedure  Christina [name] reports that her blood pressure dose was cut in half upon discharge from the hospital, she is now taking losartan 50 mg instead of 100, and her HCTZ and carvedilol were held  Her blood pressure is higher today Patient Active Problem List   Diagnosis Date Noted  . 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[place])   . Acute blood loss as cause of postoperative anemia   . Critical limb ischemia with history of revascularization of same extremity 05/08/2018  . Peripheral arterial disease ([place]) 04/28/2018  . Obesity 07/08/2016  . Essential hypertension 07/08/2016  . Pre-diabetes 06/24/2016  . Uterine fibroid   . Vitamin D deficiency   . Atypical chest pain     Past Medical History:  Diagnosis Date  . Acute blood loss as cause of postoperative anemia   . Chest pain    a. prior coronary CT without CAD.  Marland Kitchen Essential hypertension 07/08/2016  . GERD (gastroesophageal reflux disease)   . PAD (peripheral artery disease) ([place])    a. 05/2018: PV angio subacute thrombotic occlusion of her popliteal artery. She underwent successful penumbra aspiration thrombectomy, PTA and self-expanding stenting using overlapping Tigris self-expanding stents of a long thrombotic occlusion of the popliteal, anterior tibial and tibioperoneal trunk. Procedure c/b pseudoaneurysm/ABL anemia.  . 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[place])   . Uterine fibroid   . Vitamin D deficiency     Past Surgical History:  Procedure Laterality Date  . ABDOMINAL AORTOGRAM W/LOWER EXTREMITY Right 05/08/2018   Procedure: ABDOMINAL AORTOGRAM W/LOWER EXTREMITY;  Surgeon: Lorretta Harp, MD;  Location: [facility];  Service: Cardiovascular;  Laterality: Right;  . lump removed from breast at age 7    . ovarian cyst removed at 53yr old    . PERIPHERAL VASCULAR INTERVENTION Right 05/08/2018   Procedure: PERIPHERAL VASCULAR INTERVENTION;  Surgeon: Lorretta Harp, MD;  Location: [facility];  Service:  Cardiovascular;  Laterality: Right;  Anterior tibial and popliteal stents  . PERIPHERAL VASCULAR THROMBECTOMY Right 05/08/2018   Procedure: PERIPHERAL VASCULAR THROMBECTOMY;  Surgeon: Lorretta Harp, MD;  Location: [facility];  Service: Cardiovascular;  Laterality: Right;  Popliteal, tibioperoneal trunk, Anterior tibial, Posterior tibial    Social History   Tobacco Use  . Smoking status: Never Smoker  . Smokeless tobacco: Never Used  Substance Use Topics  . Alcohol use: No  . Drug use: No    Family History  Problem Relation Age of Onset  . Hypertension Mother   . Dementia Father   . Colon polyps Neg Hx   . Crohn's disease Neg Hx   . Rectal cancer Neg Hx   . Stomach cancer Neg Hx   . Pancreatic cancer Neg Hx     Allergies  Allergen Reactions  . Lisinopril Cough    Medication list has been reviewed and updated.  Current Outpatient Medications on File Prior to Visit  Medication Sig Dispense Refill  . apixaban (ELIQUIS) 5 MG TABS tablet Take 1 tablet (5 mg total) by mouth 2 (two) times daily. 60 tablet 6  . aspirin EC 81 MG EC tablet Take 1 tablet (81 mg total) by mouth daily. For 1 month total post-stent. Your last dose will be 06/07/2018. 25 tablet 0  . atorvastatin (LIPITOR) 80 MG tablet Take 1 tablet (80 mg total) by mouth every evening. 30 tablet 6  . clopidogrel (PLAVIX) 75 MG tablet Take 1 tablet (75 mg total) by mouth daily. For 1 month total post-stent. Your last dose will be 06/07/2018. 25 tablet 0  . ferrous gluconate (FERGON) 324 MG tablet Take 1 tablet (324 mg total) by mouth daily with breakfast. 30 tablet 1  . losartan (COZAAR) 100 MG tablet Take 0.5 tablets (50 mg total) by mouth daily.    . megestrol (MEGACE) 40 MG tablet Take 1 tablet (40 mg total) by mouth 2 (two) times daily. 40 mg daily. May take additional 40 mg in the event of heavy bleeding. (Patient taking differently: Take 40 mg by mouth See admin instructions. Take 1 tablet (40 mg) by mouth  scheduled once daily. May take additional 40 mg in the event of heavy bleeding.) 60 tablet 5  . potassium chloride (K-DUR,KLOR-CON) 10 MEQ tablet Take 1 tablet (10 mEq total) by mouth daily. 30 tablet 2   No current facility-administered medications on file prior to visit.     Review of Systems:  As per HPI- otherwise negative.    Physical Examination: Vitals:   05/31/18 1404  BP: (!) 158/90  Pulse: 81  Resp: 18  Temp: 99.1 F (37.3 C)  SpO2: 98%   Vitals:   05/31/18 1404  Weight: 288 lb (130.6 kg)  Height: 5\' 7"  (1.702 m)   Body mass index is 45.11 kg/m. Ideal Body Weight: Weight in (lb) to have BMI = 25: 159.3  GEN: WDWN, NAD, Non-toxic, A & O x 3, obese, looks well  HEENT: Atraumatic, Normocephalic. Neck supple. No masses, No LAD. Ears and Nose: No external deformity. CV: RRR, No M/G/R. No  JVD. No thrill. No extra heart sounds. PULM: CTA B, no wheezes, crackles, rhonchi. No retractions. No resp. distress. No accessory muscle use. ABD: S, NT, ND, +BS. No rebound. No HSM. EXTR: No c/c/e NEURO Normal gait.  PSYCH: Normally interactive. Conversant. Not depressed or anxious appearing.  Calm demeanor.  Wearing a boot on her left foot due to achilles tendoniits   Assessment and Plan: PAD (peripheral artery disease) ([place])  Essential hypertension  Acute blood loss anemia  Following up today from recent hospitalization.  She also needed to complete FMLA paperwork today.  This is to cover her for various doctors appointments, and also as she has episodes of chest pain which may cause her to miss a day or 2 of work at a time.  I completed the FMLA paperwork, and gave it to her today.  We will scan this in her chart as well  Discussed her anemia, this is most likely due to blood loss from recent surgery.  Her hemoglobin is already improving.  I will order a CBC, she will come in to have this drawn in 4 to 6 weeks to follow-up.  She is actually going to Dr. Kennon Holter office  when she leaves here, I asked her to mention this to him.  I would suggest that she increase her losartan back to 100 mg a day unless Dr. Gwenlyn Found does not approve this  Signed Lamar Blinks, MD

## 2018-05-31 ENCOUNTER — Encounter: Payer: Self-pay | Admitting: Cardiovascular Disease

## 2018-05-31 ENCOUNTER — Encounter: Payer: Self-pay | Admitting: Family Medicine

## 2018-05-31 ENCOUNTER — Other Ambulatory Visit: Payer: Self-pay | Admitting: Pharmacist

## 2018-05-31 ENCOUNTER — Ambulatory Visit (INDEPENDENT_AMBULATORY_CARE_PROVIDER_SITE_OTHER): Payer: BLUE CROSS/BLUE SHIELD | Admitting: Family Medicine

## 2018-05-31 ENCOUNTER — Ambulatory Visit (INDEPENDENT_AMBULATORY_CARE_PROVIDER_SITE_OTHER): Payer: BLUE CROSS/BLUE SHIELD | Admitting: Cardiovascular Disease

## 2018-05-31 VITALS — BP 158/90 | HR 81 | Temp 99.1°F | Resp 18 | Ht 67.0 in | Wt 288.0 lb

## 2018-05-31 DIAGNOSIS — M71572 Other bursitis, not elsewhere classified, left ankle and foot: Secondary | ICD-10-CM | POA: Diagnosis not present

## 2018-05-31 DIAGNOSIS — I10 Essential (primary) hypertension: Secondary | ICD-10-CM

## 2018-05-31 DIAGNOSIS — I70229 Atherosclerosis of native arteries of extremities with rest pain, unspecified extremity: Secondary | ICD-10-CM

## 2018-05-31 DIAGNOSIS — Z959 Presence of cardiac and vascular implant and graft, unspecified: Secondary | ICD-10-CM | POA: Diagnosis not present

## 2018-05-31 DIAGNOSIS — D62 Acute posthemorrhagic anemia: Secondary | ICD-10-CM

## 2018-05-31 DIAGNOSIS — I998 Other disorder of circulatory system: Secondary | ICD-10-CM

## 2018-05-31 DIAGNOSIS — I739 Peripheral vascular disease, unspecified: Secondary | ICD-10-CM | POA: Diagnosis not present

## 2018-05-31 DIAGNOSIS — Z9889 Other specified postprocedural states: Secondary | ICD-10-CM

## 2018-05-31 DIAGNOSIS — M7662 Achilles tendinitis, left leg: Secondary | ICD-10-CM | POA: Diagnosis not present

## 2018-05-31 DIAGNOSIS — Z5181 Encounter for therapeutic drug level monitoring: Secondary | ICD-10-CM | POA: Diagnosis not present

## 2018-05-31 DIAGNOSIS — M7732 Calcaneal spur, left foot: Secondary | ICD-10-CM | POA: Diagnosis not present

## 2018-05-31 MED ORDER — APIXABAN 5 MG PO TABS: 5.0000 mg | ORAL_TABLET | Freq: Two times a day (BID) | ORAL | 6 refills | Status: DC

## 2018-05-31 MED ORDER — CLOPIDOGREL BISULFATE 75 MG PO TABS: 75.0000 mg | ORAL_TABLET | Freq: Every day | ORAL | 3 refills | Status: DC

## 2018-05-31 MED ORDER — LOSARTAN POTASSIUM 100 MG PO TABS: 100.0000 mg | ORAL_TABLET | Freq: Every day | ORAL | Status: DC

## 2018-05-31 NOTE — Patient Instructions (Signed)
Your BP is a bit high today- I think you likely need to go back up on your losartan to 100 mg a day However please ask Dr. Gwenlyn Found about this when you see him today  Likely your anemia is due to blood loss from your recent operation  Your counts already seem to be getting better; your body is making more blood  Please come in for a lab visit only in 4-6 week and we can repeat your blood counts then; I will order the test for you

## 2018-05-31 NOTE — Assessment & Plan Note (Signed)
Ms. Loscalzo returns today after her peripheral angiogram which I performed for critical limb ischemia 05/08/2018.  She had an occluded popliteal artery as well as proximal anterior tibial and tibioperoneal trunk.  This was thrombotic.  I used the penumbra aspiration device as well as chocolate balloon angioplasty and placed to [facility] stents in her popliteal artery and anterior tibial artery.  Her Doppler normalized and her pain resolved.  She did however have a left common femoral pseudoaneurysm which required ultrasound-guided thrombin injection with resolution.  She is now able to ambulate without difficulty.  She is on "triple therapy with low-dose aspirin, Plavix and Eliquis.  She will stop her aspirin on February 5 and continue Plavix and Eliquis for the the immediate future.  Ultimately, we will stop the Plavix and keep her only on Eliquis.  The the etiology of her thrombotic occlusion is still unclear.

## 2018-05-31 NOTE — Progress Notes (Signed)
05/31/2018 Christina [name]   09-03-1965  [id number]  Primary Physician Copland, Gay Filler, MD Primary Cardiologist: Lorretta Harp MD Lupe Carney, [place]  HPI:  Christina [name] is a 53 y.o.  severely overweight single African-American female mother of 1 child, grandmother and 3 grandchildren who works Chief Operating Officer at [employer].  She was referred by Dr. Acie Fredrickson for peripheral vascular evaluation because of fairly recent onset right calf claudication which is lifestyle limiting.  I last saw her in the office 04/28/2018. She has a history of hypertension.  She has chronic chest pain with a recent negative Myoview and a negative coronary CTA 1 year ago.  She had fairly recent onset right calf pain approxi-1 month ago that occurred when she woke up 1 morning and has been fairly persistent with ambulation.  She had [name] extremity arterial Doppler studies performed 04/28/2018 revealing a right ABI 0.61 with an occluded right popliteal artery.  From peripheral angiography 05/08/2018 revealing occluded above-knee popliteal artery extending down below the knee into the proximal anterior tibial and tibioperoneal trunk.  She had a long complex procedure with penumbra aspiration thrombectomy followed by balloon angioplasty and implantation of 2 overlapping [place] self-expanding stents beginning in the popliteal artery extending down into the anterior tibial artery.  Unfortunately her tibioperoneal trunk remains occluded.  Her Dopplers normalized and her pain is resolved.  She did unfortunately have a left common femoral pseudoaneurysm and ultimately underwent ultrasound-guided thrombin injection and successfully was closed.   Current Meds  Medication Sig  . apixaban (ELIQUIS) 5 MG TABS tablet Take 1 tablet (5 mg total) by mouth 2 (two) times daily.  Marland Kitchen aspirin EC 81 MG EC tablet Take 1 tablet (81 mg total) by mouth daily. For 1 month total post-stent. Your last dose will be 06/07/2018.  Marland Kitchen atorvastatin  (LIPITOR) 80 MG tablet Take 1 tablet (80 mg total) by mouth every evening.  . clopidogrel (PLAVIX) 75 MG tablet Take 1 tablet (75 mg total) by mouth daily. For 1 month total post-stent. Your last dose will be 06/07/2018.  . ferrous gluconate (FERGON) 324 MG tablet Take 1 tablet (324 mg total) by mouth daily with breakfast.  . losartan (COZAAR) 100 MG tablet Take 0.5 tablets (50 mg total) by mouth daily.  . megestrol (MEGACE) 40 MG tablet Take 1 tablet (40 mg total) by mouth 2 (two) times daily. 40 mg daily. May take additional 40 mg in the event of heavy bleeding. (Patient taking differently: Take 40 mg by mouth See admin instructions. Take 1 tablet (40 mg) by mouth scheduled once daily. May take additional 40 mg in the event of heavy bleeding.)  . potassium chloride (K-DUR,KLOR-CON) 10 MEQ tablet Take 1 tablet (10 mEq total) by mouth daily.     Allergies  Allergen Reactions  . Lisinopril Cough    Social History   Socioeconomic History  . Marital status: Single    Spouse name: Not on file  . Number of children: Not on file  . Years of education: Not on file  . Highest education level: Not on file  Occupational History  . Not on file  Social Needs  . Financial resource strain: Not on file  . Food insecurity:    Worry: Not on file    Inability: Not on file  . Transportation needs:    Medical: Not on file    Non-medical: Not on file  Tobacco Use  . Smoking status: Never Smoker  . Smokeless  tobacco: Never Used  Substance and Sexual Activity  . Alcohol use: No  . Drug use: No  . Sexual activity: Yes  Lifestyle  . Physical activity:    Days per week: Not on file    Minutes per session: Not on file  . Stress: Not on file  Relationships  . Social connections:    Talks on phone: Not on file    Gets together: Not on file    Attends religious service: Not on file    Active member of club or organization: Not on file    Attends meetings of clubs or organizations: Not on file     Relationship status: Not on file  . Intimate partner violence:    Fear of current or ex partner: Not on file    Emotionally abused: Not on file    Physically abused: Not on file    Forced sexual activity: Not on file  Other Topics Concern  . Not on file  Social History Narrative  . Not on file     Review of Systems: General: negative for chills, fever, night sweats or weight changes.  Cardiovascular: negative for chest pain, dyspnea on exertion, edema, orthopnea, palpitations, paroxysmal nocturnal dyspnea or shortness of breath Dermatological: negative for rash Respiratory: negative for cough or wheezing Urologic: negative for hematuria Abdominal: negative for nausea, vomiting, diarrhea, bright red blood per rectum, melena, or hematemesis Neurologic: negative for visual changes, syncope, or dizziness All other systems reviewed and are otherwise negative except as noted above.    Blood pressure (!) 172/112, pulse 97, height 5\' 7"  (1.702 m), weight 288 lb 3.2 oz (130.7 kg), SpO2 99 %.  General appearance: alert and no distress Neck: no adenopathy, no carotid bruit, no JVD, supple, symmetrical, trachea midline and thyroid not enlarged, symmetric, no tenderness/mass/nodules Lungs: clear to auscultation bilaterally Heart: regular rate and rhythm, S1, S2 normal, no murmur, click, rub or gallop Extremities: extremities normal, atraumatic, no cyanosis or edema Pulses: 2+ and symmetric Skin: Skin color, texture, turgor normal. No rashes or lesions Neurologic: Alert and oriented X 3, normal strength and tone. Normal symmetric reflexes. Normal coordination and gait  EKG not performed today  ASSESSMENT AND PLAN:   Critical limb ischemia with history of revascularization of same extremity [name] returns today after her peripheral angiogram which I performed for critical limb ischemia 05/08/2018.  She had an occluded popliteal artery as well as proximal anterior tibial and tibioperoneal  trunk.  This was thrombotic.  I used the penumbra aspiration device as well as chocolate balloon angioplasty and placed to [facility] stents in her popliteal artery and anterior tibial artery.  Her Doppler normalized and her pain resolved.  She did however have a left common femoral pseudoaneurysm which required ultrasound-guided thrombin injection with resolution.  She is now able to ambulate without difficulty.  She is on "triple therapy with low-dose aspirin, Plavix and Eliquis.  She will stop her aspirin on February 5 and continue Plavix and Eliquis for the the immediate future.  Ultimately, we will stop the Plavix and keep her only on Eliquis.  The the etiology of her thrombotic occlusion is still unclear.      Lorretta Harp MD FACP,FACC,FAHA, [facility] 05/31/2018 4:34 PM

## 2018-05-31 NOTE — Addendum Note (Signed)
Addended by: Annita Brod on: 05/31/2018 05:27 PM   Modules accepted: Orders

## 2018-05-31 NOTE — Patient Instructions (Signed)
Medication Instructions:  CONTINUE TAKING YOUR PLAVIX 75 MG BY MOUTH DAILY  INCREASE YOUR LOSARTAN TO 100 MG BY MOUTH DAILY If you need a refill on your cardiac medications before your next appointment, please call your pharmacy.   Lab work: NONE If you have labs (blood work) drawn today and your tests are completely normal, you will receive your results only by: Marland Kitchen MyChart Message (if you have MyChart) OR . A paper copy in the mail If you have any lab test that is abnormal or we need to change your treatment, we will call you to review the results.  Testing/Procedures: Your physician has requested that you have a lower or upper extremity arterial duplex. This test is an ultrasound of the arteries in the legs or arms. It looks at arterial blood flow in the legs and arms. Allow one hour for Lower and Upper Arterial scans. There are no restrictions or special instructions  SCHEDULE IN 6 MONTHS  Your physician has requested that you have an ankle brachial index (ABI). During this test an ultrasound and blood pressure cuff are used to evaluate the arteries that supply the arms and legs with blood. Allow thirty minutes for this exam. There are no restrictions or special instructions.  SCHEDULE IN 6 MONTHS  Follow-Up: At [facility], you and your health needs are our priority.  As part of our continuing mission to provide you with exceptional heart care, we have created designated Provider Care Teams.  These Care Teams include your primary Cardiologist (physician) and Advanced Practice Providers (APPs -  Physician Assistants and Nurse Practitioners) who all work together to provide you with the care you need, when you need it. . You will need a follow up appointment in 3 months. You may see Dr. Gwenlyn Found or one of the following Advanced Practice Providers on your designated Care Team:   . Kerin Ransom, [place] . Almyra Deforest, PA-C . Fabian Sharp, PA-C . Jory Sims, DNP . Rosaria Ferries,  PA-C . Roby Lofts, PA-C . Sande Rives, PA-C  Any Other Special Instructions Will Be Listed Below (If Applicable). KEEP A DAILY BLOOD PRESSURE LOG FOR 30 DAYS AND THEN RETURN TO OUR OFFICE FOR A VISIT WITH CLINICAL PHARMACIST IN OUR HYPERTENSION CLINIC

## 2018-06-02 ENCOUNTER — Telehealth: Payer: Self-pay | Admitting: *Deleted

## 2018-06-02 ENCOUNTER — Encounter: Payer: Self-pay | Admitting: Cardiology

## 2018-06-02 ENCOUNTER — Ambulatory Visit (INDEPENDENT_AMBULATORY_CARE_PROVIDER_SITE_OTHER): Payer: BLUE CROSS/BLUE SHIELD | Admitting: Medical

## 2018-06-02 VITALS — BP 144/78 | HR 87 | Ht 67.0 in | Wt 289.6 lb

## 2018-06-02 DIAGNOSIS — I743 Embolism and thrombosis of arteries of the lower extremities: Secondary | ICD-10-CM

## 2018-06-02 DIAGNOSIS — I10 Essential (primary) hypertension: Secondary | ICD-10-CM

## 2018-06-02 DIAGNOSIS — I739 Peripheral vascular disease, unspecified: Secondary | ICD-10-CM

## 2018-06-02 MED ORDER — CARVEDILOL 6.25 MG PO TABS: 6.2500 mg | ORAL_TABLET | Freq: Two times a day (BID) | ORAL | 4 refills | Status: DC

## 2018-06-02 NOTE — Patient Instructions (Signed)
Medication Instructions:  RESTART TAKING CARVEDILOL 6.25 MG TWICE A DAY    STOP TAKING ASPIRIN ON FE 5 , 2020 If you need a refill on your cardiac medications before your next appointment, please call your pharmacy.   Lab work: Not needed   If you have labs (blood work) drawn today and your tests are completely normal, you will receive your results only by: Marland Kitchen MyChart Message (if you have MyChart) OR . A paper copy in the mail If you have any lab test that is abnormal or we need to change your treatment, we will call you to review the results.  Testing/Procedures: NOT NEEDED  Follow-Up: At [facility], you and your health needs are our priority.  As part of our continuing mission to provide you with exceptional heart care, we have created designated Provider Care Teams.  These Care Teams include your primary Cardiologist (physician) and Advanced Practice Providers (APPs -  Physician Assistants and Nurse Practitioners) who all work together to provide you with the care you need, when you need it. You will need a follow up appointment in 6 months July 2020.  Please call our office 2 months in advance to schedule this appointment.  You may see Quay Burow, MD or one of the following Advanced Practice Providers on your designated Care Team:   Kerin Ransom, PA-C Roby Lofts, [place] . Sande Rives, PA-C  Any Other Special Instructions Will Be Listed Below (If Applicable). Your physician recommends that you schedule a follow-up appointment in 1 [place] - BLOOD PRESSURE   PLEASE PURCHASE A BLOOD PRESSURE MACHINE AND -RECORD  AND BRING READING WITH YOU AT APPOINTMENT WITH [facility] - BLOOD PRESSURE       DASH Eating Plan DASH stands for "Dietary Approaches to Stop Hypertension." The DASH eating plan is a healthy eating plan that has been shown to reduce high blood pressure (hypertension). It may also reduce your risk for type 2 diabetes, heart disease, and stroke. The DASH  eating plan may also help with weight loss. What are tips for following this plan?  General guidelines  Avoid eating more than 2,300 mg (milligrams) of salt (sodium) a day. If you have hypertension, you may need to reduce your sodium intake to 1,500 mg a day.  Limit alcohol intake to no more than 1 drink a day for nonpregnant women and 2 drinks a day for men. One drink equals 12 oz of beer, 5 oz of wine, or 1 oz of hard liquor.  Work with your health care provider to maintain a healthy body weight or to lose weight. Ask what an ideal weight is for you.  Get at least 30 minutes of exercise that causes your heart to beat faster (aerobic exercise) most days of the week. Activities may include walking, swimming, or biking.  Work with your health care provider or diet and nutrition specialist (dietitian) to adjust your eating plan to your individual calorie needs. Reading food labels   Check food labels for the amount of sodium per serving. Choose foods with less than 5 percent of the Daily Value of sodium. Generally, foods with less than 300 mg of sodium per serving fit into this eating plan.  To find whole grains, look for the word "whole" as the first word in the ingredient list. Shopping  Buy products labeled as "low-sodium" or "no salt added."  Buy fresh foods. Avoid canned foods and premade or frozen meals. Cooking  Avoid adding salt when cooking. Use  salt-free seasonings or herbs instead of table salt or sea salt. Check with your health care provider or pharmacist before using salt substitutes.  Do not fry foods. Cook foods using healthy methods such as baking, boiling, grilling, and broiling instead.  Cook with heart-healthy oils, such as olive, canola, soybean, or sunflower oil. Meal planning  Eat a balanced diet that includes: ? 5 or more servings of fruits and vegetables each day. At each meal, try to fill half of your plate with fruits and vegetables. ? Up to 6-8 servings  of whole grains each day. ? Less than 6 oz of lean meat, poultry, or fish each day. A 3-oz serving of meat is about the same size as a deck of cards. One egg equals 1 oz. ? 2 servings of low-fat dairy each day. ? A serving of nuts, seeds, or beans 5 times each week. ? Heart-healthy fats. Healthy fats called Omega-3 fatty acids are found in foods such as flaxseeds and coldwater fish, like sardines, salmon, and mackerel.  Limit how much you eat of the following: ? Canned or prepackaged foods. ? Food that is high in trans fat, such as fried foods. ? Food that is high in saturated fat, such as fatty meat. ? Sweets, desserts, sugary drinks, and other foods with added sugar. ? Full-fat dairy products.  Do not salt foods before eating.  Try to eat at least 2 vegetarian meals each week.  Eat more home-cooked food and less restaurant, buffet, and fast food.  When eating at a restaurant, ask that your food be prepared with less salt or no salt, if possible. What foods are recommended? The items listed may not be a complete list. Talk with your dietitian about what dietary choices are best for you. Grains Whole-grain or whole-wheat bread. Whole-grain or whole-wheat pasta. Brown rice. [place] Morrow. Bulgur. Whole-grain and low-sodium cereals. Pita bread. Low-fat, low-sodium crackers. Whole-wheat flour tortillas. Vegetables Fresh or frozen vegetables (raw, steamed, roasted, or grilled). Low-sodium or reduced-sodium tomato and vegetable juice. Low-sodium or reduced-sodium tomato sauce and tomato paste. Low-sodium or reduced-sodium canned vegetables. Fruits All fresh, dried, or frozen fruit. Canned fruit in natural juice (without added sugar). Meat and other protein foods Skinless chicken or [place]. Ground chicken or [place]. Pork with fat trimmed off. Fish and seafood. Egg whites. Dried beans, peas, or lentils. Unsalted nuts, nut butters, and seeds. Unsalted canned beans. Lean cuts of beef with fat  trimmed off. Low-sodium, lean deli meat. Dairy Low-fat (1%) or fat-free (skim) milk. Fat-free, low-fat, or reduced-fat cheeses. Nonfat, low-sodium ricotta or cottage cheese. Low-fat or nonfat yogurt. Low-fat, low-sodium cheese. Fats and oils Soft margarine without trans fats. Vegetable oil. Low-fat, reduced-fat, or light mayonnaise and salad dressings (reduced-sodium). Canola, safflower, olive, soybean, and sunflower oils. Avocado. Seasoning and other foods Herbs. Spices. Seasoning mixes without salt. Unsalted popcorn and pretzels. Fat-free sweets. What foods are not recommended? The items listed may not be a complete list. Talk with your dietitian about what dietary choices are best for you. Grains Baked goods made with fat, such as croissants, muffins, or some breads. Dry pasta or rice meal packs. Vegetables Creamed or fried vegetables. Vegetables in a cheese sauce. Regular canned vegetables (not low-sodium or reduced-sodium). Regular canned tomato sauce and paste (not low-sodium or reduced-sodium). Regular tomato and vegetable juice (not low-sodium or reduced-sodium). Angie Fava. Olives. Fruits Canned fruit in a light or heavy syrup. Fried fruit. Fruit in cream or butter sauce. Meat and other protein foods Fatty cuts  of meat. Ribs. Fried meat. Berniece Salines. Sausage. Bologna and other processed lunch meats. Salami. Fatback. Hotdogs. Bratwurst. Salted nuts and seeds. Canned beans with added salt. Canned or smoked fish. Whole eggs or egg yolks. Chicken or [place] with skin. Dairy Whole or 2% milk, cream, and half-and-half. Whole or full-fat cream cheese. Whole-fat or sweetened yogurt. Full-fat cheese. Nondairy creamers. Whipped toppings. Processed cheese and cheese spreads. Fats and oils Butter. Stick margarine. Lard. Shortening. Ghee. Bacon fat. Tropical oils, such as coconut, palm kernel, or palm oil. Seasoning and other foods Salted popcorn and pretzels. Onion salt, garlic salt, seasoned salt, table  salt, and sea salt. Worcestershire sauce. Tartar sauce. Barbecue sauce. Teriyaki sauce. Soy sauce, including reduced-sodium. Steak sauce. Canned and packaged gravies. Fish sauce. Oyster sauce. Cocktail sauce. Horseradish that you find on the shelf. Ketchup. Mustard. Meat flavorings and tenderizers. Bouillon cubes. Hot sauce and Tabasco sauce. Premade or packaged marinades. Premade or packaged taco seasonings. Relishes. Regular salad dressings. Where to find more information:  National Heart, Lung, and [place]: https://wilson-eaton.com/  American Heart Association: www.heart.org Summary  The DASH eating plan is a healthy eating plan that has been shown to reduce high blood pressure (hypertension). It may also reduce your risk for type 2 diabetes, heart disease, and stroke.  With the DASH eating plan, you should limit salt (sodium) intake to 2,300 mg a day. If you have hypertension, you may need to reduce your sodium intake to 1,500 mg a day.  When on the DASH eating plan, aim to eat more fresh fruits and vegetables, whole grains, lean proteins, low-fat dairy, and heart-healthy fats.  Work with your health care provider or diet and nutrition specialist (dietitian) to adjust your eating plan to your individual calorie needs. This information is not intended to replace advice given to you by your health care provider. Make sure you discuss any questions you have with your health care provider. Document Released: 04/08/2011 Document Revised: 04/12/2016 Document Reviewed: 04/12/2016 Elsevier Interactive Patient Education  2019 [employer].

## 2018-06-02 NOTE — Progress Notes (Signed)
Cardiology Office Note   Date:  06/02/2018   ID:  Darlisa Spruiell, DOB 1965-05-26, MRN [id number]  PCP:  Darreld Mclean, MD  Cardiologist:  Quay Burow, MD EP: None  Chief Complaint  Patient presents with  . Hypertension      History of Present Illness: Christina [name] is a 53 y.o. female with PMH of HTN, PAD s/p popliteal/anterior tibial artery stenting for management of a thrombotic occlusion 05/08/2018, procedure c/b left common femoral pseudoaneurysm requiring thrombin injection, and iron deficiency anemia, who presents for follow-up of her HTN.   She was last seen by cardiology, Dr. Gwenlyn Found, outpatient 05/30/2018 for follow-up of her PAD. Her follow-up dopplers revealed normal blood flow. Her blood pressure was noted to be elevated to 172/112 at that time and her losartan was increased to 100mg  . Last ischemic evaluation was a coronary CTA 03/2017 which revealed a calcium score of 0 and no evidence of CAD.    She returns today for follow-up of her HTN. She reported checking her blood pressure at [facility] yesterday and the reading was 190/100. She express fear of having a stroke. She has been having some headaches over the past several days but has not taken any OTC medications because she wasn't sure what she could take. We discussed that it was safe to take tylenol but she should avoid NSAID medications given need for DAPT and blood thinner at this time. She denies vision changes, focal weaknesses, gait instability, dizziness, lightheadedness, syncope, chest pain, SOB, or problems with bleeding. She reported being on carvedilol 6.25mg  BID prior to her surgery and is unsure why this medication was discontinued (per chart review, patient had some hypotension during admission).     ,  Past Medical History:  Diagnosis Date  . Acute blood loss as cause of postoperative anemia   . Chest pain    a. prior coronary CT without CAD.  Marland Kitchen Essential hypertension 07/08/2016  . GERD  (gastroesophageal reflux disease)   . PAD (peripheral artery disease) ([place])    a. 05/2018: PV angio subacute thrombotic occlusion of her popliteal artery. She underwent successful penumbra aspiration thrombectomy, PTA and self-expanding stenting using overlapping Tigris self-expanding stents of a long thrombotic occlusion of the popliteal, anterior tibial and tibioperoneal trunk. Procedure c/b pseudoaneurysm/ABL anemia.  . 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[place])   . Uterine fibroid   . Vitamin D deficiency     Past Surgical History:  Procedure Laterality Date  . ABDOMINAL AORTOGRAM W/LOWER EXTREMITY Right 05/08/2018   Procedure: ABDOMINAL AORTOGRAM W/LOWER EXTREMITY;  Surgeon: Lorretta Harp, MD;  Location: [facility];  Service: Cardiovascular;  Laterality: Right;  . lump removed from breast at age 89    . ovarian cyst removed at 53yr old    . PERIPHERAL VASCULAR INTERVENTION Right 05/08/2018   Procedure: PERIPHERAL VASCULAR INTERVENTION;  Surgeon: Lorretta Harp, MD;  Location: [facility];  Service: Cardiovascular;  Laterality: Right;  Anterior tibial and popliteal stents  . PERIPHERAL VASCULAR THROMBECTOMY Right 05/08/2018   Procedure: PERIPHERAL VASCULAR THROMBECTOMY;  Surgeon: Lorretta Harp, MD;  Location: [facility];  Service: Cardiovascular;  Laterality: Right;  Popliteal, tibioperoneal trunk, Anterior tibial, Posterior tibial     Current Outpatient Medications  Medication Sig Dispense Refill  . apixaban (ELIQUIS) 5 MG TABS tablet Take 1 tablet (5 mg total) by mouth 2 (two) times daily. 60 tablet 6  . atorvastatin (LIPITOR) 80 MG tablet Take 1 tablet (80 mg total)  by mouth every evening. 30 tablet 6  . clopidogrel (PLAVIX) 75 MG tablet Take 1 tablet (75 mg total) by mouth daily. For 1 month total post-stent. Your last dose will be 06/07/2018. 90 tablet 3  . ferrous gluconate (FERGON) 324 MG tablet Take 1 tablet (324 mg total) by mouth daily with breakfast. 30  tablet 1  . losartan (COZAAR) 100 MG tablet Take 1 tablet (100 mg total) by mouth daily.    . megestrol (MEGACE) 40 MG tablet Take 1 tablet (40 mg total) by mouth 2 (two) times daily. 40 mg daily. May take additional 40 mg in the event of heavy bleeding. (Patient taking differently: Take 40 mg by mouth See admin instructions. Take 1 tablet (40 mg) by mouth scheduled once daily. May take additional 40 mg in the event of heavy bleeding.) 60 tablet 5  . potassium chloride (K-DUR,KLOR-CON) 10 MEQ tablet Take 1 tablet (10 mEq total) by mouth daily. 30 tablet 2  . carvedilol (COREG) 6.25 MG tablet Take 1 tablet (6.25 mg total) by mouth 2 (two) times daily. 60 tablet 4   No current facility-administered medications for this visit.     Allergies:   Lisinopril    Social History:  The patient  reports that she has never smoked. She has never used smokeless tobacco. She reports that she does not drink alcohol or use drugs.   Family History:  The patient's family history includes Dementia in her father; Hypertension in her mother.    ROS:  Please see the history of present illness.   Otherwise, review of systems are positive for none.   All other systems are reviewed and negative.    PHYSICAL EXAM: VS:  BP (!) 144/78   Pulse 87   Ht 5\' 7"  (1.702 m)   Wt 289 lb 9.6 oz (131.4 kg)   BMI 45.36 kg/m  , BMI Body mass index is 45.36 kg/m. GEN: Well nourished, well developed, in no acute distress HEENT: sclera anicteric Neck: no JVD, carotid bruits, or masses Cardiac: RRR; no murmurs, rubs, or gallops,no edema  Respiratory:  clear to auscultation bilaterally, normal work of breathing GI: soft, obese, nontender, nondistended, + BS MS: no deformity or atrophy Skin: warm and dry, no rash Neuro:  Strength and sensation are intact Psych: euthymic mood, full affect   EKG:  EKG is not ordered today.   Recent Labs: 04/03/2018: ALT 16 05/02/2018: TSH 0.769 05/18/2018: BUN 13; Creatinine, Ser 0.74;  Potassium 4.4; Sodium 141 05/26/2018: Hemoglobin 10.9; Platelets 298    Lipid Panel    Component Value Date/Time   CHOL 174 05/02/2017 1021   TRIG 95 05/02/2017 1021   HDL 56 05/02/2017 1021   CHOLHDL 3.1 05/02/2017 1021   VLDL 18.6 06/23/2016 1516   LDLCALC 99 05/02/2017 1021      Wt Readings from Last 3 Encounters:  06/02/18 289 lb 9.6 oz (131.4 kg)  05/31/18 288 lb 3.2 oz (130.7 kg)  05/31/18 288 lb (130.6 kg)      Other studies Reviewed: Additional studies/ records that were reviewed today include:   Abdominal aortogram w LE 05/08/2018: Angiographic Data:   1: Abdominal aorta- widely patent 2: Right lower extremity- the right popliteal artery was occluded at the level of the knee down to the anterior tibial tibioperoneal trunk and posterior tibial artery.  IMPRESSION: Ms. Mcfayden has what appears to be a subacute thrombotic occlusion of her popliteal artery with ongoing severe lifestyle limiting claudication.  We will proceed with  penumbra aspiration thrombectomy, PTA plus or minus stenting.  Final Impression: Successful penumbra aspiration thrombectomy, PTA and self-expanding stenting using overlapping Tigris self-expanding stents of a long thrombotic occlusion of the popliteal, anterior tibial and tibioperoneal trunk.  Etiology of this is unclear.  It could potentially have been thromboembolic.  The patient does have a palpable pulse on either level of the foot at the end of the case.  She will need to be on dual antiplatelet therapy including aspirin and Plavix.  Heparin be restarted 4 hours after sheath removal without a bolus.  Ultimately, she will need to be started on a novel oral anticoagulant such as Eliquis.  She left the lab in stable condition.  Because of the prolonged fluoroscopy time she will need to be followed carefully as an outpatient.   ASSESSMENT AND PLAN:  1. HTN: BP improved to 144/78 today after increasing her losartan to 100mg  daily 05/30/2018. Prior  to surgery, she was on Hyzaar 100-25mg  and carvedilol 6.25mg  BID which appear to have been stopped due to hypotension while admitted, at which time she was discharged home on losartan 50mg  daily.   - Will restart carvedilol 6.25mg  BID at this time - Continue losartan 100mg  daily - Encouraged dietary and lifestyle modifications to promote weight loss - low sodium diet, avoiding sugary drinks, exercising 30 minutes per day.  - Will have her follow-up in the HTN clinic for BP check and medication adjustments in 1 month. Could consider adding HCTZ back to regimen or uptitrating carvedilol if BP still poorly controlled.  - Patient is anticipating delivery of her BP cuff next Wednesday and will keep a daily log of her BP's to bring to her HTN clinic visit.   2. PAD: s/p popliteal/anterior tibial artery stenting c/b left common femoral pseudoaneurysm requiring thrombin injection 05/08/2018.  - Plan to stop aspirin Feb 5 - Continue plavix for the time being  3. Arterial thrombus: found to have subacute thombus of the popliteal artery during PV procedure 05/08/2018. Started on eliquis. No complaints of bleeding - Continue eliquis.   Current medicines are reviewed at length with the patient today.  The patient does not have concerns regarding medicines.  The following changes have been made:  Will restart carvedilol 6.25mg  BID at this time  Labs/ tests ordered today include:  No orders of the defined types were placed in this encounter.    Disposition:   FU with the HTN clinic in 1 month and Dr. Gwenlyn Found in 6 months  Signed, Abigail Butts, PA-C  06/02/2018 2:10 PM

## 2018-06-02 NOTE — Telephone Encounter (Signed)
Received FMLA/STD paperwork from [facility]; completed as much as possible; forwarded to provider/SLS 01/31

## 2018-06-08 ENCOUNTER — Other Ambulatory Visit: Payer: Self-pay

## 2018-06-08 MED ORDER — CLOPIDOGREL BISULFATE 75 MG PO TABS: 75.0000 mg | ORAL_TABLET | Freq: Every day | ORAL | 3 refills | Status: DC

## 2018-06-15 ENCOUNTER — Encounter: Payer: Self-pay | Admitting: Family Medicine

## 2018-06-27 ENCOUNTER — Ambulatory Visit: Payer: BLUE CROSS/BLUE SHIELD

## 2018-06-29 ENCOUNTER — Ambulatory Visit (INDEPENDENT_AMBULATORY_CARE_PROVIDER_SITE_OTHER): Payer: BLUE CROSS/BLUE SHIELD | Admitting: Pharmacist

## 2018-06-29 ENCOUNTER — Other Ambulatory Visit (INDEPENDENT_AMBULATORY_CARE_PROVIDER_SITE_OTHER): Payer: BLUE CROSS/BLUE SHIELD

## 2018-06-29 ENCOUNTER — Encounter: Payer: Self-pay | Admitting: Family Medicine

## 2018-06-29 VITALS — BP 150/90 | HR 76 | Ht 67.0 in | Wt 287.0 lb

## 2018-06-29 DIAGNOSIS — D62 Acute posthemorrhagic anemia: Secondary | ICD-10-CM | POA: Diagnosis not present

## 2018-06-29 DIAGNOSIS — I10 Essential (primary) hypertension: Secondary | ICD-10-CM

## 2018-06-29 DIAGNOSIS — M25561 Pain in right knee: Secondary | ICD-10-CM

## 2018-06-29 LAB — CBC
HCT: 39.7 % (ref 36.0–46.0)
Hemoglobin: 13.3 g/dL (ref 12.0–15.0)
MCHC: 33.5 g/dL (ref 30.0–36.0)
MCV: 93.5 fl (ref 78.0–100.0)
Platelets: 259 10*3/uL (ref 150.0–400.0)
RBC: 4.24 Mil/uL (ref 3.87–5.11)
RDW: 13.7 % (ref 11.5–15.5)
WBC: 5.9 10*3/uL (ref 4.0–10.5)

## 2018-06-29 LAB — FERRITIN: Ferritin: 23.5 ng/mL (ref 10.0–291.0)

## 2018-06-29 MED ORDER — VALSARTAN 160 MG PO TABS: 160.0000 mg | ORAL_TABLET | Freq: Every day | ORAL | 1 refills | Status: DC

## 2018-06-29 NOTE — Patient Instructions (Signed)
Return for a  follow up appointment in 4 weeks  Blood work: 2 weeks  Check your blood pressure at home daily (if able) and keep record of the readings.  Take your BP meds as follows: *STOP taking losartan* *START taking valsartan 160mg  daily*  Bring all of your meds, your BP cuff and your record of home blood pressures to your next appointment.  Exercise as you're able, try to walk approximately 30 minutes per day.  Keep salt intake to a minimum, especially watch canned and prepared boxed foods.  Eat more fresh fruits and vegetables and fewer canned items.  Avoid eating in fast food restaurants.    HOW TO TAKE YOUR BLOOD PRESSURE: . Rest 5 minutes before taking your blood pressure. .  Don't smoke or drink caffeinated beverages for at least 30 minutes before. . Take your blood pressure before (not after) you eat. . Sit comfortably with your back supported and both feet on the floor (don't cross your legs). . Elevate your arm to heart level on a table or a desk. . Use the proper sized cuff. It should fit smoothly and snugly around your bare upper arm. There should be enough room to slip a fingertip under the cuff. The bottom edge of the cuff should be 1 inch above the crease of the elbow. . Ideally, take 3 measurements at one sitting and record the average.

## 2018-06-29 NOTE — Progress Notes (Signed)
I received her labs  Results for orders placed or performed in visit on 06/29/18  Ferritin  Result Value Ref Range   Ferritin 23.5 10.0 - 291.0 ng/mL  CBC  Result Value Ref Range   WBC 5.9 4.0 - 10.5 K/uL   RBC 4.24 3.87 - 5.11 Mil/uL   Platelets 259.0 150.0 - 400.0 K/uL   Hemoglobin 13.3 12.0 - 15.0 g/dL   HCT 39.7 36.0 - 46.0 %   MCV 93.5 78.0 - 100.0 fl   MCHC 33.5 30.0 - 36.0 g/dL   RDW 13.7 11.5 - 15.5 %   Message to pt

## 2018-06-29 NOTE — Progress Notes (Signed)
Patient ID: Christina [name]                 DOB: November 20, 1965                      MRN: [id number]     HPI: Christina [name] is a 53 y.o. female patient of Dr Gwenlyn Found referred by Roby Lofts PA-C to hypertension clinic. PMH includes uncontrolled hypertension, and PAD s/p stent placement plus thrombectomy. During most recent visit with K Kroeger PA, carvedilol 6.25mg  twice daily was added to her therapy. Patient presents today HTN clinic report compliance. Denies dizziness, increased fatigue, chest pain, headaches, or shortness of breath.  Current HTN meds:  Carvedilol 6.25mg  twice daily Losartan 100mg  daily  Intolerance: lisinopril - cough  BP goal: <130/80  Family History: The patient's family history includes Dementia in her father; Hypertension in her mother.   Social History: The patient  reports that she has never smoked. She has never used smokeless tobacco. She reports that she does not drink alcohol or use drugs.   Diet: lots of canned products,   Exercise: activities of daily leaving  Home BP readings:  21 readings; average 142/84; HR 79-95bpm  Wt Readings from Last 3 Encounters:  06/29/18 287 lb (130.2 kg)  06/02/18 289 lb 9.6 oz (131.4 kg)  05/31/18 288 lb 3.2 oz (130.7 kg)   BP Readings from Last 3 Encounters:  06/29/18 (!) 150/90  06/02/18 (!) 144/78  05/31/18 (!) 172/112   Pulse Readings from Last 3 Encounters:  06/29/18 76  06/02/18 87  05/31/18 97     Past Medical History:  Diagnosis Date  . Acute blood loss as cause of postoperative anemia   . Chest pain    a. prior coronary CT without CAD.  Marland Kitchen Essential hypertension 07/08/2016  . GERD (gastroesophageal reflux disease)   . PAD (peripheral artery disease) ([place])    a. 05/2018: PV angio subacute thrombotic occlusion of her popliteal artery. She underwent successful penumbra aspiration thrombectomy, PTA and self-expanding stenting using overlapping Tigris self-expanding stents of a long thrombotic occlusion of the  popliteal, anterior tibial and tibioperoneal trunk. Procedure c/b pseudoaneurysm/ABL anemia.  . 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[place])   . Uterine fibroid   . Vitamin D deficiency     Current Outpatient Medications on File Prior to Visit  Medication Sig Dispense Refill  . apixaban (ELIQUIS) 5 MG TABS tablet Take 1 tablet (5 mg total) by mouth 2 (two) times daily. 60 tablet 6  . atorvastatin (LIPITOR) 80 MG tablet Take 1 tablet (80 mg total) by mouth every evening. 30 tablet 6  . carvedilol (COREG) 6.25 MG tablet Take 1 tablet (6.25 mg total) by mouth 2 (two) times daily. 60 tablet 4  . clopidogrel (PLAVIX) 75 MG tablet Take 1 tablet (75 mg total) by mouth daily. 90 tablet 3  . ferrous gluconate (FERGON) 324 MG tablet Take 1 tablet (324 mg total) by mouth daily with breakfast. 30 tablet 1  . megestrol (MEGACE) 40 MG tablet Take 1 tablet (40 mg total) by mouth 2 (two) times daily. 40 mg daily. May take additional 40 mg in the event of heavy bleeding. (Patient taking differently: Take 40 mg by mouth See admin instructions. Take 1 tablet (40 mg) by mouth scheduled once daily. May take additional 40 mg in the event of heavy bleeding.) 60 tablet 5  . potassium chloride (K-DUR,KLOR-CON) 10 MEQ tablet Take 1 tablet (10 mEq total) by mouth daily. 30 tablet  2   No current facility-administered medications on file prior to visit.     Allergies  Allergen Reactions  . Lisinopril Cough    Blood pressure (!) 150/90, pulse 76, height 5\' 7"  (1.702 m), weight 287 lb (130.2 kg), SpO2 95 %.  Essential hypertension BP remains significantly above goal. Patient reports compliance and denies problems with current therapy.  Will d/c losartan, start valsartan 160mg  daily, continue close monitoring,  And repeat BMET in 2 weeks. Plan to follow up with HTN clinic in 4 weeks and increase valsartan to 320mg  daily if additional BP control needed and renal function remains stable.   Jaydn Moscato Rodriguez-Guzman PharmD,  BCPS, [place] [id number] 06/30/2018 5:54 PM

## 2018-06-30 ENCOUNTER — Encounter: Payer: Self-pay | Admitting: Pharmacist

## 2018-06-30 NOTE — Assessment & Plan Note (Signed)
BP remains significantly above goal. Patient reports compliance and denies problems with current therapy.  Will d/c losartan, start valsartan 160mg  daily, continue close monitoring,  And repeat BMET in 2 weeks. Plan to follow up with HTN clinic in 4 weeks and increase valsartan to 320mg  daily if additional BP control needed and renal function remains stable.

## 2018-07-04 ENCOUNTER — Other Ambulatory Visit (HOSPITAL_COMMUNITY): Payer: Self-pay | Admitting: Physician Assistant

## 2018-07-04 ENCOUNTER — Other Ambulatory Visit: Payer: Self-pay | Admitting: Family Medicine

## 2018-07-12 ENCOUNTER — Other Ambulatory Visit (HOSPITAL_COMMUNITY): Payer: Self-pay | Admitting: Physician Assistant

## 2018-07-13 ENCOUNTER — Ambulatory Visit (INDEPENDENT_AMBULATORY_CARE_PROVIDER_SITE_OTHER): Payer: BLUE CROSS/BLUE SHIELD | Admitting: Orthopedic Surgery

## 2018-07-14 DIAGNOSIS — I10 Essential (primary) hypertension: Secondary | ICD-10-CM | POA: Diagnosis not present

## 2018-07-15 LAB — BASIC METABOLIC PANEL
BUN/Creatinine Ratio: 15 (ref 9–23)
BUN: 14 mg/dL (ref 6–24)
CO2: 21 mmol/L (ref 20–29)
Calcium: 9.7 mg/dL (ref 8.7–10.2)
Chloride: 104 mmol/L (ref 96–106)
Creatinine, Ser: 0.92 mg/dL (ref 0.57–1.00)
GFR calc Af Amer: 82 mL/min/{1.73_m2} (ref >59)
GFR calc non Af Amer: 71 mL/min/{1.73_m2} (ref >59)
Glucose: 90 mg/dL (ref 65–99)
Potassium: 4.5 mmol/L (ref 3.5–5.2)
Sodium: 140 mmol/L (ref 134–144)

## 2018-07-22 ENCOUNTER — Encounter: Payer: Self-pay | Admitting: Family Medicine

## 2018-07-25 ENCOUNTER — Telehealth (INDEPENDENT_AMBULATORY_CARE_PROVIDER_SITE_OTHER): Payer: Self-pay

## 2018-07-25 NOTE — Telephone Encounter (Signed)
I called patient and screened for questions stated no and will come in or reschedule appt if she can not make it in

## 2018-07-27 ENCOUNTER — Ambulatory Visit (INDEPENDENT_AMBULATORY_CARE_PROVIDER_SITE_OTHER): Payer: BLUE CROSS/BLUE SHIELD | Admitting: Physician Assistant

## 2018-07-27 ENCOUNTER — Encounter (INDEPENDENT_AMBULATORY_CARE_PROVIDER_SITE_OTHER): Payer: Self-pay | Admitting: Orthopedic Surgery

## 2018-07-27 ENCOUNTER — Encounter: Payer: Self-pay | Admitting: Family Medicine

## 2018-07-27 ENCOUNTER — Telehealth (INDEPENDENT_AMBULATORY_CARE_PROVIDER_SITE_OTHER): Payer: Self-pay | Admitting: Orthopedic Surgery

## 2018-07-27 ENCOUNTER — Ambulatory Visit: Payer: BLUE CROSS/BLUE SHIELD

## 2018-07-27 ENCOUNTER — Other Ambulatory Visit: Payer: Self-pay

## 2018-07-27 ENCOUNTER — Ambulatory Visit (INDEPENDENT_AMBULATORY_CARE_PROVIDER_SITE_OTHER): Payer: BLUE CROSS/BLUE SHIELD | Admitting: Orthopedic Surgery

## 2018-07-27 VITALS — Ht 67.0 in | Wt 287.0 lb

## 2018-07-27 DIAGNOSIS — M1711 Unilateral primary osteoarthritis, right knee: Secondary | ICD-10-CM

## 2018-07-27 DIAGNOSIS — Z9889 Other specified postprocedural states: Secondary | ICD-10-CM

## 2018-07-27 DIAGNOSIS — Z959 Presence of cardiac and vascular implant and graft, unspecified: Secondary | ICD-10-CM

## 2018-07-27 DIAGNOSIS — I70229 Atherosclerosis of native arteries of extremities with rest pain, unspecified extremity: Secondary | ICD-10-CM

## 2018-07-27 DIAGNOSIS — Z7901 Long term (current) use of anticoagulants: Secondary | ICD-10-CM

## 2018-07-27 DIAGNOSIS — I998 Other disorder of circulatory system: Secondary | ICD-10-CM | POA: Diagnosis not present

## 2018-07-27 DIAGNOSIS — Z6841 Body Mass Index (BMI) 40.0 and over, adult: Secondary | ICD-10-CM

## 2018-07-27 MED ORDER — METHYLPREDNISOLONE ACETATE 40 MG/ML IJ SUSP
40.0000 mg | INTRAMUSCULAR | Status: AC | PRN
  Administered 2018-07-27: 40 mg via INTRA_ARTICULAR

## 2018-07-27 MED ORDER — LIDOCAINE HCL 1 % IJ SOLN
5.0000 mL | INTRAMUSCULAR | Status: AC | PRN
  Administered 2018-07-27: 5 mL

## 2018-07-27 NOTE — Progress Notes (Signed)
Office Visit Note   Patient: Christina [name]           Date of Birth: 10-May-1965           MRN: [id number] Visit Date: 07/27/2018              Requested by: Darreld Mclean, MD [address] [place],  [id number] PCP: Darreld Mclean, MD  Chief Complaint  Patient presents with  . Right Knee - Pain      HPI: The patient is a 53 year old woman who is seen for evaluation of right knee pain.  She reports that she has had some severe pain in her right knee off and on but it is becoming more consistent over the past several months and she particularly notes increased pain when she is trying to sleep at night.  She reports when she turns over in bed that the pain is so severe it causes her to scream.  She does note popping and clicking of the knee as well.  She is anticoagulated on Eliquis and recently had a stent to the right popliteal artery for severe occlusion of the popliteal artery.  Her tibioperoneal trunk remains occluded.  She reports that her claudication pain has improved but her knee is causing her significant discomfort and decreased mobility.  She did have radiographs done of the right knee at [facility] back in January of this year and this shows tricompartmental arthritic changes the most severe being in the patellofemoral joint.  She is unable to take nonsteroidal anti-inflammatories due to her chronic anticoagulation.  Assessment & Plan: Visit Diagnoses:  1. Unilateral primary osteoarthritis, right knee   2. BMI 40.0-44.9, adult ([place])   3. Critical limb ischemia with history of revascularization of same extremity   4. Current use of long term anticoagulation     Plan: Dr. Sharol Given saw and reviewed the patient's x-rays and spoke with the patient as well.  We counseled the patient that she has severe degenerative changes particularly in the patellofemoral joint and at some point will require a total knee arthroplasty.  Counseled the patient that her  case is more risky due to her history of popliteal artery occlusion and concerns over her stenting making her at increased risk for complications.  Offered the patient an intra-articular steroid injection to see if this will alleviate some of her symptoms and discussed that she could have several steroid injections per year if they are of benefit.  We also discussed that weight reduction is paramount and will help with her symptoms as well as will be required prior to proceeding with any attempts at arthroplasty.  The patient wishes to proceed with intra-articular steroid injection at this time and this was completed and the patient tolerated the procedure well.  Instructed the patient to utilize ice if needed this evening on the knee for any distance comfort.  Instructed the patient to take it easy following the steroid injection.  She will follow-up here in 6 weeks or sooner should she have difficulties in the interim.  Follow-Up Instructions: Return in about 6 weeks (around 09/07/2018).   Ortho Exam  Patient is alert, oriented, no adenopathy, well-dressed, normal affect, normal respiratory effort. The patient is a centrally obese female who ambulates with a mildly antalgic gait.  Her right knee range of motion 0- 105 with pain on end flexion.  She has crepitus with range of motion and but pain over the anterior knee greater than  the medial and lateral joint line.  There is no instability with varus valgus stress and negative drawer sign.  Imaging: No results found. No images are attached to the encounter.  Labs: Lab Results  Component Value Date   HGBA1C 5.7 (H) 05/02/2017   HGBA1C 6.0 06/23/2016     Lab Results  Component Value Date   ALBUMIN 4.0 04/03/2018   ALBUMIN 3.5 01/31/2017   ALBUMIN 4.0 06/23/2016    Body mass index is 44.95 kg/m.  Orders:  Orders Placed This Encounter  Procedures  . Large Joint Inj   No orders of the defined types were placed in this encounter.     Procedures: Large Joint Inj: R knee on 07/27/2018 4:59 PM Indications: pain and diagnostic evaluation Details: 22 G 1.5 in needle, anteromedial approach  Arthrogram: No  Medications: 5 mL lidocaine 1 %; 40 mg methylPREDNISolone acetate 40 MG/ML Outcome: tolerated well, no immediate complications Procedure, treatment alternatives, risks and benefits explained, specific risks discussed. Consent was given by the patient. Immediately prior to procedure a time out was called to verify the correct patient, procedure, equipment, support staff and site/side marked as required. Patient was prepped and draped in the usual sterile fashion.      Clinical Data: No additional findings.  ROS:  All other systems negative, except as noted in the HPI. Review of Systems  Objective: Vital Signs: Ht 5\' 7"  (1.702 m)   Wt 287 lb (130.2 kg)   BMI 44.95 kg/m   Specialty Comments:  No specialty comments available.  PMFS History: Patient Active Problem List   Diagnosis Date Noted  . 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[place])   . Acute blood loss as cause of postoperative anemia   . Critical limb ischemia with history of revascularization of same extremity 05/08/2018  . Peripheral arterial disease ([place]) 04/28/2018  . Obesity 07/08/2016  . Essential hypertension 07/08/2016  . Pre-diabetes 06/24/2016  . Uterine fibroid   . Vitamin D deficiency   . Atypical chest pain    Past Medical History:  Diagnosis Date  . Acute blood loss as cause of postoperative anemia   . Chest pain    a. prior coronary CT without CAD.  Marland Kitchen Essential hypertension 07/08/2016  . GERD (gastroesophageal reflux disease)   . PAD (peripheral artery disease) ([place])    a. 05/2018: PV angio subacute thrombotic occlusion of her popliteal artery. She underwent successful penumbra aspiration thrombectomy, PTA and self-expanding stenting using overlapping Tigris self-expanding stents of a long thrombotic occlusion of the popliteal, anterior  tibial and tibioperoneal trunk. Procedure c/b pseudoaneurysm/ABL anemia.  . 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[place])   . Uterine fibroid   . Vitamin D deficiency     Family History  Problem Relation Age of Onset  . Hypertension Mother   . Dementia Father   . Colon polyps Neg Hx   . Crohn's disease Neg Hx   . Rectal cancer Neg Hx   . Stomach cancer Neg Hx   . Pancreatic cancer Neg Hx     Past Surgical History:  Procedure Laterality Date  . ABDOMINAL AORTOGRAM W/LOWER EXTREMITY Right 05/08/2018   Procedure: ABDOMINAL AORTOGRAM W/LOWER EXTREMITY;  Surgeon: Lorretta Harp, MD;  Location: [facility];  Service: Cardiovascular;  Laterality: Right;  . lump removed from breast at age 67    . ovarian cyst removed at 53yr old    . PERIPHERAL VASCULAR INTERVENTION Right 05/08/2018   Procedure: PERIPHERAL VASCULAR INTERVENTION;  Surgeon: Lorretta Harp, MD;  Location: [facility];  Service: Cardiovascular;  Laterality: Right;  Anterior tibial and popliteal stents  . PERIPHERAL VASCULAR THROMBECTOMY Right 05/08/2018   Procedure: PERIPHERAL VASCULAR THROMBECTOMY;  Surgeon: Lorretta Harp, MD;  Location: [facility];  Service: Cardiovascular;  Laterality: Right;  Popliteal, tibioperoneal trunk, Anterior tibial, Posterior tibial   Social History   Occupational History  . Not on file  Tobacco Use  . Smoking status: Never Smoker  . Smokeless tobacco: Never Used  Substance and Sexual Activity  . Alcohol use: No  . Drug use: No  . Sexual activity: Yes

## 2018-07-28 ENCOUNTER — Encounter: Payer: Self-pay | Admitting: Family Medicine

## 2018-07-28 ENCOUNTER — Encounter: Payer: Self-pay | Admitting: Internal Medicine

## 2018-07-28 ENCOUNTER — Ambulatory Visit (INDEPENDENT_AMBULATORY_CARE_PROVIDER_SITE_OTHER): Payer: BLUE CROSS/BLUE SHIELD | Admitting: Internal Medicine

## 2018-07-28 DIAGNOSIS — B349 Viral infection, unspecified: Secondary | ICD-10-CM | POA: Diagnosis not present

## 2018-07-28 NOTE — Telephone Encounter (Signed)
See previous email from 07/27/18

## 2018-07-28 NOTE — Progress Notes (Signed)
Subjective:    Patient ID: Christina [name], female    DOB: [birth year], 53 y.o.   MRN: [id number]  DOS:  07/28/2018 Type of visit - description: Video conference visit The patient was doing okay, yesterday went to her orthopedic doctor, temperature was 99.0. They proceeded with a evaluation of right knee pain provided a local injection. At 3:30 PM she suddenly developed generalized aches and pains mostly at the upper back and hands. She felt somewhat febrile but does not have a thermometer to check her temperature.  She also developed some sweats.   Review of Systems Denies cough, sore throat. No chest pain no difficulty breathing No nausea or vomiting.  Appetite is okay She developed some headaches that decreased with Tylenol. No abdominal pain, dysuria or gross hematuria. No stroke symptoms such as face numbness or deficits  Past Medical History:  Diagnosis Date  . Acute blood loss as cause of postoperative anemia   . Chest pain    a. prior coronary CT without CAD.  Marland Kitchen Essential hypertension 07/08/2016  . GERD (gastroesophageal reflux disease)   . PAD (peripheral artery disease) ([place])    a. 05/2018: PV angio subacute thrombotic occlusion of her popliteal artery. She underwent successful penumbra aspiration thrombectomy, PTA and self-expanding stenting using overlapping Tigris self-expanding stents of a long thrombotic occlusion of the popliteal, anterior tibial and tibioperoneal trunk. Procedure c/b pseudoaneurysm/ABL anemia.  . 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[place])   . Uterine fibroid   . Vitamin D deficiency     Past Surgical History:  Procedure Laterality Date  . ABDOMINAL AORTOGRAM W/[name] EXTREMITY Right 05/08/2018   Procedure: ABDOMINAL AORTOGRAM W/[name] EXTREMITY;  Surgeon: Lorretta Harp, MD;  Location: [facility];  Service: Cardiovascular;  Laterality: Right;  . lump removed from breast at age 33    . ovarian cyst removed at 53yr old    . PERIPHERAL VASCULAR  INTERVENTION Right 05/08/2018   Procedure: PERIPHERAL VASCULAR INTERVENTION;  Surgeon: Lorretta Harp, MD;  Location: [facility];  Service: Cardiovascular;  Laterality: Right;  Anterior tibial and popliteal stents  . PERIPHERAL VASCULAR THROMBECTOMY Right 05/08/2018   Procedure: PERIPHERAL VASCULAR THROMBECTOMY;  Surgeon: Lorretta Harp, MD;  Location: [facility];  Service: Cardiovascular;  Laterality: Right;  Popliteal, tibioperoneal trunk, Anterior tibial, Posterior tibial    Social History   Socioeconomic History  . Marital status: Single    Spouse name: Not on file  . Number of children: Not on file  . Years of education: Not on file  . Highest education level: Not on file  Occupational History  . Not on file  Social Needs  . Financial resource strain: Not on file  . Food insecurity:    Worry: Not on file    Inability: Not on file  . Transportation needs:    Medical: Not on file    Non-medical: Not on file  Tobacco Use  . Smoking status: Never Smoker  . Smokeless tobacco: Never Used  Substance and Sexual Activity  . Alcohol use: No  . Drug use: No  . Sexual activity: Yes  Lifestyle  . Physical activity:    Days per week: Not on file    Minutes per session: Not on file  . Stress: Not on file  Relationships  . Social connections:    Talks on phone: Not on file    Gets together: Not on file    Attends religious service: Not on file  Active member of club or organization: Not on file    Attends meetings of clubs or organizations: Not on file    Relationship status: Not on file  . Intimate partner violence:    Fear of current or ex partner: Not on file    Emotionally abused: Not on file    Physically abused: Not on file    Forced sexual activity: Not on file  Other Topics Concern  . Not on file  Social History Narrative  . Not on file      Allergies as of 07/28/2018      Reactions   Lisinopril Cough      Medication List       Accurate  as of July 28, 2018  8:31 AM. Always use your most recent med list.        apixaban 5 MG Tabs tablet Commonly known as:  Eliquis Take 1 tablet (5 mg total) by mouth 2 (two) times daily.   atorvastatin 80 MG tablet Commonly known as:  LIPITOR Take 1 tablet (80 mg total) by mouth every evening.   carvedilol 6.25 MG tablet Commonly known as:  COREG Take 1 tablet (6.25 mg total) by mouth 2 (two) times daily.   clopidogrel 75 MG tablet Commonly known as:  PLAVIX Take 1 tablet (75 mg total) by mouth daily.   ferrous gluconate 324 MG tablet Commonly known as:  FERGON Take 1 tablet by mouth once daily with breakfast   megestrol 40 MG tablet Commonly known as:  MEGACE Take 1 tablet (40 mg total) by mouth 2 (two) times daily. 40 mg daily. May take additional 40 mg in the event of heavy bleeding.   potassium chloride 10 MEQ tablet Commonly known as:  K-DUR,KLOR-CON Take 1 tablet by mouth once daily   valsartan 160 MG tablet Commonly known as:  DIOVAN Take 1 tablet (160 mg total) by mouth daily. TO REPLACE LOSARTAN           Objective:   Physical Exam There were no vitals taken for this visit. 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 Symptoms started less than 24 hours ago. In the midst of a coronavirus pandemia this could be coronavirus, other viruses or other respiratory infection.  Time will tell. For now my recommendation is: Stay active at home, consider herself contagious, Tylenol is okay for aches and pains. If she develops other symptoms mild cough such as mild cough, is okay to treat at home with OTCs. If symptoms are more substantial, high fever, severe cough, chest pain, shortness of breath she needs to be seen. I would recommend first to  call the office but if it is unable to talk with somebody due to the call volumes she should go to urgent care or ER. We will send a message summarizing our recommendation.

## 2018-07-28 NOTE — Telephone Encounter (Signed)
Spoke with pt and scheduled Webex visit with Dr Larose Kells (doc of day) for today at 8:30am.

## 2018-08-08 NOTE — Telephone Encounter (Signed)
This does not sound vascular but with see Christina [name], her PCP for further evaluation.

## 2018-08-09 ENCOUNTER — Telehealth: Payer: Self-pay | Admitting: Cardiovascular Disease

## 2018-08-09 ENCOUNTER — Encounter: Payer: Self-pay | Admitting: Family Medicine

## 2018-08-09 NOTE — Telephone Encounter (Signed)
Attempted to contact patient back, LVM advising that response was sent via mychart.

## 2018-08-09 NOTE — Telephone Encounter (Signed)
° ° °  Patient requesting response to MyChart message regarding tingling in hands and fingers

## 2018-08-12 NOTE — Progress Notes (Signed)
[facility] [address], [place], [place] [address] [phone]  Date:  08/14/2018   Name:  Christina [name]   DOB:  04/19/1966   MRN:  [id number]  PCP:  Darreld Mclean, MD    Chief Complaint: No chief complaint on file.   History of Present Illness:  Christina [name] is a 53 y.o. very pleasant female patient who presents with the following:  Virtual visit today due to COVID 19 outbreak Pt location is in her car, doing some errands  Provider location- office Pt permission obtained for virtual visit.  Pt ID confirmed with name and DOB  Pt with history of HTN, pre-diabetes, critical limb ischemia/ right calf claudication which was treated by Dr. Gwenlyn Found in January.  She had surgery for this in January, and this is much better. She is on eliquis now to prevent re-occlusion  Today she notes that her RIGHT hand has been numb since 3/26- her index, long and ring fingers are effected.  She mostly just notes it when she is typing, or she may wake up with hit at night.  She will be able to shake it out, but then it may come back.  The numbness is not constant, but comes and goes The hand strength feels normal No other neurological symptoms noted No facial drooping, no difficulty speaking The left hand is ok, she is right handed   Also, last week she noted that she was having gum pain-  She wears dentures, but no other gum pain even when the dentures were removed The pain was causing difficulty with eating Now she is feeling well- her gums seem to have gotten better and her symptoms are resolved She has had these dentures for about 20 years now she thinks  She does not have a dentist as she does not have any natural teeth   Patient Active Problem List   Diagnosis Date Noted  . 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[place])   . Acute blood loss as cause of postoperative anemia   . Critical limb ischemia with history of  revascularization of same extremity 05/08/2018  . Peripheral arterial disease ([place]) 04/28/2018  . Obesity 07/08/2016  . Essential hypertension 07/08/2016  . Pre-diabetes 06/24/2016  . Uterine fibroid   . Vitamin D deficiency   . Atypical chest pain     Past Medical History:  Diagnosis Date  . Acute blood loss as cause of postoperative anemia   . Chest pain    a. prior coronary CT without CAD.  Marland Kitchen Essential hypertension 07/08/2016  . GERD (gastroesophageal reflux disease)   . PAD (peripheral artery disease) ([place])    a. 05/2018: PV angio subacute thrombotic occlusion of her popliteal artery. She underwent successful penumbra aspiration thrombectomy, PTA and self-expanding stenting using overlapping Tigris self-expanding stents of a long thrombotic occlusion of the popliteal, anterior tibial and tibioperoneal trunk. Procedure c/b pseudoaneurysm/ABL anemia.  . 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[place])   . Uterine fibroid   . Vitamin D deficiency     Past Surgical History:  Procedure Laterality Date  . ABDOMINAL AORTOGRAM W/LOWER EXTREMITY Right 05/08/2018   Procedure: ABDOMINAL AORTOGRAM W/LOWER EXTREMITY;  Surgeon: Lorretta Harp, MD;  Location: [facility];  Service: Cardiovascular;  Laterality: Right;  . lump removed from breast at age 67    . ovarian cyst removed at 53yr old    . PERIPHERAL VASCULAR INTERVENTION Right 05/08/2018   Procedure: PERIPHERAL VASCULAR  INTERVENTION;  Surgeon: Lorretta Harp, MD;  Location: [facility];  Service: Cardiovascular;  Laterality: Right;  Anterior tibial and popliteal stents  . PERIPHERAL VASCULAR THROMBECTOMY Right 05/08/2018   Procedure: PERIPHERAL VASCULAR THROMBECTOMY;  Surgeon: Lorretta Harp, MD;  Location: [facility];  Service: Cardiovascular;  Laterality: Right;  Popliteal, tibioperoneal trunk, Anterior tibial, Posterior tibial    Social History   Tobacco Use  . Smoking status: Never Smoker  . Smokeless tobacco:  Never Used  Substance Use Topics  . Alcohol use: No  . Drug use: No    Family History  Problem Relation Age of Onset  . Hypertension Mother   . Dementia Father   . Colon polyps Neg Hx   . Crohn's disease Neg Hx   . Rectal cancer Neg Hx   . Stomach cancer Neg Hx   . Pancreatic cancer Neg Hx     Allergies  Allergen Reactions  . Lisinopril Cough    Medication list has been reviewed and updated.    Review of Systems:  She has not been sick, no fever   Physical Examination: There were no vitals filed for this visit. There were no vitals filed for this visit. There is no height or weight on file to calculate BMI. Ideal Body Weight:    She is not checking vital signs at home Patient observed over WebCam.  She appears to be her normal self, no distress, wheezing, coughing, tachypnea is apparent She points out to me the distribution of her hand symptoms  Assessment and Plan: Carpal tunnel syndrome on right  Pain in gums  Christina [name] describes what sounds like carpal tunnel syndrome in her right hand.  We discussed strategies to combat this.  I have encouraged her to try an over-the-counter wrist splint to prevent flexion of the wrist.  She is to wear this at night, and can wear during the day when practical to do so. If this is not helpful within a couple of weeks, she will let me know.  She will seek care sooner for any worsening, or if any other neurological symptoms  I suspect her recent gum pain may be due to wearing dentures that are quite old.  I wonder if her gums may have changed since she was fit for these dentures about 20 years ago.  I did invite her to come into the office today for an exam, but she declines to do this as her symptoms are currently resolved.  She will come in if her symptoms return  I did encourage her to consult with a dentist when she is able, she may need to look into a new set of dentures  Signed Lamar Blinks, MD  Copy of pt  instructions: I think you likely have carpal tunnel syndrome -as we discussed, this is caused by compression of the median nerve as it passes through a narrow channel in your wrist called the carpal tunnel. Wearing a splint which prevents you from bending the wrist forward will oftentimes relieve this issue.  Wear the splint at night while you are sleeping, you may also wear during the day when practical to do so  Please do let me know if this strategy does not get your wrist feeling better soon- in a couple of weeks   If you have gum pain again, let us plan to visit in the office.  Otherwise, once the current pandemic is past I would encourage you to consult with a dentist.  It may be that your gums have changed some over the years, and you may benefit from a new set of dentures  Take care, let me know if you need anything

## 2018-08-14 ENCOUNTER — Other Ambulatory Visit: Payer: Self-pay

## 2018-08-14 ENCOUNTER — Ambulatory Visit (INDEPENDENT_AMBULATORY_CARE_PROVIDER_SITE_OTHER): Payer: BLUE CROSS/BLUE SHIELD | Admitting: Family Medicine

## 2018-08-14 ENCOUNTER — Encounter: Payer: Self-pay | Admitting: Family Medicine

## 2018-08-14 ENCOUNTER — Ambulatory Visit: Payer: Self-pay | Admitting: Family Medicine

## 2018-08-14 DIAGNOSIS — G5601 Carpal tunnel syndrome, right upper limb: Secondary | ICD-10-CM | POA: Diagnosis not present

## 2018-08-14 DIAGNOSIS — K068 Other specified disorders of gingiva and edentulous alveolar ridge: Secondary | ICD-10-CM | POA: Diagnosis not present

## 2018-08-14 NOTE — Patient Instructions (Addendum)
I think you likely have carpal tunnel syndrome -as we discussed, this is caused by compression of the median nerve as it passes through a narrow channel in your wrist called the carpal tunnel. Wearing a splint which prevents you from bending the wrist forward will oftentimes relieve this issue.  Wear the splint at night while you are sleeping, you may also wear during the day when practical to do so  Please do let me know if this strategy does not get your wrist feeling better soon- in a couple of weeks   If you have gum pain again, let us plan to visit in the office.  Otherwise, once the current pandemic is past I would encourage you to consult with a dentist.  It may be that your gums have changed some over the years, and you may benefit from a new set of dentures  Take care, let me know if you need anything

## 2018-08-22 ENCOUNTER — Telehealth: Payer: Self-pay | Admitting: Pharmacist

## 2018-08-22 NOTE — Telephone Encounter (Signed)
LMOM and myChart . Patient to contact us schdeule f/u telephone visit for HTN management.

## 2018-08-23 ENCOUNTER — Telehealth: Payer: Self-pay | Admitting: *Deleted

## 2018-08-23 ENCOUNTER — Encounter: Payer: Self-pay | Admitting: *Deleted

## 2018-08-23 NOTE — Telephone Encounter (Signed)
Spoke with pt, she is c/o pain that starts under the right breast, slowly goes up into her chest, jaw and eventually into her right shoulder and back. This has been going on for about 5-9 years by her report and no one can find the cause. She reports they are lasting longer about 15 min. There is nothing she can do to make the discomfort worse or better. Prior to today she can not tell me when the last episode was or how often they are occurring. Explained to the patient do not think related to her heart as her CTA showed calcium score of 0. She would like to talk with dr berry. Video visit scheduled for later this week.

## 2018-08-23 NOTE — Telephone Encounter (Signed)
Cardiac Questionnaire:    Since your last visit or hospitalization:    1. Have you been having new or worsening chest pain? YES   2. Have you been having new or worsening shortness of breath? NO 3. Have you been having new or worsening leg swelling, wt gain, or increase in abdominal girth (pants fitting more tightly)? NO   4. Have you had any passing out spells? NO    *A YES to any of these questions would result in the appointment being kept. *If all the answers to these questions are NO, we should indicate that given the current situation regarding the worldwide coronarvirus pandemic, at the recommendation of the CDC, we are looking to limit gatherings in our waiting area, and thus will reschedule their appointment beyond four weeks from today.   _____________   [id number] Pre-Screening Questions:  . Do you currently have a fever? NO . Have you recently travelled on a cruise, internationally, or to [place], [place], [place], [place], [place], or [employer] [employer]) ? NO . Have you been in contact with someone that is currently pending confirmation of Covid19 testing or has been confirmed to have the [place] virus?  NO . Are you currently experiencing fatigue or cough? NO          Virtual Visit Pre-Appointment Phone Call  "(Name), I am calling you today to discuss your upcoming appointment. We are currently trying to limit exposure to the virus that causes COVID-19 by seeing patients at home rather than in the office."  1. "What is the BEST phone number to call the day of the visit?" - include this in appointment notes  2. "Do you have or have access to (through a family member/friend) a smartphone with video capability that we can use for your visit?" a. If yes - list this number in appt notes as "cell" (if different from BEST phone #) and list the appointment type as a VIDEO visit in appointment notes b. If no - list the appointment type as a PHONE visit in appointment notes  3. Confirm  consent - "In the setting of the current Covid19 crisis, you are scheduled for a (phone or video) visit with your provider on (date) at (time).  Just as we do with many in-office visits, in order for you to participate in this visit, we must obtain consent.  If you'd like, I can send this to your mychart (if signed up) or email for you to review.  Otherwise, I can obtain your verbal consent now.  All virtual visits are billed to your insurance company just like a normal visit would be.  By agreeing to a virtual visit, we'd like you to understand that the technology does not allow for your provider to perform an examination, and thus may limit your provider's ability to fully assess your condition. If your provider identifies any concerns that need to be evaluated in person, we will make arrangements to do so.  Finally, though the technology is pretty good, we cannot assure that it will always work on either your or our end, and in the setting of a video visit, we may have to convert it to a phone-only visit.  In either situation, we cannot ensure that we have a secure connection.  Are you willing to proceed?" STAFF: Did the patient verbally acknowledge consent to telehealth visit? Document YES/NO here: YES  4. Advise patient to be prepared - "Two hours prior to your appointment, go ahead and check  your blood pressure, pulse, oxygen saturation, and your weight (if you have the equipment to check those) and write them all down. When your visit starts, your provider will ask you for this information. If you have an Apple Watch or Kardia device, please plan to have heart rate information ready on the day of your appointment. Please have a pen and paper handy nearby the day of the visit as well."  5. Give patient instructions for MyChart download to smartphone OR Doximity/Doxy.me as below if video visit (depending on what platform provider is using)  6. Inform patient they will receive a phone call 15 minutes prior  to their appointment time (may be from unknown caller ID) so they should be prepared to answer    TELEPHONE CALL NOTE  [name] has been deemed a candidate for a follow-up tele-health visit to limit community exposure during the Covid-19 pandemic. I spoke with the patient via phone to ensure availability of phone/video source, confirm preferred email & phone number, and discuss instructions and expectations.  I reminded Christina [name] to be prepared with any vital sign and/or heart rhythm information that could potentially be obtained via home monitoring, at the time of her visit. I reminded Christina [name] to expect a phone call prior to her visit.  Christina [name], [place] 08/23/2018 4:24 PM   INSTRUCTIONS FOR DOWNLOADING THE MYCHART APP TO SMARTPHONE  - The patient must first make sure to have activated MyChart and know their login information - If Apple, go to [employer] and type in MyChart in the search bar and download the app. If Android, ask patient to go to [employer] and type in [place] in the search bar and download the app. The app is free but as with any other app downloads, their phone may require them to verify saved payment information or Apple/Android password.  - The patient will need to then log into the app with their MyChart username and password, and select [facility] as their healthcare provider to link the account. When it is time for your visit, go to the MyChart app, find appointments, and click Begin Video Visit. Be sure to Select Allow for your device to access the Microphone and Camera for your visit. You will then be connected, and your provider will be with you shortly.  **If they have any issues connecting, or need assistance please contact MyChart service desk [phone])**  **If using a computer, in order to ensure the best quality for their visit they will need to use either of the following Internet Browsers: [employer], or Google  Chrome**  IF USING DOXIMITY or DOXY.ME - The patient will receive a link just prior to their visit by text.     FULL LENGTH CONSENT FOR TELE-HEALTH VISIT   I hereby voluntarily request, consent and authorize [facility] and its employed or contracted physicians, physician assistants, nurse practitioners or other licensed health care professionals (the Practitioner), to provide me with telemedicine health care services (the "Services") as deemed necessary by the treating Practitioner. I acknowledge and consent to receive the Services by the Practitioner via telemedicine. I understand that the telemedicine visit will involve communicating with the Practitioner through live audiovisual communication technology and the disclosure of certain medical information by electronic transmission. I acknowledge that I have been given the opportunity to request an in-person assessment or other available alternative prior to the telemedicine visit and am voluntarily participating in the telemedicine visit.  I understand that  I have the right to withhold or withdraw my consent to the use of telemedicine in the course of my care at any time, without affecting my right to future care or treatment, and that the Practitioner or I may terminate the telemedicine visit at any time. I understand that I have the right to inspect all information obtained and/or recorded in the course of the telemedicine visit and may receive copies of available information for a reasonable fee.  I understand that some of the potential risks of receiving the Services via telemedicine include:  Marland Kitchen Delay or interruption in medical evaluation due to technological equipment failure or disruption; . Information transmitted may not be sufficient (e.g. poor resolution of images) to allow for appropriate medical decision making by the Practitioner; and/or  . In rare instances, security protocols could fail, causing a breach of personal health  information.  Furthermore, I acknowledge that it is my responsibility to provide information about my medical history, conditions and care that is complete and accurate to the best of my ability. I acknowledge that Practitioner's advice, recommendations, and/or decision may be based on factors not within their control, such as incomplete or inaccurate data provided by me or distortions of diagnostic images or specimens that may result from electronic transmissions. I understand that the practice of medicine is not an exact science and that Practitioner makes no warranties or guarantees regarding treatment outcomes. I acknowledge that I will receive a copy of this consent concurrently upon execution via email to the email address I last provided but may also request a printed copy by calling the office of [facility].    I understand that my insurance will be billed for this visit.   I have read or had this consent read to me. . I understand the contents of this consent, which adequately explains the benefits and risks of the Services being provided via telemedicine.  . I have been provided ample opportunity to ask questions regarding this consent and the Services and have had my questions answered to my satisfaction. . I give my informed consent for the services to be provided through the use of telemedicine in my medical care  By participating in this telemedicine visit I agree to the above.   Spoke with patient and she agreed to have a video visit with Dr. Gwenlyn Found and August 25, 2018. We reviewed her medications, allergies, pharmacy, and family history.

## 2018-08-24 ENCOUNTER — Telehealth: Payer: Self-pay | Admitting: Cardiovascular Disease

## 2018-08-24 NOTE — Telephone Encounter (Signed)
Smartphone/ my chart/ virtual consent/ pre reg completed 08/24/18 [place]

## 2018-08-25 ENCOUNTER — Other Ambulatory Visit: Payer: Self-pay | Admitting: Family Medicine

## 2018-08-25 ENCOUNTER — Telehealth: Payer: Self-pay | Admitting: Cardiovascular Disease

## 2018-08-25 ENCOUNTER — Telehealth (INDEPENDENT_AMBULATORY_CARE_PROVIDER_SITE_OTHER): Payer: BLUE CROSS/BLUE SHIELD | Admitting: Cardiovascular Disease

## 2018-08-25 ENCOUNTER — Telehealth: Payer: Self-pay

## 2018-08-25 DIAGNOSIS — E782 Mixed hyperlipidemia: Secondary | ICD-10-CM

## 2018-08-25 DIAGNOSIS — Z1231 Encounter for screening mammogram for malignant neoplasm of breast: Secondary | ICD-10-CM

## 2018-08-25 DIAGNOSIS — I70229 Atherosclerosis of native arteries of extremities with rest pain, unspecified extremity: Secondary | ICD-10-CM

## 2018-08-25 DIAGNOSIS — I998 Other disorder of circulatory system: Secondary | ICD-10-CM

## 2018-08-25 DIAGNOSIS — R0789 Other chest pain: Secondary | ICD-10-CM

## 2018-08-25 DIAGNOSIS — Z959 Presence of cardiac and vascular implant and graft, unspecified: Secondary | ICD-10-CM

## 2018-08-25 DIAGNOSIS — I10 Essential (primary) hypertension: Secondary | ICD-10-CM | POA: Diagnosis not present

## 2018-08-25 DIAGNOSIS — Z9889 Other specified postprocedural states: Secondary | ICD-10-CM

## 2018-08-25 NOTE — Telephone Encounter (Signed)
Patient and/or DPR-approved person aware of AVS instructions and verbalized understanding. Letter including After Visit Summary and any other necessary documents to be mailed to the patient's address on file.  

## 2018-08-25 NOTE — Telephone Encounter (Signed)
Patient was on the recall list for 6 mo f/u with Dr. Acie Fredrickson.  She was seen today by Dr. Alvester Chou for 6 mo f/u.  I deleted her from the recall list.

## 2018-08-25 NOTE — Patient Instructions (Signed)
Medication Instructions:  Your physician recommends that you continue on your current medications as directed. Please refer to the Current Medication list given to you today.  If you need a refill on your cardiac medications before your next appointment, please call your pharmacy.   Lab work: Your physician recommends that you return for lab work within 1-2 weeks: FASTING LIPID PROFILE AND LIVER FUNCTION TEST. YOU WILL RECEIVE A LAB SLIP IN THE MAIL. PLEASE DO NOT EAT OR DRINK (EXCEPT WATER) ANYTHING ON THE DAY YOU CHOOSE TO PRESENT FOR LAB WORK. NO APPOINTMENT IS NEEDED.   If you have labs (blood work) drawn today and your tests are completely normal, you will receive your results only by: Marland Kitchen MyChart Message (if you have MyChart) OR . A paper copy in the mail If you have any lab test that is abnormal or we need to change your treatment, we will call you to review the results.  Testing/Procedures: NONE  Follow-Up: At [facility], you and your health needs are our priority.  As part of our continuing mission to provide you with exceptional heart care, we have created designated Provider Care Teams.  These Care Teams include your primary Cardiologist (physician) and Advanced Practice Providers (APPs -  Physician Assistants and Nurse Practitioners) who all work together to provide you with the care you need, when you need it. . You will need a follow up appointment in 3 months with an APP and in 6 months with Dr. Gwenlyn Found.  Please call our office 2 months in advance to schedule this appointment.  You may see one of the following Advanced Practice Providers on your designated Care Team:   . Kerin Ransom, [place] . Almyra Deforest, PA-C . Fabian Sharp, PA-C . Jory Sims, DNP . Rosaria Ferries, PA-C . Roby Lofts, PA-C . Sande Rives, PA-C

## 2018-08-25 NOTE — Progress Notes (Signed)
Virtual Visit via Video Note   This visit type was conducted due to national recommendations for restrictions regarding the COVID-19 Pandemic (e.g. social distancing) in an effort to limit this patient's exposure and mitigate transmission in our community.  Due to her co-morbid illnesses, this patient is at least at moderate risk for complications without adequate follow up.  This format is felt to be most appropriate for this patient at this time.  All issues noted in this document were discussed and addressed.  A limited physical exam was performed with this format.  Please refer to the patient's chart for her consent to telehealth for [facility].   Evaluation Performed:  Follow-up visit  Date:  08/25/2018   ID:  Christina [name], DOB 01/15/1966, MRN [id number]  Patient Location: Home Provider Location: Home  PCP:  [place], Gay Filler, MD  Cardiologist:  Quay Burow, MD  Electrophysiologist:  None   Chief Complaint: Atypical chest pain  History of Present Illness:    Christina [name] is a 53 y.o.  severely overweight single African-American female mother of 1 child, grandmother and 3 grandchildren who works Chief Operating Officer at [employer]. She was referred by Dr. Acie Fredrickson for peripheral vascular evaluation because of fairly recent onset right calf claudication which is lifestyle limiting.  I last saw her in the office 05/31/2018.She has a history of hypertension. She has chronic chest pain with a recent negative Myoview and a negative coronary CTA 1 year ago. She had fairly recent onset right calf pain approxi-1 month ago that occurred when she woke up 1 morning and has been fairly persistent with ambulation. She had lower extremity arterial Doppler studies performed 04/28/2018 revealing a right ABI 0.61 with an occluded right popliteal artery.  I performed peripheral angiography 05/08/2018 revealing occluded above-knee popliteal artery extending down below the knee into the proximal  anterior tibial and tibioperoneal trunk.  She had a long complex procedure with penumbra aspiration thrombectomy followed by balloon angioplasty and implantation of 2 overlapping Tigris self-expanding stents beginning in the popliteal artery extending down into the anterior tibial artery.  Unfortunately her tibioperoneal trunk remains occluded.  Her Dopplers normalized and her pain is resolved.  She did unfortunately have a left common femoral pseudoaneurysm and ultimately underwent ultrasound-guided thrombin injection and successfully was closed.  Since I saw her 3 months ago her leg continues to feel good.  She is on Eliquis and clopidogrel.  Dopplers performed 05/26/2018 revealed a right ABI of 1.24 with a patent stent.  She does continue to complain of atypical chest pain beginning under her right breast and going into her neck and jaw.  She had a negative Myoview stress test in December of last year and a coronary CTA revealing a coronary calcium score of 0 with normal coronary arteries.  In addition, she is taken sublingual nitroglycerin for this which does not work.  I reassured her that I do not think this is related to coronary artery disease.  The patient does not have symptoms concerning for COVID-19 infection (fever, chills, cough, or new shortness of breath).    Past Medical History:  Diagnosis Date  . Acute blood loss as cause of postoperative anemia   . Chest pain    a. prior coronary CT without CAD.  Marland Kitchen Essential hypertension 07/08/2016  . GERD (gastroesophageal reflux disease)   . PAD (peripheral artery disease) (Post)    a. 05/2018: PV angio subacute thrombotic occlusion of her popliteal artery. She underwent successful penumbra aspiration  thrombectomy, PTA and self-expanding stenting using overlapping Tigris self-expanding stents of a long thrombotic occlusion of the popliteal, anterior tibial and tibioperoneal trunk. Procedure c/b pseudoaneurysm/ABL anemia.  . Pseudoaneurysm following  procedure ([place])   . Uterine fibroid   . Vitamin D deficiency    Past Surgical History:  Procedure Laterality Date  . ABDOMINAL AORTOGRAM W/LOWER EXTREMITY Right 05/08/2018   Procedure: ABDOMINAL AORTOGRAM W/LOWER EXTREMITY;  Surgeon: Lorretta Harp, MD;  Location: [facility];  Service: Cardiovascular;  Laterality: Right;  . lump removed from breast at age 10    . ovarian cyst removed at 53yr old    . PERIPHERAL VASCULAR INTERVENTION Right 05/08/2018   Procedure: PERIPHERAL VASCULAR INTERVENTION;  Surgeon: Lorretta Harp, MD;  Location: [facility];  Service: Cardiovascular;  Laterality: Right;  Anterior tibial and popliteal stents  . PERIPHERAL VASCULAR THROMBECTOMY Right 05/08/2018   Procedure: PERIPHERAL VASCULAR THROMBECTOMY;  Surgeon: Lorretta Harp, MD;  Location: [facility];  Service: Cardiovascular;  Laterality: Right;  Popliteal, tibioperoneal trunk, Anterior tibial, Posterior tibial     Current Meds  Medication Sig  . apixaban (ELIQUIS) 5 MG TABS tablet Take 1 tablet (5 mg total) by mouth 2 (two) times daily.  Marland Kitchen atorvastatin (LIPITOR) 80 MG tablet Take 1 tablet (80 mg total) by mouth every evening.  . carvedilol (COREG) 6.25 MG tablet Take 1 tablet (6.25 mg total) by mouth 2 (two) times daily.  . clopidogrel (PLAVIX) 75 MG tablet Take 1 tablet (75 mg total) by mouth daily.  . ferrous gluconate (FERGON) 324 MG tablet Take 1 tablet by mouth once daily with breakfast  . megestrol (MEGACE) 40 MG tablet Take 1 tablet (40 mg total) by mouth 2 (two) times daily. 40 mg daily. May take additional 40 mg in the event of heavy bleeding. (Patient taking differently: Take 40 mg by mouth See admin instructions. Take 1 tablet (40 mg) by mouth scheduled once daily. May take additional 40 mg in the event of heavy bleeding.)  . potassium chloride (K-DUR,KLOR-CON) 10 MEQ tablet Take 1 tablet by mouth once daily  . valsartan (DIOVAN) 160 MG tablet Take 1 tablet (160 mg total)  by mouth daily. TO REPLACE LOSARTAN     Allergies:   Lisinopril   Social History   Tobacco Use  . Smoking status: Never Smoker  . Smokeless tobacco: Never Used  Substance Use Topics  . Alcohol use: No  . Drug use: No     Family Hx: The patient's family history includes Dementia in her father; Hypertension in her mother. There is no history of Colon polyps, Crohn's disease, Rectal cancer, Stomach cancer, or Pancreatic cancer.  ROS:   Please see the history of present illness.     All other systems reviewed and are negative.   Prior CV studies:   The following studies were reviewed today:  Peripheral arterial Dopplers, Myoview stress test, coronary CTA  Labs/Other Tests and Data Reviewed:    EKG:  An ECG dated 05/08/2018 was personally reviewed today and demonstrated:  Normal sinus rhythm at 83 with LVH voltage and nonspecific ST and T wave changes  Recent Labs: 04/03/2018: ALT 16 05/02/2018: TSH 0.769 06/29/2018: Hemoglobin 13.3; Platelets 259.0 07/14/2018: BUN 14; Creatinine, Ser 0.92; Potassium 4.5; Sodium 140   Recent Lipid Panel Lab Results  Component Value Date/Time   CHOL 174 05/02/2017 10:21 AM   TRIG 95 05/02/2017 10:21 AM   HDL 56 05/02/2017 10:21 AM   CHOLHDL  3.1 05/02/2017 10:21 AM   LDLCALC 99 05/02/2017 10:21 AM    Wt Readings from Last 3 Encounters:  08/25/18 286 lb (129.7 kg)  07/27/18 287 lb (130.2 kg)  06/29/18 287 lb (130.2 kg)     Objective:    Vital Signs:  BP 138/84   Pulse 83   Ht 5\' 7"  (1.702 m)   Wt 286 lb (129.7 kg)   BMI 44.79 kg/m    VITAL SIGNS:  reviewed GEN:  no acute distress RESPIRATORY:  normal respiratory effort, symmetric expansion NEURO:  alert and oriented x 3, no obvious focal deficit PSYCH:  normal affect  ASSESSMENT & PLAN:    1. Peripheral arterial disease- history of PAD status post complex intervention of an occluded right popliteal artery and tibioperoneal trunk by myself on 05/08/2018 placing several  self-expanding stents.  There was excessive amount of subacute thrombus and she underwent mechanical and chemical thrombectomy.  Her follow-up Dopplers performed 05/26/2018 revealed a right ABI of 1.24 with a widely patent stent.  She denies medication.  She remains on Eliquis and clopidogrel. 2. Essential hypertension- history of essential hypertension with blood pressure measured by the patient at home at 111/83 with pulse of 82.  She is on carvedilol and losartan 3. Hyperlipidemia- history of hyperlipidemia on atorvastatin with fasting lipid profile performed 04/04/2017 revealing total cholesterol of 174, LDL of 99 and HDL 56 4. Atypical chest pain- history of atypical chest pain beginning 9 years ago.  It begins under her right breast and radiates to her neck and jaw.  She has had a GI work-up.  A Myoview stress test performed 04/20/2018 was nonischemic and a coronary CTA performed 03/07/2017 showed a coronary calcium score of 0 with normal coronary arteries.  In addition, she has taken sublingual nitroglycerin without benefit.  I have reassured her that I think the likelihood of this is ischemically mediated is remote.  COVID-19 Education: The signs and symptoms of COVID-19 were discussed with the patient and how to seek care for testing (follow up with PCP or arrange E-visit).  The importance of social distancing was discussed today.  Time:   Today, I have spent 15 minutes with the patient with telehealth technology discussing the above problems.     Medication Adjustments/Labs and Tests Ordered: Current medicines are reviewed at length with the patient today.  Concerns regarding medicines are outlined above.   Tests Ordered: No orders of the defined types were placed in this encounter.   Medication Changes: No orders of the defined types were placed in this encounter.   Disposition:  Follow up in 3 month(s)  Signed, Quay Burow, MD  08/25/2018 10:42 AM    [place]

## 2018-08-26 ENCOUNTER — Other Ambulatory Visit: Payer: Self-pay | Admitting: Cardiovascular Disease

## 2018-08-28 ENCOUNTER — Other Ambulatory Visit: Payer: Self-pay

## 2018-08-28 ENCOUNTER — Encounter: Payer: Self-pay | Admitting: *Deleted

## 2018-08-28 ENCOUNTER — Telehealth: Payer: Self-pay | Admitting: Gastroenterology

## 2018-08-28 MED ORDER — VALSARTAN 160 MG PO TABS: 160.0000 mg | ORAL_TABLET | Freq: Every day | ORAL | 3 refills | Status: DC

## 2018-08-28 NOTE — Telephone Encounter (Signed)
This encounter was created in error - please disregard.

## 2018-08-28 NOTE — Telephone Encounter (Signed)
Called hospital and they are still on hold for Esophageal Manometry until at least June because of COVID-19. I called patient and told her. She will check back in June

## 2018-08-28 NOTE — Telephone Encounter (Signed)
Valsartan 160 mg refilled. 

## 2018-08-29 MED ORDER — POTASSIUM CHLORIDE CRYS ER 10 MEQ PO TBCR: 10.0000 meq | EXTENDED_RELEASE_TABLET | Freq: Every day | ORAL | 0 refills | Status: DC

## 2018-09-05 DIAGNOSIS — E782 Mixed hyperlipidemia: Secondary | ICD-10-CM | POA: Diagnosis not present

## 2018-09-06 LAB — HEPATIC FUNCTION PANEL
ALT: 24 IU/L (ref 0–32)
AST: 15 IU/L (ref 0–40)
Albumin: 4.1 g/dL (ref 3.8–4.9)
Alkaline Phosphatase: 120 IU/L — ABNORMAL HIGH (ref 39–117)
Bilirubin Total: 0.3 mg/dL (ref 0.0–1.2)
Bilirubin, Direct: 0.1 mg/dL (ref 0.00–0.40)
Total Protein: 7 g/dL (ref 6.0–8.5)

## 2018-09-06 LAB — LIPID PANEL
Chol/HDL Ratio: 2.8 ratio (ref 0.0–4.4)
Cholesterol, Total: 119 mg/dL (ref 100–199)
HDL: 43 mg/dL (ref >39)
LDL Calculated: 55 mg/dL (ref 0–99)
Triglycerides: 106 mg/dL (ref 0–149)
VLDL Cholesterol Cal: 21 mg/dL (ref 5–40)

## 2018-09-11 ENCOUNTER — Encounter: Payer: Self-pay | Admitting: Family Medicine

## 2018-09-12 ENCOUNTER — Ambulatory Visit: Payer: Self-pay | Admitting: Orthopedic Surgery

## 2018-09-20 ENCOUNTER — Telehealth: Payer: Self-pay | Admitting: Pharmacist

## 2018-09-20 ENCOUNTER — Encounter: Payer: Self-pay | Admitting: Family Medicine

## 2018-09-20 DIAGNOSIS — R7989 Other specified abnormal findings of blood chemistry: Secondary | ICD-10-CM

## 2018-09-20 MED ORDER — FERROUS GLUCONATE 324 (38 FE) MG PO TABS: 324.0000 mg | ORAL_TABLET | ORAL | 3 refills | Status: DC

## 2018-09-20 MED ORDER — ATORVASTATIN CALCIUM 80 MG PO TABS: 40.0000 mg | ORAL_TABLET | Freq: Every evening | ORAL | 6 refills | Status: DC

## 2018-09-20 MED ORDER — POTASSIUM CHLORIDE CRYS ER 10 MEQ PO TBCR: 10.0000 meq | EXTENDED_RELEASE_TABLET | ORAL | 0 refills | Status: DC

## 2018-09-20 NOTE — Addendum Note (Signed)
Addended by: Lamar Blinks C on: 09/20/2018 01:42 PM   Modules accepted: Orders

## 2018-09-20 NOTE — Telephone Encounter (Signed)
Patient requesting discontinuation of some medication if possible to decrease daily "pill burden".  *Telephone follow - up for Medication management requested by Dr Gwenlyn Found*  HPI: 53yo female with PMH including hypertension, PAD s/p stent placement and thrombectomy performed 05-08-2018, hyperlipidemia, and iron deficiency anemia.  Patient will like to stop taking "all her medication" and is very worried about her liver function test.  Current medication:  Apixaban 5mg  BID  Atorvastatin 80mg  daily  Carvedilol 6.25mg  BID  Clopidogrel 75mg  daily  Ferrous gluconate 324mg  daily  Megestrol 40mg  BID  Potassium CL 24mEq daily  Valsartan 160mg  daily   Assessment and Plan:  Discussed all patient's concerns and indication for each medication. Blood work results discussed as well.  Will decrease atorvastatin dose to 40mg  daily, ferrous gluconate to 3x/week, and potassium chloride to 3x/week. All other medication will stay as previously prescribed.  She understand need to avoid fatty food the evening before fasting blood work. Will follow up HTN in office in 2 months   Thao Vanover Rodriguez-Guzman PharmD, BCPS, [place] [address] [place] [address] 09/20/2018 10:47 AM

## 2018-09-29 ENCOUNTER — Telehealth: Payer: Self-pay

## 2018-09-29 ENCOUNTER — Encounter: Payer: Self-pay | Admitting: Family Medicine

## 2018-09-29 ENCOUNTER — Ambulatory Visit (INDEPENDENT_AMBULATORY_CARE_PROVIDER_SITE_OTHER): Payer: BLUE CROSS/BLUE SHIELD | Admitting: Family Medicine

## 2018-09-29 ENCOUNTER — Telehealth: Payer: Self-pay | Admitting: Family Medicine

## 2018-09-29 ENCOUNTER — Other Ambulatory Visit: Payer: Self-pay

## 2018-09-29 VITALS — BP 138/98 | HR 61 | Ht 67.0 in | Wt 284.4 lb

## 2018-09-29 DIAGNOSIS — R7303 Prediabetes: Secondary | ICD-10-CM

## 2018-09-29 DIAGNOSIS — R7989 Other specified abnormal findings of blood chemistry: Secondary | ICD-10-CM | POA: Diagnosis not present

## 2018-09-29 DIAGNOSIS — E559 Vitamin D deficiency, unspecified: Secondary | ICD-10-CM | POA: Diagnosis not present

## 2018-09-29 LAB — CBC WITH DIFFERENTIAL/PLATELET
Absolute Monocytes: 830 cells/uL (ref 200–950)
Basophils Absolute: 42 cells/uL (ref 0–200)
Basophils Relative: 0.5 %
Eosinophils Absolute: 149 cells/uL (ref 15–500)
Eosinophils Relative: 1.8 %
HCT: 43.9 % (ref 35.0–45.0)
Hemoglobin: 14.5 g/dL (ref 11.7–15.5)
Lymphs Abs: 1942 cells/uL (ref 850–3900)
MCH: 29.6 pg (ref 27.0–33.0)
MCHC: 33 g/dL (ref 32.0–36.0)
MCV: 89.6 fL (ref 80.0–100.0)
MPV: 10.8 fL (ref 7.5–12.5)
Monocytes Relative: 10 %
Neutro Abs: 5337 cells/uL (ref 1500–7800)
Neutrophils Relative %: 64.3 %
Platelets: 266 10*3/uL (ref 140–400)
RBC: 4.9 10*6/uL (ref 3.80–5.10)
RDW: 14.2 % (ref 11.0–15.0)
Total Lymphocyte: 23.4 %
WBC: 8.3 10*3/uL (ref 3.8–10.8)

## 2018-09-29 LAB — RETICULOCYTES
ABS Retic: 93100 cells/uL — ABNORMAL HIGH (ref 20000–8000)
Retic Ct Pct: 1.9 %

## 2018-09-29 NOTE — Telephone Encounter (Signed)
Copied from [place] [phone]. Topic: Appointment Scheduling - Transfer of Care >> Sep 28, 2018  3:55 PM Mcneil, Jacinto Reap wrote: Pt is requesting to transfer FROM: Dr. Lorelei Pont Pt is requesting to transfer TO: Dr. Ethelene Hal Reason for requested transfer: patient stated she prefers to switch providers  Send CRM to patient's current PCP (transferring FROM).

## 2018-09-29 NOTE — Telephone Encounter (Signed)
I called and scheduled patient for appointment. Patient has been prescreened for COVID cleared to come into office.

## 2018-09-29 NOTE — Telephone Encounter (Signed)
Ok with me 

## 2018-09-29 NOTE — Telephone Encounter (Signed)
Waiting on Dr. Lorelei Pont response before calling patient.

## 2018-09-29 NOTE — Telephone Encounter (Signed)
Okay to schedule patient with Dr. Ethelene Hal.

## 2018-09-29 NOTE — Telephone Encounter (Signed)
Okay to transfer  

## 2018-09-29 NOTE — Addendum Note (Signed)
Addended by: Lynnea Ferrier on: 09/29/2018 03:48 PM   Modules accepted: Orders

## 2018-09-29 NOTE — Telephone Encounter (Signed)
I called and spoke to patient. Patient scheduled with Dr. Ethelene Hal for 09/29/2018.

## 2018-09-29 NOTE — Progress Notes (Addendum)
Established Patient Office Visit  Subjective:  Patient ID: Christina [name], female    DOB: 1965-06-30  Age: 53 y.o. MRN: [id number]  CC:  Chief Complaint  Patient presents with  . Establish Care    HPI Christina [name] presents for establishment of care and to meet her new doctor.  Significant past medical history to include hypertension, peripheral vascular disease vitamin D deficiency prediabetes obesity.  She is concerned about an alkaline phosphatase that was mildly elevated and her blood work.  She is scheduled for a CBC reticulocyte count.  She has a history of acute blood loss anemia that have been associated with a angioplasty of her right popliteal artery. She also has a history of large uterine fibroids and is scheduled for hysterectomy in January 2021.  She currently is taking Eliquis and Plavix.  She denies hematuria, hematochezia and melena.  Cologuard study was negative last year.  He takes [place] twice daily.  Review of chart reveals a normal CBC from February 27.  Past Medical History:  Diagnosis Date  . Acute blood loss as cause of postoperative anemia   . Chest pain    a. prior coronary CT without CAD.  Marland Kitchen Essential hypertension 07/08/2016  . GERD (gastroesophageal reflux disease)   . PAD (peripheral artery disease) ([place])    a. 05/2018: PV angio subacute thrombotic occlusion of her popliteal artery. She underwent successful penumbra aspiration thrombectomy, PTA and self-expanding stenting using overlapping [place] self-expanding stents of a long thrombotic occlusion of the popliteal, anterior tibial and tibioperoneal trunk. Procedure c/b pseudoaneurysm/ABL anemia.  . 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[place])   . Uterine fibroid   . Vitamin D deficiency     Past Surgical History:  Procedure Laterality Date  . ABDOMINAL AORTOGRAM W/LOWER EXTREMITY Right 05/08/2018   Procedure: ABDOMINAL AORTOGRAM W/LOWER EXTREMITY;  Surgeon: Lorretta Harp, MD;  Location: [facility];   Service: Cardiovascular;  Laterality: Right;  . lump removed from breast at age 56    . ovarian cyst removed at 53yr old    . PERIPHERAL VASCULAR INTERVENTION Right 05/08/2018   Procedure: PERIPHERAL VASCULAR INTERVENTION;  Surgeon: Lorretta Harp, MD;  Location: [facility];  Service: Cardiovascular;  Laterality: Right;  Anterior tibial and popliteal stents  . PERIPHERAL VASCULAR THROMBECTOMY Right 05/08/2018   Procedure: PERIPHERAL VASCULAR THROMBECTOMY;  Surgeon: Lorretta Harp, MD;  Location: [facility];  Service: Cardiovascular;  Laterality: Right;  Popliteal, tibioperoneal trunk, Anterior tibial, Posterior tibial    Family History  Problem Relation Age of Onset  . Hypertension Mother   . Dementia Father   . Colon polyps Neg Hx   . Crohn's disease Neg Hx   . Rectal cancer Neg Hx   . Stomach cancer Neg Hx   . Pancreatic cancer Neg Hx     Social History   Socioeconomic History  . Marital status: Single    Spouse name: Not on file  . Number of children: Not on file  . Years of education: Not on file  . Highest education level: Not on file  Occupational History  . Not on file  Social Needs  . Financial resource strain: Not on file  . Food insecurity    Worry: Not on file    Inability: Not on file  . Transportation needs    Medical: Not on file    Non-medical: Not on file  Tobacco Use  . Smoking status: Never Smoker  . Smokeless tobacco: Never Used  Substance and Sexual Activity  . Alcohol use: No  . Drug use: No  . Sexual activity: Yes  Lifestyle  . Physical activity    Days per week: Not on file    Minutes per session: Not on file  . Stress: Not on file  Relationships  . Social Herbalist on phone: Not on file    Gets together: Not on file    Attends religious service: Not on file    Active member of club or organization: Not on file    Attends meetings of clubs or organizations: Not on file    Relationship status: Not on file  .  Intimate partner violence    Fear of current or ex partner: Not on file    Emotionally abused: Not on file    Physically abused: Not on file    Forced sexual activity: Not on file  Other Topics Concern  . Not on file  Social History Narrative  . Not on file    Outpatient Medications Prior to Visit  Medication Sig Dispense Refill  . apixaban (ELIQUIS) 5 MG TABS tablet Take 1 tablet (5 mg total) by mouth 2 (two) times daily. 60 tablet 6  . atorvastatin (LIPITOR) 80 MG tablet Take 0.5 tablets (40 mg total) by mouth every evening. 30 tablet 6  . carvedilol (COREG) 6.25 MG tablet Take 1 tablet (6.25 mg total) by mouth 2 (two) times daily. 60 tablet 4  . clopidogrel (PLAVIX) 75 MG tablet Take 1 tablet (75 mg total) by mouth daily. 90 tablet 3  . ferrous gluconate (FERGON) 324 MG tablet Take 1 tablet (324 mg total) by mouth 3 (three) times a week. 90 tablet 3  . megestrol (MEGACE) 40 MG tablet Take 1 tablet (40 mg total) by mouth 2 (two) times daily. 40 mg daily. May take additional 40 mg in the event of heavy bleeding. (Patient taking differently: Take 40 mg by mouth See admin instructions. Take 1 tablet (40 mg) by mouth scheduled once daily. May take additional 40 mg in the event of heavy bleeding.) 60 tablet 5  . potassium chloride (K-DUR) 10 MEQ tablet Take 1 tablet (10 mEq total) by mouth 3 (three) times a week. 90 tablet 0  . valsartan (DIOVAN) 160 MG tablet Take 1 tablet (160 mg total) by mouth daily. TO REPLACE LOSARTAN 90 tablet 3   No facility-administered medications prior to visit.     Allergies  Allergen Reactions  . Lisinopril Cough    ROS Review of Systems  Constitutional: Negative.   Respiratory: Negative.   Cardiovascular: Negative.   Gastrointestinal: Negative.  Negative for abdominal pain, anal bleeding, blood in stool, nausea and vomiting.  Genitourinary: Negative for hematuria.      Objective:    Physical Exam  Constitutional: She is oriented to person, place,  and time. She appears well-developed and well-nourished. No distress.  HENT:  Head: Normocephalic and atraumatic.  Right Ear: External ear normal.  Left Ear: External ear normal.  Eyes: Right eye exhibits no discharge. Left eye exhibits no discharge. No scleral icterus.  Neck: No JVD present. No tracheal deviation present.  Pulmonary/Chest: Effort normal. No stridor.  Neurological: She is alert and oriented to person, place, and time.  Skin: She is not diaphoretic.  Psychiatric: She has a normal mood and affect. Her behavior is normal.    BP (!) 138/98   Pulse 61   Ht 5\' 7"  (1.702 m)   Wt 284 lb  6 oz (129 kg)   BMI 44.54 kg/m  Wt Readings from Last 3 Encounters:  10/20/18 281 lb (127.5 kg)  10/13/18 284 lb (128.8 kg)  09/29/18 284 lb 6 oz (129 kg)   BP Readings from Last 3 Encounters:  10/20/18 124/80  09/29/18 (!) 138/98  08/25/18 138/84   Guideline developer:  UpToDate (see UpToDate for funding source) Date Released: June 2014  Health Maintenance Due  Topic Date Due  . HIV Screening  01/31/1979    There are no preventive care reminders to display for this patient.  Lab Results  Component Value Date   TSH 0.769 05/02/2018   Lab Results  Component Value Date   WBC 8.3 09/29/2018   HGB 14.5 09/29/2018   HCT 43.9 09/29/2018   MCV 89.6 09/29/2018   PLT 266 09/29/2018   Lab Results  Component Value Date   NA 140 07/14/2018   K 4.5 07/14/2018   CO2 21 07/14/2018   GLUCOSE 90 07/14/2018   BUN 14 07/14/2018   CREATININE 0.92 07/14/2018   BILITOT 0.3 09/05/2018   ALKPHOS 120 (H) 09/05/2018   AST 15 09/05/2018   ALT 24 09/05/2018   PROT 7.0 09/05/2018   ALBUMIN 4.1 09/05/2018   CALCIUM 9.7 07/14/2018   ANIONGAP 7 05/10/2018   GFR 100.40 04/03/2018   Lab Results  Component Value Date   CHOL 119 09/05/2018   Lab Results  Component Value Date   HDL 43 09/05/2018   Lab Results  Component Value Date   LDLCALC 55 09/05/2018   Lab Results  Component  Value Date   TRIG 106 09/05/2018   Lab Results  Component Value Date   CHOLHDL 2.8 09/05/2018   Lab Results  Component Value Date   HGBA1C 5.7 (H) 05/02/2017      Assessment & Plan:   Problem List Items Addressed This Visit      Other   Vitamin D deficiency   Pre-diabetes - Primary    Other Visit Diagnoses    Abnormal CBC          No orders of the defined types were placed in this encounter.   Follow-up: Return in about 3 months (around 12/30/2018).    Spent 20 minutes with the patient reviewing the chart with her.  Reassured her about her minimally elevated alkaline phosphatase with normal ALT and AST and that we would follow it over time.  She does not drink smoke or use illicit drugs.  She will follow-up in 3 months or sooner if she has any concerns.

## 2018-09-30 ENCOUNTER — Encounter: Payer: Self-pay | Admitting: Family Medicine

## 2018-10-01 ENCOUNTER — Encounter: Payer: Self-pay | Admitting: Family Medicine

## 2018-10-06 ENCOUNTER — Ambulatory Visit: Payer: BLUE CROSS/BLUE SHIELD | Admitting: Family Medicine

## 2018-10-09 ENCOUNTER — Institutional Professional Consult (permissible substitution): Payer: BLUE CROSS/BLUE SHIELD | Admitting: Obstetrics & Gynecology

## 2018-10-12 ENCOUNTER — Telehealth: Payer: Self-pay | Admitting: General Surgery

## 2018-10-12 ENCOUNTER — Ambulatory Visit: Payer: BLUE CROSS/BLUE SHIELD | Admitting: Orthopedic Surgery

## 2018-10-12 NOTE — Progress Notes (Signed)
Prescreened pt for 6-12 visit with [facility].

## 2018-10-12 NOTE — Telephone Encounter (Signed)
Left a voicemail for the patient to contact the office to pre-screen for her virtual visit on 10/13/2018

## 2018-10-13 ENCOUNTER — Other Ambulatory Visit: Payer: Self-pay

## 2018-10-13 ENCOUNTER — Ambulatory Visit (INDEPENDENT_AMBULATORY_CARE_PROVIDER_SITE_OTHER): Payer: BLUE CROSS/BLUE SHIELD | Admitting: Gastroenterology

## 2018-10-13 ENCOUNTER — Encounter: Payer: Self-pay | Admitting: Gastroenterology

## 2018-10-13 VITALS — Ht 67.0 in | Wt 284.0 lb

## 2018-10-13 DIAGNOSIS — R0789 Other chest pain: Secondary | ICD-10-CM

## 2018-10-13 DIAGNOSIS — K224 Dyskinesia of esophagus: Secondary | ICD-10-CM

## 2018-10-13 NOTE — Progress Notes (Signed)
Virtual Visit via Telephone Note  I connected with Christina [name] on 10/13/18 at  4:00 PM EDT by telephone and verified that I am speaking with the correct person using two identifiers.  I discussed the limitations, risks, security and privacy concerns of performing an evaluation and management service by telephone and the availability of in person appointments. I also discussed with the patient that there may be a patient responsible charge related to this service. The patient expressed understanding and agreed to proceed.  THIS ENCOUNTER IS A VIRTUAL VISIT DUE TO COVID-19 - PATIENT WAS NOT SEEN IN THE OFFICE. PATIENT HAS CONSENTED TO VIRTUAL VISIT / TELEMEDICINE VISIT USING [employer] APP   Location of patient: home Location of provider: office Persons participating: myself, patient   HPI :  53 year old female here for a follow-up visit for atypical chest pain.  Pain ongoing since 2009 or so, > 10 years. Not much change since she was last here in December 2019. She feels it in her right upper side or upper abdomen that radiates into the bottom of her chest, rises into the middle of her chest and into her jaw on both side. This can also radiate into her back. Initially when it started it would last for 1-2 minutes at a time, notes lasting upwards of 30 minutes at a time. Occurs sporadically, she feels it at least every month although frequency can vary, she has more recently been having a few episodes every week. She does not have any known precipitants for these episodes, and often occurs when she is sitting at work. She denies any exertional symptoms. She denies any heartburn, she denies any symptoms related to eating. No dysphagia. She's had a few visits to the ED for severe pain previously.  She's had cardiology workup to include EKG, cardiac CT scan 03/2017, echocardiogram 03/2017. Most recently she had a negative nuclear stress in December 2019, she states her cardiologist has told her she  definitively does not have cardiac chest pain. She's had an ultrasound of the right upper quadrant December 2018 which was normal. EGD done August 2019, she had a small 1 cm hiatal hernia, with patchy erythema of the stomach, otherwise normal. Biopsies of the esophagus and stomach were normal. She's had a trial of high-dose PPI as well as Carafate which have not provided any benefit. She's had a CT scan of her chest again in July of 2019, this did not show any clear source for her symptoms.   At our last visit we discussed options. I referred her for manometry and pH testing which she did not follow through with, she thought it was going to be painful. She continues to feel about the same. She said she has been reading about esophageal spasm, which we have discussed in the past, and she tried drinking hot water at the time of symptoms, and she states it helps to abort an episode and has been working for her. She has been feeling better recently since doing this. We had discussed using peppermint supplements at the last visit, she has not tried that yet.  EGD 12/27/2017 - 1cm HH, normal esophagus, patchy erythema of the stomach, otherwise normal - biopsies of esophagus normal as were gastric biopsies  CT chest 11/12/2017 - tiny pulmonary nodules, follow up in 12 months. Aortic atherosclerosis, 1cm left adrenal adenoma  Cardiac CT - 03/07/2017 - dilated pulmonary artery, otherwise no significant coronary calcium Echocardiogram 03/17/2017 - 55-60% Negative nuclear stress 04/20/18  [place] RUQ 04/20/2017 -  normal  Cologuard - 05/08/2017 - negative   Past Medical History:  Diagnosis Date  . Acute blood loss as cause of postoperative anemia   . Chest pain    a. prior coronary CT without CAD.  Marland Kitchen Essential hypertension 07/08/2016  . GERD (gastroesophageal reflux disease)   . PAD (peripheral artery disease) ([place])    a. 05/2018: PV angio subacute thrombotic occlusion of her popliteal artery. She underwent  successful penumbra aspiration thrombectomy, PTA and self-expanding stenting using overlapping Tigris self-expanding stents of a long thrombotic occlusion of the popliteal, anterior tibial and tibioperoneal trunk. Procedure c/b pseudoaneurysm/ABL anemia.  . 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[place])   . Uterine fibroid   . Vitamin D deficiency      Past Surgical History:  Procedure Laterality Date  . ABDOMINAL AORTOGRAM W/[name] EXTREMITY Right 05/08/2018   Procedure: ABDOMINAL AORTOGRAM W/[name] EXTREMITY;  Surgeon: Lorretta Harp, MD;  Location: [facility];  Service: Cardiovascular;  Laterality: Right;  . lump removed from breast at age 37    . ovarian cyst removed at 53yr old    . PERIPHERAL VASCULAR INTERVENTION Right 05/08/2018   Procedure: PERIPHERAL VASCULAR INTERVENTION;  Surgeon: Lorretta Harp, MD;  Location: [facility];  Service: Cardiovascular;  Laterality: Right;  Anterior tibial and popliteal stents  . PERIPHERAL VASCULAR THROMBECTOMY Right 05/08/2018   Procedure: PERIPHERAL VASCULAR THROMBECTOMY;  Surgeon: Lorretta Harp, MD;  Location: [facility];  Service: Cardiovascular;  Laterality: Right;  Popliteal, tibioperoneal trunk, Anterior tibial, Posterior tibial   Family History  Problem Relation Age of Onset  . Hypertension Mother   . Dementia Father   . Colon polyps Neg Hx   . Crohn's disease Neg Hx   . Rectal cancer Neg Hx   . Stomach cancer Neg Hx   . Pancreatic cancer Neg Hx    Social History   Tobacco Use  . Smoking status: Never Smoker  . Smokeless tobacco: Never Used  Substance Use Topics  . Alcohol use: No  . Drug use: No   Current Outpatient Medications  Medication Sig Dispense Refill  . apixaban (ELIQUIS) 5 MG TABS tablet Take 1 tablet (5 mg total) by mouth 2 (two) times daily. 60 tablet 6  . atorvastatin (LIPITOR) 80 MG tablet Take 0.5 tablets (40 mg total) by mouth every evening. 30 tablet 6  . carvedilol (COREG) 6.25 MG tablet  Take 1 tablet (6.25 mg total) by mouth 2 (two) times daily. 60 tablet 4  . clopidogrel (PLAVIX) 75 MG tablet Take 1 tablet (75 mg total) by mouth daily. 90 tablet 3  . ferrous gluconate (FERGON) 324 MG tablet Take 1 tablet (324 mg total) by mouth 3 (three) times a week. 90 tablet 3  . megestrol (MEGACE) 40 MG tablet Take 1 tablet (40 mg total) by mouth 2 (two) times daily. 40 mg daily. May take additional 40 mg in the event of heavy bleeding. (Patient taking differently: Take 40 mg by mouth See admin instructions. Take 1 tablet (40 mg) by mouth scheduled once daily. May take additional 40 mg in the event of heavy bleeding.) 60 tablet 5  . potassium chloride (K-DUR) 10 MEQ tablet Take 1 tablet (10 mEq total) by mouth 3 (three) times a week. 90 tablet 0  . valsartan (DIOVAN) 160 MG tablet Take 1 tablet (160 mg total) by mouth daily. TO REPLACE LOSARTAN 90 tablet 3   No current facility-administered medications for this visit.    Allergies  Allergen Reactions  . Lisinopril Cough     Review of Systems: All systems reviewed and negative except where noted in HPI.   Lab Results  Component Value Date   WBC 8.3 09/29/2018   HGB 14.5 09/29/2018   HCT 43.9 09/29/2018   MCV 89.6 09/29/2018   PLT 266 09/29/2018    Lab Results  Component Value Date   CREATININE 0.92 07/14/2018   BUN 14 07/14/2018   NA 140 07/14/2018   K 4.5 07/14/2018   CL 104 07/14/2018   CO2 21 07/14/2018    Lab Results  Component Value Date   ALT 24 09/05/2018   AST 15 09/05/2018   ALKPHOS 120 (H) 09/05/2018   BILITOT 0.3 09/05/2018     Physical Exam: Ht 5\' 7"  (1.702 m) Comment: pt provided over the phone  Wt 284 lb (128.8 kg) Comment: pt provided over the phone  BMI 44.48 kg/m     ASSESSMENT AND PLAN: 53 y/o female here for reassessment of the following issues:  Atypical chest pain / esophageal spasm - extensive evaluation thus far, she has completed a cardiac workup and it was unrevealing. We have  discussed the possibility of dysmotility, esophageal spasm in the past, referred for manometry and she did not follow through with it. She has noted drinking warm water helps to abort an episode now, which goes along with spasm. I suspect that is the likely cause from her history today. We discussed options. She has concerns about her ability to tolerate the manometry, and while it's possible it could confirm the diagnosis, if she is not having symptoms, it could also be normal. I recommend we empirically treat her for spasm at this time, discussed options. Peppermint oil would be the first thing to try given its safety profile. Recommend 2 peppermint althoids sublingual at onset of episode to try and abort it. She can try doing this TID to help prevent an episode as well. We could also consider a trial of diltiazem however may need to adjust her other BP meds if we used this, or consider a TCA. If she has no benefit from these measures and symptoms persist, she may be more willing to do the manometry. Given her failure of PPI in the past and no typical reflux symptoms, I think reflux is unlikely, but to confirm that would need a pH test, I don't think she would tolerate that right now. She agreed with the plan, she will contact me in a few weeks to let me know how she is doing.     [place], MD [facility]

## 2018-10-19 ENCOUNTER — Ambulatory Visit (HOSPITAL_BASED_OUTPATIENT_CLINIC_OR_DEPARTMENT_OTHER)
Admission: RE | Admit: 2018-10-19 | Discharge: 2018-10-19 | Disposition: A | Discharge status: Home / Self Care | Payer: BC Managed Care – PPO | Source: Ambulatory Visit | Attending: Family Medicine | Admitting: Family Medicine

## 2018-10-19 ENCOUNTER — Ambulatory Visit: Payer: BC Managed Care – PPO | Admitting: Family Medicine

## 2018-10-19 ENCOUNTER — Other Ambulatory Visit (HOSPITAL_BASED_OUTPATIENT_CLINIC_OR_DEPARTMENT_OTHER): Payer: Self-pay | Admitting: Family Medicine

## 2018-10-19 ENCOUNTER — Other Ambulatory Visit: Payer: Self-pay

## 2018-10-19 ENCOUNTER — Ambulatory Visit: Payer: Self-pay

## 2018-10-19 DIAGNOSIS — Z1231 Encounter for screening mammogram for malignant neoplasm of breast: Secondary | ICD-10-CM | POA: Insufficient documentation

## 2018-10-20 ENCOUNTER — Encounter: Payer: Self-pay | Admitting: Family Medicine

## 2018-10-20 ENCOUNTER — Ambulatory Visit (INDEPENDENT_AMBULATORY_CARE_PROVIDER_SITE_OTHER): Payer: BC Managed Care – PPO | Admitting: Family Medicine

## 2018-10-20 VITALS — BP 124/80 | HR 85 | Temp 99.6°F | Ht 67.0 in | Wt 281.0 lb

## 2018-10-20 DIAGNOSIS — E559 Vitamin D deficiency, unspecified: Secondary | ICD-10-CM

## 2018-10-20 DIAGNOSIS — Z862 Personal history of diseases of the blood and blood-forming organs and certain disorders involving the immune mechanism: Secondary | ICD-10-CM | POA: Diagnosis not present

## 2018-10-20 DIAGNOSIS — R7303 Prediabetes: Secondary | ICD-10-CM

## 2018-10-20 DIAGNOSIS — R42 Dizziness and giddiness: Secondary | ICD-10-CM | POA: Diagnosis not present

## 2018-10-20 NOTE — Progress Notes (Addendum)
Established Patient Office Visit  Subjective:  Patient ID: Christina [name], female    DOB: 11/11/1965  Age: 53 y.o. MRN: [id number]  CC:  Chief Complaint  Patient presents with  . Dizziness    HPI [name] presents for treatment and evaluation of a one-week history of lightheadedness that she describes as near syncope with of a spinning sensation.  There is been a generalized headache accompanying it.  Patient denies sinus symptoms to include nasal congestion postnasal drip runny nose cough myalgias arthralgias fevers or chills.  She denies nausea vomiting abdominal pain diarrhea dysuria, vaginal bleeding or discharge.  She has not been sleeping well because her mattress is uncomfortable.  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  Things seem to be going well for her at home.  Past Medical History:  Diagnosis Date  . Acute blood loss as cause of postoperative anemia   . Chest pain    a. prior coronary CT without CAD.  Marland Kitchen Essential hypertension 07/08/2016  . GERD (gastroesophageal reflux disease)   . PAD (peripheral artery disease) ([place])    a. 05/2018: PV angio subacute thrombotic occlusion of her popliteal artery. She underwent successful penumbra aspiration thrombectomy, PTA and self-expanding stenting using overlapping Tigris self-expanding stents of a long thrombotic occlusion of the popliteal, anterior tibial and tibioperoneal trunk. Procedure c/b pseudoaneurysm/ABL anemia.  . 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[place])   . Uterine fibroid   . Vitamin D deficiency     Past Surgical History:  Procedure Laterality Date  . ABDOMINAL AORTOGRAM W/LOWER EXTREMITY Right 05/08/2018   Procedure: ABDOMINAL AORTOGRAM W/LOWER EXTREMITY;  Surgeon: Lorretta Harp, MD;  Location: [place] [facility];  Service: Cardiovascular;  Laterality: Right;  . lump removed from breast at age 57    . ovarian cyst removed at 53yr old    . PERIPHERAL VASCULAR INTERVENTION Right  05/08/2018   Procedure: PERIPHERAL VASCULAR INTERVENTION;  Surgeon: Lorretta Harp, MD;  Location: [place] [facility];  Service: Cardiovascular;  Laterality: Right;  Anterior tibial and popliteal stents  . PERIPHERAL VASCULAR THROMBECTOMY Right 05/08/2018   Procedure: PERIPHERAL VASCULAR THROMBECTOMY;  Surgeon: Lorretta Harp, MD;  Location: [place] [facility];  Service: Cardiovascular;  Laterality: Right;  Popliteal, tibioperoneal trunk, Anterior tibial, Posterior tibial    Family History  Problem Relation Age of Onset  . Hypertension Mother   . Dementia Father   . Colon polyps Neg Hx   . Crohn's disease Neg Hx   . Rectal cancer Neg Hx   . Stomach cancer Neg Hx   . Pancreatic cancer Neg Hx     Social History   Socioeconomic History  . Marital status: Single    Spouse name: Not on file  . Number of children: Not on file  . Years of education: Not on file  . Highest education level: Not on file  Occupational History  . Not on file  Social Needs  . Financial resource strain: Not on file  . Food insecurity    Worry: Not on file    Inability: Not on file  . Transportation needs    Medical: Not on file    Non-medical: Not on file  Tobacco Use  . Smoking status: Never Smoker  . Smokeless tobacco: Never Used  Substance and Sexual Activity  . Alcohol use: No  . Drug use: No  . Sexual activity: Yes  Lifestyle  . Physical activity    Days per  week: Not on file    Minutes per session: Not on file  . Stress: Not on file  Relationships  . Social Herbalist on phone: Not on file    Gets together: Not on file    Attends religious service: Not on file    Active member of club or organization: Not on file    Attends meetings of clubs or organizations: Not on file    Relationship status: Not on file  . Intimate partner violence    Fear of current or ex partner: Not on file    Emotionally abused: Not on file    Physically abused: Not on file    Forced sexual  activity: Not on file  Other Topics Concern  . Not on file  Social History Narrative  . Not on file    Outpatient Medications Prior to Visit  Medication Sig Dispense Refill  . apixaban (ELIQUIS) 5 MG TABS tablet Take 1 tablet (5 mg total) by mouth 2 (two) times daily. 60 tablet 6  . atorvastatin (LIPITOR) 80 MG tablet Take 0.5 tablets (40 mg total) by mouth every evening. 30 tablet 6  . carvedilol (COREG) 6.25 MG tablet Take 1 tablet (6.25 mg total) by mouth 2 (two) times daily. 60 tablet 4  . clopidogrel (PLAVIX) 75 MG tablet Take 1 tablet (75 mg total) by mouth daily. 90 tablet 3  . ferrous gluconate (FERGON) 324 MG tablet Take 1 tablet (324 mg total) by mouth 3 (three) times a week. 90 tablet 3  . megestrol (MEGACE) 40 MG tablet Take 1 tablet (40 mg total) by mouth 2 (two) times daily. 40 mg daily. May take additional 40 mg in the event of heavy bleeding. (Patient taking differently: Take 40 mg by mouth See admin instructions. Take 1 tablet (40 mg) by mouth scheduled once daily. May take additional 40 mg in the event of heavy bleeding.) 60 tablet 5  . potassium chloride (K-DUR) 10 MEQ tablet Take 1 tablet (10 mEq total) by mouth 3 (three) times a week. 90 tablet 0  . valsartan (DIOVAN) 160 MG tablet Take 1 tablet (160 mg total) by mouth daily. TO REPLACE LOSARTAN 90 tablet 3   No facility-administered medications prior to visit.     Allergies  Allergen Reactions  . Lisinopril Cough    ROS Review of Systems  Constitutional: Negative for chills, diaphoresis, fatigue, fever and unexpected weight change.  HENT: Negative for congestion, postnasal drip, rhinorrhea, sinus pressure, sinus pain, sore throat and trouble swallowing.   Eyes: Negative for photophobia and visual disturbance.  Respiratory: Negative for choking, shortness of breath and wheezing.   Cardiovascular: Negative.   Gastrointestinal: Negative for abdominal pain, anal bleeding, blood in stool, nausea and vomiting.   Endocrine: Negative for polyphagia and polyuria.  Genitourinary: Negative for dysuria, hematuria, vaginal bleeding and vaginal discharge.  Musculoskeletal: Negative for arthralgias and myalgias.  Skin: Negative for pallor and rash.  Allergic/Immunologic: Negative for immunocompromised state.  Neurological: Positive for light-headedness and headaches. Negative for dizziness, speech difficulty, weakness and numbness.  Hematological: Does not bruise/bleed easily.  Psychiatric/Behavioral: Negative for dysphoric mood. The patient is not nervous/anxious.    Depression screen [facility] 2/9 10/20/2018 01/31/2017  Decreased Interest 0 0  Down, Depressed, Hopeless 0 0  PHQ - 2 Score 0 0  Altered sleeping 3 -  Tired, decreased energy 3 -  Change in appetite 0 -  Feeling bad or failure about yourself  0 -  Trouble  concentrating 0 -  Moving slowly or fidgety/restless 0 -  Suicidal thoughts 0 -  PHQ-9 Score 6 -      Objective:    Physical Exam  Constitutional: She is oriented to person, place, and time. She appears well-developed and well-nourished. No distress.  HENT:  Head: Normocephalic and atraumatic.  Right Ear: External ear normal.  Left Ear: External ear normal.  Mouth/Throat: Oropharynx is clear and moist. No oropharyngeal exudate.  Eyes: Pupils are equal, round, and reactive to light. Conjunctivae are normal. Right eye exhibits no discharge. Left eye exhibits no discharge.  Neck: No JVD present. No tracheal deviation present. No thyromegaly present.  Cardiovascular: Normal rate, regular rhythm and normal heart sounds.  Pulmonary/Chest: Effort normal and breath sounds normal. No stridor. No respiratory distress. She has no wheezes. She has no rales.  Abdominal: Bowel sounds are normal.  Lymphadenopathy:    She has no cervical adenopathy.  Neurological: She is alert and oriented to person, place, and time.  Skin: Skin is warm and dry. She is not diaphoretic.  Psychiatric: She has a normal  mood and affect. Her behavior is normal.    BP 124/80   Pulse 85   Temp 99.6 F (37.6 C) (Oral)   Ht 5\' 7"  (1.702 m)   Wt 281 lb (127.5 kg)   SpO2 95%   BMI 44.01 kg/m  Wt Readings from Last 3 Encounters:  10/20/18 281 lb (127.5 kg)  10/13/18 284 lb (128.8 kg)  09/29/18 284 lb 6 oz (129 kg)   BP Readings from Last 3 Encounters:  10/20/18 124/80  09/29/18 (!) 138/98  08/25/18 138/84   Guideline developer:  UpToDate (see UpToDate for funding source) Date Released: June 2014  Health Maintenance Due  Topic Date Due  . HIV Screening  01/31/1979    There are no preventive care reminders to display for this patient.  Lab Results  Component Value Date   TSH 0.769 05/02/2018   Lab Results  Component Value Date   WBC 7.1 10/20/2018   HGB 13.5 10/20/2018   HCT 40.7 10/20/2018   MCV 91.1 10/20/2018   PLT 245 10/20/2018   Lab Results  Component Value Date   NA 139 10/20/2018   K 4.5 10/20/2018   CO2 21 10/20/2018   GLUCOSE 90 10/20/2018   BUN 11 10/20/2018   CREATININE 0.80 10/20/2018   BILITOT 0.8 10/20/2018   ALKPHOS 120 (H) 09/05/2018   AST 13 10/20/2018   ALT 14 10/20/2018   PROT 7.1 10/20/2018   ALBUMIN 4.1 09/05/2018   CALCIUM 9.6 10/20/2018   ANIONGAP 7 05/10/2018   GFR 100.40 04/03/2018   Lab Results  Component Value Date   CHOL 119 09/05/2018   Lab Results  Component Value Date   HDL 43 09/05/2018   Lab Results  Component Value Date   LDLCALC 55 09/05/2018   Lab Results  Component Value Date   TRIG 106 09/05/2018   Lab Results  Component Value Date   CHOLHDL 2.8 09/05/2018   Lab Results  Component Value Date   HGBA1C 5.8 (H) 10/20/2018      Assessment & Plan:   Problem List Items Addressed This Visit      Other   Vitamin D deficiency - Primary   Relevant Medications   Vitamin D, Ergocalciferol, (DRISDOL) 1.25 MG (50000 UT) CAPS capsule   Other Relevant Orders   VITAMIN D 25 Hydroxy (Vit-D Deficiency, Fractures) (Completed)    Urinalysis, Routine w reflex microscopic  Pre-diabetes   Relevant Orders   Hemoglobin A1c (Completed)   Comprehensive metabolic panel (Completed)   Urinalysis, Routine w reflex microscopic   Lightheaded   Relevant Orders   CBC (Completed)   Urinalysis, Routine w reflex microscopic    Other Visit Diagnoses    History of anemia       Relevant Medications   b complex vitamins capsule   Other Relevant Orders   Vitamin B12 (Completed)   CBC (Completed)   Iron, TIBC and Ferritin Panel (Completed)   Urinalysis, Routine w reflex microscopic      Meds ordered this encounter  Medications  . Vitamin D, Ergocalciferol, (DRISDOL) 1.25 MG (50000 UT) CAPS capsule    Sig: Take 1 capsule (50,000 Units total) by mouth every 7 (seven) days.    Dispense:  25 capsule    Refill:  1  . b complex vitamins capsule    Sig: Take 1 capsule by mouth daily.    Dispense:  90 capsule    Refill:  1    Follow-up: Return in about 1 week (around 10/27/2018).   May take Ibu for headache. Labs pending follow up in one week.  Am concerned about dehydration.  Asked the patient to increase her fluid intake.

## 2018-10-20 NOTE — Addendum Note (Signed)
Addended by: Lynnea Ferrier on: 10/20/2018 04:19 PM   Modules accepted: Orders

## 2018-10-22 LAB — CBC
HCT: 40.7 % (ref 35.0–45.0)
Hemoglobin: 13.5 g/dL (ref 11.7–15.5)
MCH: 30.2 pg (ref 27.0–33.0)
MCHC: 33.2 g/dL (ref 32.0–36.0)
MCV: 91.1 fL (ref 80.0–100.0)
MPV: 10.8 fL (ref 7.5–12.5)
Platelets: 245 10*3/uL (ref 140–400)
RBC: 4.47 10*6/uL (ref 3.80–5.10)
RDW: 14.4 % (ref 11.0–15.0)
WBC: 7.1 10*3/uL (ref 3.8–10.8)

## 2018-10-22 LAB — COMPREHENSIVE METABOLIC PANEL
AG Ratio: 1.4 (calc) (ref 1.0–2.5)
ALT: 14 U/L (ref 6–29)
AST: 13 U/L (ref 10–35)
Albumin: 4.1 g/dL (ref 3.6–5.1)
Alkaline phosphatase (APISO): 112 U/L (ref 37–153)
BUN: 11 mg/dL (ref 7–25)
CO2: 21 mmol/L (ref 20–32)
Calcium: 9.6 mg/dL (ref 8.6–10.4)
Chloride: 107 mmol/L (ref 98–110)
Creat: 0.8 mg/dL (ref 0.50–1.05)
Globulin: 3 g/dL (calc) (ref 1.9–3.7)
Glucose, Bld: 90 mg/dL (ref 65–99)
Potassium: 4.5 mmol/L (ref 3.5–5.3)
Sodium: 139 mmol/L (ref 135–146)
Total Bilirubin: 0.8 mg/dL (ref 0.2–1.2)
Total Protein: 7.1 g/dL (ref 6.1–8.1)

## 2018-10-22 LAB — IRON,TIBC AND FERRITIN PANEL
%SAT: 27 % (calc) (ref 16–45)
Ferritin: 51 ng/mL (ref 16–232)
Iron: 83 ug/dL (ref 45–160)
TIBC: 310 mcg/dL (calc) (ref 250–450)

## 2018-10-22 LAB — HEMOGLOBIN A1C
Hgb A1c MFr Bld: 5.8 % of total Hgb — ABNORMAL HIGH (ref <5.7)
Mean Plasma Glucose: 120 (calc)
eAG (mmol/L): 6.6 (calc)

## 2018-10-22 LAB — VITAMIN D 25 HYDROXY (VIT D DEFICIENCY, FRACTURES): Vit D, 25-Hydroxy: 11 ng/mL — ABNORMAL LOW (ref 30–100)

## 2018-10-22 LAB — VITAMIN B12: Vitamin B-12: 263 pg/mL (ref 200–1100)

## 2018-10-23 MED ORDER — B COMPLEX VITAMINS PO CAPS: 1.0000 | ORAL_CAPSULE | Freq: Every day | ORAL | 1 refills | Status: DC

## 2018-10-23 MED ORDER — VITAMIN D (ERGOCALCIFEROL) 1.25 MG (50000 UNIT) PO CAPS: 50000.0000 [IU] | ORAL_CAPSULE | ORAL | 1 refills | Status: AC

## 2018-10-23 NOTE — Addendum Note (Signed)
Addended by: Jon Billings on: 10/23/2018 08:21 AM   Modules accepted: Orders

## 2018-10-23 NOTE — Addendum Note (Signed)
Addended by: Lynnea Ferrier on: 10/23/2018 07:45 AM   Modules accepted: Orders

## 2018-10-27 ENCOUNTER — Telehealth: Payer: Self-pay | Admitting: Pharmacist

## 2018-10-27 NOTE — Telephone Encounter (Signed)
Patient calling with medication question.   Called back. LMOM; patient to call back and talk to pharmacist.

## 2018-10-29 ENCOUNTER — Encounter: Payer: Self-pay | Admitting: Family Medicine

## 2018-10-31 ENCOUNTER — Encounter: Payer: Self-pay | Admitting: Family Medicine

## 2018-11-01 NOTE — Telephone Encounter (Signed)
Patient request call tomorrow 11/02/2018 at 10:30am

## 2018-11-02 NOTE — Telephone Encounter (Signed)
Called & spoke w/ pt regarding raquel giving her a callback sometime next week and the pt was compliant

## 2018-11-08 ENCOUNTER — Encounter: Payer: Self-pay | Admitting: Family Medicine

## 2018-11-09 NOTE — Telephone Encounter (Signed)
RE: Non-Urgent Medical Question  Lorretta Harp, MD  Sent: Tue November 07, 2018 5:03 PM  To: Fidel Levy, RN      Message  Needs F/U LEA then ROV with me next week

## 2018-11-10 DIAGNOSIS — Z20828 Contact with and (suspected) exposure to other viral communicable diseases: Secondary | ICD-10-CM | POA: Diagnosis not present

## 2018-11-13 ENCOUNTER — Other Ambulatory Visit: Payer: Self-pay

## 2018-11-13 ENCOUNTER — Telehealth: Payer: Self-pay

## 2018-11-13 ENCOUNTER — Encounter: Payer: Self-pay | Admitting: Family Medicine

## 2018-11-13 DIAGNOSIS — N939 Abnormal uterine and vaginal bleeding, unspecified: Secondary | ICD-10-CM

## 2018-11-13 MED ORDER — MEGESTROL ACETATE 40 MG PO TABS: 40.0000 mg | ORAL_TABLET | ORAL | 1 refills | Status: DC

## 2018-11-13 NOTE — Telephone Encounter (Signed)
Okay sounds good thanks for your help.

## 2018-11-13 NOTE — Telephone Encounter (Signed)
Patient called stating she would like to proceed with her hysterectomy. Patient scheduled for consult via webex on July 31  Patient requesting refill on megace until then. Refill sent. Kathrene Alu RN

## 2018-11-13 NOTE — Telephone Encounter (Signed)
Called patient and she states she is taking Iron. Her Dr. Doristine Section her initially on 5 days a week and decreased it to 3 days a week. She will stop it and get back to [place] in about 1 week to let us know if her symptoms are improved or not.

## 2018-11-15 NOTE — Progress Notes (Signed)
Virtual Visit via Video Note   This visit type was conducted due to national recommendations for restrictions regarding the COVID-19 Pandemic (e.g. social distancing) in an effort to limit this patient's exposure and mitigate transmission in our community.  Due to her co-morbid illnesses, this patient is at least at moderate risk for complications without adequate follow up.  This format is felt to be most appropriate for this patient at this time.  All issues noted in this document were discussed and addressed.  A limited physical exam was performed with this format.  Please refer to the patient's chart for her consent to telehealth for [facility].   Date:  11/16/2018   ID:  Christina [name], DOB 12/27/1965, MRN [id number]  Patient Location: Home Provider Location: Office  PCP:  Libby Maw, MD  Cardiologist:  Quay Burow, MD  Electrophysiologist:  None   Evaluation Performed:  Follow-Up Visit  Chief Complaint:  Chest pain   November 16, 2018    Christina [name] is a 53 y.o. female with  Hx of PAD, HTN .  She has a history of chest pain in the past.  A coronary CT angiogram performed March 07, 2017 revealed a coronary calcium score was 0.  She had normal coronary arteries.  Had PAD procedure on Jan with Gwenlyn Found  She complains of sharp CP when she breaths.  .  Left sided chest pain Has been there for the past month or so Has had a cough, no fever,   Does not take a deep breath due to this pain  Has clear phlegm.   Hurts more when she lies down.  Better when she leans forward    The patient does not have symptoms concerning for COVID-19 infection (fever, chills, cough, or new shortness of breath).    Past Medical History:  Diagnosis Date  . Acute blood loss as cause of postoperative anemia   . Chest pain    a. prior coronary CT without CAD.  Marland Kitchen Essential hypertension 07/08/2016  . GERD (gastroesophageal reflux disease)   . PAD (peripheral artery disease) ([place])    a.  05/2018: PV angio subacute thrombotic occlusion of her popliteal artery. She underwent successful penumbra aspiration thrombectomy, PTA and self-expanding stenting using overlapping Tigris self-expanding stents of a long thrombotic occlusion of the popliteal, anterior tibial and tibioperoneal trunk. Procedure c/b pseudoaneurysm/ABL anemia.  . 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[place])   . Uterine fibroid   . Vitamin D deficiency    Past Surgical History:  Procedure Laterality Date  . ABDOMINAL AORTOGRAM W/[name] EXTREMITY Right 05/08/2018   Procedure: ABDOMINAL AORTOGRAM W/[name] EXTREMITY;  Surgeon: Lorretta Harp, MD;  Location: [place] [facility];  Service: Cardiovascular;  Laterality: Right;  . lump removed from breast at age 51    . ovarian cyst removed at 53yr old    . PERIPHERAL VASCULAR INTERVENTION Right 05/08/2018   Procedure: PERIPHERAL VASCULAR INTERVENTION;  Surgeon: Lorretta Harp, MD;  Location: [place] [facility];  Service: Cardiovascular;  Laterality: Right;  Anterior tibial and popliteal stents  . PERIPHERAL VASCULAR THROMBECTOMY Right 05/08/2018   Procedure: PERIPHERAL VASCULAR THROMBECTOMY;  Surgeon: Lorretta Harp, MD;  Location: [place] [facility];  Service: Cardiovascular;  Laterality: Right;  Popliteal, tibioperoneal trunk, Anterior tibial, Posterior tibial     Current Meds  Medication Sig  . apixaban (ELIQUIS) 5 MG TABS tablet Take 1 tablet (5 mg total) by mouth 2 (two) times daily.  Marland Kitchen atorvastatin (LIPITOR) 80 MG tablet  Take 80 mg by mouth daily.  Marland Kitchen b complex vitamins capsule Take 1 capsule by mouth daily.  . carvedilol (COREG) 6.25 MG tablet Take 1 tablet (6.25 mg total) by mouth 2 (two) times daily.  . clopidogrel (PLAVIX) 75 MG tablet Take 1 tablet (75 mg total) by mouth daily.  . megestrol (MEGACE) 40 MG tablet Take 1 tablet (40 mg total) by mouth See admin instructions. Take 1 tablet (40 mg) by mouth scheduled once daily. May take additional 40 mg in the event of  heavy bleeding.  . potassium chloride (K-DUR) 10 MEQ tablet Take 1 tablet (10 mEq total) by mouth 3 (three) times a week.  . valsartan (DIOVAN) 160 MG tablet Take 1 tablet (160 mg total) by mouth daily. TO REPLACE LOSARTAN  . Vitamin D, Ergocalciferol, (DRISDOL) 1.25 MG (50000 UT) CAPS capsule Take 1 capsule (50,000 Units total) by mouth every 7 (seven) days.     Allergies:   Lisinopril   Social History   Tobacco Use  . Smoking status: Never Smoker  . Smokeless tobacco: Never Used  Substance Use Topics  . Alcohol use: No  . Drug use: No     Family Hx: The patient's family history includes Dementia in her father; Hypertension in her mother. There is no history of Colon polyps, Crohn's disease, Rectal cancer, Stomach cancer, or Pancreatic cancer.  ROS:   Please see the history of present illness.     All other systems reviewed and are negative.   Prior CV studies:   The following studies were reviewed today:    Labs/Other Tests and Data Reviewed:    EKG:  No ECG reviewed.  Recent Labs: 05/02/2018: TSH 0.769 10/20/2018: ALT 14; BUN 11; Creat 0.80; Hemoglobin 13.5; Platelets 245; Potassium 4.5; Sodium 139   Recent Lipid Panel Lab Results  Component Value Date/Time   CHOL 119 09/05/2018 08:09 AM   TRIG 106 09/05/2018 08:09 AM   HDL 43 09/05/2018 08:09 AM   CHOLHDL 2.8 09/05/2018 08:09 AM   CHOLHDL 3.1 05/02/2017 10:21 AM   LDLCALC 55 09/05/2018 08:09 AM   LDLCALC 99 05/02/2017 10:21 AM    Wt Readings from Last 3 Encounters:  11/16/18 281 lb (127.5 kg)  10/20/18 281 lb (127.5 kg)  10/13/18 284 lb (128.8 kg)     Objective:    Vital Signs:  BP 136/89   Pulse 71   Ht 5\' 7"  (1.702 m)   Wt 281 lb (127.5 kg)   BMI 44.01 kg/m    VITAL SIGNS:  reviewed GEN:  no acute distress EYES:  sclerae anicteric, EOMI - Extraocular Movements Intact RESPIRATORY:  normal respiratory effort, symmetric expansion CARDIOVASCULAR:  no peripheral edema SKIN:  no rash, lesions or  ulcers. MUSCULOSKELETAL:  no obvious deformities. NEURO:  alert and oriented x 3, no obvious focal deficit PSYCH:  normal affect  ASSESSMENT & PLAN:    1. Probable pericarditis: I am not able to examine Christina [name] today because of the virtual nature of this office visit but her clinical symptoms are completely consistent with pericarditis.  She is had pleuritic chest pain for the past 4 weeks or so.  The pain is worse when she lies down and is better when she sits forward.  She has not had any fever.  She denies any yellow-green sputum.  We will treat her for pericarditis.  Colchicine 0.6 mg twice a day.  We will also start her on ibuprofen 400 mg 3 times a day.  I discussed  this with Dr. Gwenlyn Found who agrees that she can continue the Eliquis and Plavix for now.  We will see her in the office in 1 month for a follow-up office visit.  We will get an echocardiogram to evaluate her LV function and to evaluate her pericardium.  I also asked her to start taking Pepcid Complete 10 mg to help prevent gastroesophageal   Issues   2.  Hyperlipidemia: Continue atorvastatin.  3.  Essential hypertension: Continue current medications  COVID-19 Education: The signs and symptoms of COVID-19 were discussed with the patient and how to seek care for testing (follow up with PCP or arrange E-visit).  The importance of social distancing was discussed today.  Time:   Today, I have spent  32  minutes with the patient with telehealth technology discussing the above problems.     Medication Adjustments/Labs and Tests Ordered: Current medicines are reviewed at length with the patient today.  Concerns regarding medicines are outlined above.   Tests Ordered: Orders Placed This Encounter  Procedures  . ECHOCARDIOGRAM COMPLETE    Medication Changes: Meds ordered this encounter  Medications  . colchicine 0.6 MG tablet    Sig: 1 tablet by mouth twice a day for 2 weeks    Dispense:  60 tablet    Refill:  2  .  famotidine (PEPCID AC) 10 MG tablet    Sig: Take 1 tablet (10 mg total) by mouth 2 (two) times daily.    Dispense:  60 tablet    Refill:  0  . ibuprofen (ADVIL) 400 MG tablet    Sig: Take 1 tablet (400 mg total) by mouth 3 (three) times daily.    Dispense:  90 tablet    Refill:  0    Follow Up:  In Person in 1 month(s)  Signed, Mertie Moores, MD  11/16/2018 10:44 AM    [place]

## 2018-11-16 ENCOUNTER — Telehealth (INDEPENDENT_AMBULATORY_CARE_PROVIDER_SITE_OTHER): Payer: BC Managed Care – PPO | Admitting: Cardiovascular Disease

## 2018-11-16 ENCOUNTER — Telehealth: Payer: Self-pay

## 2018-11-16 ENCOUNTER — Other Ambulatory Visit: Payer: Self-pay

## 2018-11-16 ENCOUNTER — Encounter: Payer: Self-pay | Admitting: Cardiovascular Disease

## 2018-11-16 VITALS — BP 136/89 | HR 71 | Ht 67.0 in | Wt 281.0 lb

## 2018-11-16 DIAGNOSIS — I319 Disease of pericardium, unspecified: Secondary | ICD-10-CM | POA: Diagnosis not present

## 2018-11-16 DIAGNOSIS — E782 Mixed hyperlipidemia: Secondary | ICD-10-CM

## 2018-11-16 DIAGNOSIS — I10 Essential (primary) hypertension: Secondary | ICD-10-CM | POA: Diagnosis not present

## 2018-11-16 DIAGNOSIS — Z7189 Other specified counseling: Secondary | ICD-10-CM | POA: Diagnosis not present

## 2018-11-16 MED ORDER — MITIGARE 0.6 MG PO CAPS: 0.6000 mg | ORAL_CAPSULE | Freq: Two times a day (BID) | ORAL | 6 refills | Status: DC

## 2018-11-16 MED ORDER — IBUPROFEN 400 MG PO TABS: 400.0000 mg | ORAL_TABLET | Freq: Three times a day (TID) | ORAL | 0 refills | Status: DC

## 2018-11-16 MED ORDER — FAMOTIDINE 10 MG PO TABS: 10.0000 mg | ORAL_TABLET | Freq: Two times a day (BID) | ORAL | 0 refills | Status: DC

## 2018-11-16 MED ORDER — COLCHICINE 0.6 MG PO TABS: ORAL_TABLET | ORAL | 2 refills | Status: DC

## 2018-11-16 NOTE — Patient Instructions (Addendum)
Medication Instructions:   Your physician has recommended you make the following change in your medication:  Start Colchicine 0.6MG  1 tablet by mouth twice a day for 2 weeks, then take as needed  Start Ibuprofen 400MG  1 tablet by mouth three times a day  Start Pepcid Complete 10MG  1 tablet by mouth twice a day  If you need a refill on your cardiac medications before your next appointment, please call your pharmacy.   Lab work:  None ordered today   If you have labs (blood work) drawn today and your tests are completely normal, you will receive your results only by: Marland Kitchen MyChart Message (if you have MyChart) OR . A paper copy in the mail If you have any lab test that is abnormal or we need to change your treatment, we will call you to review the results.  Testing/Procedures:  Your physician has requested that you have an echocardiogram on 11/21/18 at 7:35AM. Echocardiography is a painless test that uses sound waves to create images of your heart. It provides your doctor with information about the size and shape of your heart and how well your heart's chambers and valves are working. This procedure takes approximately one hour. There are no restrictions for this procedure.   Follow-Up:  On 12/04/2018 in office at 1:40PM with Mertie Moores, MD

## 2018-11-16 NOTE — Telephone Encounter (Signed)
The pts insurance provider will not cover Colchicine so I changed RX to [place] and then called [place] [facility] and s/w Sam (he may have said [place]).  Per Sam, after re-running the new [facility] RX, the pts ins is covering it and will $36.32/30 day supply.  Sam states that this amount is a lot less than the $400/30 day supply of Colchicine.  He states that they will get RX ready for the pt.

## 2018-11-21 ENCOUNTER — Ambulatory Visit (HOSPITAL_COMMUNITY): Payer: BC Managed Care – PPO | Attending: Cardiology

## 2018-11-21 ENCOUNTER — Encounter (HOSPITAL_COMMUNITY): Payer: BC Managed Care – PPO

## 2018-11-21 ENCOUNTER — Other Ambulatory Visit: Payer: Self-pay

## 2018-11-21 DIAGNOSIS — I319 Disease of pericardium, unspecified: Secondary | ICD-10-CM

## 2018-11-22 ENCOUNTER — Encounter: Payer: Self-pay | Admitting: Family Medicine

## 2018-12-01 ENCOUNTER — Encounter: Payer: Self-pay | Admitting: Obstetrics & Gynecology

## 2018-12-01 ENCOUNTER — Ambulatory Visit (INDEPENDENT_AMBULATORY_CARE_PROVIDER_SITE_OTHER): Payer: BC Managed Care – PPO | Admitting: Obstetrics & Gynecology

## 2018-12-01 ENCOUNTER — Other Ambulatory Visit: Payer: Self-pay

## 2018-12-01 DIAGNOSIS — D251 Intramural leiomyoma of uterus: Secondary | ICD-10-CM

## 2018-12-01 DIAGNOSIS — N950 Postmenopausal bleeding: Secondary | ICD-10-CM

## 2018-12-01 NOTE — Progress Notes (Signed)
TELEHEALTH GYNECOLOGY VIRTUAL VIDEO VISIT ENCOUNTER NOTE  Provider location: [facility] at [facility]   I connected with Christina [name] on 12/01/18 at 11:00 AM EDT by WebEx video Encounter at home and verified that I am speaking with the correct person using two identifiers.   I discussed the limitations, risks, security and privacy concerns of performing an evaluation and management service virtually and the availability of in person appointments. I also discussed with the patient that there may be a patient responsible charge related to this service. The patient expressed understanding and agreed to proceed.   History:    53 y.o. G1P1 here today for Pt is s/p placement of stents for PAD Thrombin injection of left common femoral artery pseudoaneurysm  Pt on Megace 40mg  once a day. She reports that she still has bleeding if she stops the Megace. She does not know when she last had bleeding as she cont on the Megace. She reports that she want to proceed with surgery to have her fibroids removed.  She denies any new sx.    Past Medical History:  Diagnosis Date  . Acute blood loss as cause of postoperative anemia   . Chest pain    a. prior coronary CT without CAD.  Marland Kitchen Essential hypertension 07/08/2016  . GERD (gastroesophageal reflux disease)   . PAD (peripheral artery disease) ([place])    a. 05/2018: PV angio subacute thrombotic occlusion of her popliteal artery. She underwent successful penumbra aspiration thrombectomy, PTA and self-expanding stenting using overlapping Tigris self-expanding stents of a long thrombotic occlusion of the popliteal, anterior tibial and tibioperoneal trunk. Procedure c/b pseudoaneurysm/ABL anemia.  . 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[place])   . Uterine fibroid   . Vitamin D deficiency    Past Surgical History:  Procedure Laterality Date  . ABDOMINAL AORTOGRAM W/[name] EXTREMITY Right 05/08/2018   Procedure: ABDOMINAL AORTOGRAM W/[name]  EXTREMITY;  Surgeon: Lorretta Harp, MD;  Location: [place] [facility];  Service: Cardiovascular;  Laterality: Right;  . lump removed from breast at age 35    . ovarian cyst removed at 53yr old    . PERIPHERAL VASCULAR INTERVENTION Right 05/08/2018   Procedure: PERIPHERAL VASCULAR INTERVENTION;  Surgeon: Lorretta Harp, MD;  Location: [place] [facility];  Service: Cardiovascular;  Laterality: Right;  Anterior tibial and popliteal stents  . PERIPHERAL VASCULAR THROMBECTOMY Right 05/08/2018   Procedure: PERIPHERAL VASCULAR THROMBECTOMY;  Surgeon: Lorretta Harp, MD;  Location: [place] [facility];  Service: Cardiovascular;  Laterality: Right;  Popliteal, tibioperoneal trunk, Anterior tibial, Posterior tibial   The following portions of the patient's history were reviewed and updated as appropriate: allergies, current medications, past family history, past medical history, past social history, past surgical history and problem list.   Health Maintenance:  Normal pap and POS hrHPV on 07/08/2016.  Normal mammogram on 10/19/2018.   Review of Systems:  Pertinent items noted in HPI and remainder of comprehensive ROS otherwise negative.  Physical Exam:   General:  Alert, oriented and cooperative. Patient appears to be in no acute distress.  Mental Status: Normal mood and affect. Normal behavior. Normal judgment and thought content.   Respiratory: Normal respiratory effort, no problems with respiration noted  Rest of physical exam deferred due to type of encounter  Labs and Imaging  09/13/2016 CLINICAL DATA:  Abnormal uterine bleeding  EXAM: TRANSABDOMINAL ULTRASOUND OF PELVIS  TECHNIQUE: Transabdominal ultrasound examinations of the pelvis were performed. Transabdominal technique was performed for global  imaging of the pelvis including uterus, ovaries, adnexal regions, and pelvic cul-de-sac.  : 10/19/2012  FINDINGS: Uterus  Measurements: 21.4 x 11.4 x 13.6 cm. 10.8 cm left  posterior fundal fibroid. 10.2 cm central uterine fibroid.  Endometrium  Thickness: Limited views due to fibroids. 18 mm in thickness where visualized. No focal abnormality visualized.  Right ovary  Measurements: Unable to visualize. No adnexal mass seen.  Left ovary  Measurements: 3.2 x 1.8 x 3.4 cm. Normal appearance/no adnexal mass.  Other findings  No abnormal free fluid.  The patient refused transvaginal imaging.  IMPRESSION: Enlarged fibroid uterus, increasing in size since prior study.   Assessment and Plan:     Fibroids and menopausal bleeding  I have informed pt that since it has been over a year since we evaluated her, we would need to repeat her exam and her endometrial biopsy prior to surgery.  She reported that the exam was too uncomfortable and that she really needs to think about it.   I reviewed with her the risk of malignancy given the bleeding. I also explained ways to decrease the pain from the procedure.  She said that she does not want to make an appt now but, will called once she has thought about.   She is also due for a repeat PAP as she had a pos hrHPV on her PAP in 2018. We will call her to see if she will come in for a PAP smear.      I discussed the assessment and treatment plan with the patient. The patient was provided an opportunity to ask questions and all were answered. The patient agreed with the plan and demonstrated an understanding of the instructions.   The patient was advised to call back or seek an in-person evaluation/go to the ED if the symptoms worsen or if the condition fails to improve as anticipated.  I provided 17 minutes of face-to-face time during this encounter.   Lavonia Drafts, MD [facility], [place]

## 2018-12-01 NOTE — Progress Notes (Signed)
Patient agrees to virtual visit. Patient would like to consult about hysterectomy. Kathrene Alu RN

## 2018-12-04 ENCOUNTER — Ambulatory Visit: Payer: BC Managed Care – PPO | Admitting: Cardiovascular Disease

## 2018-12-05 ENCOUNTER — Encounter (HOSPITAL_COMMUNITY): Payer: BC Managed Care – PPO

## 2018-12-07 NOTE — Telephone Encounter (Signed)
Patient request a call back on Monday 8/10 @ 10:30am

## 2018-12-12 ENCOUNTER — Other Ambulatory Visit: Payer: Self-pay

## 2018-12-12 ENCOUNTER — Other Ambulatory Visit (HOSPITAL_COMMUNITY): Payer: Self-pay | Admitting: Cardiovascular Disease

## 2018-12-12 ENCOUNTER — Ambulatory Visit (INDEPENDENT_AMBULATORY_CARE_PROVIDER_SITE_OTHER): Payer: BC Managed Care – PPO | Admitting: Cardiovascular Disease

## 2018-12-12 ENCOUNTER — Encounter: Payer: Self-pay | Admitting: Cardiovascular Disease

## 2018-12-12 ENCOUNTER — Encounter: Payer: Self-pay | Admitting: Family Medicine

## 2018-12-12 ENCOUNTER — Ambulatory Visit (HOSPITAL_COMMUNITY)
Admission: RE | Admit: 2018-12-12 | Discharge: 2018-12-12 | Disposition: A | Discharge status: Home / Self Care | Payer: BC Managed Care – PPO | Source: Ambulatory Visit | Attending: Cardiovascular Disease | Admitting: Cardiovascular Disease

## 2018-12-12 VITALS — BP 140/100 | HR 75 | Temp 97.0°F | Ht 67.0 in | Wt 281.0 lb

## 2018-12-12 DIAGNOSIS — I70229 Atherosclerosis of native arteries of extremities with rest pain, unspecified extremity: Secondary | ICD-10-CM

## 2018-12-12 DIAGNOSIS — I998 Other disorder of circulatory system: Secondary | ICD-10-CM

## 2018-12-12 DIAGNOSIS — I739 Peripheral vascular disease, unspecified: Secondary | ICD-10-CM

## 2018-12-12 DIAGNOSIS — I10 Essential (primary) hypertension: Secondary | ICD-10-CM

## 2018-12-12 DIAGNOSIS — Z959 Presence of cardiac and vascular implant and graft, unspecified: Secondary | ICD-10-CM | POA: Insufficient documentation

## 2018-12-12 DIAGNOSIS — Z9889 Other specified postprocedural states: Secondary | ICD-10-CM

## 2018-12-12 NOTE — Assessment & Plan Note (Signed)
History of critical ischemia with fairly abrupt onset of leg pain and Dopplers performed 04/28/2018 revealing a right ABI 0.61.  I performed angiography 05/08/2018 revealing an occluded above-knee popliteal artery extending down below the knee into the proximal anterior tibial, tibioperoneal trunk.  She underwent a long complex procedure with penumbra aspiration thrombectomy followed by balloon angioplasty and implantation of 2 overlapping tiger self-expanding stents in the popliteal artery down to the anterior tibial artery.  Unfortunately, the tibioperoneal trunk remained occluded.  Follow-up Dopplers performed 05/26/2018 revealed a widely patent stent as per the Dopplers today with normal ABIs.  The patient denies claudication remains on Plavix and Eliquis.

## 2018-12-12 NOTE — Patient Instructions (Signed)
Medication Instructions:  Your physician recommends that you continue on your current medications as directed. Please refer to the Current Medication list given to you today.  If you need a refill on your cardiac medications before your next appointment, please call your pharmacy.   Lab work: NONE If you have labs (blood work) drawn today and your tests are completely normal, you will receive your results only by: Marland Kitchen MyChart Message (if you have MyChart) OR . A paper copy in the mail If you have any lab test that is abnormal or we need to change your treatment, we will call you to review the results.  Testing/Procedures: Your physician has requested that you have a lower or upper extremity arterial duplex. This test is an ultrasound of the arteries in the legs or arms. It looks at arterial blood flow in the legs and arms. Allow one hour for Lower and Upper Arterial scans. There are no restrictions or special instructions TO BE SCHEDULED FOR 6 MONTHS  Your physician has requested that you have an ankle brachial index (ABI). During this test an ultrasound and blood pressure cuff are used to evaluate the arteries that supply the arms and legs with blood. Allow thirty minutes for this exam. There are no restrictions or special instructions. TO BE SCHEDULED FOR 6 MONTHS  Follow-Up: At [facility], you and your health needs are our priority.  As part of our continuing mission to provide you with exceptional heart care, we have created designated Provider Care Teams.  These Care Teams include your primary Cardiologist (physician) and Advanced Practice Providers (APPs -  Physician Assistants and Nurse Practitioners) who all work together to provide you with the care you need, when you need it. You will need a follow up appointment in 12 months WITH DR. Gwenlyn Found.  Please call our office 2 months in advance to schedule this appointment.

## 2018-12-12 NOTE — Progress Notes (Signed)
12/12/2018 Christina [name]   1966-03-26  [id number]  Primary Physician Libby Maw, MD Primary Cardiologist: Lorretta Harp MD Lupe Carney, [place]  HPI:  Christina [name] is a 53 y.o.  severely overweight single African-American female mother of 1 child, grandmother and 3 grandchildren who works Chief Operating Officer at [employer]. She was referred by Dr. Acie Fredrickson for peripheral vascular evaluation because of  right calf claudication which was lifestyle limiting.I last saw her in the office  08/25/2018.She has a history of hypertension. She has chronic chest pain with a recent negative Myoview and a negative coronary CTA 1 year ago. She had fairly recent onset right calf pain approxi-1 month ago that occurred when she woke up 1 morning and has been fairly persistent with ambulation. She had [name] extremity arterial Doppler studies performed 04/28/2018 revealing a right ABI 0.61 with an occluded right popliteal artery.  I performed peripheral angiography 05/08/2018 revealing occluded above-knee popliteal artery extending down below the knee into the proximal anterior tibial and tibioperoneal trunk. She had a long complex procedure with penumbra aspiration thrombectomy followed by balloon angioplasty and implantation of 2 overlapping Tigris self-expanding stents beginning in the popliteal artery extending down into the anterior tibial artery. Unfortunately her tibioperoneal trunk remains occluded. Her Dopplers normalized and her pain is resolved. She did unfortunately have a left common femoral pseudoaneurysm and ultimately underwent ultrasound-guided thrombin injection and successfully was closed.  .  She is on Eliquis and clopidogrel.  Dopplers performed 05/26/2018 revealed a right ABI of 1.24 with a patent stent.  She does continue to complain of atypical chest pain beginning under her right breast and going into her neck and jaw.  She had a negative Myoview stress test in December  of last year and a coronary CTA revealing a coronary calcium score of 0 with normal coronary arteries.  In addition, she is taken sublingual nitroglycerin for this which does not work.  I reassured her that I do not think this is related to coronary artery disease.  Since I saw her 4 months ago she continues to do well.  She denies claudication.  Dopplers performed today in our office revealed normal ABIs with a widely patent stent.  She remains on Eliquis and Plavix.  She is trying to diet and exercise and lose weight.   Current Meds  Medication Sig  . apixaban (ELIQUIS) 5 MG TABS tablet Take 1 tablet (5 mg total) by mouth 2 (two) times daily.  Marland Kitchen atorvastatin (LIPITOR) 80 MG tablet Take 80 mg by mouth daily.  Marland Kitchen b complex vitamins capsule Take 1 capsule by mouth daily.  . carvedilol (COREG) 6.25 MG tablet Take 1 tablet (6.25 mg total) by mouth 2 (two) times daily.  . clopidogrel (PLAVIX) 75 MG tablet Take 1 tablet (75 mg total) by mouth daily.  . famotidine (PEPCID AC) 10 MG tablet Take 1 tablet (10 mg total) by mouth 2 (two) times daily.  Marland Kitchen ibuprofen (ADVIL) 400 MG tablet Take 1 tablet (400 mg total) by mouth 3 (three) times daily.  . megestrol (MEGACE) 40 MG tablet Take 1 tablet (40 mg total) by mouth See admin instructions. Take 1 tablet (40 mg) by mouth scheduled once daily. May take additional 40 mg in the event of heavy bleeding.  Marland Kitchen MITIGARE 0.6 MG CAPS Take 0.6 mg by mouth 2 (two) times a day.  . potassium chloride (K-DUR) 10 MEQ tablet Take 1 tablet (10 mEq total) by mouth 3 (three)  times a week.  . valsartan (DIOVAN) 160 MG tablet Take 1 tablet (160 mg total) by mouth daily. TO REPLACE LOSARTAN  . Vitamin D, Ergocalciferol, (DRISDOL) 1.25 MG (50000 UT) CAPS capsule Take 1 capsule (50,000 Units total) by mouth every 7 (seven) days.     Allergies  Allergen Reactions  . Lisinopril Cough    Social History   Socioeconomic History  . Marital status: Single    Spouse name: Not on file   . Number of children: Not on file  . Years of education: Not on file  . Highest education level: Not on file  Occupational History  . Not on file  Social Needs  . Financial resource strain: Not on file  . Food insecurity    Worry: Not on file    Inability: Not on file  . Transportation needs    Medical: Not on file    Non-medical: Not on file  Tobacco Use  . Smoking status: Never Smoker  . Smokeless tobacco: Never Used  Substance and Sexual Activity  . Alcohol use: No  . Drug use: No  . Sexual activity: Yes  Lifestyle  . Physical activity    Days per week: Not on file    Minutes per session: Not on file  . Stress: Not on file  Relationships  . Social Herbalist on phone: Not on file    Gets together: Not on file    Attends religious service: Not on file    Active member of club or organization: Not on file    Attends meetings of clubs or organizations: Not on file    Relationship status: Not on file  . Intimate partner violence    Fear of current or ex partner: Not on file    Emotionally abused: Not on file    Physically abused: Not on file    Forced sexual activity: Not on file  Other Topics Concern  . Not on file  Social History Narrative  . Not on file     Review of Systems: General: negative for chills, fever, night sweats or weight changes.  Cardiovascular: negative for chest pain, dyspnea on exertion, edema, orthopnea, palpitations, paroxysmal nocturnal dyspnea or shortness of breath Dermatological: negative for rash Respiratory: negative for cough or wheezing Urologic: negative for hematuria Abdominal: negative for nausea, vomiting, diarrhea, bright red blood per rectum, melena, or hematemesis Neurologic: negative for visual changes, syncope, or dizziness All other systems reviewed and are otherwise negative except as noted above.    Blood pressure (!) 140/100, pulse 75, temperature (!) 97 F (36.1 C), height 5\' 7"  (1.702 m), weight 281 lb  (127.5 kg).  General appearance: alert and no distress Neck: no adenopathy, no carotid bruit, no JVD, supple, symmetrical, trachea midline and thyroid not enlarged, symmetric, no tenderness/mass/nodules Lungs: clear to auscultation bilaterally Heart: regular rate and rhythm, S1, S2 normal, no murmur, click, rub or gallop Extremities: extremities normal, atraumatic, no cyanosis or edema Pulses: 2+ and symmetric Skin: Skin color, texture, turgor normal. No rashes or lesions Neurologic: Alert and oriented X 3, normal strength and tone. Normal symmetric reflexes. Normal coordination and gait  EKG not performed today  ASSESSMENT AND PLAN:   Critical limb ischemia with history of revascularization of same extremity History of critical ischemia with fairly abrupt onset of leg pain and Dopplers performed 04/28/2018 revealing a right ABI 0.61.  I performed angiography 05/08/2018 revealing an occluded above-knee popliteal artery extending down below the knee  into the proximal anterior tibial, tibioperoneal trunk.  She underwent a long complex procedure with penumbra aspiration thrombectomy followed by balloon angioplasty and implantation of 2 overlapping tiger self-expanding stents in the popliteal artery down to the anterior tibial artery.  Unfortunately, the tibioperoneal trunk remained occluded.  Follow-up Dopplers performed 05/26/2018 revealed a widely patent stent as per the Dopplers today with normal ABIs.  The patient denies claudication remains on Plavix and Eliquis.      Lorretta Harp MD FACP,FACC,FAHA, [facility] 12/12/2018 3:56 PM

## 2018-12-18 ENCOUNTER — Encounter: Payer: Self-pay | Admitting: Family Medicine

## 2018-12-18 DIAGNOSIS — R413 Other amnesia: Secondary | ICD-10-CM

## 2019-01-04 ENCOUNTER — Telehealth (INDEPENDENT_AMBULATORY_CARE_PROVIDER_SITE_OTHER): Payer: BC Managed Care – PPO | Admitting: Neurology

## 2019-01-04 ENCOUNTER — Other Ambulatory Visit: Payer: Self-pay

## 2019-01-04 DIAGNOSIS — R413 Other amnesia: Secondary | ICD-10-CM

## 2019-01-04 NOTE — Progress Notes (Signed)
Virtual Visit via Video Note The purpose of this virtual visit is to provide medical care while limiting exposure to the novel coronavirus.    Consent was obtained for video visit:  Yes.   Answered questions that patient had about telehealth interaction:  Yes.   I discussed the limitations, risks, security and privacy concerns of performing an evaluation and management service by telemedicine. I also discussed with the patient that there may be a patient responsible charge related to this service. The patient expressed understanding and agreed to proceed.  Pt location: Home Physician Location: office Name of referring provider:  Libby Maw,* I connected with Christina [name] at patients initiation/request on 01/04/2019 at 10:30 AM EDT by video enabled telemedicine application and verified that I am speaking with the correct person using two identifiers. Pt MRN:  [id number] Pt DOB:  11-Jan-1966 Video Participants:  Christina [name]   History of Present Illness:  This is a very pleasant 53 year old right-handed woman with a history of hypertension, hyperlipidemia, PAD, presenting for evaluation of memory loss. She started noticing changes 1-2 months ago. She works for [employer] and started noticing that she would be talking to someone, get information, then within a couple of seconds, forget the information. This has been happening more regularly but has not affected work, she has had to write things down more, otherwise she would forget it. She lives alone and denies getting lost driving. She denies missing bill payments or medications. She denies misplacing things frequently. She has burned food on the stove when she gets distracted. Her father had dementia, which has concerned her. No history of significant head injuries or alcohol use. She reports her mood is okay but endorsed stress with her son's situation. She thinks she sleeps well, no daytime drowsiness. She has tingling  in her hands at night. Otherwise, she denies any headaches, dizziness, diplopia, dysarthria/dysphagia, neck/back pain, focal weakness, bowel/bladder dysfunction, anosmia, or tremors. No falls.    PAST MEDICAL HISTORY: Past Medical History:  Diagnosis Date  . Acute blood loss as cause of postoperative anemia   . Chest pain    a. prior coronary CT without CAD.  Marland Kitchen Essential hypertension 07/08/2016  . GERD (gastroesophageal reflux disease)   . PAD (peripheral artery disease) ([place])    a. 05/2018: PV angio subacute thrombotic occlusion of her popliteal artery. She underwent successful penumbra aspiration thrombectomy, PTA and self-expanding stenting using overlapping Tigris self-expanding stents of a long thrombotic occlusion of the popliteal, anterior tibial and tibioperoneal trunk. Procedure c/b pseudoaneurysm/ABL anemia.  . 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[place])   . Uterine fibroid   . Vitamin D deficiency     PAST SURGICAL HISTORY: Past Surgical History:  Procedure Laterality Date  . ABDOMINAL AORTOGRAM W/[name] EXTREMITY Right 05/08/2018   Procedure: ABDOMINAL AORTOGRAM W/[name] EXTREMITY;  Surgeon: Lorretta Harp, MD;  Location: [place] [facility];  Service: Cardiovascular;  Laterality: Right;  . lump removed from breast at age 68    . ovarian cyst removed at 53yr old    . PERIPHERAL VASCULAR INTERVENTION Right 05/08/2018   Procedure: PERIPHERAL VASCULAR INTERVENTION;  Surgeon: Lorretta Harp, MD;  Location: [place] [facility];  Service: Cardiovascular;  Laterality: Right;  Anterior tibial and popliteal stents  . PERIPHERAL VASCULAR THROMBECTOMY Right 05/08/2018   Procedure: PERIPHERAL VASCULAR THROMBECTOMY;  Surgeon: Lorretta Harp, MD;  Location: [place] [facility];  Service: Cardiovascular;  Laterality: Right;  Popliteal, tibioperoneal trunk, Anterior tibial, Posterior  tibial    MEDICATIONS: Current Outpatient Medications on File Prior to Visit  Medication Sig Dispense Refill  .  apixaban (ELIQUIS) 5 MG TABS tablet Take 1 tablet (5 mg total) by mouth 2 (two) times daily. 60 tablet 6  . atorvastatin (LIPITOR) 80 MG tablet Take 80 mg by mouth daily.    Marland Kitchen b complex vitamins capsule Take 1 capsule by mouth daily. 90 capsule 1  . carvedilol (COREG) 6.25 MG tablet Take 1 tablet (6.25 mg total) by mouth 2 (two) times daily. 60 tablet 4  . clopidogrel (PLAVIX) 75 MG tablet Take 1 tablet (75 mg total) by mouth daily. 90 tablet 3  . famotidine (PEPCID AC) 10 MG tablet Take 1 tablet (10 mg total) by mouth 2 (two) times daily. 60 tablet 0  . ibuprofen (ADVIL) 400 MG tablet Take 1 tablet (400 mg total) by mouth 3 (three) times daily. 90 tablet 0  . megestrol (MEGACE) 40 MG tablet Take 1 tablet (40 mg total) by mouth See admin instructions. Take 1 tablet (40 mg) by mouth scheduled once daily. May take additional 40 mg in the event of heavy bleeding. 30 tablet 1  . MITIGARE 0.6 MG CAPS Take 0.6 mg by mouth 2 (two) times a day. 60 capsule 6  . potassium chloride (K-DUR) 10 MEQ tablet Take 1 tablet (10 mEq total) by mouth 3 (three) times a week. 90 tablet 0  . valsartan (DIOVAN) 160 MG tablet Take 1 tablet (160 mg total) by mouth daily. TO REPLACE LOSARTAN 90 tablet 3  . Vitamin D, Ergocalciferol, (DRISDOL) 1.25 MG (50000 UT) CAPS capsule Take 1 capsule (50,000 Units total) by mouth every 7 (seven) days. 25 capsule 1   No current facility-administered medications on file prior to visit.     ALLERGIES: Allergies  Allergen Reactions  . Lisinopril Cough    FAMILY HISTORY: Family History  Problem Relation Age of Onset  . Hypertension Mother   . Dementia Father   . Colon polyps Neg Hx   . Crohn's disease Neg Hx   . Rectal cancer Neg Hx   . Stomach cancer Neg Hx   . Pancreatic cancer Neg Hx      Observations/Objective:   GEN:  The patient appears stated age and is in NAD.  Neurological examination:Patient is awake, alert, oriented x 3. No aphasia or dysarthria. Intact fluency  and comprehension. Remote and recent memory intact. Able to name and repeat. Cranial nerves: Extraocular movements intact with no nystagmus. No facial asymmetry. Motor: moves all extremities symmetrically, at least anti-gravity x 4. No incoordination on finger to nose testing. Gait: narrow-based and steady, able to tandem walk adequately. Negative Romberg test.  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 Her MOCA blind (done over phone) score today is 20/22. We discussed different causes of memory loss. Bloodwork from PCP will be requested for review, check TSH and B12 if not done. MRI brain without contrast will be ordered to assess for underlying structural abnormality and assess vascular load.  She is asking about effects of medications such as statins, if lipid panel normal, she can try holding statin for 2 weeks, and if no change in cognitive issues, restart statin. We discussed how mood can affect memory, she has had chronic stress with son's issues. Neurocognitive testing will be ordered to further evaluate memory concerns. We discussed the importance of control of vascular risk factors, physical exercise, and brain stimulation exercises for brain health. Follow-up in 6 months, she knows to call for any changes.   Follow Up Instructions:   -I discussed the assessment and treatment plan with the patient. The patient was provided an opportunity to ask questions and all were answered. The patient agreed with the plan and demonstrated an  understanding of the instructions.   The patient was advised to call back or seek an in-person evaluation if the symptoms worsen or if the condition fails to improve as anticipated.    Cameron Sprang, MD

## 2019-01-11 ENCOUNTER — Ambulatory Visit: Payer: BC Managed Care – PPO | Admitting: Obstetrics & Gynecology

## 2019-01-11 ENCOUNTER — Other Ambulatory Visit: Payer: Self-pay

## 2019-01-11 DIAGNOSIS — N939 Abnormal uterine and vaginal bleeding, unspecified: Secondary | ICD-10-CM

## 2019-01-11 MED ORDER — MEGESTROL ACETATE 40 MG PO TABS: 40.0000 mg | ORAL_TABLET | ORAL | 2 refills | Status: DC

## 2019-01-12 ENCOUNTER — Telehealth: Payer: Self-pay

## 2019-01-12 ENCOUNTER — Other Ambulatory Visit: Payer: Self-pay

## 2019-01-12 ENCOUNTER — Encounter: Payer: Self-pay | Admitting: Family Medicine

## 2019-01-12 DIAGNOSIS — R413 Other amnesia: Secondary | ICD-10-CM

## 2019-01-12 NOTE — Telephone Encounter (Signed)
[facility] Pre-operative Risk Assessment     Request for surgical clearance:     Colonoscopy Procedure  What type of surgery is being performed?     Colonoscopy  When is this surgery scheduled?     02/07/19  What type of clearance is required ?   Pharmacy  Are there any medications that need to be held prior to surgery and how long? Plavix and Eliquis  5 days  Practice name and name of physician performing surgery?      [facility] - Dr.Armbruster  What is your office phone and fax number?      Phone- [phone]  [phone]  Anesthesia type (None, local, MAC, general) ?       MAC

## 2019-01-12 NOTE — Telephone Encounter (Signed)
Patient with diagnosis of ELIQUIS on ARTERIAL THROMBUS for anticoagulation.    Procedure: COLONOSCOPY Date of procedure: 02-07-2019  CrCl >100ML/MIN Platelet count = 245  Per office protocol, patient can hold ELIQUIS for 2 days  prior to procedure.

## 2019-01-12 NOTE — Telephone Encounter (Signed)
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

## 2019-01-12 NOTE — Telephone Encounter (Signed)
Okay  to HOLD eliquis for only 24 hour prior to procedure instead of lovenox for 1 day?  We are recommending against holding Eliquis for 5 days

## 2019-01-15 ENCOUNTER — Encounter: Payer: Self-pay | Admitting: Family Medicine

## 2019-01-15 ENCOUNTER — Ambulatory Visit (INDEPENDENT_AMBULATORY_CARE_PROVIDER_SITE_OTHER): Payer: BC Managed Care – PPO | Admitting: Family Medicine

## 2019-01-15 DIAGNOSIS — R7303 Prediabetes: Secondary | ICD-10-CM

## 2019-01-15 DIAGNOSIS — M79601 Pain in right arm: Secondary | ICD-10-CM | POA: Diagnosis not present

## 2019-01-15 DIAGNOSIS — E559 Vitamin D deficiency, unspecified: Secondary | ICD-10-CM

## 2019-01-15 DIAGNOSIS — M79602 Pain in left arm: Secondary | ICD-10-CM | POA: Diagnosis not present

## 2019-01-15 MED ORDER — METHOCARBAMOL 500 MG PO TABS: 500.0000 mg | ORAL_TABLET | Freq: Three times a day (TID) | ORAL | 0 refills | Status: DC | PRN

## 2019-01-15 NOTE — Telephone Encounter (Signed)
Yes, patient is to hold Eliquis for 1 day (24 hr) prior to procedure. No need for Lovenox as long as patient only holds Eliquis 1 day.

## 2019-01-15 NOTE — Telephone Encounter (Signed)
Christina [name]  this patient has a history of PAD s/p popliteal/anterior tibial artery stenting for management of a thrombotic occlusion 05/08/2018 can she hold her plavix for 5 days for colonscopy?

## 2019-01-15 NOTE — Progress Notes (Addendum)
Established Patient Office Visit  Subjective:  Patient ID: Christina [name], female    DOB: [birth year]  Age: 53 y.o. MRN: [id number]  CC:  Chief Complaint  Patient presents with  . pain in both arms    HPI Christina [name] presents for evaluation treatment of a 1 month history of bilateral upper arm pain.  It is worse on the right side she is right-hand dominant.  She denies any injury.  Denies over shoulder work.  She works at a computer throughout the day.  She has unable to come in for clinical evaluation because she has no longer has any Pal time.  She had developed a crick in her neck a few weeks ago but that has since resolved.  She sometimes awakes in the morning with numbness in her right hand but that goes away after she gets up in the morning.  Denies any weakness.  Denies any thigh pain or upper leg pain.  She has been taking her vitamin D.  She has been exercising by riding the stationary bike and has been able lose some weight.   Past Medical History:  Diagnosis Date  . Acute blood loss as cause of postoperative anemia   . Chest pain    a. prior coronary CT without CAD.  Marland Kitchen Essential hypertension 07/08/2016  . GERD (gastroesophageal reflux disease)   . PAD (peripheral artery disease) ([place])    a. 05/2018: PV angio subacute thrombotic occlusion of her popliteal artery. She underwent successful penumbra aspiration thrombectomy, PTA and self-expanding stenting using overlapping [place] self-expanding stents of a long thrombotic occlusion of the popliteal, anterior tibial and tibioperoneal trunk. Procedure c/b pseudoaneurysm/ABL anemia.  . 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[place])   . Uterine fibroid   . Vitamin D deficiency     Past Surgical History:  Procedure Laterality Date  . ABDOMINAL AORTOGRAM W/[name] EXTREMITY Right 05/08/2018   Procedure: ABDOMINAL AORTOGRAM W/[name] EXTREMITY;  Surgeon: Lorretta Harp, MD;  Location: [facility];  Service: Cardiovascular;  Laterality:  Right;  . lump removed from breast at age 36    . ovarian cyst removed at 53yr old    . PERIPHERAL VASCULAR INTERVENTION Right 05/08/2018   Procedure: PERIPHERAL VASCULAR INTERVENTION;  Surgeon: Lorretta Harp, MD;  Location: [facility];  Service: Cardiovascular;  Laterality: Right;  Anterior tibial and popliteal stents  . PERIPHERAL VASCULAR THROMBECTOMY Right 05/08/2018   Procedure: PERIPHERAL VASCULAR THROMBECTOMY;  Surgeon: Lorretta Harp, MD;  Location: [facility];  Service: Cardiovascular;  Laterality: Right;  Popliteal, tibioperoneal trunk, Anterior tibial, Posterior tibial    Family History  Problem Relation Age of Onset  . Hypertension Mother   . Dementia Father   . Colon polyps Neg Hx   . Crohn's disease Neg Hx   . Rectal cancer Neg Hx   . Stomach cancer Neg Hx   . Pancreatic cancer Neg Hx     Social History   Socioeconomic History  . Marital status: Single    Spouse name: Not on file  . Number of children: Not on file  . Years of education: Not on file  . Highest education level: Not on file  Occupational History  . Not on file  Social Needs  . Financial resource strain: Not on file  . Food insecurity    Worry: Not on file    Inability: Not on file  . Transportation needs    Medical: Not on file    Non-medical: Not  on file  Tobacco Use  . Smoking status: Never Smoker  . Smokeless tobacco: Never Used  Substance and Sexual Activity  . Alcohol use: No  . Drug use: No  . Sexual activity: Yes  Lifestyle  . Physical activity    Days per week: Not on file    Minutes per session: Not on file  . Stress: Not on file  Relationships  . Social Herbalist on phone: Not on file    Gets together: Not on file    Attends religious service: Not on file    Active member of club or organization: Not on file    Attends meetings of clubs or organizations: Not on file    Relationship status: Not on file  . Intimate partner violence    Fear of  current or ex partner: Not on file    Emotionally abused: Not on file    Physically abused: Not on file    Forced sexual activity: Not on file  Other Topics Concern  . Not on file  Social History Narrative  . Not on file    Outpatient Medications Prior to Visit  Medication Sig Dispense Refill  . apixaban (ELIQUIS) 5 MG TABS tablet Take 1 tablet (5 mg total) by mouth 2 (two) times daily. 60 tablet 6  . atorvastatin (LIPITOR) 80 MG tablet Take 80 mg by mouth daily.    . carvedilol (COREG) 6.25 MG tablet Take 1 tablet (6.25 mg total) by mouth 2 (two) times daily. 60 tablet 4  . clopidogrel (PLAVIX) 75 MG tablet Take 1 tablet (75 mg total) by mouth daily. 90 tablet 3  . megestrol (MEGACE) 40 MG tablet Take 1 tablet (40 mg total) by mouth See admin instructions. Take 1 tablet (40 mg) by mouth scheduled once daily. May take additional 40 mg in the event of heavy bleeding. 30 tablet 2  . potassium chloride (K-DUR) 10 MEQ tablet Take 1 tablet (10 mEq total) by mouth 3 (three) times a week. 90 tablet 0  . valsartan (DIOVAN) 160 MG tablet Take 1 tablet (160 mg total) by mouth daily. TO REPLACE LOSARTAN 90 tablet 3  . vitamin B-12 (CYANOCOBALAMIN) 1000 MCG tablet Take 1,000 mcg by mouth daily.    . Vitamin D, Ergocalciferol, (DRISDOL) 1.25 MG (50000 UT) CAPS capsule Take 1 capsule (50,000 Units total) by mouth every 7 (seven) days. 25 capsule 1   No facility-administered medications prior to visit.     Allergies  Allergen Reactions  . Lisinopril Cough    ROS Review of Systems  Constitutional: Negative.   Respiratory: Negative.   Cardiovascular: Negative.   Gastrointestinal: Negative.   Genitourinary: Negative.   Musculoskeletal: Positive for myalgias. Negative for gait problem, joint swelling, neck pain and neck stiffness.  Skin: Negative for pallor and rash.  Neurological: Negative for weakness, light-headedness, numbness and headaches.  Psychiatric/Behavioral: Negative.        Objective:    Physical Exam  Constitutional: She is oriented to person, place, and time. She appears well-developed and well-nourished. No distress.  HENT:  Head: Normocephalic and atraumatic.  Right Ear: External ear normal.  Left Ear: External ear normal.  Eyes: Right eye exhibits no discharge. Left eye exhibits no discharge. No scleral icterus.  Neck: No JVD present. No tracheal deviation present.  Pulmonary/Chest: Effort normal. No stridor.  Neurological: She is alert and oriented to person, place, and time.  Skin: Skin is warm and dry. She is not diaphoretic.  Psychiatric: She has a normal mood and affect. Her behavior is normal.    There were no vitals taken for this visit. Wt Readings from Last 3 Encounters:  12/12/18 281 lb (127.5 kg)  11/16/18 281 lb (127.5 kg)  10/20/18 281 lb (127.5 kg)   BP Readings from Last 3 Encounters:  12/12/18 (!) 140/100  11/16/18 136/89  10/20/18 124/80   Guideline developer:  UpToDate (see UpToDate for funding source) Date Released: June 2014  Health Maintenance Due  Topic Date Due  . HIV Screening  01/31/1979    There are no preventive care reminders to display for this patient.  Lab Results  Component Value Date   TSH 0.769 05/02/2018   Lab Results  Component Value Date   WBC 7.1 10/20/2018   HGB 13.5 10/20/2018   HCT 40.7 10/20/2018   MCV 91.1 10/20/2018   PLT 245 10/20/2018   Lab Results  Component Value Date   NA 139 10/20/2018   K 4.5 10/20/2018   CO2 21 10/20/2018   GLUCOSE 90 10/20/2018   BUN 11 10/20/2018   CREATININE 0.80 10/20/2018   BILITOT 0.8 10/20/2018   ALKPHOS 120 (H) 09/05/2018   AST 13 10/20/2018   ALT 14 10/20/2018   PROT 7.1 10/20/2018   ALBUMIN 4.1 09/05/2018   CALCIUM 9.6 10/20/2018   ANIONGAP 7 05/10/2018   GFR 100.40 04/03/2018   Lab Results  Component Value Date   CHOL 119 09/05/2018   Lab Results  Component Value Date   HDL 43 09/05/2018   Lab Results  Component Value Date    LDLCALC 55 09/05/2018   Lab Results  Component Value Date   TRIG 106 09/05/2018   Lab Results  Component Value Date   CHOLHDL 2.8 09/05/2018   Lab Results  Component Value Date   HGBA1C 5.8 (H) 10/20/2018      Assessment & Plan:   Problem List Items Addressed This Visit      Other   Vitamin D deficiency - Primary   Pre-diabetes    Other Visit Diagnoses    Pain in both upper extremities       Relevant Medications   methocarbamol (ROBAXIN) 500 MG tablet      Meds ordered this encounter  Medications  . methocarbamol (ROBAXIN) 500 MG tablet    Sig: Take 1 tablet (500 mg total) by mouth every 8 (eight) hours as needed for muscle spasms.    Dispense:  30 tablet    Refill:  0    Follow-up: Return if symptoms worsen or fail to improve.    Patient will take Tylenol and use the Robaxin when needed.  She will follow-up to see me here in the clinic for more thorough evaluation when she is able.  We will recheck her vitamin D and hemoglobin A1c.  Virtual Visit via Video Note  I connected with Christina [name] on 01/15/19 at  4:00 PM EDT by a video enabled telemedicine application and verified that I am speaking with the correct person using two identifiers.  Location: Patient: home Provider:    I discussed the limitations of evaluation and management by telemedicine and the availability of in person appointments. The patient expressed understanding and agreed to proceed.  History of Present Illness:    Observations/Objective:   Assessment and Plan:   Follow Up Instructions:    I discussed the assessment and treatment plan with the patient. The patient was provided an opportunity to ask questions and all were answered.  The patient agreed with the plan and demonstrated an understanding of the instructions.   The patient was advised to call back or seek an in-person evaluation if the symptoms worsen or if the condition fails to improve as anticipated.  I provided 25  minutes of non-face-to-face time during this encounter.   Libby Maw, MD

## 2019-01-15 NOTE — Telephone Encounter (Signed)
I'd prefen that she did not hold plavix

## 2019-01-15 NOTE — Telephone Encounter (Signed)
Pharm- so hold one day and no bridge?  Is that correct?

## 2019-01-15 NOTE — Telephone Encounter (Signed)
That is fine with me Raquel

## 2019-01-16 ENCOUNTER — Encounter: Payer: Self-pay | Admitting: Family Medicine

## 2019-01-16 ENCOUNTER — Telehealth: Payer: Self-pay | Admitting: Gastroenterology

## 2019-01-16 NOTE — Telephone Encounter (Signed)
Patient's colonoscopy cancelled per Dr. Kerin Perna of patient not being cleared to stop her Plavix. Called patient and let her know.

## 2019-01-16 NOTE — Telephone Encounter (Signed)
Thx. . I would hate for her right popliteal and anterior tibial artery stent to occlude

## 2019-01-16 NOTE — Telephone Encounter (Signed)
Please see Dr.Berry's recommendation for patient's anticoag. Prior to Colonoscopy on 02/07/19 at 11:30am. Patient also scheduled for Pre-visit on 01/26/19  Patient to arrive at 4:15pm. Called and gave patient above appts. and Dr. Kennon Holter OK for holding Eliquis ONLY for 24 hours before colonoscopy

## 2019-01-16 NOTE — Telephone Encounter (Signed)
Pt called stating that she was just speaking with you regarding her colonoscopy, she would like to speak with you again. Pls call her.

## 2019-01-16 NOTE — Telephone Encounter (Addendum)
   Please see recommendations below from the Pharmacy Clinic and Dr. Gwenlyn Found. To summarize: 1. Ok to hold Eliquis for 1 day (24 hours) prior to procedure and resume ASAP post op.  Lovenox bridge NOT needed if Eliquis held only 1 day.  2. Dr. Gwenlyn Found wants the patient to remain on Plavix without interruption. Do NOT hold Plavix.  Richardson Dopp, PA-C    01/16/2019 12:21 PM

## 2019-01-16 NOTE — Telephone Encounter (Signed)
Thanks for the note Christina [name]. This patient had previously requested a screening colonoscopy which was fine as she never had one (previously negative Cologuard). In order to do this she needs approval to hold BOTH Eliquis and Plavix for a period of time due to bleeding risk if we remove polyps. If she is not cleared to hold these, which she is not due to her other medical problems, then we cannot proceed with the colonoscopy at this time. Only when she is cleared to hold the Eliquis and Plavix would we proceed with an elective colonoscopy for screening purposes. If she has bleeding symptoms or other issues which require a colonoscopy sooner, please let me know, as that is a different situation, but for screening purposes alone would not proceed at this time. Can you please let her know this, we can reschedule at another time when she receives clearance, whenever that may be, her cardiology team can counsel her on that. Please cancel her colonoscopy in the interim until she receives clearance. thanks.

## 2019-01-18 ENCOUNTER — Other Ambulatory Visit: Payer: Self-pay | Admitting: Physician Assistant

## 2019-01-19 ENCOUNTER — Encounter: Payer: Self-pay | Admitting: Family Medicine

## 2019-01-19 ENCOUNTER — Encounter: Payer: Self-pay | Admitting: Nurse Practitioner

## 2019-01-19 MED ORDER — HYDROCHLOROTHIAZIDE 12.5 MG PO CAPS: 12.5000 mg | ORAL_CAPSULE | Freq: Every day | ORAL | 3 refills | Status: DC

## 2019-01-19 MED ORDER — ATORVASTATIN CALCIUM 80 MG PO TABS: 80.0000 mg | ORAL_TABLET | Freq: Every day | ORAL | 2 refills | Status: DC

## 2019-01-22 ENCOUNTER — Encounter: Payer: Self-pay | Admitting: Family Medicine

## 2019-01-22 ENCOUNTER — Ambulatory Visit (INDEPENDENT_AMBULATORY_CARE_PROVIDER_SITE_OTHER): Payer: BC Managed Care – PPO | Admitting: Family Medicine

## 2019-01-22 ENCOUNTER — Ambulatory Visit: Payer: BC Managed Care – PPO | Admitting: Family Medicine

## 2019-01-22 ENCOUNTER — Other Ambulatory Visit: Payer: Self-pay

## 2019-01-22 ENCOUNTER — Ambulatory Visit (INDEPENDENT_AMBULATORY_CARE_PROVIDER_SITE_OTHER): Payer: BC Managed Care – PPO

## 2019-01-22 ENCOUNTER — Telehealth: Payer: Self-pay | Admitting: Cardiovascular Disease

## 2019-01-22 VITALS — BP 140/100 | HR 108 | Ht 67.0 in | Wt 270.0 lb

## 2019-01-22 DIAGNOSIS — M79601 Pain in right arm: Secondary | ICD-10-CM | POA: Diagnosis not present

## 2019-01-22 DIAGNOSIS — R7303 Prediabetes: Secondary | ICD-10-CM | POA: Diagnosis not present

## 2019-01-22 DIAGNOSIS — M79602 Pain in left arm: Secondary | ICD-10-CM | POA: Insufficient documentation

## 2019-01-22 DIAGNOSIS — Z862 Personal history of diseases of the blood and blood-forming organs and certain disorders involving the immune mechanism: Secondary | ICD-10-CM

## 2019-01-22 DIAGNOSIS — R700 Elevated erythrocyte sedimentation rate: Secondary | ICD-10-CM

## 2019-01-22 DIAGNOSIS — E559 Vitamin D deficiency, unspecified: Secondary | ICD-10-CM

## 2019-01-22 DIAGNOSIS — M50322 Other cervical disc degeneration at C5-C6 level: Secondary | ICD-10-CM | POA: Diagnosis not present

## 2019-01-22 HISTORY — DX: Elevated erythrocyte sedimentation rate: R70.0

## 2019-01-22 HISTORY — DX: Personal history of diseases of the blood and blood-forming organs and certain disorders involving the immune mechanism: Z86.2

## 2019-01-22 LAB — CBC
HCT: 41 % (ref 36.0–46.0)
Hemoglobin: 13.7 g/dL (ref 12.0–15.0)
MCHC: 33.6 g/dL (ref 30.0–36.0)
MCV: 93.3 fl (ref 78.0–100.0)
Platelets: 248 10*3/uL (ref 150.0–400.0)
RBC: 4.39 Mil/uL (ref 3.87–5.11)
RDW: 13.4 % (ref 11.5–15.5)
WBC: 6.2 10*3/uL (ref 4.0–10.5)

## 2019-01-22 LAB — HEMOGLOBIN A1C: Hgb A1c MFr Bld: 6.2 % (ref 4.6–6.5)

## 2019-01-22 LAB — BASIC METABOLIC PANEL
BUN: 8 mg/dL (ref 6–23)
CO2: 22 mEq/L (ref 19–32)
Calcium: 10 mg/dL (ref 8.4–10.5)
Chloride: 108 mEq/L (ref 96–112)
Creatinine, Ser: 0.86 mg/dL (ref 0.40–1.20)
GFR: 82.9 mL/min (ref >60.00)
Glucose, Bld: 94 mg/dL (ref 70–99)
Potassium: 4 mEq/L (ref 3.5–5.1)
Sodium: 140 mEq/L (ref 135–145)

## 2019-01-22 LAB — VITAMIN B12: Vitamin B-12: 426 pg/mL (ref 211–911)

## 2019-01-22 LAB — VITAMIN D 25 HYDROXY (VIT D DEFICIENCY, FRACTURES): VITD: 23.56 ng/mL — ABNORMAL LOW (ref 30.00–100.00)

## 2019-01-22 LAB — SEDIMENTATION RATE: Sed Rate: 63 mm/hr — ABNORMAL HIGH (ref 0–30)

## 2019-01-22 NOTE — Addendum Note (Signed)
Addended by: Jon Billings on: 01/22/2019 03:51 PM   Modules accepted: Orders

## 2019-01-22 NOTE — Telephone Encounter (Signed)
Did not need this encounter °

## 2019-01-22 NOTE — Progress Notes (Addendum)
Established Patient Office Visit  Subjective:  Patient ID: Christina [name], female    DOB: Dec 26, 1965  Age: 53 y.o. MRN: [id number]  CC:  Chief Complaint  Patient presents with  . Arm Pain    HPI Christina [name] presents for follow-up of pain in both of her upper arms for the last few months.  No injury. Has some difficulty with over shoulder work. No thigh pain.   Some neck pain on the right side.  There is been no weakness.  She has some paresthesias in her hands.  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  History of prediabetes.  She has been exercising and changing her diet.  She has been able to lose 11 pounds!  Past Medical History:  Diagnosis Date  . Acute blood loss as cause of postoperative anemia   . Anemia 01/22/2019  . Chest pain    a. prior coronary CT without CAD.  [place] Kitchen Elevated sed rate 01/22/2019  . Essential hypertension 07/08/2016  . GERD (gastroesophageal reflux disease)   . PAD (peripheral artery disease) ([place])    a. 05/2018: PV angio subacute thrombotic occlusion of her popliteal artery. She underwent successful penumbra aspiration thrombectomy, PTA and self-expanding stenting using overlapping Tigris self-expanding stents of a long thrombotic occlusion of the popliteal, anterior tibial and tibioperoneal trunk. Procedure c/b pseudoaneurysm/ABL anemia.  . 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[place])   . Uterine fibroid   . Vitamin D deficiency     Past Surgical History:  Procedure Laterality Date  . ABDOMINAL AORTOGRAM W/LOWER EXTREMITY Right 05/08/2018   Procedure: ABDOMINAL AORTOGRAM W/LOWER EXTREMITY;  Surgeon: Lorretta Harp, MD;  Location: [facility];  Service: Cardiovascular;  Laterality: Right;  . lump removed from breast at age 79    . PERIPHERAL VASCULAR INTERVENTION Right 05/08/2018   Procedure: PERIPHERAL VASCULAR INTERVENTION;  Surgeon: Lorretta Harp, MD;  Location: [facility];  Service: Cardiovascular;  Laterality: Right;   Anterior tibial and popliteal stents  . PERIPHERAL VASCULAR THROMBECTOMY Right 05/08/2018   Procedure: PERIPHERAL VASCULAR THROMBECTOMY;  Surgeon: Lorretta Harp, MD;  Location: [facility];  Service: Cardiovascular;  Laterality: Right;  Popliteal, tibioperoneal trunk, Anterior tibial, Posterior tibial    Family History  Problem Relation Age of Onset  . Hypertension Mother   . Alzheimer's disease Father   . Healthy Son   . Colon polyps Neg Hx   . Crohn's disease Neg Hx   . Rectal cancer Neg Hx   . Stomach cancer Neg Hx   . Pancreatic cancer Neg Hx     Social History   Socioeconomic History  . Marital status: Single    Spouse name: Not on file  . Number of children: Not on file  . Years of education: Not on file  . Highest education level: Not on file  Occupational History  . Not on file  Social Needs  . Financial resource strain: Not on file  . Food insecurity    Worry: Not on file    Inability: Not on file  . Transportation needs    Medical: Not on file    Non-medical: Not on file  Tobacco Use  . Smoking status: Never Smoker  . Smokeless tobacco: Never Used  Substance and Sexual Activity  . Alcohol use: No  . Drug use: No  . Sexual activity: Yes  Lifestyle  . Physical activity    Days per week: Not on file  Minutes per session: Not on file  . Stress: Not on file  Relationships  . Social Herbalist on phone: Not on file    Gets together: Not on file    Attends religious service: Not on file    Active member of club or organization: Not on file    Attends meetings of clubs or organizations: Not on file    Relationship status: Not on file  . Intimate partner violence    Fear of current or ex partner: Not on file    Emotionally abused: Not on file    Physically abused: Not on file    Forced sexual activity: Not on file  Other Topics Concern  . Not on file  Social History Narrative  . Not on file    Outpatient Medications Prior to  Visit  Medication Sig Dispense Refill  . apixaban (ELIQUIS) 5 MG TABS tablet Take 1 tablet (5 mg total) by mouth 2 (two) times daily. 60 tablet 6  . atorvastatin (LIPITOR) 80 MG tablet Take 1 tablet (80 mg total) by mouth daily. 90 tablet 2  . clopidogrel (PLAVIX) 75 MG tablet Take 1 tablet (75 mg total) by mouth daily. 90 tablet 3  . hydrochlorothiazide (MICROZIDE) 12.5 MG capsule Take 1 capsule (12.5 mg total) by mouth daily. 90 capsule 3  . potassium chloride (K-DUR) 10 MEQ tablet Take 1 tablet (10 mEq total) by mouth 3 (three) times a week. 90 tablet 0  . valsartan (DIOVAN) 160 MG tablet Take 1 tablet (160 mg total) by mouth daily. TO REPLACE LOSARTAN 90 tablet 3  . vitamin B-12 (CYANOCOBALAMIN) 1000 MCG tablet Take 1,000 mcg by mouth daily.    . Vitamin D, Ergocalciferol, (DRISDOL) 1.25 MG (50000 UT) CAPS capsule Take 1 capsule (50,000 Units total) by mouth every 7 (seven) days. 25 capsule 1  . carvedilol (COREG) 6.25 MG tablet Take 1 tablet (6.25 mg total) by mouth 2 (two) times daily. 60 tablet 4  . megestrol (MEGACE) 40 MG tablet Take 1 tablet (40 mg total) by mouth See admin instructions. Take 1 tablet (40 mg) by mouth scheduled once daily. May take additional 40 mg in the event of heavy bleeding. 30 tablet 2  . methocarbamol (ROBAXIN) 500 MG tablet Take 1 tablet (500 mg total) by mouth every 8 (eight) hours as needed for muscle spasms. 30 tablet 0   No facility-administered medications prior to visit.     Allergies  Allergen Reactions  . Lisinopril Cough    ROS Review of Systems  Constitutional: Negative for diaphoresis, fatigue, fever and unexpected weight change.  HENT: Negative.   Eyes: Negative for photophobia and visual disturbance.  Respiratory: Negative.   Cardiovascular: Negative.   Gastrointestinal: Negative.   Endocrine: Negative for polyphagia.  Genitourinary: Negative for difficulty urinating and frequency.  Musculoskeletal: Positive for myalgias and neck pain.  Negative for arthralgias and neck stiffness.  Allergic/Immunologic: Negative for immunocompromised state.  Neurological: Positive for numbness. Negative for weakness.  Hematological: Negative for adenopathy.  Psychiatric/Behavioral: Negative.       Objective:    Physical Exam  Constitutional: She is oriented to person, place, and time. She appears well-developed and well-nourished. No distress.  HENT:  Head: Normocephalic and atraumatic.  Right Ear: External ear normal.  Left Ear: External ear normal.  Eyes: Right eye exhibits no discharge. Left eye exhibits no discharge. No scleral icterus.  Neck: Neck supple. No JVD present. No tracheal deviation present. No thyromegaly present.  Cardiovascular: Normal rate, regular rhythm and normal heart sounds.  Pulmonary/Chest: Effort normal and breath sounds normal. No stridor.  Musculoskeletal:     Cervical back: She exhibits normal range of motion, no tenderness and no bony tenderness.     Right hand: Decreased sensation is not present in the ulnar distribution, is not present in the medial distribution and is not present in the radial distribution.     Left hand: Decreased sensation is not present in the ulnar distribution, is not present in the medial redistribution and is not present in the radial distribution.  Lymphadenopathy:    She has no cervical adenopathy.  Neurological: She is alert and oriented to person, place, and time. She has normal strength.  Reflex Scores:      Tricep reflexes are 1+ on the right side and 1+ on the left side.      Bicep reflexes are 1+ on the right side and 1+ on the left side.      Brachioradialis reflexes are 2+ on the right side and 2+ on the left side. Skin: Skin is warm and dry. She is not diaphoretic.  Psychiatric: She has a normal mood and affect.    BP (!) 140/100   Pulse (!) 108   Ht 5\' 7"  (1.702 m)   Wt 270 lb (122.5 kg)   SpO2 98%   BMI 42.29 kg/m  Wt Readings from Last 3 Encounters:   03/02/19 264 lb 6.4 oz (119.9 kg)  02/28/19 265 lb 9.6 oz (120.5 kg)  02/16/19 269 lb (122 kg)   BP Readings from Last 3 Encounters:  03/02/19 116/84  02/28/19 135/84  02/16/19 129/80   Guideline developer:  UpToDate (see UpToDate for funding source) Date Released: June 2014  There are no preventive care reminders to display for this patient.  There are no preventive care reminders to display for this patient.  Lab Results  Component Value Date   TSH 1.070 02/05/2019   Lab Results  Component Value Date   WBC 6.8 03/02/2019   HGB 13.2 03/02/2019   HCT 40.4 03/02/2019   MCV 93.1 03/02/2019   PLT 257 03/02/2019   Lab Results  Component Value Date   NA 136 03/02/2019   K 4.1 03/02/2019   CO2 22 03/02/2019   GLUCOSE 109 (H) 03/02/2019   BUN 15 03/02/2019   CREATININE 0.97 03/02/2019   BILITOT 0.8 03/02/2019   ALKPHOS 89 03/02/2019   AST 14 (L) 03/02/2019   ALT 15 03/02/2019   PROT 7.8 03/02/2019   ALBUMIN 4.5 03/02/2019   CALCIUM 10.0 03/02/2019   ANIONGAP 9 03/02/2019   GFR 82.90 01/22/2019   Lab Results  Component Value Date   CHOL 119 09/05/2018   Lab Results  Component Value Date   HDL 43 09/05/2018   Lab Results  Component Value Date   LDLCALC 55 09/05/2018   Lab Results  Component Value Date   TRIG 106 09/05/2018   Lab Results  Component Value Date   CHOLHDL 2.8 09/05/2018   Lab Results  Component Value Date   HGBA1C 6.2 01/22/2019      Assessment & Plan:   Problem List Items Addressed This Visit      Other   Vitamin D deficiency - Primary   Relevant Orders   VITAMIN D 25 Hydroxy (Vit-D Deficiency, Fractures) (Completed)   Pre-diabetes   Relevant Orders   Hemoglobin A1c (Completed)   Basic metabolic panel (Completed)   Elevated sed rate  Relevant Orders   HIV Antibody (routine testing w rflx)   HIV Antibody (routine testing w rflx) (Completed)   TEST AUTHORIZATION (Completed)   Ambulatory referral to Rheumatology   Anemia    Relevant Orders   CBC (Completed)   Vitamin B12 (Completed)   Iron, TIBC and Ferritin Panel (Completed)   Pain in both upper extremities   Relevant Orders   Sedimentation rate (Completed)   DG Cervical Spine Complete (Completed)   Ambulatory referral to Rheumatology   Ambulatory referral to Sports Medicine      No orders of the defined types were placed in this encounter.   Follow-up: Return in about 6 months (around 07/22/2019).   Congratulated patient on her weight loss encouraged her to keep added.  Expecting improved lab report.  Checking neck film today.  Exam essentially normal today follow-up PRN if things do not improve with the previously ordered months of muscle relaxer.  Sed rate checked today to evaluate for PMR.

## 2019-01-23 ENCOUNTER — Encounter: Payer: Self-pay | Admitting: Family Medicine

## 2019-01-24 ENCOUNTER — Encounter: Payer: Self-pay | Admitting: Family Medicine

## 2019-01-25 LAB — IRON,TIBC AND FERRITIN PANEL
%SAT: 27 % (calc) (ref 16–45)
Ferritin: 39 ng/mL (ref 16–232)
Iron: 88 ug/dL (ref 45–160)
TIBC: 328 mcg/dL (calc) (ref 250–450)

## 2019-01-25 LAB — TEST AUTHORIZATION

## 2019-01-25 LAB — HIV ANTIBODY (ROUTINE TESTING W REFLEX): HIV 1&2 Ab, 4th Generation: NONREACTIVE

## 2019-01-25 NOTE — Addendum Note (Signed)
Addended by: Jon Billings on: 01/25/2019 11:56 AM   Modules accepted: Orders

## 2019-01-26 DIAGNOSIS — I10 Essential (primary) hypertension: Secondary | ICD-10-CM | POA: Diagnosis not present

## 2019-01-26 DIAGNOSIS — R7303 Prediabetes: Secondary | ICD-10-CM | POA: Diagnosis not present

## 2019-01-26 DIAGNOSIS — Z7901 Long term (current) use of anticoagulants: Secondary | ICD-10-CM | POA: Diagnosis not present

## 2019-01-26 DIAGNOSIS — E7800 Pure hypercholesterolemia, unspecified: Secondary | ICD-10-CM | POA: Diagnosis not present

## 2019-01-27 DIAGNOSIS — R7989 Other specified abnormal findings of blood chemistry: Secondary | ICD-10-CM | POA: Diagnosis not present

## 2019-01-27 DIAGNOSIS — R072 Precordial pain: Secondary | ICD-10-CM | POA: Diagnosis not present

## 2019-01-27 DIAGNOSIS — I498 Other specified cardiac arrhythmias: Secondary | ICD-10-CM | POA: Diagnosis not present

## 2019-01-27 DIAGNOSIS — M791 Myalgia, unspecified site: Secondary | ICD-10-CM | POA: Diagnosis not present

## 2019-01-27 DIAGNOSIS — M549 Dorsalgia, unspecified: Secondary | ICD-10-CM | POA: Diagnosis not present

## 2019-01-27 DIAGNOSIS — R079 Chest pain, unspecified: Secondary | ICD-10-CM | POA: Diagnosis not present

## 2019-01-30 ENCOUNTER — Encounter: Payer: BC Managed Care – PPO | Admitting: Gastroenterology

## 2019-01-31 NOTE — Progress Notes (Signed)
Office Visit Note  Patient: Christina [name]             Date of Birth: [birth year]           MRN: [id number]             PCP: Libby Maw, MD Referring: Libby Maw,* Visit Date: 02/05/2019 Occupation: '@GUAROCC' @  Subjective:  Pain in both arms.   History of Present Illness: Christina [name] is a 53 y.o. female seen in consultation per request of Dr. Ethelene Hal.  According to Ms. Vine her symptoms are started about a month ago with pain in her muscles of both arms.  She states she has difficulty lifting her arms and her arms feel achy.  She is also noticed discomfort in her bilateral shoulders and some popping in her shoulder joints.  She denies any history of joint swelling.  She has no discomfort in the lower extremity no difficulty getting on the chair.  None of the other joints or muscles are painful.  She complains of some discomfort in her bilateral elbows.  She states that she had a virtual visit with her PCP at the time the fluid intake was advised.  She did not notice any improvement in her symptoms and at the follow-up visit her sedimentation rate was 63, for that reason she was referred to me.  Activities of Daily Living:  Patient reports morning stiffness for 0 minutes.   Patient Reports nocturnal pain.  Difficulty dressing/grooming: Denies Difficulty climbing stairs: Denies Difficulty getting out of chair: Denies Difficulty using hands for taps, buttons, cutlery, and/or writing: Denies  Review of Systems  Constitutional: Negative for fatigue, night sweats, weight gain and weight loss.  HENT: Negative for mouth sores, trouble swallowing, trouble swallowing, mouth dryness and nose dryness.   Eyes: Negative for pain, redness, itching, visual disturbance and dryness.  Respiratory: Negative for cough, shortness of breath, wheezing and difficulty breathing.   Cardiovascular: Negative for chest pain, palpitations, hypertension, irregular heartbeat and swelling in  legs/feet.  Gastrointestinal: Negative for blood in stool, constipation and diarrhea.  Endocrine: Negative for increased urination.  Genitourinary: Negative for difficulty urinating, painful urination and vaginal dryness.  Musculoskeletal: Positive for arthralgias, joint pain, muscle weakness and muscle tenderness. Negative for joint swelling, myalgias, morning stiffness and myalgias.  Skin: Negative for color change, rash, hair loss, skin tightness, ulcers and sensitivity to sunlight.  Allergic/Immunologic: Negative for susceptible to infections.  Neurological: Positive for headaches and weakness. Negative for dizziness, numbness, memory loss and night sweats.  Hematological: Positive for bruising/bleeding tendency. Negative for swollen glands.  Psychiatric/Behavioral: Positive for sleep disturbance. Negative for depressed mood and confusion. The patient is not nervous/anxious.     PMFS History:  Patient Active Problem List   Diagnosis Date Noted   Elevated sed rate 01/22/2019   History of anemia 01/22/2019   Pain in both upper extremities 01/22/2019   Lightheaded 10/20/2018   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[place])    Acute blood loss as cause of postoperative anemia    Critical limb ischemia with history of revascularization of same extremity 05/08/2018   Peripheral arterial disease ([place]) 04/28/2018   Obesity 07/08/2016   Essential hypertension 07/08/2016   Pre-diabetes 06/24/2016   Uterine fibroid    Vitamin D deficiency    Atypical chest pain     Past Medical History:  Diagnosis Date   Acute blood loss as cause of postoperative anemia    Chest pain    a. prior coronary CT  without CAD.   Essential hypertension 07/08/2016   GERD (gastroesophageal reflux disease)    PAD (peripheral artery disease) ([place])    a. 05/2018: PV angio subacute thrombotic occlusion of her popliteal artery. She underwent successful penumbra aspiration thrombectomy, PTA and  self-expanding stenting using overlapping Tigris self-expanding stents of a long thrombotic occlusion of the popliteal, anterior tibial and tibioperoneal trunk. Procedure c/b pseudoaneurysm/ABL anemia.   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[place])    Uterine fibroid    Vitamin D deficiency     Family History  Problem Relation Age of Onset   Hypertension Mother    Dementia Father    Healthy Son    Colon polyps Neg Hx    Crohn's disease Neg Hx    Rectal cancer Neg Hx    Stomach cancer Neg Hx    Pancreatic cancer Neg Hx    Past Surgical History:  Procedure Laterality Date   ABDOMINAL AORTOGRAM W/LOWER EXTREMITY Right 05/08/2018   Procedure: ABDOMINAL AORTOGRAM W/LOWER EXTREMITY;  Surgeon: Lorretta Harp, MD;  Location: [facility];  Service: Cardiovascular;  Laterality: Right;   lump removed from breast at age 56     PERIPHERAL VASCULAR INTERVENTION Right 05/08/2018   Procedure: PERIPHERAL VASCULAR INTERVENTION;  Surgeon: Lorretta Harp, MD;  Location: [facility];  Service: Cardiovascular;  Laterality: Right;  Anterior tibial and popliteal stents   PERIPHERAL VASCULAR THROMBECTOMY Right 05/08/2018   Procedure: PERIPHERAL VASCULAR THROMBECTOMY;  Surgeon: Lorretta Harp, MD;  Location: [facility];  Service: Cardiovascular;  Laterality: Right;  Popliteal, tibioperoneal trunk, Anterior tibial, Posterior tibial   Social History   Social History Narrative   Not on file   Immunization History  Administered Date(s) Administered   Influenza,inj,Quad PF,6+ Mos 01/31/2017, 04/03/2018, 12/27/2018   Influenza-Unspecified 06/23/2013   Tdap 03/30/2017     Objective: Vital Signs: BP 131/85 (BP Location: Right Arm, Patient Position: Sitting, Cuff Size: Large)    Pulse 76    Resp 15    Ht 5' 5.5" (1.664 m)    Wt 270 lb 12.8 oz (122.8 kg)    BMI 44.38 kg/m    Physical Exam Vitals signs and nursing note reviewed.  Constitutional:      Appearance: She is  well-developed.  HENT:     Head: Normocephalic and atraumatic.  Eyes:     Conjunctiva/sclera: Conjunctivae normal.  Neck:     Musculoskeletal: Normal range of motion.  Cardiovascular:     Rate and Rhythm: Normal rate and regular rhythm.     Heart sounds: Normal heart sounds.  Pulmonary:     Effort: Pulmonary effort is normal.     Breath sounds: Normal breath sounds.  Abdominal:     General: Bowel sounds are normal.     Palpations: Abdomen is soft.  Lymphadenopathy:     Cervical: No cervical adenopathy.  Skin:    General: Skin is warm and dry.     Capillary Refill: Capillary refill takes less than 2 seconds.  Neurological:     Mental Status: She is alert and oriented to person, place, and time.  Psychiatric:        Behavior: Behavior normal.      Musculoskeletal Exam: C-spine was in good range of motion.  She had good range of motion of her shoulder joints without any discomfort.  She had mild tenderness on pressing over her bilateral arms.  No muscular weakness was noted.  Elbow joints, wrist joints, MCPs, PIPs and DIPs  with good range of motion with no synovitis.  She has no difficulty getting out of the chair.  No warmth or swelling was noted in her knee joints, ankle joints or feet.  CDAI Exam: CDAI Score: -- Patient Global: --; Provider Global: -- Swollen: --; Tender: -- Joint Exam   No joint exam has been documented for this visit   There is currently no information documented on the homunculus. Go to the Rheumatology activity and complete the homunculus joint exam.  Investigation: No additional findings.  Imaging: Dg Cervical Spine Complete  Result Date: 01/22/2019 CLINICAL DATA:  Paresthesias involving the bilateral hands. EXAM: CERVICAL SPINE - COMPLETE 4+ VIEW COMPARISON:  None. FINDINGS: C1 to the superior endplate of T1 is imaged on the provided lateral radiograph. There is straightening of the expected cervical lordosis. Minimal (approximately 2 mm) of  retrolisthesis of C4 upon C5. No anterolisthesis. The bilateral facets appear normally aligned. The dens appears normally positioned between the lateral masses of C1. Cervical vertebral body heights are preserved. Prevertebral soft tissues are normal. 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 The bilateral neural foramina appear patent given obliquity. Regional soft tissues appear normal. Limited visualization of the lung apices is normal given technique. 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 Electronically Signed   By: Sandi Mariscal M.D.   On: 01/22/2019 19:29   Mr Brain Wo Contrast  Result Date: 02/04/2019 CLINICAL DATA:  53 year old female with memory loss. EXAM: MRI HEAD WITHOUT CONTRAST TECHNIQUE: Multiplanar, multiecho pulse sequences of the brain and surrounding structures were obtained without intravenous contrast. COMPARISON:  None. FINDINGS: Brain: Cerebral volume appears within normal limits for age. No restricted diffusion or evidence of acute infarction. 10 millimeter cystic change to the pineal gland (series 5, image 16) with no regional mass effect (normal variant). No midline shift, mass effect, evidence of mass lesion, ventriculomegaly, extra-axial collection or acute intracranial hemorrhage. Cervicomedullary junction and pituitary are within normal limits. Mesial temporal lobe structures appear within normal limits. Pearline Cables and white matter signal is within normal limits for age throughout the brain. No cortical encephalomalacia or chronic cerebral blood products. Vascular: Major intracranial vascular flow voids are preserved. The right vertebral artery is dominant and mildly tortuous. Skull and upper cervical spine: Normal visible cervical spine. Visualized bone marrow signal is within normal limits. Sinuses/Orbits:  Negative orbits.  Paranasal sinuses are clear. Other: Mastoids are clear. Visible internal auditory structures appear normal. Scalp and face soft tissues appear negative. IMPRESSION: Noncontrast MRI appearance of the brain is normal for age. Electronically Signed   By: Genevie Ann M.D.   On: 02/04/2019 08:12    Recent Labs: Lab Results  Component Value Date   WBC 6.2 01/22/2019   HGB 13.7 01/22/2019   PLT 248.0 01/22/2019   NA 140 01/22/2019   K 4.0 01/22/2019   CL 108 01/22/2019   CO2 22 01/22/2019   GLUCOSE 94 01/22/2019   BUN 8 01/22/2019   CREATININE 0.86 01/22/2019   BILITOT 0.8 10/20/2018   ALKPHOS 120 (H) 09/05/2018   AST 13 10/20/2018   ALT 14 10/20/2018   PROT 7.1 10/20/2018   ALBUMIN 4.1 09/05/2018   CALCIUM 10.0 01/22/2019   GFRAA 82 07/14/2018    Speciality Comments: No specialty comments available.  Procedures:  No procedures performed Allergies: Lisinopril   Assessment / Plan:  Visit Diagnoses: Myalgia -patient complains of pain in her bilateral arms.  She has no difficulty raising her arms.  She has no muscular weakness.  I will obtain following labs today.  Plan: CK, TSH, Sedimentation rate.  I had brief discussion regarding possibility of polymyalgia rheumatica although she does not have any muscle weakness.  I also discussed possible use of prednisone as a trial.  She is hesitant because she is trying to lose weight and also she is prediabetic.  I will repeat the sed rate and depending on that we can decide to give her a trial of prednisone.  Pain in both upper extremities -she complains of some discomfort in her shoulder joints and her muscles.  I will obtain following labs today.  Plan: Rheumatoid factor, Cyclic citrul peptide antibody, IgG, 14-3-3 eta Protein, ANA  Elevated sed rate - 01/22/19: Sed rate 63, vitamin D 23.56 - Plan: Serum protein electrophoresis with reflex  History of headache-patient gives history of headache off and on for the last few  months.  She describes the headache to be frontal and usually starts after being on computer for long time.  She had no temporal artery tenderness.  She had MRI of her brain yesterday which I reviewed and was within normal limits.  She has follow-up coming up with neurologist.  She denies any visual changes.  Critical limb ischemia with history of revascularization of same extremity - RLE  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[place])  Peripheral arterial disease ([place])  Essential hypertension-her blood pressure is controlled although her diastolic is still mildly elevated.  Vitamin D deficiency-patient's vitamin D is a still low.  She states she has been taking vitamin D supplement.  Pre-diabetes  Orders: Orders Placed This Encounter  Procedures   CK   TSH   Sedimentation rate   Rheumatoid factor   Cyclic citrul peptide antibody, IgG   14-3-3 eta Protein   ANA   Serum protein electrophoresis with reflex   No orders of the defined types were placed in this encounter.   Face-to-face time spent with patient was 45 minutes. Greater than 50% of time was spent in counseling and coordination of care.  Follow-Up Instructions: Return in about 1 week (around 02/12/2019) for Pain in both arms, elevated ESR.   Bo Merino, MD  Note - This record has been created using Editor, commissioning.  Chart creation errors have been sought, but may not always  have been located. Such creation errors do not reflect on  the standard of medical care.

## 2019-02-03 ENCOUNTER — Other Ambulatory Visit: Payer: Self-pay

## 2019-02-03 ENCOUNTER — Ambulatory Visit
Admission: RE | Admit: 2019-02-03 | Discharge: 2019-02-03 | Disposition: A | Discharge status: Home / Self Care | Payer: BC Managed Care – PPO | Source: Ambulatory Visit | Attending: Neurology | Admitting: Neurology

## 2019-02-03 DIAGNOSIS — R413 Other amnesia: Secondary | ICD-10-CM | POA: Diagnosis not present

## 2019-02-05 ENCOUNTER — Other Ambulatory Visit: Payer: Self-pay

## 2019-02-05 ENCOUNTER — Encounter: Payer: Self-pay | Admitting: Rheumatology

## 2019-02-05 ENCOUNTER — Encounter: Payer: Self-pay | Admitting: Cardiovascular Disease

## 2019-02-05 ENCOUNTER — Ambulatory Visit (INDEPENDENT_AMBULATORY_CARE_PROVIDER_SITE_OTHER): Payer: BC Managed Care – PPO | Admitting: Cardiovascular Disease

## 2019-02-05 ENCOUNTER — Ambulatory Visit (INDEPENDENT_AMBULATORY_CARE_PROVIDER_SITE_OTHER): Payer: BC Managed Care – PPO | Admitting: Rheumatology

## 2019-02-05 VITALS — BP 131/85 | HR 76 | Resp 15 | Ht 65.5 in | Wt 270.8 lb

## 2019-02-05 VITALS — BP 128/76 | HR 90 | Ht 65.5 in | Wt 270.0 lb

## 2019-02-05 DIAGNOSIS — M79601 Pain in right arm: Secondary | ICD-10-CM

## 2019-02-05 DIAGNOSIS — I998 Other disorder of circulatory system: Secondary | ICD-10-CM

## 2019-02-05 DIAGNOSIS — E559 Vitamin D deficiency, unspecified: Secondary | ICD-10-CM

## 2019-02-05 DIAGNOSIS — M791 Myalgia, unspecified site: Secondary | ICD-10-CM

## 2019-02-05 DIAGNOSIS — E782 Mixed hyperlipidemia: Secondary | ICD-10-CM | POA: Diagnosis not present

## 2019-02-05 DIAGNOSIS — I70229 Atherosclerosis of native arteries of extremities with rest pain, unspecified extremity: Secondary | ICD-10-CM

## 2019-02-05 DIAGNOSIS — I10 Essential (primary) hypertension: Secondary | ICD-10-CM

## 2019-02-05 DIAGNOSIS — I739 Peripheral vascular disease, unspecified: Secondary | ICD-10-CM

## 2019-02-05 DIAGNOSIS — M1711 Unilateral primary osteoarthritis, right knee: Secondary | ICD-10-CM | POA: Diagnosis not present

## 2019-02-05 DIAGNOSIS — Z87898 Personal history of other specified conditions: Secondary | ICD-10-CM

## 2019-02-05 DIAGNOSIS — M5030 Other cervical disc degeneration, unspecified cervical region: Secondary | ICD-10-CM

## 2019-02-05 DIAGNOSIS — Z959 Presence of cardiac and vascular implant and graft, unspecified: Secondary | ICD-10-CM

## 2019-02-05 DIAGNOSIS — Z9889 Other specified postprocedural states: Secondary | ICD-10-CM

## 2019-02-05 DIAGNOSIS — I729 Aneurysm of unspecified site: Secondary | ICD-10-CM

## 2019-02-05 DIAGNOSIS — M79602 Pain in left arm: Secondary | ICD-10-CM | POA: Diagnosis not present

## 2019-02-05 DIAGNOSIS — R700 Elevated erythrocyte sedimentation rate: Secondary | ICD-10-CM | POA: Diagnosis not present

## 2019-02-05 DIAGNOSIS — R0789 Other chest pain: Secondary | ICD-10-CM

## 2019-02-05 DIAGNOSIS — T81718A Complication of other artery following a procedure, not elsewhere classified, initial encounter: Secondary | ICD-10-CM

## 2019-02-05 DIAGNOSIS — R7303 Prediabetes: Secondary | ICD-10-CM

## 2019-02-05 NOTE — Addendum Note (Signed)
Addended by: Carole Binning on: 02/05/2019 09:08 AM   Modules accepted: Orders

## 2019-02-05 NOTE — Patient Instructions (Signed)

## 2019-02-05 NOTE — Progress Notes (Signed)
Cardiology Office Note   Date:  02/05/2019   ID:  Christina [name], DOB April 12, 1966, MRN [id number]  PCP:  Libby Maw, MD  Cardiologist:   Mertie Moores, MD   Chief Complaint  Patient presents with  . Hypertension   Problem List 1. Chest pain  2. HTN     Christina [name] is a 53 y.o. female who is being seen today for the evaluation of chest pain  at the request of Libby Maw,*.  Has been having CP for the past year.  Tightness.  Lasts for a minute or so.  Not related to exertion, eating , drinking, change of position. Not pleuretic.    Resolves spontaneously after several minutes  Hx of HTN - was on Lisinopril but this caused a cough.   Makes no effort to avoid salt .  Discussed salty foods at length  Does not exercise  Works at [employer]  These episodes have been going in for several years     Has palpitations  - clinically sounds like PVCs  Oct. 9, 2018:  Christina [name] is seen back today for followup of recurrent CP She was seen about a year ago for CP .  Was thought to be atypical . She was seen at her primary MD and has continued to have CP.    Dr. Edilia Bo sent me a message requesting that we see Christina [name] again for continued CP .  Still having cp .  The last episode started while she was at a meeting at work .  They last several minutes,  Starts mid sternal , dull pain . Gets stronger. Radiates up to her jaw,  Stops at her gums. Radiates through to her back  Slowly supsides and goes away .  May last for 10 min. Is not exercising No diaphoresis, no presyncope Does not feel like refulx  .     March 08, 2018: Christina [name] is seen today for follow-up of her chest pain.  Her chest pain has been rather atypical.  She had an upper endoscopy in August, 2019 which was basically unremarkable.  Coronary CT angiogram in Nov. 2018 was normal .    Wt = 285 lbs    S he eats a very poor diet.  She drinks 3 quarts of sugary soda every day.  She  also drinks sweet tea and sugary Kool-Aid like drinks.  She eats a candy bar every night before going to bed.  She eats out for lunch on a daily basis  Oct. 5, 2020 :  Edwinna [name] is seen back for follow up visit Wt. Is 270 lbs.   She developed symptoms of pericarditis.  We started by telemedicine visit in July, 2020.  We predcribed  colchicine at that time but she did not take it  She has dramatically changed her diet.  She is no longer drinking all the soda.  She is also been exercising.  She has been losing weight.  Had a PV procedure Jan. 2020  - has 2 stents in her right leg  She had aspiration thrombectomy and placement of 2 long stents.  She is been maintained on Eliquis and Plavix since that time.  She is 53 years old and has not had her first colonoscopy.  We need to give some thought to holding her Plavix and Eliquis temporarily in order for her to have a colonoscopy.  I will defer to Dr. Gwenlyn Found on this issue.  Past Medical History:  Diagnosis Date  .  Acute blood loss as cause of postoperative anemia   . Chest pain    a. prior coronary CT without CAD.  Marland Kitchen Essential hypertension 07/08/2016  . GERD (gastroesophageal reflux disease)   . PAD (peripheral artery disease) ([place])    a. 05/2018: PV angio subacute thrombotic occlusion of her popliteal artery. She underwent successful penumbra aspiration thrombectomy, PTA and self-expanding stenting using overlapping Tigris self-expanding stents of a long thrombotic occlusion of the popliteal, anterior tibial and tibioperoneal trunk. Procedure c/b pseudoaneurysm/ABL anemia.  . 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[place])   . Uterine fibroid   . Vitamin D deficiency     Past Surgical History:  Procedure Laterality Date  . ABDOMINAL AORTOGRAM W/LOWER EXTREMITY Right 05/08/2018   Procedure: ABDOMINAL AORTOGRAM W/LOWER EXTREMITY;  Surgeon: Lorretta Harp, MD;  Location: [facility];  Service: Cardiovascular;  Laterality: Right;  . lump removed from  breast at age 31    . PERIPHERAL VASCULAR INTERVENTION Right 05/08/2018   Procedure: PERIPHERAL VASCULAR INTERVENTION;  Surgeon: Lorretta Harp, MD;  Location: [facility];  Service: Cardiovascular;  Laterality: Right;  Anterior tibial and popliteal stents  . PERIPHERAL VASCULAR THROMBECTOMY Right 05/08/2018   Procedure: PERIPHERAL VASCULAR THROMBECTOMY;  Surgeon: Lorretta Harp, MD;  Location: [facility];  Service: Cardiovascular;  Laterality: Right;  Popliteal, tibioperoneal trunk, Anterior tibial, Posterior tibial     Current Outpatient Medications  Medication Sig Dispense Refill  . apixaban (ELIQUIS) 5 MG TABS tablet Take 1 tablet (5 mg total) by mouth 2 (two) times daily. 60 tablet 6  . atorvastatin (LIPITOR) 80 MG tablet Take 1 tablet (80 mg total) by mouth daily. 90 tablet 2  . carvedilol (COREG) 6.25 MG tablet Take 1 tablet (6.25 mg total) by mouth 2 (two) times daily. 60 tablet 4  . clopidogrel (PLAVIX) 75 MG tablet Take 1 tablet (75 mg total) by mouth daily. 90 tablet 3  . hydrochlorothiazide (MICROZIDE) 12.5 MG capsule Take 1 capsule (12.5 mg total) by mouth daily. 90 capsule 3  . megestrol (MEGACE) 40 MG tablet Take 1 tablet (40 mg total) by mouth See admin instructions. Take 1 tablet (40 mg) by mouth scheduled once daily. May take additional 40 mg in the event of heavy bleeding. 30 tablet 2  . methocarbamol (ROBAXIN) 500 MG tablet Take 1 tablet (500 mg total) by mouth every 8 (eight) hours as needed for muscle spasms. 30 tablet 0  . potassium chloride (K-DUR) 10 MEQ tablet Take 1 tablet (10 mEq total) by mouth 3 (three) times a week. 90 tablet 0  . valsartan (DIOVAN) 160 MG tablet Take 1 tablet (160 mg total) by mouth daily. TO REPLACE LOSARTAN 90 tablet 3  . vitamin B-12 (CYANOCOBALAMIN) 1000 MCG tablet Take 1,000 mcg by mouth daily.    . Vitamin D, Ergocalciferol, (DRISDOL) 1.25 MG (50000 UT) CAPS capsule Take 1 capsule (50,000 Units total) by mouth every 7 (seven)  days. 25 capsule 1   No current facility-administered medications for this visit.     PAD Screen 04/28/2018 09/06/2016  Previous PAD dx? No No  Previous surgical procedure? No No  Pain with walking? Yes Yes  Subsides with rest? Yes Yes  Feet/toe relief with dangling? No Yes  Painful, non-healing ulcers? No No  Extremities discolored? No No      Allergies:   Lisinopril    Social History:  The patient  reports that she has never smoked. She has never used smokeless tobacco. She reports  that she does not drink alcohol or use drugs.   Family History:  The patient's family history includes Dementia in her father; Healthy in her son; Hypertension in her mother.    ROS:  Please see the history of present illness.    Physical Exam: Blood pressure 128/76, pulse 90, height 5' 5.5" (1.664 m), weight 270 lb (122.5 kg), SpO2 96 %.  GEN:   Morbidly obese female,  NAD  HEENT: Normal NECK: No JVD; No carotid bruits LYMPHATICS: No lymphadenopathy CARDIAC: RRR , no murmurs, rubs, gallops RESPIRATORY:  Clear to auscultation without rales, wheezing or rhonchi  ABDOMEN: Soft, non-tender, non-distended MUSCULOSKELETAL:  No edema; No deformity  SKIN: Warm and dry NEUROLOGIC:  Alert and oriented x 3   EKG:      Recent Labs: 05/02/2018: TSH 0.769 10/20/2018: ALT 14 01/22/2019: BUN 8; Creatinine, Ser 0.86; Hemoglobin 13.7; Platelets 248.0; Potassium 4.0; Sodium 140    Lipid Panel    Component Value Date/Time   CHOL 119 09/05/2018 0809   TRIG 106 09/05/2018 0809   HDL 43 09/05/2018 0809   CHOLHDL 2.8 09/05/2018 0809   CHOLHDL 3.1 05/02/2017 1021   VLDL 18.6 06/23/2016 1516   LDLCALC 55 09/05/2018 0809   LDLCALC 99 05/02/2017 1021      Wt Readings from Last 3 Encounters:  02/05/19 270 lb (122.5 kg)  02/05/19 270 lb 12.8 oz (122.8 kg)  01/22/19 270 lb (122.5 kg)      Other studies Reviewed: Additional studies/ records that were reviewed today include: . Review of the above  records demonstrates:    ASSESSMENT AND PLAN:  1.  Chest pain:   Had a normal Coronary CT angio showed no CAD. Coronary calcium score is 0   We did a virtual visit in July.  At that time I thought her symptoms sound like pericarditis.  We prescribed colchicine but she never took 1.  She stated that the pain resolved on its own. With her coronary calcium score of 0 I have a low suspicion that she has coronary artery disease.  2.  Hypertension: She is on low-dose carvedilol and hydrochlorothiazide.  Her blood pressure looks good.  Continue with diet, exercise, weight loss efforts.  3.  Morbid obesity:   Continue diet, exercise, weight loss  4.  PAD :  She had a long PV procedure with aspiration thrombectomy followed by placement of 2 stents.  She has been maintained on Plavix and Eliquis since that time.  I will defer to Dr. Alvester Chou as to how long she continues Eliquis and if we would be able to falls the Eliquis and Plavix for procedure such as a screening colonoscopy.   We will see her on an as-needed basis.   2. Essential hypertension:          3.  Palpitations:      4. Obesity:       Current medicines are reviewed at length with the patient today.  The patient does not have concerns regarding medicines.  Labs/ tests ordered today include:   Orders Placed This Encounter  Procedures  . Lipid Profile  . Basic Metabolic Panel (BMET)  . Hepatic function panel            Mertie Moores, MD  02/05/2019 5:05 PM    [facility] [address], [place], [place]  [address] Phone: [phone]; Fax: [phone]

## 2019-02-06 ENCOUNTER — Telehealth: Payer: Self-pay | Admitting: *Deleted

## 2019-02-06 NOTE — Telephone Encounter (Addendum)
Called and informed patient of below information r/t MRI. Verbalizes understanding.  Dr. Delice Lesch has seen all bloodwork as was in Epic.  Neuropsych testing is scheduled.   ----- Message from Cameron Sprang, MD sent at 02/05/2019 10:22 AM EDT -----    Pls let her know the MRI brain did not show any tumor, stroke, or bleed, it was normal. Proceed with neuropsych testing as discussed. Also, can you pls request bloodwork from her PCP Dr. Ethelene Hal (specifically TSH, B12, and lipid panel), thanks!

## 2019-02-07 ENCOUNTER — Encounter: Payer: Self-pay | Admitting: Family Medicine

## 2019-02-07 ENCOUNTER — Encounter: Payer: BC Managed Care – PPO | Admitting: Gastroenterology

## 2019-02-07 NOTE — Progress Notes (Signed)
Sed rate is elevated.  We discussed polymyalgia rheumatica at the last visit.  She has appointment coming up on the 12th.  I will discuss results at the follow-up visit.

## 2019-02-07 NOTE — Telephone Encounter (Signed)
Spoke with pt. She is wanting the negative hiv comment removed from mychart, will work on a way to get it removed.

## 2019-02-08 NOTE — Progress Notes (Signed)
Office Visit Note  Patient: Christina [name]             Date of Birth: 02-May-1966           MRN: [id number]             PCP: Libby Maw, MD Referring: Libby Maw,* Visit Date: 02/12/2019 Occupation: '@GUAROCC' @  Subjective:  Discuss lab work   History of Present Illness: Conni Knighton is a 53 y.o. female with history of myalgias and osteoarthritis. She presents today to discuss lab work from her initial office visit.  She denies any muscle aches, muscle tenderness, or muscle weakness at this time.  She denies any difficulty raising arms or getting up from a chair.  Patient states that her symptoms resolved after her initial discomfort.  She denies any joint pain or joint swelling. She denies any new or worsening symptoms. She denies any new concerns.    Activities of Daily Living:  Patient reports morning stiffness for 0  minutes.   Patient Denies nocturnal pain.  Difficulty dressing/grooming: Denies Difficulty climbing stairs: Denies Difficulty getting out of chair: Denies Difficulty using hands for taps, buttons, cutlery, and/or writing: Denies  Review of Systems  Constitutional: Negative for fatigue.  HENT: Negative for mouth sores, mouth dryness and nose dryness.   Eyes: Negative for pain, visual disturbance and dryness.  Respiratory: Negative for cough, hemoptysis, shortness of breath and difficulty breathing.   Cardiovascular: Negative for chest pain, palpitations, hypertension and swelling in legs/feet.  Gastrointestinal: Negative for blood in stool, constipation and diarrhea.  Endocrine: Negative for increased urination.  Genitourinary: Negative for painful urination.  Musculoskeletal: Negative for arthralgias, joint pain, joint swelling, myalgias, muscle weakness, morning stiffness, muscle tenderness and myalgias.  Skin: Negative for color change, pallor, rash, hair loss, nodules/bumps, skin tightness, ulcers and sensitivity to sunlight.    Allergic/Immunologic: Negative for susceptible to infections.  Neurological: Negative for dizziness, numbness, headaches and weakness.  Hematological: Negative for swollen glands.  Psychiatric/Behavioral: Negative for depressed mood and sleep disturbance. The patient is not nervous/anxious.     PMFS History:  Patient Active Problem List   Diagnosis Date Noted   Elevated sed rate 01/22/2019   History of anemia 01/22/2019   Pain in both upper extremities 01/22/2019   Lightheaded 10/20/2018   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[place])    Acute blood loss as cause of postoperative anemia    Critical limb ischemia with history of revascularization of same extremity 05/08/2018   Peripheral arterial disease ([place]) 04/28/2018   Obesity 07/08/2016   Essential hypertension 07/08/2016   Pre-diabetes 06/24/2016   Uterine fibroid    Vitamin D deficiency    Atypical chest pain     Past Medical History:  Diagnosis Date   Acute blood loss as cause of postoperative anemia    Chest pain    a. prior coronary CT without CAD.   Essential hypertension 07/08/2016   GERD (gastroesophageal reflux disease)    PAD (peripheral artery disease) ([place])    a. 05/2018: PV angio subacute thrombotic occlusion of her popliteal artery. She underwent successful penumbra aspiration thrombectomy, PTA and self-expanding stenting using overlapping Tigris self-expanding stents of a long thrombotic occlusion of the popliteal, anterior tibial and tibioperoneal trunk. Procedure c/b pseudoaneurysm/ABL anemia.   Pseudoaneurysm following procedure [facility])    Uterine fibroid    Vitamin D deficiency     Family History  Problem Relation Age of Onset   Hypertension Mother    Dementia Father  Healthy Son    Colon polyps Neg Hx    Crohn's disease Neg Hx    Rectal cancer Neg Hx    Stomach cancer Neg Hx    Pancreatic cancer Neg Hx    Past Surgical History:  Procedure Laterality Date   ABDOMINAL  AORTOGRAM W/LOWER EXTREMITY Right 05/08/2018   Procedure: ABDOMINAL AORTOGRAM W/LOWER EXTREMITY;  Surgeon: Lorretta Harp, MD;  Location: [facility];  Service: Cardiovascular;  Laterality: Right;   lump removed from breast at age 29     PERIPHERAL VASCULAR INTERVENTION Right 05/08/2018   Procedure: PERIPHERAL VASCULAR INTERVENTION;  Surgeon: Lorretta Harp, MD;  Location: [facility];  Service: Cardiovascular;  Laterality: Right;  Anterior tibial and popliteal stents   PERIPHERAL VASCULAR THROMBECTOMY Right 05/08/2018   Procedure: PERIPHERAL VASCULAR THROMBECTOMY;  Surgeon: Lorretta Harp, MD;  Location: [facility];  Service: Cardiovascular;  Laterality: Right;  Popliteal, tibioperoneal trunk, Anterior tibial, Posterior tibial   Social History   Social History Narrative   Not on file   Immunization History  Administered Date(s) Administered   Influenza,inj,Quad PF,6+ Mos 01/31/2017, 04/03/2018, 12/27/2018   Influenza-Unspecified 06/23/2013   Tdap 03/30/2017     Objective: Vital Signs: BP 131/82 (BP Location: Left Arm, Patient Position: Sitting, Cuff Size: Normal)    Pulse 80    Resp 14    Ht 5' 5.5" (1.664 m)    Wt 273 lb 9.6 oz (124.1 kg)    BMI 44.84 kg/m    Physical Exam Vitals signs and nursing note reviewed.  Constitutional:      Appearance: She is well-developed.  HENT:     Head: Normocephalic and atraumatic.  Eyes:     Conjunctiva/sclera: Conjunctivae normal.  Neck:     Musculoskeletal: Normal range of motion.  Cardiovascular:     Rate and Rhythm: Normal rate and regular rhythm.     Heart sounds: Normal heart sounds.  Pulmonary:     Effort: Pulmonary effort is normal.     Breath sounds: Normal breath sounds.  Abdominal:     General: Bowel sounds are normal.     Palpations: Abdomen is soft.  Lymphadenopathy:     Cervical: No cervical adenopathy.  Skin:    General: Skin is warm and dry.     Capillary Refill: Capillary refill takes  less than 2 seconds.  Neurological:     Mental Status: She is alert and oriented to person, place, and time.  Psychiatric:        Behavior: Behavior normal.      Musculoskeletal Exam: C-spine, thoracic spine, and lumbar spine good ROM.  No midline spinal tenderness.  No SI joint tenderness.  Shoulder joints, elbow joints, wrist joints, MCPs, PIPs, and DIPs good ROM with no synovitis.  Complete fist formation bilaterally.  Hip joints, knee joints, ankle joints, MTPs, PIPs, and DIPs good ROM with no synovitis.  No warmth or effusion of knee joints.  No tenderness or swelling of ankle joints.   CDAI Exam: CDAI Score: -- Patient Global: --; Provider Global: -- Swollen: --; Tender: -- Joint Exam   No joint exam has been documented for this visit   There is currently no information documented on the homunculus. Go to the Rheumatology activity and complete the homunculus joint exam.  Investigation: No additional findings. Component     Latest Ref Rng & Units 02/05/2019  Total Protein     6.0 - 8.5 g/dL 7.9  Albumin ELP  2.9 - 4.4 g/dL 3.8  Alpha 1     0.0 - 0.4 g/dL 0.2  Alpha 2     0.4 - 1.0 g/dL 1.2 (H)  Beta     0.7 - 1.3 g/dL 1.1  Gamma Globulin     0.4 - 1.8 g/dL 1.6  M-SPIKE, %     Not Observed g/dL 0.4 (H)  Globulin, Total     2.2 - 3.9 g/dL 4.1 (H)  A/G Ratio     0.7 - 1.7 0.9  Please Note:      Comment  Interpretation(See Below)      .  IgG (Immunoglobin G), Serum     586 - 1,602 mg/dL 1,446  IgA/Immunoglobulin A, Serum     87 - 352 mg/dL 386 (H)  IgM (Immunoglobulin M), Srm     26 - 217 mg/dL 100  IFE 1      Comment  RA Latex Turbid.     0.0 - 13.9 IU/mL 12.9  CK Total     32 - 182 U/L 145  TSH     0.450 - 4.500 uIU/mL 1.070  Sed Rate     0 - 40 mm/hr 77 (H)  Anti Nuclear Antibody (ANA)     Negative Positive (A)  14.3.3 ETA, Rheum. Arthritis      WILL FOLLOW  Cyclic Citrullin Peptide Ab     0 - 19 units 7   Imaging: Dg Cervical Spine  Complete  Result Date: 01/22/2019 CLINICAL DATA:  Paresthesias involving the bilateral hands. EXAM: CERVICAL SPINE - COMPLETE 4+ VIEW COMPARISON:  None. FINDINGS: C1 to the superior endplate of T1 is imaged on the provided lateral radiograph. There is straightening of the expected cervical lordosis. Minimal (approximately 2 mm) of retrolisthesis of C4 upon C5. No anterolisthesis. The bilateral facets appear normally aligned. The dens appears normally positioned between the lateral masses of C1. Cervical vertebral body heights are preserved. Prevertebral soft tissues are normal. 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 The bilateral neural foramina appear patent given obliquity. Regional soft tissues appear normal. Limited visualization of the lung apices is normal given technique. 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 Electronically Signed   By: Sandi Mariscal M.D.   On: 01/22/2019 19:29   Mr Brain Wo Contrast  Result Date: 02/04/2019 CLINICAL DATA:  53 year old female with memory loss. EXAM: MRI HEAD WITHOUT CONTRAST TECHNIQUE: Multiplanar, multiecho pulse sequences of the brain and surrounding structures were obtained without intravenous contrast. COMPARISON:  None. FINDINGS: Brain: Cerebral volume appears within normal limits for age. No restricted diffusion or evidence of acute infarction. 10 millimeter cystic change to the pineal gland (series 5, image 16) with no regional mass effect (normal variant). No midline shift, mass effect, evidence of mass lesion, ventriculomegaly, extra-axial collection or acute intracranial hemorrhage. Cervicomedullary junction and pituitary are within normal limits. Mesial temporal lobe structures appear within normal limits. Pearline Cables and white matter signal is within normal limits for age throughout  the brain. No cortical encephalomalacia or chronic cerebral blood products. Vascular: Major intracranial vascular flow voids are preserved. The right vertebral artery is dominant and mildly tortuous. Skull and upper cervical spine: Normal visible cervical spine. Visualized bone marrow signal is within normal limits. Sinuses/Orbits: Negative orbits.  Paranasal sinuses are clear. Other: Mastoids are clear. Visible internal auditory structures appear normal. Scalp  and face soft tissues appear negative. IMPRESSION: Noncontrast MRI appearance of the brain is normal for age. Electronically Signed   By: Genevie Ann M.D.   On: 02/04/2019 08:12    Recent Labs: Lab Results  Component Value Date   WBC 6.2 01/22/2019   HGB 13.7 01/22/2019   PLT 248.0 01/22/2019   NA 140 01/22/2019   K 4.0 01/22/2019   CL 108 01/22/2019   CO2 22 01/22/2019   GLUCOSE 94 01/22/2019   BUN 8 01/22/2019   CREATININE 0.86 01/22/2019   BILITOT 0.8 10/20/2018   ALKPHOS 120 (H) 09/05/2018   AST 13 10/20/2018   ALT 14 10/20/2018   PROT 7.9 02/05/2019   ALBUMIN 4.1 09/05/2018   CALCIUM 10.0 01/22/2019   GFRAA 82 07/14/2018  February 05, 2019 ESR 77, CK 145, TSH normal, RF negative, anti-CCP negative, '14 3 3 ' at pending, SPEP pending, ANA positive  Speciality Comments: No specialty comments available.  Procedures:  No procedures performed Allergies: Lisinopril   Assessment / Plan:     Visit Diagnoses: Myalgia-patient states the muscle pain she had in her arms has completely resolved.  She states the pain was only intermittent in mostly in her right biceps.  She does not have the typical features of polymyalgia rheumatica.  She has no muscular weakness on examination today.  Elevated sed rate -her sed rate is higher than before.  The ANA is positive but no titer was given.  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  - Plan: Ambulatory referral to Hematology  Positive ANA (antinuclear antibody)-to complete the work-up I will obtain  ANA titer, ENA and C3-C4.  She has no obvious features of lupus.  Primary osteoarthritis of right knee-chronic pain  DDD (degenerative disc disease), cervical-she has chronic stiffness.  Other medical problems are listed as follows:  Critical limb ischemia with history of revascularization of same extremity - RLE  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[place])  Peripheral arterial disease ([place])  Essential hypertension  Vitamin D deficiency  Pre-diabetes  Orders: Orders Placed This Encounter  Procedures   ANA   C3 and C4   Anti-scleroderma antibody   Anti-Smith antibody   Sjogrens syndrome-A extractable nuclear antibody   Sjogrens syndrome-B extractable nuclear antibody   Anti-DNA antibody, double-stranded   RNP Antibodies   Ambulatory referral to Hematology   No orders of the defined types were placed in this encounter.     Follow-Up Instructions: Return in 3 months (on 05/15/2019) for Elevated ESR, positive ANA.   Bo Merino, MD  Note - This record has been created using Editor, commissioning.  Chart creation errors have been sought, but may not always  have been located. Such creation errors do not reflect on  the standard of medical care.

## 2019-02-12 ENCOUNTER — Other Ambulatory Visit: Payer: Self-pay

## 2019-02-12 ENCOUNTER — Telehealth: Payer: Self-pay | Admitting: Hematology

## 2019-02-12 ENCOUNTER — Ambulatory Visit (INDEPENDENT_AMBULATORY_CARE_PROVIDER_SITE_OTHER): Payer: BC Managed Care – PPO | Admitting: Rheumatology

## 2019-02-12 ENCOUNTER — Encounter: Payer: Self-pay | Admitting: Rheumatology

## 2019-02-12 VITALS — BP 131/82 | HR 80 | Resp 14 | Ht 65.5 in | Wt 273.6 lb

## 2019-02-12 DIAGNOSIS — M5030 Other cervical disc degeneration, unspecified cervical region: Secondary | ICD-10-CM

## 2019-02-12 DIAGNOSIS — E559 Vitamin D deficiency, unspecified: Secondary | ICD-10-CM

## 2019-02-12 DIAGNOSIS — I998 Other disorder of circulatory system: Secondary | ICD-10-CM

## 2019-02-12 DIAGNOSIS — R778 Other specified abnormalities of plasma proteins: Secondary | ICD-10-CM | POA: Diagnosis not present

## 2019-02-12 DIAGNOSIS — R7303 Prediabetes: Secondary | ICD-10-CM

## 2019-02-12 DIAGNOSIS — M1711 Unilateral primary osteoarthritis, right knee: Secondary | ICD-10-CM

## 2019-02-12 DIAGNOSIS — M791 Myalgia, unspecified site: Secondary | ICD-10-CM

## 2019-02-12 DIAGNOSIS — I729 Aneurysm of unspecified site: Secondary | ICD-10-CM

## 2019-02-12 DIAGNOSIS — Z9889 Other specified postprocedural states: Secondary | ICD-10-CM

## 2019-02-12 DIAGNOSIS — R700 Elevated erythrocyte sedimentation rate: Secondary | ICD-10-CM

## 2019-02-12 DIAGNOSIS — T81718A Complication of other artery following a procedure, not elsewhere classified, initial encounter: Secondary | ICD-10-CM

## 2019-02-12 DIAGNOSIS — Z959 Presence of cardiac and vascular implant and graft, unspecified: Secondary | ICD-10-CM

## 2019-02-12 DIAGNOSIS — I10 Essential (primary) hypertension: Secondary | ICD-10-CM

## 2019-02-12 DIAGNOSIS — R768 Other specified abnormal immunological findings in serum: Secondary | ICD-10-CM

## 2019-02-12 DIAGNOSIS — I739 Peripheral vascular disease, unspecified: Secondary | ICD-10-CM

## 2019-02-12 DIAGNOSIS — I70229 Atherosclerosis of native arteries of extremities with rest pain, unspecified extremity: Secondary | ICD-10-CM

## 2019-02-12 NOTE — Telephone Encounter (Signed)
Called and spoke with patient regarding appointments scheduled per 10/12 los

## 2019-02-14 ENCOUNTER — Encounter: Payer: Self-pay | Admitting: Obstetrics & Gynecology

## 2019-02-14 ENCOUNTER — Ambulatory Visit: Payer: BC Managed Care – PPO | Admitting: Rheumatology

## 2019-02-14 ENCOUNTER — Other Ambulatory Visit: Payer: Self-pay

## 2019-02-14 ENCOUNTER — Ambulatory Visit (INDEPENDENT_AMBULATORY_CARE_PROVIDER_SITE_OTHER): Payer: BC Managed Care – PPO | Admitting: Obstetrics & Gynecology

## 2019-02-14 VITALS — BP 119/72 | HR 86 | Ht 65.5 in | Wt 270.1 lb

## 2019-02-14 DIAGNOSIS — Z01419 Encounter for gynecological examination (general) (routine) without abnormal findings: Secondary | ICD-10-CM

## 2019-02-14 DIAGNOSIS — N939 Abnormal uterine and vaginal bleeding, unspecified: Secondary | ICD-10-CM

## 2019-02-14 DIAGNOSIS — Z124 Encounter for screening for malignant neoplasm of cervix: Secondary | ICD-10-CM | POA: Diagnosis not present

## 2019-02-14 DIAGNOSIS — N924 Excessive bleeding in the premenopausal period: Secondary | ICD-10-CM

## 2019-02-14 DIAGNOSIS — R8789 Other abnormal findings in specimens from female genital organs: Secondary | ICD-10-CM

## 2019-02-14 DIAGNOSIS — Z1151 Encounter for screening for human papillomavirus (HPV): Secondary | ICD-10-CM | POA: Diagnosis not present

## 2019-02-14 DIAGNOSIS — R87618 Other abnormal cytological findings on specimens from cervix uteri: Secondary | ICD-10-CM

## 2019-02-14 LAB — ANTI-DNA ANTIBODY, DOUBLE-STRANDED: dsDNA Ab: 1 IU/mL (ref 0–9)

## 2019-02-14 LAB — PROTEIN ELECTROPHORESIS, SERUM, WITH REFLEX
A/G Ratio: 0.9 (ref 0.7–1.7)
Albumin ELP: 3.8 g/dL (ref 2.9–4.4)
Alpha 1: 0.2 g/dL (ref 0.0–0.4)
Alpha 2: 1.2 g/dL — ABNORMAL HIGH (ref 0.4–1.0)
Beta: 1.1 g/dL (ref 0.7–1.3)
Gamma Globulin: 1.6 g/dL (ref 0.4–1.8)
Globulin, Total: 4.1 g/dL — ABNORMAL HIGH (ref 2.2–3.9)
Interpretation(See Below): 0
M-Spike, %: 0.4 g/dL — ABNORMAL HIGH
Total Protein: 7.9 g/dL (ref 6.0–8.5)

## 2019-02-14 LAB — IMMUNOFIXATION REFLEX, SERUM
IgA/Immunoglobulin A, Serum: 386 mg/dL — ABNORMAL HIGH (ref 87–352)
IgG (Immunoglobin G), Serum: 1446 mg/dL (ref 586–1602)
IgM (Immunoglobulin M), Srm: 100 mg/dL (ref 26–217)

## 2019-02-14 LAB — 14.3.3 ETA, RHEUM. ARTHRITIS: 14.3.3 ETA, Rheum. Arthritis: <0.2 ng/mL

## 2019-02-14 LAB — ANTI-SMITH ANTIBODY: ENA SM Ab Ser-aCnc: <0.2 AI (ref 0.0–0.9)

## 2019-02-14 LAB — C3 AND C4
Complement C3, Serum: 174 mg/dL — ABNORMAL HIGH (ref 82–167)
Complement C4, Serum: 41 mg/dL (ref 14–44)

## 2019-02-14 LAB — RHEUMATOID FACTOR: Rheumatoid fact SerPl-aCnc: 12.9 IU/mL (ref 0.0–13.9)

## 2019-02-14 LAB — RNP ANTIBODIES: ENA RNP Ab: 5 AI — ABNORMAL HIGH (ref 0.0–0.9)

## 2019-02-14 LAB — ANA
Anti Nuclear Antibody (ANA): POSITIVE — AB
Anti Nuclear Antibody (ANA): POSITIVE — AB

## 2019-02-14 LAB — CYCLIC CITRUL PEPTIDE ANTIBODY, IGG/IGA: Cyclic Citrullin Peptide Ab: 7 units (ref 0–19)

## 2019-02-14 LAB — CK: Total CK: 145 U/L (ref 32–182)

## 2019-02-14 LAB — SJOGRENS SYNDROME-A EXTRACTABLE NUCLEAR ANTIBODY: ENA SSA (RO) Ab: <0.2 AI (ref 0.0–0.9)

## 2019-02-14 LAB — SEDIMENTATION RATE: Sed Rate: 77 mm/hr — ABNORMAL HIGH (ref 0–40)

## 2019-02-14 LAB — ANTI-SCLERODERMA ANTIBODY: Scleroderma (Scl-70) (ENA) Antibody, IgG: <0.2 AI (ref 0.0–0.9)

## 2019-02-14 LAB — TSH: TSH: 1.07 u[IU]/mL (ref 0.450–4.500)

## 2019-02-14 LAB — SJOGRENS SYNDROME-B EXTRACTABLE NUCLEAR ANTIBODY: ENA SSB (LA) Ab: <0.2 AI (ref 0.0–0.9)

## 2019-02-14 MED ORDER — MEGESTROL ACETATE 40 MG PO TABS: 40.0000 mg | ORAL_TABLET | Freq: Two times a day (BID) | ORAL | 3 refills | Status: DC

## 2019-02-14 NOTE — Progress Notes (Signed)
Subjective:     Christina [name] is a 53 y.o. female here for a routine exam. Current complaints: Pt reports that she continues to have bleeding if she stops the Megace. She denies any pelvic pain. She denies and fever or chills or bloating. She reports that she was referred to oncology because her Sed rate is elevated.   Pt reports that she will not have a endometrial biopsy due to the pain it caused previously. She siad tha tshe will still consider that having it in the OR but, is not sure that she wants this,    Gynecologic History No LMP recorded. (Menstrual status: Perimenopausal). Contraception: post menopausal status Last Pap: neg with POS hrHPV. 07/08/2016 Last mammogram: 10/19/2018. Results were: normal  Obstetric History OB History  Gravida Para Term Preterm AB Living  1 1          SAB TAB Ectopic Multiple Live Births          1    # Outcome Date GA Lbr Len/2nd Weight Sex Delivery Anes PTL Lv  1 Para      CS-Unspec        The following portions of the patient's history were reviewed and updated as appropriate: allergies, current medications, past family history, past medical history, past social history, past surgical history and problem list.  Review of Systems Pertinent items are noted in HPI.    Objective:  BP 119/72   Pulse 86   Ht 5' 5.5" (1.664 m)   Wt 270 lb 1.9 oz (122.5 kg)   BMI 44.27 kg/m   General Appearance:    Alert, cooperative, no distress, appears stated age  Head:    Normocephalic, without obvious abnormality, atraumatic  Eyes:    conjunctiva/corneas clear, EOM's intact, both eyes  Ears:    Normal external ear canals, both ears  Nose:   Nares normal, septum midline, mucosa normal, no drainage    or sinus tenderness  Throat:   Lips, mucosa, and tongue normal; teeth and gums normal  Neck:   Supple, symmetrical, trachea midline, no adenopathy;    thyroid:  no enlargement/tenderness/nodules  Back:     Symmetric, no curvature, ROM normal, no CVA  tenderness  Lungs:     respirations unlabored  Chest Wall:    No tenderness or deformity   Heart:    Regular rate and rhythm  Breast Exam:    No tenderness, masses, or nipple abnormality  Abdomen:     Soft, non-tender, bowel sounds active all four quadrants,    no masses, no organomegaly  Genitalia:    Normal female without lesion, discharge or tenderness   uteus enlarged- mobile ?  Extremities:   Extremities normal, at> 20 weeks sized. Exam limited by pts body habitus   Pulses:   2+ and symmetric all extremities;  no cyanosis or edema  Skin:   Skin color, texture, turgor normal, no rashes or lesions    Assessment:    Healthy female exam.   PMPB- pt refused endo bx. I have offered to perform it under anesthesia. She said that she will think about it. I pointed out ot her my concern of endometrial cancer. She has refused prev in the ofc and the OR   Plan:    Megace 40mg  bid    F/u in 3 months or sooner prn F/u PAP Rec pelvic US rec endometrial biopsy  Deuce Paternoster L. Harraway-Smith, M.D., Cherlynn June

## 2019-02-14 NOTE — Progress Notes (Signed)
+  ANA, +RNP . I will discuss at the fu visit.

## 2019-02-15 ENCOUNTER — Encounter: Payer: Self-pay | Admitting: Rheumatology

## 2019-02-15 NOTE — Progress Notes (Deleted)
Office Visit Note  Patient: Christina [name]             Date of Birth: 19-Apr-1966           MRN: [id number]             PCP: Libby Maw, MD Referring: Libby Maw,* Visit Date: 02/19/2019 Occupation: @GUAROCC @  Subjective:  No chief complaint on file.   History of Present Illness: Christina [name] is a 53 y.o. female ***   Activities of Daily Living:  Patient reports morning stiffness for *** {minute/hour:19697}.   Patient {ACTIONS;DENIES/REPORTS:21021675::"Denies"} nocturnal pain.  Difficulty dressing/grooming: {ACTIONS;DENIES/REPORTS:21021675::"Denies"} Difficulty climbing stairs: {ACTIONS;DENIES/REPORTS:21021675::"Denies"} Difficulty getting out of chair: {ACTIONS;DENIES/REPORTS:21021675::"Denies"} Difficulty using hands for taps, buttons, cutlery, and/or writing: {ACTIONS;DENIES/REPORTS:21021675::"Denies"}  No Rheumatology ROS completed.   PMFS History:  Patient Active Problem List   Diagnosis Date Noted  . Elevated sed rate 01/22/2019  . History of anemia 01/22/2019  . Pain in both upper extremities 01/22/2019  . Lightheaded 10/20/2018  . 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[place])   . Acute blood loss as cause of postoperative anemia   . Critical limb ischemia with history of revascularization of same extremity 05/08/2018  . Peripheral arterial disease ([place]) 04/28/2018  . Obesity 07/08/2016  . Essential hypertension 07/08/2016  . Pre-diabetes 06/24/2016  . Uterine fibroid   . Vitamin D deficiency   . Atypical chest pain     Past Medical History:  Diagnosis Date  . Acute blood loss as cause of postoperative anemia   . Chest pain    a. prior coronary CT without CAD.  Marland Kitchen Essential hypertension 07/08/2016  . GERD (gastroesophageal reflux disease)   . PAD (peripheral artery disease) ([place])    a. 05/2018: PV angio subacute thrombotic occlusion of her popliteal artery. She underwent successful penumbra aspiration thrombectomy, PTA and self-expanding  stenting using overlapping [place] self-expanding stents of a long thrombotic occlusion of the popliteal, anterior tibial and tibioperoneal trunk. Procedure c/b pseudoaneurysm/ABL anemia.  . 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[place])   . Uterine fibroid   . Vitamin D deficiency     Family History  Problem Relation Age of Onset  . Hypertension Mother   . Dementia Father   . Healthy Son   . Colon polyps Neg Hx   . Crohn's disease Neg Hx   . Rectal cancer Neg Hx   . Stomach cancer Neg Hx   . Pancreatic cancer Neg Hx    Past Surgical History:  Procedure Laterality Date  . ABDOMINAL AORTOGRAM W/LOWER EXTREMITY Right 05/08/2018   Procedure: ABDOMINAL AORTOGRAM W/LOWER EXTREMITY;  Surgeon: Lorretta Harp, MD;  Location: [facility];  Service: Cardiovascular;  Laterality: Right;  . lump removed from breast at age 53    . PERIPHERAL VASCULAR INTERVENTION Right 05/08/2018   Procedure: PERIPHERAL VASCULAR INTERVENTION;  Surgeon: Lorretta Harp, MD;  Location: [facility];  Service: Cardiovascular;  Laterality: Right;  Anterior tibial and popliteal stents  . PERIPHERAL VASCULAR THROMBECTOMY Right 05/08/2018   Procedure: PERIPHERAL VASCULAR THROMBECTOMY;  Surgeon: Lorretta Harp, MD;  Location: [facility];  Service: Cardiovascular;  Laterality: Right;  Popliteal, tibioperoneal trunk, Anterior tibial, Posterior tibial   Social History   Social History Narrative  . Not on file   Immunization History  Administered Date(s) Administered  . Influenza,inj,Quad PF,6+ Mos 01/31/2017, 04/03/2018, 12/27/2018  . Influenza-Unspecified 06/23/2013  . Tdap 03/30/2017     Objective: Vital Signs: There were no vitals taken for this visit.  Physical Exam   Musculoskeletal Exam: ***  CDAI Exam: CDAI Score: - Patient Global: -; Provider Global: - Swollen: -; Tender: - Joint Exam   No joint exam has been documented for this visit   There is currently no information documented  on the homunculus. Go to the Rheumatology activity and complete the homunculus joint exam.  Investigation: No additional findings.  Imaging: Dg Cervical Spine Complete  Result Date: 01/22/2019 CLINICAL DATA:  Paresthesias involving the bilateral hands. EXAM: CERVICAL SPINE - COMPLETE 4+ VIEW COMPARISON:  None. FINDINGS: C1 to the superior endplate of T1 is imaged on the provided lateral radiograph. There is straightening of the expected cervical lordosis. Minimal (approximately 2 mm) of retrolisthesis of C4 upon C5. No anterolisthesis. The bilateral facets appear normally aligned. The dens appears normally positioned between the lateral masses of C1. Cervical vertebral body heights are preserved. Prevertebral soft tissues are normal. 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 The bilateral neural foramina appear patent given obliquity. Regional soft tissues appear normal. Limited visualization of the lung apices is normal given technique. 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 Electronically Signed   By: Sandi Mariscal M.D.   On: 01/22/2019 19:29   Mr Brain Wo Contrast  Result Date: 02/04/2019 CLINICAL DATA:  53 year old female with memory loss. EXAM: MRI HEAD WITHOUT CONTRAST TECHNIQUE: Multiplanar, multiecho pulse sequences of the brain and surrounding structures were obtained without intravenous contrast. COMPARISON:  None. FINDINGS: Brain: Cerebral volume appears within normal limits for age. No restricted diffusion or evidence of acute infarction. 10 millimeter cystic change to the pineal gland (series 5, image 16) with no regional mass effect (normal variant). No midline shift, mass effect, evidence of mass lesion, ventriculomegaly, extra-axial collection or acute intracranial hemorrhage. Cervicomedullary junction and  pituitary are within normal limits. Mesial temporal lobe structures appear within normal limits. Pearline Cables and white matter signal is within normal limits for age throughout the brain. No cortical encephalomalacia or chronic cerebral blood products. Vascular: Major intracranial vascular flow voids are preserved. The right vertebral artery is dominant and mildly tortuous. Skull and upper cervical spine: Normal visible cervical spine. Visualized bone marrow signal is within normal limits. Sinuses/Orbits: Negative orbits.  Paranasal sinuses are clear. Other: Mastoids are clear. Visible internal auditory structures appear normal. Scalp and face soft tissues appear negative. IMPRESSION: Noncontrast MRI appearance of the brain is normal for age. Electronically Signed   By: Genevie Ann M.D.   On: 02/04/2019 08:12    Recent Labs: Lab Results  Component Value Date   WBC 6.2 01/22/2019   HGB 13.7 01/22/2019   PLT 248.0 01/22/2019   NA 140 01/22/2019   K 4.0 01/22/2019   CL 108 01/22/2019   CO2 22 01/22/2019   GLUCOSE 94 01/22/2019   BUN 8 01/22/2019   CREATININE 0.86 01/22/2019   BILITOT 0.8 10/20/2018   ALKPHOS 120 (H) 09/05/2018   AST 13 10/20/2018   ALT 14 10/20/2018   PROT 7.9 02/05/2019   ALBUMIN 4.1 09/05/2018   CALCIUM 10.0 01/22/2019   GFRAA 82 07/14/2018    Speciality Comments: No specialty comments available.  Procedures:  No procedures performed Allergies: Lisinopril   Assessment / Plan:     Visit Diagnoses: No diagnosis found.  Orders: No orders of the defined types were placed in this encounter.  No orders of the defined  types were placed in this encounter.   Face-to-face time spent with patient was *** minutes. Greater than 50% of time was spent in counseling and coordination of care.  Follow-Up Instructions: No follow-ups on file.   Ofilia Neas, PA-C  Note - This record has been created using Dragon software.  Chart creation errors have been sought, but may not always   have been located. Such creation errors do not reflect on  the standard of medical care.

## 2019-02-15 NOTE — Progress Notes (Signed)
Office Visit Note  Patient: Christina [name]             Date of Birth: Jul 14, 1965           MRN: [id number]             PCP: Libby Maw, MD Referring: Libby Maw,* Visit Date: 02/16/2019 Occupation: @GUAROCC @  Subjective:  Discuss lab work   History of Present Illness: Christina [name] is a 53 y.o. female with history of myalgia, osteoarthritis, and positive ANA.  She presents today to discuss recent lab work obtained on 02/12/2019.  She denies any joint pain or joint swelling at this time.  She continues have generalized muscle aches and muscle tenderness.  She denies any muscle weakness.  She has not had any recent facial rashes or skin lesions.  She has not had any photosensitivity or hair loss.  She denies any symptoms of raynaud's.  She denies any oral or nasal ulcerations.  She has not had any sicca symptoms.  She denies any fatigue, fever, or swollen lymph nodes.  She denies any chest pain, shortness of breath, or palpitations.  She states that she has been experiencing some intermittent dizziness but denies any congestion or vision changes.  She states that she has had a mild headache.     Activities of Daily Living:  Patient reports morning stiffness for 0 minutes.   Patient Reports nocturnal pain.  Difficulty dressing/grooming: Denies Difficulty climbing stairs: Denies Difficulty getting out of chair: Denies Difficulty using hands for taps, buttons, cutlery, and/or writing: Denies  Review of Systems  Constitutional: Negative for fatigue.  HENT: Negative for mouth sores, mouth dryness and nose dryness.   Eyes: Negative for pain, itching, visual disturbance and dryness.  Respiratory: Negative for cough, hemoptysis, shortness of breath, wheezing and difficulty breathing.   Cardiovascular: Negative for chest pain, palpitations, hypertension and swelling in legs/feet.  Gastrointestinal: Negative for abdominal pain, blood in stool, constipation and diarrhea.    Endocrine: Negative for increased urination.  Genitourinary: Negative for difficulty urinating and painful urination.  Musculoskeletal: Positive for muscle weakness. Negative for arthralgias, joint pain, joint swelling, myalgias, morning stiffness, muscle tenderness and myalgias.  Skin: Negative for color change, pallor, rash, hair loss, nodules/bumps, skin tightness, ulcers and sensitivity to sunlight.  Allergic/Immunologic: Negative for susceptible to infections.  Neurological: Negative for dizziness, numbness and memory loss.  Hematological: Negative for swollen glands.  Psychiatric/Behavioral: Positive for sleep disturbance. Negative for depressed mood and confusion. The patient is not nervous/anxious.     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 prior coronary CT without CAD.   Essential hypertension 07/08/2016   GERD (gastroesophageal reflux disease)    PAD (peripheral artery disease) ([place])    a. 05/2018: PV angio subacute  thrombotic occlusion of her popliteal artery. She underwent successful penumbra aspiration thrombectomy, PTA and self-expanding stenting using overlapping [place] self-expanding stents of a long thrombotic occlusion of the popliteal, anterior tibial and tibioperoneal trunk. Procedure c/b  pseudoaneurysm/ABL anemia.   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[place])    Uterine fibroid    Vitamin D deficiency     Family History  Problem Relation Age of Onset   Hypertension Mother    Dementia Father    Healthy Son    Colon polyps Neg Hx    Crohn's disease Neg Hx    Rectal cancer Neg Hx    Stomach cancer Neg Hx    Pancreatic cancer Neg Hx    Past Surgical History:  Procedure Laterality Date   ABDOMINAL AORTOGRAM W/LOWER EXTREMITY Right 05/08/2018   Procedure: ABDOMINAL AORTOGRAM W/LOWER EXTREMITY;  Surgeon: Lorretta Harp, MD;  Location: [facility];  Service: Cardiovascular;  Laterality: Right;   lump removed from breast at age 55     PERIPHERAL VASCULAR INTERVENTION Right 05/08/2018   Procedure: PERIPHERAL VASCULAR INTERVENTION;  Surgeon: Lorretta Harp, MD;  Location: [facility];  Service: Cardiovascular;  Laterality: Right;  Anterior tibial and popliteal stents   PERIPHERAL VASCULAR THROMBECTOMY Right 05/08/2018   Procedure: PERIPHERAL VASCULAR THROMBECTOMY;  Surgeon: Lorretta Harp, MD;  Location: [facility];  Service: Cardiovascular;  Laterality: Right;  Popliteal, tibioperoneal trunk, Anterior tibial, Posterior tibial   Social History   Social History Narrative   Not on file   Immunization History  Administered Date(s) Administered   Influenza,inj,Quad PF,6+ Mos 01/31/2017, 04/03/2018, 12/27/2018   Influenza-Unspecified 06/23/2013   Tdap 03/30/2017     Objective: Vital Signs: BP 129/80 (BP Location: Left Arm, Patient Position: Sitting, Cuff Size: Large)    Pulse 82    Resp 15    Ht 5\' 5"  (1.651 m)    Wt 269 lb (122 kg)    BMI 44.76 kg/m    Physical Exam Vitals signs and nursing note reviewed.  Constitutional:      Appearance: She is well-developed.  HENT:     Head: Normocephalic and atraumatic.  Eyes:     Conjunctiva/sclera: Conjunctivae normal.  Neck:     Musculoskeletal: Normal range of motion.    Cardiovascular:     Rate and Rhythm: Normal rate and regular rhythm.     Heart sounds: Normal heart sounds.  Pulmonary:     Effort: Pulmonary effort is normal.     Breath sounds: Normal breath sounds.  Abdominal:     General: Bowel sounds are normal.     Palpations: Abdomen is soft.  Lymphadenopathy:     Cervical: No cervical adenopathy.  Skin:    General: Skin is warm and dry.     Capillary Refill: Capillary refill takes less than 2 seconds.  Neurological:     Mental Status: She is alert and oriented to person, place, and time.  Psychiatric:        Behavior: Behavior normal.      Musculoskeletal Exam: C-spine, thoracic spine, lumbar spine good range of motion.  No midline spinal tenderness.  No SI joint tenderness.  Shoulder joints, elbows, wrist joints, MCPs, PIPs and DIPs good range of motion no synovitis.  Hip joints, knee joints and ankle joints, MTPs, PIPs and DIPs good range of motion no synovitis.  No warmth or effusion of bilateral knee joints.  She has pedal edema bilaterally.  CDAI Exam: CDAI Score: -- Patient Global: --;  Provider Global: -- Swollen: --; Tender: -- Joint Exam   No joint exam has been documented for this visit   There is currently no information documented on the homunculus. Go to the Rheumatology activity and complete the homunculus joint exam.  Investigation: No additional findings.  Imaging: Dg Cervical Spine Complete  Result Date: 01/22/2019 CLINICAL DATA:  Paresthesias involving the bilateral hands. EXAM: CERVICAL SPINE - COMPLETE 4+ VIEW COMPARISON:  None. FINDINGS: C1 to the superior endplate of T1 is imaged on the provided lateral radiograph. There is straightening of the expected cervical lordosis. Minimal (approximately 2 mm) of retrolisthesis of C4 upon C5. No anterolisthesis. The bilateral facets appear normally aligned. The dens appears normally positioned between the lateral masses of C1. Cervical vertebral body heights are preserved.  Prevertebral soft tissues are normal. 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 The bilateral neural foramina appear patent given obliquity. Regional soft tissues appear normal. Limited visualization of the lung apices is normal given technique. 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 Electronically Signed   By: Sandi Mariscal M.D.   On: 01/22/2019 19:29   Mr Brain Wo Contrast  Result Date: 02/04/2019 CLINICAL DATA:  53 year old female with memory loss. EXAM: MRI HEAD WITHOUT CONTRAST TECHNIQUE: Multiplanar, multiecho pulse sequences of the brain and surrounding structures were obtained without intravenous contrast. COMPARISON:  None. FINDINGS: Brain: Cerebral volume appears within normal limits for age. No restricted diffusion or evidence of acute infarction. 10 millimeter cystic change to the pineal gland (series 5, image 16) with no regional mass effect (normal variant). No midline shift, mass effect, evidence of mass lesion, ventriculomegaly, extra-axial collection or acute intracranial hemorrhage. Cervicomedullary junction and pituitary are within normal limits. Mesial temporal lobe structures appear within normal limits. Pearline Cables and white matter signal is within normal limits for age throughout the brain. No cortical encephalomalacia or chronic cerebral blood products. Vascular: Major intracranial vascular flow voids are preserved. The right vertebral artery is dominant and mildly tortuous. Skull and upper cervical spine: Normal visible cervical spine. Visualized bone marrow signal is within normal limits. Sinuses/Orbits: Negative orbits.  Paranasal sinuses are clear. Other: Mastoids are clear. Visible internal auditory structures appear normal. Scalp and face soft tissues appear negative. IMPRESSION: Noncontrast MRI appearance of the  brain is normal for age. Electronically Signed   By: Genevie Ann M.D.   On: 02/04/2019 08:12    Recent Labs: Lab Results  Component Value Date   WBC 6.2 01/22/2019   HGB 13.7 01/22/2019   PLT 248.0 01/22/2019   NA 140 01/22/2019   K 4.0 01/22/2019   CL 108 01/22/2019   CO2 22 01/22/2019   GLUCOSE 94 01/22/2019   BUN 8 01/22/2019   CREATININE 0.86 01/22/2019   BILITOT 0.8 10/20/2018   ALKPHOS 120 (H) 09/05/2018   AST 13 10/20/2018   ALT 14 10/20/2018   PROT 7.9 02/05/2019   ALBUMIN 4.1 09/05/2018   CALCIUM 10.0 01/22/2019   GFRAA 82 07/14/2018    Speciality Comments: No specialty comments available.  Procedures:  No procedures performed Allergies: Lisinopril   Assessment / Plan:     Visit Diagnoses: Myalgia: She has persistent muscle aches and muscle tenderness.  She has no muscle weakness on exam.  CK is WNL-145 on 02/05/19.  She was advised to notify us if she develops new or worsening symptoms.  Elevated sed rate - sed rate is higher than before.  The ANA is positive but no titer was given.  01/22/19: Sed rate 63, 10/5: Sed rate 77, RF 12.9, CK 145, TSH 1.07, ANA+, CCP 7.  She was referred to hematology.   Positive sm/RNP antibody - 02/12/2019 RNP 5.0, C3 174, C4 41-Lab work from 02/12/19 was reviewed with the patient today.    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  Referred to hematology on 02/12/2019.  Positive ANA (antinuclear antibody): ANA positive, no titer, dsDNA-, Ro-, La-, RNP 5.0, smith-, Scl-70-, C3 174 and C4 41: She has no clinical features of autoimmune disease at this time.  [facility] labs will be obtained.  She will follow up in 2 months to discuss [place] lab work.  She was advised to notify us if she develops any new or worsening symptoms.   Primary osteoarthritis of right knee: She has good ROM with no discomfort.  No warmth or effusion of knee joints.    DDD (degenerative disc disease), cervical: She has  good range of motion with some discomfort with lateral rotation to the right.  She has no symptoms of radiculopathy.  She is given a handout of neck exercises to perform.  She can also try using topical agent such as Biofreeze or salon pause patches for pain relief.  We also discussed massage therapy.   Other medical conditions are listed as follows:  Critical limb ischemia with history of revascularization of same extremity - RLE  Peripheral arterial disease ([place])  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[place])  Vitamin D deficiency  Essential hypertension  Pre-diabetes  Orders: No orders of the defined types were placed in this encounter.  No orders of the defined types were placed in this encounter.   Face-to-face time spent with patient was 30 minutes. Greater than 50% of time was spent in counseling and coordination of care.  Follow-Up Instructions: Return in 2 months (on 04/18/2019) for Myalgia, Positive ANA, Osteoarthritis.   Ofilia Neas, PA-C   I examined and evaluated the patient with [name] PA.  Patient has positive ANA and positive RNP.  ANA titer is not available.  Complements are normal and sed rate are normal.  She has no clinical features of autoimmune disease at this point.  She is very much concerned about the abnormal labs.  We will obtain[facility]ISE labs in couple of months.  Have advised her to contact me in case she develops any new symptoms.  She also has appointment with hematology for abnormal SPEP.  The plan of care was discussed as noted above.  Bo Merino, MD  Note - This record has been created using Editor, commissioning.  Chart creation errors have been sought, but may not always  have been located. Such creation errors do not reflect on  the standard of medical care.

## 2019-02-16 ENCOUNTER — Encounter: Payer: Self-pay | Admitting: Rheumatology

## 2019-02-16 ENCOUNTER — Ambulatory Visit (HOSPITAL_BASED_OUTPATIENT_CLINIC_OR_DEPARTMENT_OTHER)
Admission: RE | Admit: 2019-02-16 | Discharge: 2019-02-16 | Disposition: A | Discharge status: Home / Self Care | Payer: BC Managed Care – PPO | Source: Ambulatory Visit | Attending: Obstetrics & Gynecology | Admitting: Obstetrics & Gynecology

## 2019-02-16 ENCOUNTER — Other Ambulatory Visit: Payer: Self-pay

## 2019-02-16 ENCOUNTER — Ambulatory Visit (HOSPITAL_BASED_OUTPATIENT_CLINIC_OR_DEPARTMENT_OTHER): Payer: BC Managed Care – PPO

## 2019-02-16 ENCOUNTER — Ambulatory Visit (INDEPENDENT_AMBULATORY_CARE_PROVIDER_SITE_OTHER): Payer: BC Managed Care – PPO | Admitting: Rheumatology

## 2019-02-16 VITALS — BP 129/80 | HR 82 | Resp 15 | Ht 65.0 in | Wt 269.0 lb

## 2019-02-16 DIAGNOSIS — R768 Other specified abnormal immunological findings in serum: Secondary | ICD-10-CM

## 2019-02-16 DIAGNOSIS — R7303 Prediabetes: Secondary | ICD-10-CM

## 2019-02-16 DIAGNOSIS — E559 Vitamin D deficiency, unspecified: Secondary | ICD-10-CM

## 2019-02-16 DIAGNOSIS — Z959 Presence of cardiac and vascular implant and graft, unspecified: Secondary | ICD-10-CM

## 2019-02-16 DIAGNOSIS — M5030 Other cervical disc degeneration, unspecified cervical region: Secondary | ICD-10-CM

## 2019-02-16 DIAGNOSIS — I70229 Atherosclerosis of native arteries of extremities with rest pain, unspecified extremity: Secondary | ICD-10-CM

## 2019-02-16 DIAGNOSIS — R778 Other specified abnormalities of plasma proteins: Secondary | ICD-10-CM | POA: Diagnosis not present

## 2019-02-16 DIAGNOSIS — I729 Aneurysm of unspecified site: Secondary | ICD-10-CM

## 2019-02-16 DIAGNOSIS — M791 Myalgia, unspecified site: Secondary | ICD-10-CM | POA: Diagnosis not present

## 2019-02-16 DIAGNOSIS — N924 Excessive bleeding in the premenopausal period: Secondary | ICD-10-CM

## 2019-02-16 DIAGNOSIS — T81718A Complication of other artery following a procedure, not elsewhere classified, initial encounter: Secondary | ICD-10-CM

## 2019-02-16 DIAGNOSIS — D259 Leiomyoma of uterus, unspecified: Secondary | ICD-10-CM | POA: Diagnosis not present

## 2019-02-16 DIAGNOSIS — R700 Elevated erythrocyte sedimentation rate: Secondary | ICD-10-CM

## 2019-02-16 DIAGNOSIS — I739 Peripheral vascular disease, unspecified: Secondary | ICD-10-CM

## 2019-02-16 DIAGNOSIS — I998 Other disorder of circulatory system: Secondary | ICD-10-CM

## 2019-02-16 DIAGNOSIS — M1711 Unilateral primary osteoarthritis, right knee: Secondary | ICD-10-CM

## 2019-02-16 DIAGNOSIS — Z9889 Other specified postprocedural states: Secondary | ICD-10-CM

## 2019-02-16 DIAGNOSIS — I10 Essential (primary) hypertension: Secondary | ICD-10-CM

## 2019-02-16 NOTE — Patient Instructions (Signed)

## 2019-02-19 ENCOUNTER — Encounter: Payer: Self-pay | Admitting: Physician Assistant

## 2019-02-19 ENCOUNTER — Encounter: Payer: Self-pay | Admitting: Rheumatology

## 2019-02-19 ENCOUNTER — Ambulatory Visit: Payer: BC Managed Care – PPO | Admitting: Physician Assistant

## 2019-02-19 ENCOUNTER — Encounter: Payer: Self-pay | Admitting: Obstetrics & Gynecology

## 2019-02-19 DIAGNOSIS — R768 Other specified abnormal immunological findings in serum: Secondary | ICD-10-CM | POA: Diagnosis not present

## 2019-02-20 ENCOUNTER — Encounter: Payer: Self-pay | Admitting: Family Medicine

## 2019-02-21 LAB — CYTOLOGY - PAP
Adequacy: ABSENT
Comment: NEGATIVE
Diagnosis: NEGATIVE
Diagnosis: REACTIVE
High risk HPV: NEGATIVE

## 2019-02-22 ENCOUNTER — Telehealth: Payer: Self-pay | Admitting: Neurology

## 2019-02-22 NOTE — Telephone Encounter (Signed)
Patient is calling in that she had the MRI and results were normal; patient is wanting to know if the testing with Dr. Melvyn Novas is truly necessary if the MRI came back Negative no signs of dementia. Thanks!

## 2019-02-22 NOTE — Telephone Encounter (Signed)
Pls let her know that the MRI brain does not diagnose dementia, it is done mostly to rule out other causes of memory loss such as tumor/stroke/bleed. The Neurocognitive testing is a battery of memory tests that can further evaluate her memory and also provide insight into other causes of memory loss such as stress/depression. If she feels comfortable with current situation, we can hold off on Neurocognitive testing and see her back in 6 months for re-evaluation. Thanks!

## 2019-02-22 NOTE — Telephone Encounter (Signed)
Wait no I just spoke with patient and she said that she was going to keep appt because her family wanted her to and the next appt isnt until March for her if she R/S.

## 2019-02-22 NOTE — Telephone Encounter (Signed)
Patient is going to hold off of Neurocognitive testing.FYI, she did restart cholesterol medication

## 2019-02-22 NOTE — Telephone Encounter (Signed)
Pt is going to do testing FYI

## 2019-02-22 NOTE — Telephone Encounter (Signed)
Please advise on 02/23/2019

## 2019-02-23 ENCOUNTER — Telehealth: Payer: Self-pay

## 2019-02-23 NOTE — Telephone Encounter (Signed)
Called pt regarding [place] results. Pt made aware that her uterus is slightly smaller than her last [place].Pt made aware that she still needs an endo bx because the lining of her uterus could not be evaluated due to fibroids.   Pt states that she doesn't want to have the endo bx  because she remembers the pain from the last endo bx. States that she will not be cleared by the cardiologist because she had a stint put in this year. She states that she will schedule the hysterectomy and biopsy once she is cleared by her Cardiologist to have the procedures done. Understanding was voiced.  chiquita l wilson, CMA

## 2019-02-23 NOTE — Telephone Encounter (Signed)
-----   Message from Lavonia Drafts, MD sent at 02/23/2019  9:08 AM EDT ----- Please call pt. Her uterus is actually slightly smaller than her last [place]. She still needs a endo bx as the lining of the uterus could not be evaluated due to fibroids. Please document your conversation in the chart.   If she allows you to schedule the biopsy, get her in ASAP!!!  Thx,  clh-S

## 2019-02-26 ENCOUNTER — Other Ambulatory Visit: Payer: Self-pay | Admitting: Hematology

## 2019-02-26 DIAGNOSIS — D472 Monoclonal gammopathy: Secondary | ICD-10-CM | POA: Insufficient documentation

## 2019-02-26 NOTE — Progress Notes (Signed)
[facility] NOTE  Patient Care Team: Libby Maw, MD as PCP - General (Family Medicine) Nahser, Wonda Cheng, MD as PCP - Cardiology (Cardiology) Lorretta Harp, MD as Consulting Physician (Cardiology)  HEME/ONC OVERVIEW: 1. IgA kappa MGUS  -02/2019: incidental monoclonal IgA kappa (0.4g/dL) by rheumatology  ASSESSMENT & PLAN:   IgA kappa MGUS -I reviewed the patient's records in detail, including rheumatology clinic notes, lab studies and imaging results -I also independently reviewed the radiologic images of recent cervical spine X-ray -In summary, patient has been followed by rheumatology for generalized myalgia, possibly suggesting polymyalgia rheumatica.  SPEP was checked as part of her work-up, which showed mildly elevated IgA and a small monoclonal IgA kappa protein.  Cervical spine x-ray showed degenerative disc disease.  CT abdomen/pelvis in 05/2018 did not show any lytic bone lesions.  Patient was referred to hematology for further evaluation. -I reviewed the lab and imaging results in detail with the patient -I also discussed the pathophysiology of plamsa cell dyscrasia at length with the visit, including the criteria for multiple myeloma and MGUS -As her recent work-up was incomplete, I have ordered SPEP, IFE, free light chains, UPEP and UFE  -In addition, I have ordered PET to rule out bony lesions -If the above work-up does not show any abnormal bony lesions, then she does not meet CRAB criteria, and bone marrow biopsy (in the absence of any myeloma-defining features) is unlikely to change the diagnosis.  -Therefore, unless she develops progressive anemia, hypercalcemia or renal dysfunction, we can hold off bone marrow biopsy   Elevated ESR -I informed the patient that MGUS is not known to cause elevated ESR, especially myalgia -PET scan ordered as above -As her recent rheumatologic work-up was notable for elevated ANA and anti-RNP ab, I  recommend her to follow up with rheumatology and PCP for further evaluation   Orders Placed This Encounter  Procedures  . NM PET Image Initial (PI) Whole Body    Standing Status:   Future    Standing Expiration Date:   03/01/2020    Order Specific Question:   If indicated for the ordered procedure, I authorize the administration of a radiopharmaceutical per Radiology protocol    Answer:   Yes    Order Specific Question:   Preferred imaging location?    Answer:   [facility]    Order Specific Question:   Radiology Contrast Protocol - do NOT remove file path    Answer:   [email or web address]    Order Specific Question:   Is the patient pregnant?    Answer:   No  . CBC with Differential (Cancer Center Only)    Standing Status:   Future    Standing Expiration Date:   04/05/2020  . CMP ([place] only)    Standing Status:   Future    Standing Expiration Date:   04/05/2020    All questions were answered. The patient knows to call the clinic with any problems, questions or concerns.  Return in 1 month for labs, imaging results and clinic appt.   Tish Men, MD 03/02/2019 9:47 AM   CHIEF COMPLAINTS/PURPOSE OF CONSULTATION:  "I am really nervous"  HISTORY OF PRESENTING ILLNESS:  Christina [name] 53 y.o. female is here because of IgA kappa MGUS.  Patient reports that she first developed bilateral upper arm muscle pain starting a month ago.  She denies any injury or strenuous activity at that time.  She presented to her PCP  for further evaluation, who ordered ESR that was very elevated.  She was referred to rheumatology for further evaluation.   SPEP was checked as part of her work-up, which showed mildly elevated IgA and a small monoclonal IgA kappa protein.  Cervical spine x-ray showed degenerative disc disease.  CT abdomen/pelvis in 05/2018 did not show any lytic bone lesions.  Patient was referred to hematology for further evaluation.  She was not informed  about why she was referred to hematology, and therefore has been very anxious and concerned about possible cancer.  She has been trying to lose weight intentionally by improving her diet, and has lost about 17 pounds since June 2020.  She denies any other constitutional symptoms, lymphadenopathy, chest pain, dyspnea, abdominal pain, nausea, vomiting, diarrhea, hematochezia, or melena.  REVIEW OF SYSTEMS:   Constitutional: ( - ) fevers, ( - )  chills , ( - ) night sweats Eyes: ( - ) blurriness of vision, ( - ) double vision, ( - ) watery eyes Ears, nose, mouth, throat, and face: ( - ) mucositis, ( - ) sore throat Respiratory: ( - ) cough, ( - ) dyspnea, ( - ) wheezes Cardiovascular: ( - ) palpitation, ( - ) chest discomfort, ( - ) lower extremity swelling Gastrointestinal:  ( - ) nausea, ( - ) heartburn, ( - ) change in bowel habits Skin: ( - ) abnormal skin rashes Lymphatics: ( - ) new lymphadenopathy, ( - ) easy bruising Neurological: ( - ) numbness, ( - ) tingling, ( - ) new weaknesses Behavioral/Psych: ( - ) mood change, ( - ) new changes  All other systems were reviewed with the patient and are negative.  I have reviewed her chart and materials related to her cancer extensively and collaborated history with the patient. Summary of oncologic history is as follows: Oncology History   No history exists.    MEDICAL HISTORY:  Past Medical History:  Diagnosis Date  . Acute blood loss as cause of postoperative anemia   . Anemia 01/22/2019  . Chest pain    a. prior coronary CT without CAD.  [place] Elevated sed rate 01/22/2019  . Essential hypertension 07/08/2016  . GERD (gastroesophageal reflux disease)   . PAD (peripheral artery disease) ([place])    a. 05/2018: PV angio subacute thrombotic occlusion of her popliteal artery. She underwent successful penumbra aspiration thrombectomy, PTA and self-expanding stenting using overlapping [place] self-expanding stents of a long thrombotic occlusion of the  popliteal, anterior tibial and tibioperoneal trunk. Procedure c/b pseudoaneurysm/ABL anemia.  . 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[place])   . Uterine fibroid   . Vitamin D deficiency     SURGICAL HISTORY: Past Surgical History:  Procedure Laterality Date  . ABDOMINAL AORTOGRAM W/LOWER EXTREMITY Right 05/08/2018   Procedure: ABDOMINAL AORTOGRAM W/LOWER EXTREMITY;  Surgeon: Lorretta Harp, MD;  Location: [facility];  Service: Cardiovascular;  Laterality: Right;  . lump removed from breast at age 89    . PERIPHERAL VASCULAR INTERVENTION Right 05/08/2018   Procedure: PERIPHERAL VASCULAR INTERVENTION;  Surgeon: Lorretta Harp, MD;  Location: [facility];  Service: Cardiovascular;  Laterality: Right;  Anterior tibial and popliteal stents  . PERIPHERAL VASCULAR THROMBECTOMY Right 05/08/2018   Procedure: PERIPHERAL VASCULAR THROMBECTOMY;  Surgeon: Lorretta Harp, MD;  Location: [facility];  Service: Cardiovascular;  Laterality: Right;  Popliteal, tibioperoneal trunk, Anterior tibial, Posterior tibial    SOCIAL HISTORY: Social History   Socioeconomic History  . Marital status:  Single    Spouse name: Not on file  . Number of children: Not on file  . Years of education: Not on file  . Highest education level: Not on file  Occupational History  . Not on file  Social Needs  . Financial resource strain: Not on file  . Food insecurity    Worry: Not on file    Inability: Not on file  . Transportation needs    Medical: Not on file    Non-medical: Not on file  Tobacco Use  . Smoking status: Never Smoker  . Smokeless tobacco: Never Used  Substance and Sexual Activity  . Alcohol use: No  . Drug use: No  . Sexual activity: Yes  Lifestyle  . Physical activity    Days per week: Not on file    Minutes per session: Not on file  . Stress: Not on file  Relationships  . Social Herbalist on phone: Not on file    Gets together: Not on file    Attends  religious service: Not on file    Active member of club or organization: Not on file    Attends meetings of clubs or organizations: Not on file    Relationship status: Not on file  . Intimate partner violence    Fear of current or ex partner: Not on file    Emotionally abused: Not on file    Physically abused: Not on file    Forced sexual activity: Not on file  Other Topics Concern  . Not on file  Social History Narrative  . Not on file    FAMILY HISTORY: Family History  Problem Relation Age of Onset  . Hypertension Mother   . Alzheimer's disease Father   . Healthy Son   . Colon polyps Neg Hx   . Crohn's disease Neg Hx   . Rectal cancer Neg Hx   . Stomach cancer Neg Hx   . Pancreatic cancer Neg Hx     ALLERGIES:  is allergic to lisinopril.  MEDICATIONS:  Current Outpatient Medications  Medication Sig Dispense Refill  . apixaban (ELIQUIS) 5 MG TABS tablet Take 1 tablet (5 mg total) by mouth 2 (two) times daily. 60 tablet 6  . atorvastatin (LIPITOR) 80 MG tablet Take 1 tablet (80 mg total) by mouth daily. 90 tablet 2  . carvedilol (COREG) 6.25 MG tablet Take 1 tablet (6.25 mg total) by mouth 2 (two) times daily. 60 tablet 4  . clopidogrel (PLAVIX) 75 MG tablet Take 1 tablet (75 mg total) by mouth daily. 90 tablet 3  . hydrochlorothiazide (MICROZIDE) 12.5 MG capsule Take 1 capsule (12.5 mg total) by mouth daily. 90 capsule 3  . megestrol (MEGACE) 40 MG tablet Take 1 tablet (40 mg total) by mouth 2 (two) times daily. 180 tablet 3  . methocarbamol (ROBAXIN) 500 MG tablet Take 1 tablet (500 mg total) by mouth every 8 (eight) hours as needed for muscle spasms. 30 tablet 0  . potassium chloride (K-DUR) 10 MEQ tablet Take 1 tablet (10 mEq total) by mouth 3 (three) times a week. 90 tablet 0  . valsartan (DIOVAN) 160 MG tablet Take 1 tablet (160 mg total) by mouth daily. TO REPLACE LOSARTAN 90 tablet 3  . vitamin B-12 (CYANOCOBALAMIN) 1000 MCG tablet Take 1,000 mcg by mouth daily.     . Vitamin D, Ergocalciferol, (DRISDOL) 1.25 MG (50000 UT) CAPS capsule Take 1 capsule (50,000 Units total) by mouth every 7 (seven) days.  25 capsule 1   No current facility-administered medications for this visit.     PHYSICAL EXAMINATION: ECOG PERFORMANCE STATUS: 1 - Symptomatic but completely ambulatory  Vitals:   03/02/19 0855  BP: 116/84  Pulse: 84  Resp: 16  Temp: (!) 97 F (36.1 C)  SpO2: 100%   Filed Weights   03/02/19 0855  Weight: 264 lb 6.4 oz (119.9 kg)    GENERAL: alert, no distress and comfortable, obese  SKIN: skin color, texture, turgor are normal, no rashes or significant lesions EYES: conjunctiva are pink and non-injected, sclera clear OROPHARYNX: no exudate, no erythema; lips, buccal mucosa, and tongue normal  NECK: supple, non-tender LUNGS: clear to auscultation with normal breathing effort HEART: regular rate & rhythm, no murmurs, no lower extremity edema ABDOMEN: soft, non-tender, non-distended, normal bowel sounds Musculoskeletal: no cyanosis of digits and no clubbing  PSYCH: alert & oriented x 3, fluent speech NEURO: no focal motor/sensory deficits  LABORATORY DATA:  I have reviewed the data as listed Lab Results  Component Value Date   WBC 6.8 03/02/2019   HGB 13.2 03/02/2019   HCT 40.4 03/02/2019   MCV 93.1 03/02/2019   PLT 257 03/02/2019   Lab Results  Component Value Date   NA 136 03/02/2019   K 4.1 03/02/2019   CL 105 03/02/2019   CO2 22 03/02/2019    RADIOGRAPHIC STUDIES: I have personally reviewed the radiological images as listed and agreed with the findings in the report. Mr Brain 89 Contrast  Result Date: 02/04/2019 CLINICAL DATA:  53 year old female with memory loss. EXAM: MRI HEAD WITHOUT CONTRAST TECHNIQUE: Multiplanar, multiecho pulse sequences of the brain and surrounding structures were obtained without intravenous contrast. COMPARISON:  None. FINDINGS: Brain: Cerebral volume appears within normal limits for age. No  restricted diffusion or evidence of acute infarction. 10 millimeter cystic change to the pineal gland (series 5, image 16) with no regional mass effect (normal variant). No midline shift, mass effect, evidence of mass lesion, ventriculomegaly, extra-axial collection or acute intracranial hemorrhage. Cervicomedullary junction and pituitary are within normal limits. Mesial temporal lobe structures appear within normal limits. Pearline Cables and white matter signal is within normal limits for age throughout the brain. No cortical encephalomalacia or chronic cerebral blood products. Vascular: Major intracranial vascular flow voids are preserved. The right vertebral artery is dominant and mildly tortuous. Skull and upper cervical spine: Normal visible cervical spine. Visualized bone marrow signal is within normal limits. Sinuses/Orbits: Negative orbits.  Paranasal sinuses are clear. Other: Mastoids are clear. Visible internal auditory structures appear normal. Scalp and face soft tissues appear negative. IMPRESSION: Noncontrast MRI appearance of the brain is normal for age. Electronically Signed   By: Genevie Ann M.D.   On: 02/04/2019 08:12   US Pelvis Transvaginal Non-ob (tv Only)  Result Date: 02/16/2019 CLINICAL DATA:  Abnormal uterine bleeding EXAM: TRANSABDOMINAL AND TRANSVAGINAL ULTRASOUND OF PELVIS TECHNIQUE: Both transabdominal and transvaginal ultrasound examinations of the pelvis were performed. Transabdominal technique was performed for global imaging of the pelvis including uterus, ovaries, adnexal regions, and pelvic cul-de-sac. It was necessary to proceed with endovaginal exam following the transabdominal exam to visualize the uterus, endometrium and ovaries. COMPARISON:  10/19/2012 FINDINGS: Uterus Measurements: 14.5 x 9.3 x 11.3 cm = volume: 803 mL. Multiple fibroids are identified. Dominant fibroid arises from the left side of uterus measures 8.8 x 8.0 x 7.5 cm Anterior fundal fibroid measures 2.8 x 2.7 x 2.5 cm.  Endometrium Thickness: Endometrial stripe is not confidently identified. Fluid  distension of the fundal endometrial canal is identified. No focal abnormality visualized. Right ovary Measurements: Not visualized.  No adnexal mass. Left ovary Measurements: Not visualized.  No adnexal mass noted. Other findings No abnormal free fluid. IMPRESSION: 1. Enlarged fibroid uterus with a volume of approximately 803 cc. The dominant fibroid arises from the left side of uterus with a maximum dimension of 8.8 cm and may have mass effect upon the endometrial cavity evident by fluid within the fundal portion of the canal. 2. The endometrial stripe is not confidently identified and is largely obscured secondary to multiple fibroids. If bleeding remains unresponsive to hormonal or medical therapy, sonohysterogram should be considered for focal lesion work-up. (Ref: Radiological Reasoning: Algorithmic Workup of Abnormal Vaginal Bleeding with Endovaginal Sonography and Sonohysterography. AJR 2008; 859:M93-11) 3. Nonvisualization of the ovaries. Electronically Signed   By: Kerby Moors M.D.   On: 02/16/2019 16:15   US Pelvis (transabdominal Only)  Result Date: 02/16/2019 CLINICAL DATA:  Abnormal uterine bleeding EXAM: TRANSABDOMINAL AND TRANSVAGINAL ULTRASOUND OF PELVIS TECHNIQUE: Both transabdominal and transvaginal ultrasound examinations of the pelvis were performed. Transabdominal technique was performed for global imaging of the pelvis including uterus, ovaries, adnexal regions, and pelvic cul-de-sac. It was necessary to proceed with endovaginal exam following the transabdominal exam to visualize the uterus, endometrium and ovaries. COMPARISON:  10/19/2012 FINDINGS: Uterus Measurements: 14.5 x 9.3 x 11.3 cm = volume: 803 mL. Multiple fibroids are identified. Dominant fibroid arises from the left side of uterus measures 8.8 x 8.0 x 7.5 cm Anterior fundal fibroid measures 2.8 x 2.7 x 2.5 cm. Endometrium Thickness: Endometrial  stripe is not confidently identified. Fluid distension of the fundal endometrial canal is identified. No focal abnormality visualized. Right ovary Measurements: Not visualized.  No adnexal mass. Left ovary Measurements: Not visualized.  No adnexal mass noted. Other findings No abnormal free fluid. IMPRESSION: 1. Enlarged fibroid uterus with a volume of approximately 803 cc. The dominant fibroid arises from the left side of uterus with a maximum dimension of 8.8 cm and may have mass effect upon the endometrial cavity evident by fluid within the fundal portion of the canal. 2. The endometrial stripe is not confidently identified and is largely obscured secondary to multiple fibroids. If bleeding remains unresponsive to hormonal or medical therapy, sonohysterogram should be considered for focal lesion work-up. (Ref: Radiological Reasoning: Algorithmic Workup of Abnormal Vaginal Bleeding with Endovaginal Sonography and Sonohysterography. AJR 2008; 216:K44-69) 3. Nonvisualization of the ovaries. Electronically Signed   By: Kerby Moors M.D.   On: 02/16/2019 16:15    PATHOLOGY: I have reviewed the pathology reports as documented in the oncologist history.

## 2019-02-27 ENCOUNTER — Encounter: Payer: BC Managed Care – PPO | Admitting: Psychology

## 2019-02-27 ENCOUNTER — Ambulatory Visit (INDEPENDENT_AMBULATORY_CARE_PROVIDER_SITE_OTHER): Payer: BC Managed Care – PPO | Admitting: Psychology

## 2019-02-27 ENCOUNTER — Encounter: Payer: Self-pay | Admitting: Psychology

## 2019-02-27 ENCOUNTER — Other Ambulatory Visit: Payer: Self-pay

## 2019-02-27 ENCOUNTER — Ambulatory Visit: Payer: BC Managed Care – PPO | Admitting: Psychology

## 2019-02-27 DIAGNOSIS — R4189 Other symptoms and signs involving cognitive functions and awareness: Secondary | ICD-10-CM | POA: Diagnosis not present

## 2019-02-27 DIAGNOSIS — R413 Other amnesia: Secondary | ICD-10-CM

## 2019-02-27 NOTE — Progress Notes (Signed)
NEUROPSYCHOLOGICAL EVALUATION [place]. [place] Department of Neurology  Reason for Referral:   Christina [name] is a 53 y.o. African-American female referred by Ellouise Newer, M.D., to characterize her current cognitive functioning and assist with diagnostic clarity and treatment planning in the context of subjective cognitive decline and a family history of Alzheimer's disease.  Assessment and Plan:   Clinical Impression(s): Christina [name]' pattern of performance is suggestive of neuropsychological functioning generally within normal limits. Weakness were exhibited across receptive and expressive language and across a single unstructured task assessing hypothesis testing and problem solving; however, scores remained in the below average normative range. Encoding (i.e., learning) aspects of memory were variable, with decreased performance across unstructured tasks. Performance was within normal limits across domains of processing speed, attention/concentration, other aspects of executive functioning, visuospatial abilities, and retrieval/consolidation aspects of memory.   Notably, trouble encoding information can be seen in various autoimmune conditions and Christina [name] reported a history of an elevated sed test and concerns surrounding this form of illness. However, subsequent test results are still pending and cognitive weaknesses could just as well be related to other-non neurological sources. These can include significant psychiatric distress (e.g., anxiety and depression) and/or the presence of psychosocial stressors, as well as ongoing sleep dysfunction. It is likely that a combination of these factors negatively influence day-to-day cognitive difficulties which Christina [name] has been experiencing presently.  Specific to memory, while Christina [name] exhibited trouble learning unstructured verbal and visual information at times, she was able to retain learned knowledge after lengthy  delays. Overall, memory performance combined with intact performances across other areas of cognitive functioning is not suggestive of an underlying neurodegenerative condition affecting memory processes (e.g., Alzheimer's disease) at this time. Likewise, her cognitive and behavioral profile is not suggestive of any other form of neurodegenerative illness presently.  Recommendations: A future re-evaluation would be beneficial should Christina [name] experience increased subjective cognitive decline or note trouble performing daily activities. This would aid future efforts towards improved diagnostic clarity. There is no specific timeframe for when this evaluation should take place and she should be referred based upon increasing cognitive or functional complaints.   Christina [name] alluded to significant ongoing distress in the form of unspecified family stressors. She expressed a desire to work with an individual therapist to address these stressors. As such, Christina [name] is encouraged to engage in short-term psychotherapy. She would benefit from an active and collaborative therapeutic environment, rather than one purely supportive in nature. Recommended treatment modalities include Cognitive Behavioral Therapy (CBT) or Acceptance and Commitment Therapy (ACT).  Christina [name] reported waking up around 1am each night and experiencing notable difficulties falling back asleep. This pattern of broken sleep may prevent her from spending the necessary amount of time in deeper, more restorative stages of sleep. As such, she is encouraged to speak with her medical team surrounding medication interventions to improve her overall sleep quality. She may also wish to discuss the pros and cons of completing a formal laboratory sleep study.  When learning new information, she will benefit from information being broken up into small, manageable pieces. She may also find it helpful to articulate the material in her own words and in a context  to promote encoding at the onset of a new task. This material may need to be repeated multiple times to promote encoding.  Because she shows better recall for structured information, she will likely understand and retain new information better if it is presented to  her in a meaningful or well-organized manner at the outset, such as grouping items into meaningful categories or presenting information in an outlined, bulleted, or story format.   To address problems with fluctuating attention, she may wish to consider:   -Avoiding external distractions when needing to concentrate   -Writing down complicated information and using checklists   -Attempting and completing one task at a time (i.e., no multi-tasking)   -Verbalizing aloud each step of a task to maintain focus   -Reducing the amount of information considered at one time  Review of Records:   Christina [name] was seen by [facility] KitchenEllouise Newer, M.D.) on 01/04/2019 for an evaluation of worsening memory. At that time, she reported that over the past 1-2 months, she had trouble remembering details of conversations, especially while at work. She also reported that her father had Alzheimer's disease, which represents a notable concern for her. ADLs were described as intact. Mood was unremarkable; however, she did allude to some significant familial stressors. Performance on a brief cognitive screening instrument over the phone (blind MOCA) was within normal limits (20/22). Ultimately, Christina [name] was referred for a comprehensive neuropsychological evaluation to characterize her cognitive abilities and to assist with diagnostic clarity and treatment planning.  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  Past Medical History:  Diagnosis Date   Acute blood loss as cause of postoperative anemia    Anemia 01/22/2019   Chest pain    a. prior coronary CT without  CAD.   Elevated sed rate 01/22/2019   Essential hypertension 07/08/2016   GERD (gastroesophageal reflux disease)    PAD (peripheral artery disease) ([place])    a. 05/2018: PV angio subacute thrombotic occlusion of her popliteal artery. She underwent successful penumbra aspiration thrombectomy, PTA and self-expanding stenting using overlapping [place] self-expanding stents of a long thrombotic occlusion of the popliteal, anterior tibial and tibioperoneal trunk. Procedure c/b pseudoaneurysm/ABL anemia.   Pseudoaneurysm following procedure [facility])    Uterine fibroid    Vitamin D deficiency     Past Surgical History:  Procedure Laterality Date   ABDOMINAL AORTOGRAM W/LOWER EXTREMITY Right 05/08/2018   Procedure: ABDOMINAL AORTOGRAM W/LOWER EXTREMITY;  Surgeon: Lorretta Harp, MD;  Location: [facility];  Service: Cardiovascular;  Laterality: Right;   lump removed from breast at age 39     PERIPHERAL VASCULAR INTERVENTION Right 05/08/2018   Procedure: PERIPHERAL VASCULAR INTERVENTION;  Surgeon: Lorretta Harp, MD;  Location: [facility];  Service: Cardiovascular;  Laterality: Right;  Anterior tibial and popliteal stents   PERIPHERAL VASCULAR THROMBECTOMY Right 05/08/2018   Procedure: PERIPHERAL VASCULAR THROMBECTOMY;  Surgeon: Lorretta Harp, MD;  Location: [facility];  Service: Cardiovascular;  Laterality: Right;  Popliteal, tibioperoneal trunk, Anterior tibial, Posterior tibial    Family History  Problem Relation Age of Onset   Hypertension Mother    Alzheimer's disease Father    Healthy Son    Colon polyps Neg Hx    Crohn's disease Neg Hx    Rectal cancer Neg Hx    Stomach cancer Neg Hx    Pancreatic cancer Neg Hx      Current Outpatient Medications:    apixaban (ELIQUIS) 5 MG TABS tablet, Take 1 tablet (5 mg total) by mouth 2 (two) times daily., Disp: 60 tablet, Rfl: 6   atorvastatin (LIPITOR) 80 MG tablet, Take 1 tablet (80 mg total)  by mouth  daily., Disp: 90 tablet, Rfl: 2   carvedilol (COREG) 6.25 MG tablet, Take 1 tablet (6.25 mg total) by mouth 2 (two) times daily., Disp: 60 tablet, Rfl: 4   clopidogrel (PLAVIX) 75 MG tablet, Take 1 tablet (75 mg total) by mouth daily., Disp: 90 tablet, Rfl: 3   hydrochlorothiazide (MICROZIDE) 12.5 MG capsule, Take 1 capsule (12.5 mg total) by mouth daily., Disp: 90 capsule, Rfl: 3   megestrol (MEGACE) 40 MG tablet, Take 1 tablet (40 mg total) by mouth 2 (two) times daily., Disp: 180 tablet, Rfl: 3   methocarbamol (ROBAXIN) 500 MG tablet, Take 1 tablet (500 mg total) by mouth every 8 (eight) hours as needed for muscle spasms., Disp: 30 tablet, Rfl: 0   potassium chloride (K-DUR) 10 MEQ tablet, Take 1 tablet (10 mEq total) by mouth 3 (three) times a week., Disp: 90 tablet, Rfl: 0   valsartan (DIOVAN) 160 MG tablet, Take 1 tablet (160 mg total) by mouth daily. TO REPLACE LOSARTAN, Disp: 90 tablet, Rfl: 3   vitamin B-12 (CYANOCOBALAMIN) 1000 MCG tablet, Take 1,000 mcg by mouth daily., Disp: , Rfl:    Vitamin D, Ergocalciferol, (DRISDOL) 1.25 MG (50000 UT) CAPS capsule, Take 1 capsule (50,000 Units total) by mouth every 7 (seven) days., Disp: 25 capsule, Rfl: 1  Clinical Interview:   Cognitive Symptoms: Decreased short-term memory: Endorsed. While at work, Ms. Hagood noted quickly forgetting information stated to her over the phone and must write down this information immediately or risk forgetting it completely. These difficulties were said to be noticeable for the past 2 months. However, since discontinuing a cholesterol medication, she noted a recent improvement in these abilities, suggesting that short-term memory issues may have been a medication side effect. Decreased long-term memory: Denied. Decreased attention/concentration: Denied. Increased ease of distractibility: Denied. Reduced processing speed: Denied. Difficulties with executive functions: Denied. Difficulties with emotion  regulation: Denied. Difficulties with receptive language: Denied. Difficulties with word finding: Denied. Decreased visuoperceptual ability: Denied.  Difficulties completing ADLs: Denied.  Additional Medical History: History of traumatic brain injury/concussion: Denied. History of stroke: Denied. History of seizure activity: Denied. History of known exposure to toxins: Denied. Symptoms of chronic pain: Endorsed. She reported fluctuating (i.e., "comes and goes") pain, generally described as muscle aches, in her extremities. Medical records suggest a recent history of an elevated sed test, suggesting increased inflammation. Given these results and her fluctuating pain levels, Ms. Clarkston reported being tested for various autoimmune conditions. 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 Experience of frequent headaches/migraines: Endorsed. She reported daily headaches; however, these generally do not rise to where OTC medications are necessary. Symptoms were largely attributed to job-related stress. Frequent instances of dizziness/vertigo: Denied.  Sensory changes: She reported ongoing symptoms of tinnitus and ear popping sensations. Other sensory changes/difficulties (e.g., vision, taste, or smell) were denied. Balance/coordination difficulties: Denied. Other motor difficulties: Denied.  Sleep History: Estimated hours obtained each night: 6-7 hours. Difficulties falling asleep: Denied. Difficulties staying asleep: Endorsed. She reported waking up to use the restroom around 1am each night. This is typically followed by extended periods of time where she has trouble falling back asleep, generally lasting 2-3 hours. Feels rested and refreshed upon awakening: Endorsed.  History of snoring: She acknowledged that a past significant other noted snoring behaviors; however, Ms. Mehan did not  believe him. Current snoring behaviors are unclear.  History of waking up gasping for  air: Denied. Witnessed breath cessation while asleep: Denied.  History of vivid dreaming: Denied. Excessive movement while asleep: Denied. Instances of acting out her dreams: Denied.  Psychiatric/Behavioral Health History: Depression: Denied. Acutely, Ms. Solimine acknowledged significant stress stemming from several familial stressors. However, outside of these, she described her mood as "otherwise good." She denied previous diagnoses surrounding mental health concerns. Current or remote suicidal ideation, intent, or plan was also denied.  Anxiety: Denied. Mania: Denied. Visual/auditory hallucinations: Denied. Delusional thoughts: Denied. Mental health treatment: Denied. However, she did express a desire to engage in short-term individual counseling to discuss ongoing family stressors.  Tobacco: Denied. Alcohol: Ms. Bigby denied current alcohol consumption, as well as a history of problematic alcohol use, abuse, or dependence.  Recreational drugs: Denied. Caffeine: Denied.  Academic/Vocational History: Highest level of educational attainment: 14 years. Ms. Colglazier described herself as a good (A/B) student in academic settings. Mathematics, specifically algebra, was described as a relative weakness. History of developmental delay: Denied. History of grade repetition: Denied. History of class failures: Denied. Enrollment in special education courses: Denied. History of diagnosed specific learning disability: Denied. History of ADHD: Denied.  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  Evaluation Results:   Behavioral Observations: Ms. Sisto was unaccompanied, arrived to her appointment on time, and was appropriately dressed and groomed. Observed gait and station were within normal limits. Gross motor functioning appeared intact upon  informal observation and no abnormal movements (e.g., tremors) were noted. Her affect was generally relaxed and positive, but did range appropriately given the subject being discussed during the clinical interview or the task at hand during testing procedures. She appeared anxious when discussing her father's history of Alzheimer's disease and potential genetic links which could increase her risk of developing that disease. Spontaneous speech was fluent and word finding difficulties were not observed during the clinical interview or testing procedures. Sustained attention was appropriate throughout. Thought processes were coherent, organized, and normal in content. Task engagement was adequate and she persisted well when challenged. Overall, Ms. Forys was cooperative with the clinical interview and subsequent testing procedures.   Adequacy of Effort: The validity of neuropsychological testing is limited by the extent to which the individual being tested may be assumed to have exerted adequate effort during testing. Ms. Delise expressed her intention to perform to the best of her abilities and exhibited adequate task engagement and persistence. Scores across stand-alone and embedded performance validity measures were within expectation. As such, the results of the current evaluation are believed to be a valid representation of Ms. Huguley' current cognitive functioning.  Test Results: Ms. Miga was fully oriented at the time of the current evaluation.  Intellectual abilities based upon educational and vocational attainment were estimated to be in the average range. Premorbid abilities were estimated to be within the average range based upon a single-word reading test.   Processing speed was average. Basic attention was average. More complex attention (e.g., working memory) was above average. Performance across tasks assessing executive functions (e.g., cognitive flexibility, response inhibition, nonverbal  abstract reasoning, analytical problem solving) were largely average to above average. A relative weakness was exhibited across an unstructured task assessing hypothesis testing and problem solving.  Assessed receptive language abilities were below average and Ms. Hoefling had some trouble understanding complex sentence structure. Ms. Pulkrabek did not exhibit any difficulties comprehending task instructions and answered all questions asked of her appropriately. Assessed expressive language (e.g., verbal fluency  and confrontation naming) was below average.     Assessed visuospatial/visuoconstructional abilities were average to exceptionally high.    Learning (i.e., encoding) of novel verbal and visual information was variable, ranging from the well below average to average normative ranges. Spontaneous delayed recall (i.e., retrieval) of previously learned information was commensurate with performance across initial learning trials. Retention rates were appropriate across memory measures. Performance across recognition tasks was largely appropriate, suggesting evidence for information consolidation. However, a weakness was exhibited across a list learning recognition task.    Results of emotional screening instruments suggested that recent symptoms of generalized anxiety were in the none to slight range, while symptoms of depression were within normal limits. A screening instrument assessing recent sleep quality suggested the presence of minimal sleep dysfunction.  Tables of Scores:   Note: This summary of test scores accompanies the interpretive report and should not be considered in isolation without reference to the appropriate sections in the text. Descriptors are based on appropriate normative data and may be adjusted based on clinical judgment. The terms impaired and within normal limits (WNL) are used when a more specific level of functioning cannot be determined.       Effort Testing:   DESCRIPTOR        ACS Word Choice: --- --- Within Expectation  CVLT-III Forced Choice Recognition: --- --- Within Expectation  BVMT-R Retention Percentage: --- --- Within Expectation       Orientation:      Raw Score Percentile   NAB Orientation, Form 1 29/29 --- ---       Intellectual Functioning:           Standard Score Percentile   Test of Premorbid Functioning: 100 50 Average       Memory:          Wechsler Memory Scale (WMS-IV):                       Raw Score (Scaled Score) Percentile     Logical Memory I 23/50 (9) 37 Average    Logical Memory II 20/50 (10) 50 Average    Logical Memory Recognition 25/30 51-75 Average       [place] Verbal Learning Test (CVLT-III), Standard Form: Raw Score (Scaled/Standard Score) Percentile     Total Trials 1-5 36/80 (82) 12 Below Average    List B 2/16 (4) 2 Well Below Average    Short-Delay Free Recall 7/16 (7) 16 Below Average    Short-Delay Cued Recall 7/16 (6) 9 Below Average    Long Delay Free Recall 6/16 (6) 9 Below Average    Long Delay Cued Recall 8/16 (7) 16 Below Average      Recognition Hits 15/16 (10) 50 Average      False Positive Errors 9 (6) 9 Below Average       Brief Visuospatial Memory Test (BVMT-R), Form 1: Raw Score (T Score) Percentile     Total Trials 1-3 16/36 (36) 8 Well Below Average    Delayed Recall 8/12 (46) 34 Average    Recognition Discrimination Index 5 11-16 Below Average      Recognition Hits 5/6 11-16 Below Average      False Positive Errors 0 >16 Within Normal Limits        Attention/Executive Function:          Trail Making Test (TMT): Raw Score (T Score) Percentile     Part A 31 secs.,  1 error (53) 62 Average  Part B 57 secs.,  0 errors (62) 88 Above Average        Scaled Score Percentile   WAIS-IV Coding: 8 25 Average       NAB Attention Module, Form 1: T Score Percentile     Digits Forward 55 69 Average    Digits Backwards 57 75 Above Average       D-KEFS Color-Word Interference Test:  Raw Score (Scaled Score) Percentile     Color Naming 27 secs. (11) 63 Average    Word Reading 27 secs. (8) 25 Average    Inhibition 61 secs. (10) 50 Average      Total Errors 0 errors (12) 75 Above Average    Inhibition/Switching 57 secs. (12) 75 Above Average      Total Errors 0 errors (12) 75 Above Average       D-KEFS Verbal Fluency Test: Raw Score (Scaled Score) Percentile     Letter Total Correct 24 (6) 9 Below Average    Category Total Correct 31 (7) 16 Below Average    Category Switching Total Correct 13 (10) 50 Average    Category Switching Accuracy 12 (10) 50 Average      Total Set Loss Errors 1 (11) 63 Average      Total Repetition Errors 4 (9) 37 Average       D-KEFS 20 Questions Test: Scaled Score Percentile     Total Weighted Achievement Score 10 50 Average    Initial Abstraction Score 11 63 Average       [place] Card Sorting Test [facility]): Raw Score Percentile     Categories (trials) 3 (64) >16 Within Normal Limits    Total Errors 23 16 Below Average    Perseverative Errors 10 31 Average    Non-Perseverative Errors 13 7 Well Below Average    Failure to Maintain Set 1 --- ---       Language:          NAB Language Module, Form 1: T Score Percentile     Auditory Comprehension 40 16 Below Average    Naming 29/31 (42) 21 Below Average       Visuospatial/Visuoconstruction:          NAB Spatial Module, Form 1: T Score Percentile     Figure Drawing Copy 70 98 Exceptionally High    Figure Drawing Immediate Recall 50 50 Average        Scaled Score Percentile   WAIS-IV Matrix Reasoning: 11 63 Average  WAIS-IV Visual Puzzles: 9 37 Average       Mood and Personality:      Raw Score Percentile   Beck Depression Inventory - II: 3 --- Within Normal Limits  PROMIS Anxiety Questionnaire: 7 --- None to Slight       Additional Questionnaires:      Raw Score Percentile   PROMIS Sleep Disturbance Questionnaire: 21 --- None to Slight   Informed Consent and  Coding/Compliance:   Ms. Butts was provided with a verbal description of the nature and purpose of the present neuropsychological evaluation. Also reviewed were the foreseeable risks and/or discomforts and benefits of the procedure, limits of confidentiality, and mandatory reporting requirements of this provider. The patient was given the opportunity to ask questions and receive answers about the evaluation. Oral consent to participate was provided by the patient.   This evaluation was conducted by Christia Reading, Ph.D., licensed clinical neuropsychologist. Ms. Haft completed a 30-minute clinical interview, billed as one unit [phone],  and 140 minutes of cognitive testing, billed as one unit [phone] and four additional units 96139. Psychometrist Milana Kidney, B.S., assisted Dr. Melvyn Novas with test administration and scoring procedures. As a separate and discrete service, Dr. Melvyn Novas spent a total of 180 minutes in interpretation and report writing, billed as one unit 96132 and two units 96133.

## 2019-02-27 NOTE — Progress Notes (Signed)
   Neuropsychology Note   Christina [name] completed 125 minutes of neuropsychological testing with technician, Milana Kidney, B.S., under the supervision of Dr. Christia Reading, Ph.D., licensed neuropsychologist. The patient did not appear overtly distressed by the testing session, per behavioral observation or via self-report to the technician. Rest breaks were offered.    In considering the patient's current level of functioning, level of presumed impairment, nature of symptoms, emotional and behavioral responses during the interview, level of literacy, and observed level of motivation/effort, a battery of tests was selected and communicated to the psychometrician.   Communication between the psychologist and technician was ongoing throughout the testing session and changes were made as deemed necessary based on patient performance on testing, technician observations and additional pertinent factors such as those listed above.   Christina [name] will return within approximately two weeks for an interactive feedback session with Dr. Melvyn Novas at which time his test performances, clinical impressions, and treatment recommendations will be reviewed in detail. The patient understands she can contact our office should she require our assistance before this time.   Full report to follow.  125 minutes were spent face-to-face with patient administering standardized tests and 15 minutes were spent scoring (technician). [CPT [id number], [id number]

## 2019-02-28 ENCOUNTER — Ambulatory Visit (INDEPENDENT_AMBULATORY_CARE_PROVIDER_SITE_OTHER): Payer: BC Managed Care – PPO | Admitting: Rheumatology

## 2019-02-28 ENCOUNTER — Encounter: Payer: Self-pay | Admitting: Physician Assistant

## 2019-02-28 ENCOUNTER — Encounter: Payer: Self-pay | Admitting: Rheumatology

## 2019-02-28 VITALS — BP 135/84 | HR 93 | Resp 15 | Ht 65.0 in | Wt 265.6 lb

## 2019-02-28 DIAGNOSIS — I70229 Atherosclerosis of native arteries of extremities with rest pain, unspecified extremity: Secondary | ICD-10-CM

## 2019-02-28 DIAGNOSIS — R700 Elevated erythrocyte sedimentation rate: Secondary | ICD-10-CM | POA: Diagnosis not present

## 2019-02-28 DIAGNOSIS — Z959 Presence of cardiac and vascular implant and graft, unspecified: Secondary | ICD-10-CM

## 2019-02-28 DIAGNOSIS — I10 Essential (primary) hypertension: Secondary | ICD-10-CM

## 2019-02-28 DIAGNOSIS — I998 Other disorder of circulatory system: Secondary | ICD-10-CM

## 2019-02-28 DIAGNOSIS — R768 Other specified abnormal immunological findings in serum: Secondary | ICD-10-CM | POA: Diagnosis not present

## 2019-02-28 DIAGNOSIS — M1711 Unilateral primary osteoarthritis, right knee: Secondary | ICD-10-CM | POA: Diagnosis not present

## 2019-02-28 DIAGNOSIS — R778 Other specified abnormalities of plasma proteins: Secondary | ICD-10-CM | POA: Diagnosis not present

## 2019-02-28 DIAGNOSIS — Z9889 Other specified postprocedural states: Secondary | ICD-10-CM

## 2019-02-28 DIAGNOSIS — E559 Vitamin D deficiency, unspecified: Secondary | ICD-10-CM

## 2019-02-28 DIAGNOSIS — R7303 Prediabetes: Secondary | ICD-10-CM

## 2019-02-28 DIAGNOSIS — T81718A Complication of other artery following a procedure, not elsewhere classified, initial encounter: Secondary | ICD-10-CM

## 2019-02-28 DIAGNOSIS — I739 Peripheral vascular disease, unspecified: Secondary | ICD-10-CM

## 2019-02-28 DIAGNOSIS — M5030 Other cervical disc degeneration, unspecified cervical region: Secondary | ICD-10-CM

## 2019-02-28 DIAGNOSIS — I729 Aneurysm of unspecified site: Secondary | ICD-10-CM

## 2019-02-28 NOTE — Progress Notes (Signed)
Office Visit Note  Patient: Christina [name]             Date of Birth: Oct 29, 1965           MRN: [id number]             PCP: Libby Maw, MD Referring: Libby Maw,* Visit Date: 02/28/2019 Occupation: '@GUAROCC'$ @  Subjective:  Elevated sed rate  History of Present Illness: Christina [name] is a 53 y.o. female with history of elevated sedimentation rate and positive ANA.  She states she initially had some myalgia which is resolved now.  She does not have any arm pain now.  She states she has been very anxious about her lab results and has not been sleeping well because of that.  She denies any joint pain or joint swelling.  There is no history of oral ulcers, nasal ulcers, malar rash, photosensitivity.  She is awaiting appointment with hematology for abnormal SPEP.  Activities of Daily Living:  Patient reports morning stiffness for 0 minute.   Patient Denies nocturnal pain.  Difficulty dressing/grooming: Denies Difficulty climbing stairs: Denies Difficulty getting out of chair: Denies Difficulty using hands for taps, buttons, cutlery, and/or writing: Denies  Review of Systems  Constitutional: Negative for fatigue, night sweats, weight gain and weight loss.  HENT: Negative for mouth sores, trouble swallowing, trouble swallowing, mouth dryness and nose dryness.   Eyes: Negative for pain, redness, visual disturbance and dryness.  Respiratory: Negative for cough, shortness of breath and difficulty breathing.   Cardiovascular: Negative for chest pain, palpitations, hypertension, irregular heartbeat and swelling in legs/feet.  Gastrointestinal: Negative for blood in stool, constipation and diarrhea.  Endocrine: Negative for increased urination.  Genitourinary: Negative for vaginal dryness.  Musculoskeletal: Positive for myalgias and myalgias. Negative for arthralgias, joint pain, joint swelling, muscle weakness, morning stiffness and muscle tenderness.  Skin: Negative for  color change, rash, hair loss, skin tightness, ulcers and sensitivity to sunlight.  Allergic/Immunologic: Negative for susceptible to infections.  Neurological: Negative for dizziness, memory loss, night sweats and weakness.       Tinnitus  Hematological: Negative for swollen glands.  Psychiatric/Behavioral: Negative for depressed mood and sleep disturbance. The patient is not nervous/anxious.     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 prior coronary CT without CAD.   Elevated sed rate 01/22/2019   Essential hypertension 07/08/2016   GERD (gastroesophageal reflux disease)    PAD (peripheral artery disease) ([place])    a. 05/2018: PV angio subacute thrombotic occlusion of her popliteal artery. She underwent successful penumbra aspiration thrombectomy, PTA and self-expanding stenting using overlapping [place] self-expanding stents of a long thrombotic occlusion of the popliteal, anterior tibial and tibioperoneal trunk. Procedure c/b  pseudoaneurysm/ABL anemia.   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[place])    Uterine fibroid    Vitamin D deficiency     Family History  Problem Relation Age of Onset   Hypertension Mother    Alzheimer's disease Father     Healthy Son    Colon polyps Neg Hx    Crohn's disease Neg Hx    Rectal cancer Neg Hx    Stomach cancer Neg Hx    Pancreatic cancer Neg Hx    Past Surgical History:  Procedure Laterality Date   ABDOMINAL AORTOGRAM W/LOWER EXTREMITY Right 05/08/2018   Procedure: ABDOMINAL AORTOGRAM W/LOWER EXTREMITY;  Surgeon: Lorretta Harp, MD;  Location: [facility];  Service: Cardiovascular;  Laterality: Right;   lump removed from breast at age 22     PERIPHERAL VASCULAR INTERVENTION Right 05/08/2018   Procedure: PERIPHERAL VASCULAR INTERVENTION;  Surgeon: Lorretta Harp, MD;  Location: [facility];  Service: Cardiovascular;  Laterality: Right;  Anterior tibial and popliteal stents   PERIPHERAL VASCULAR THROMBECTOMY Right 05/08/2018   Procedure: PERIPHERAL VASCULAR THROMBECTOMY;  Surgeon: Lorretta Harp, MD;  Location: [facility];  Service: Cardiovascular;  Laterality: Right;  Popliteal, tibioperoneal trunk, Anterior tibial, Posterior tibial   Social History   Social History Narrative   Not on file   Immunization History  Administered Date(s) Administered   Influenza,inj,Quad PF,6+ Mos 01/31/2017, 04/03/2018, 12/27/2018   Influenza-Unspecified 06/23/2013   Tdap 03/30/2017     Objective: Vital Signs: BP 135/84 (BP Location: Left Arm, Patient Position: Sitting, Cuff Size: Normal)    Pulse 93    Resp 15    Ht '5\' 5"'$  (1.651 m)    Wt 265 lb 9.6 oz (120.5 kg)    BMI 44.20 kg/m    Physical Exam Vitals signs and nursing note reviewed.  Constitutional:      Appearance: She is well-developed.  HENT:     Head: Normocephalic and atraumatic.  Eyes:     Conjunctiva/sclera: Conjunctivae normal.  Neck:     Musculoskeletal: Normal range of motion.  Cardiovascular:     Rate and Rhythm: Normal rate and regular rhythm.     Heart sounds: Normal heart sounds.  Pulmonary:     Effort: Pulmonary effort is normal.     Breath sounds: Normal breath sounds.  Abdominal:       General: Bowel sounds are normal.     Palpations: Abdomen is soft.  Lymphadenopathy:     Cervical: No cervical adenopathy.  Skin:    General: Skin is warm and dry.     Capillary Refill: Capillary refill takes less than 2 seconds.  Neurological:     Mental Status: She is alert and oriented to person, place, and time.  Psychiatric:        Behavior: Behavior normal.      Musculoskeletal Exam: C-spine thoracic and lumbar spine with good range of motion.  Shoulder joints, elbow joints, wrist joints, MCPs PIPs and DIPs with good range of motion with no synovitis.  Hip joints knee joints ankles MTPs PIPs with good range of motion and no synovitis.  She has no muscular weakness or tenderness.  She had no difficulty getting up from the chair.  CDAI Exam: CDAI Score: -- Patient Global: --; Provider Global: -- Swollen: --; Tender: -- Joint Exam   No joint exam has been documented for this visit   There is currently no information documented on the homunculus. Go to the Rheumatology activity and complete  the homunculus joint exam.  Investigation: No additional findings.  Imaging: Mr Brain Wo Contrast  Result Date: 02/04/2019 CLINICAL DATA:  53 year old female with memory loss. EXAM: MRI HEAD WITHOUT CONTRAST TECHNIQUE: Multiplanar, multiecho pulse sequences of the brain and surrounding structures were obtained without intravenous contrast. COMPARISON:  None. FINDINGS: Brain: Cerebral volume appears within normal limits for age. No restricted diffusion or evidence of acute infarction. 10 millimeter cystic change to the pineal gland (series 5, image 16) with no regional mass effect (normal variant). No midline shift, mass effect, evidence of mass lesion, ventriculomegaly, extra-axial collection or acute intracranial hemorrhage. Cervicomedullary junction and pituitary are within normal limits. Mesial temporal lobe structures appear within normal limits. [place] Cables and white matter signal is within  normal limits for age throughout the brain. No cortical encephalomalacia or chronic cerebral blood products. Vascular: Major intracranial vascular flow voids are preserved. The right vertebral artery is dominant and mildly tortuous. Skull and upper cervical spine: Normal visible cervical spine. Visualized bone marrow signal is within normal limits. Sinuses/Orbits: Negative orbits.  Paranasal sinuses are clear. Other: Mastoids are clear. Visible internal auditory structures appear normal. Scalp and face soft tissues appear negative. IMPRESSION: Noncontrast MRI appearance of the brain is normal for age. Electronically Signed   By: Genevie Ann M.D.   On: 02/04/2019 08:12   US Pelvis Transvaginal Non-ob (tv Only)  Result Date: 02/16/2019 CLINICAL DATA:  Abnormal uterine bleeding EXAM: TRANSABDOMINAL AND TRANSVAGINAL ULTRASOUND OF PELVIS TECHNIQUE: Both transabdominal and transvaginal ultrasound examinations of the pelvis were performed. Transabdominal technique was performed for global imaging of the pelvis including uterus, ovaries, adnexal regions, and pelvic cul-de-sac. It was necessary to proceed with endovaginal exam following the transabdominal exam to visualize the uterus, endometrium and ovaries. COMPARISON:  10/19/2012 FINDINGS: Uterus Measurements: 14.5 x 9.3 x 11.3 cm = volume: 803 mL. Multiple fibroids are identified. Dominant fibroid arises from the left side of uterus measures 8.8 x 8.0 x 7.5 cm Anterior fundal fibroid measures 2.8 x 2.7 x 2.5 cm. Endometrium Thickness: Endometrial stripe is not confidently identified. Fluid distension of the fundal endometrial canal is identified. No focal abnormality visualized. Right ovary Measurements: Not visualized.  No adnexal mass. Left ovary Measurements: Not visualized.  No adnexal mass noted. Other findings No abnormal free fluid. IMPRESSION: 1. Enlarged fibroid uterus with a volume of approximately 803 cc. The dominant fibroid arises from the left side of  uterus with a maximum dimension of 8.8 cm and may have mass effect upon the endometrial cavity evident by fluid within the fundal portion of the canal. 2. The endometrial stripe is not confidently identified and is largely obscured secondary to multiple fibroids. If bleeding remains unresponsive to hormonal or medical therapy, sonohysterogram should be considered for focal lesion work-up. (Ref: Radiological Reasoning: Algorithmic Workup of Abnormal Vaginal Bleeding with Endovaginal Sonography and Sonohysterography. AJR 2008; 833:A25-05) 3. Nonvisualization of the ovaries. Electronically Signed   By: Kerby Moors M.D.   On: 02/16/2019 16:15   US Pelvis (transabdominal Only)  Result Date: 02/16/2019 CLINICAL DATA:  Abnormal uterine bleeding EXAM: TRANSABDOMINAL AND TRANSVAGINAL ULTRASOUND OF PELVIS TECHNIQUE: Both transabdominal and transvaginal ultrasound examinations of the pelvis were performed. Transabdominal technique was performed for global imaging of the pelvis including uterus, ovaries, adnexal regions, and pelvic cul-de-sac. It was necessary to proceed with endovaginal exam following the transabdominal exam to visualize the uterus, endometrium and ovaries. COMPARISON:  10/19/2012 FINDINGS: Uterus Measurements: 14.5 x 9.3 x 11.3 cm = volume: 803 mL. Multiple  fibroids are identified. Dominant fibroid arises from the left side of uterus measures 8.8 x 8.0 x 7.5 cm Anterior fundal fibroid measures 2.8 x 2.7 x 2.5 cm. Endometrium Thickness: Endometrial stripe is not confidently identified. Fluid distension of the fundal endometrial canal is identified. No focal abnormality visualized. Right ovary Measurements: Not visualized.  No adnexal mass. Left ovary Measurements: Not visualized.  No adnexal mass noted. Other findings No abnormal free fluid. IMPRESSION: 1. Enlarged fibroid uterus with a volume of approximately 803 cc. The dominant fibroid arises from the left side of uterus with a maximum dimension of  8.8 cm and may have mass effect upon the endometrial cavity evident by fluid within the fundal portion of the canal. 2. The endometrial stripe is not confidently identified and is largely obscured secondary to multiple fibroids. If bleeding remains unresponsive to hormonal or medical therapy, sonohysterogram should be considered for focal lesion work-up. (Ref: Radiological Reasoning: Algorithmic Workup of Abnormal Vaginal Bleeding with Endovaginal Sonography and Sonohysterography. AJR 2008; 267:T24-58) 3. Nonvisualization of the ovaries. Electronically Signed   By: Kerby Moors M.D.   On: 02/16/2019 16:15    Recent Labs: Lab Results  Component Value Date   WBC 6.2 01/22/2019   HGB 13.7 01/22/2019   PLT 248.0 01/22/2019   NA 140 01/22/2019   K 4.0 01/22/2019   CL 108 01/22/2019   CO2 22 01/22/2019   GLUCOSE 94 01/22/2019   BUN 8 01/22/2019   CREATININE 0.86 01/22/2019   BILITOT 0.8 10/20/2018   ALKPHOS 120 (H) 09/05/2018   AST 13 10/20/2018   ALT 14 10/20/2018   PROT 7.9 02/05/2019   ALBUMIN 4.1 09/05/2018   CALCIUM 10.0 01/22/2019   GFRAA 82 07/14/2018   February 19, 2019 avise index -1.1, ANA 1: 80 speckled, ENA negative, Jo 1 -, CB CAP negative, RF IgM 4.2 equivocal, anti-CCP negative, anticardiolipin negative, beta-2 GP 1 -, anti-TPO negative, antithyroglobulin negative Speciality Comments: No specialty comments available.  Procedures:  No procedures performed Allergies: Lisinopril   Assessment / Plan:     Visit Diagnoses: Positive ANA (antinuclear antibody) - AVISE negative, ANA 1: 80 speckled low titer, ENA negative, CB CAP negative.  Patient has no clinical features of autoimmune disease.  I detailed discussion regarding her labs  with her.  Her ANA titer is low which is not significant.  Elevated sed rate-I am uncertain about the etiology of her elevated sedimentation rate.  She has no features of polymyalgia rheumatica.  She has appointment coming up with oncologist.   Patient is having a lot of anxiety about the elevated sedimentation rate.  Detailed counseling was provided.  Abnormal SPEP-she has appointment with heme-onc.  Primary osteoarthritis of right knee-she is currently not having much discomfort.  DDD (degenerative disc disease), cervical-she had good range of motion of her cervical spine.  Critical limb ischemia with history of revascularization of same extremity  Peripheral arterial disease ([place])  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[place])  Vitamin D deficiency  Essential hypertension  Pre-diabetes  Orders: No orders of the defined types were placed in this encounter.  No orders of the defined types were placed in this encounter.   Face-to-face time spent with patient was 25 minutes. Greater than 50% of time was spent in counseling and coordination of care.  Follow-Up Instructions: Return in about 3 months (around 05/31/2019) for elevated ESR.  Patient wants to come back for follow-up visit in 3 months.   Bo Merino, MD  Note - This record has  has been created using [employer].  Chart creation errors have been sought, but may not always  have been located. Such creation errors do not reflect on  the standard of medical care.

## 2019-03-02 ENCOUNTER — Encounter: Payer: Self-pay | Admitting: Family Medicine

## 2019-03-02 ENCOUNTER — Inpatient Hospital Stay: Payer: BC Managed Care – PPO | Attending: Hematology

## 2019-03-02 ENCOUNTER — Other Ambulatory Visit: Payer: Self-pay

## 2019-03-02 ENCOUNTER — Encounter

## 2019-03-02 ENCOUNTER — Encounter: Payer: Self-pay | Admitting: Hematology

## 2019-03-02 ENCOUNTER — Inpatient Hospital Stay (HOSPITAL_BASED_OUTPATIENT_CLINIC_OR_DEPARTMENT_OTHER): Payer: BC Managed Care – PPO | Admitting: Hematology

## 2019-03-02 VITALS — BP 116/84 | HR 84 | Temp 97.0°F | Resp 16 | Ht 65.0 in | Wt 264.4 lb

## 2019-03-02 DIAGNOSIS — D472 Monoclonal gammopathy: Secondary | ICD-10-CM | POA: Diagnosis not present

## 2019-03-02 DIAGNOSIS — E559 Vitamin D deficiency, unspecified: Secondary | ICD-10-CM | POA: Diagnosis not present

## 2019-03-02 DIAGNOSIS — Z79899 Other long term (current) drug therapy: Secondary | ICD-10-CM | POA: Diagnosis not present

## 2019-03-02 DIAGNOSIS — R700 Elevated erythrocyte sedimentation rate: Secondary | ICD-10-CM | POA: Diagnosis not present

## 2019-03-02 DIAGNOSIS — Z7901 Long term (current) use of anticoagulants: Secondary | ICD-10-CM | POA: Diagnosis not present

## 2019-03-02 DIAGNOSIS — D259 Leiomyoma of uterus, unspecified: Secondary | ICD-10-CM | POA: Diagnosis not present

## 2019-03-02 DIAGNOSIS — I739 Peripheral vascular disease, unspecified: Secondary | ICD-10-CM | POA: Diagnosis not present

## 2019-03-02 DIAGNOSIS — I10 Essential (primary) hypertension: Secondary | ICD-10-CM | POA: Diagnosis not present

## 2019-03-02 DIAGNOSIS — K219 Gastro-esophageal reflux disease without esophagitis: Secondary | ICD-10-CM | POA: Diagnosis not present

## 2019-03-02 LAB — CBC WITH DIFFERENTIAL (CANCER CENTER ONLY)
Abs Immature Granulocytes: 0.07 10*3/uL (ref 0.00–0.07)
Basophils Absolute: 0 10*3/uL (ref 0.0–0.1)
Basophils Relative: 1 %
Eosinophils Absolute: 0.1 10*3/uL (ref 0.0–0.5)
Eosinophils Relative: 1 %
HCT: 40.4 % (ref 36.0–46.0)
Hemoglobin: 13.2 g/dL (ref 12.0–15.0)
Immature Granulocytes: 1 %
Lymphocytes Relative: 23 %
Lymphs Abs: 1.6 10*3/uL (ref 0.7–4.0)
MCH: 30.4 pg (ref 26.0–34.0)
MCHC: 32.7 g/dL (ref 30.0–36.0)
MCV: 93.1 fL (ref 80.0–100.0)
Monocytes Absolute: 0.8 10*3/uL (ref 0.1–1.0)
Monocytes Relative: 11 %
Neutro Abs: 4.3 10*3/uL (ref 1.7–7.7)
Neutrophils Relative %: 63 %
Platelet Count: 257 10*3/uL (ref 150–400)
RBC: 4.34 MIL/uL (ref 3.87–5.11)
RDW: 13.2 % (ref 11.5–15.5)
WBC Count: 6.8 10*3/uL (ref 4.0–10.5)
nRBC: 0 % (ref 0.0–0.2)

## 2019-03-02 LAB — CMP (CANCER CENTER ONLY)
ALT: 15 U/L (ref 0–44)
AST: 14 U/L — ABNORMAL LOW (ref 15–41)
Albumin: 4.5 g/dL (ref 3.5–5.0)
Alkaline Phosphatase: 89 U/L (ref 38–126)
Anion gap: 9 (ref 5–15)
BUN: 15 mg/dL (ref 6–20)
CO2: 22 mmol/L (ref 22–32)
Calcium: 10 mg/dL (ref 8.9–10.3)
Chloride: 105 mmol/L (ref 98–111)
Creatinine: 0.97 mg/dL (ref 0.44–1.00)
GFR, Est AFR Am: >60 mL/min (ref >60)
GFR, Estimated: >60 mL/min (ref >60)
Glucose, Bld: 109 mg/dL — ABNORMAL HIGH (ref 70–99)
Potassium: 4.1 mmol/L (ref 3.5–5.1)
Sodium: 136 mmol/L (ref 135–145)
Total Bilirubin: 0.8 mg/dL (ref 0.3–1.2)
Total Protein: 7.8 g/dL (ref 6.5–8.1)

## 2019-03-02 LAB — SAVE SMEAR(SSMR), FOR PROVIDER SLIDE REVIEW

## 2019-03-02 LAB — LACTATE DEHYDROGENASE: LDH: 208 U/L — ABNORMAL HIGH (ref 98–192)

## 2019-03-02 NOTE — Patient Instructions (Signed)
Monoclonal Gammopathy of Undetermined Significance (MGUS) Monoclonal gammopathy of undetermined significance (MGUS) is a condition in which there is too much of a protein called monoclonal protein, or M protein, in the blood. MGUS can cause you to have too many cells in your blood and not enough space for healthy cells. This condition may increase your risk of developing multiple myeloma or other blood disorders in the future. What are the causes? The cause of this condition is not known. What increases the risk? You are more likely to develop this condition if:  You are African American.  You are age 50 or older.  You are female.  You have an autoimmune disease.  You have been exposed to radiation.  You have a family history of MGUS. What are the signs or symptoms? There are no symptoms of this condition. How is this diagnosed?  This condition may be diagnosed with a blood test that checks for M protein. How is this treated? Treatment for this condition may involve:  Having regular exams. This will allow your health care provider to monitor your health.  Having tests done regularly, such as: ? Blood tests to check for M protein in your body. ? Imaging tests, such as a CT scan. ? A bone marrow biopsy. This test involves taking a sample of bone marrow from your body so it can be looked at under a microscope. Follow these instructions at home:  Keep all follow-up visits as told by your health care provider. This is important. Contact a health care provider if:  You have trouble swallowing.  You have pain in your back or ribs.  You have a fever.  You are bruising easily. Get help right away if:  You break a bone.  You have trouble breathing. Summary  Monoclonal gammopathy of undetermined significance (MGUS) is a condition in which there is too much of a protein called monoclonal protein, or M protein, in the blood.  This condition may be diagnosed with a blood test  that checks for M protein.  Treatment for this condition may involve having tests done regularly. Tests may include blood tests, imaging tests, and a bone marrow biopsy. This information is not intended to replace advice given to you by your health care provider. Make sure you discuss any questions you have with your health care provider. Document Released: 03/10/2016 Document Revised: 06/12/2018 Document Reviewed: 03/10/2016 Elsevier Patient Education  2020 [employer]  

## 2019-03-02 NOTE — Telephone Encounter (Signed)
Tylenol should be fine

## 2019-03-03 ENCOUNTER — Encounter: Payer: Self-pay | Admitting: Rheumatology

## 2019-03-03 LAB — BETA 2 MICROGLOBULIN, SERUM: Beta-2 Microglobulin: 1.6 mg/L (ref 0.6–2.4)

## 2019-03-04 ENCOUNTER — Encounter: Payer: Self-pay | Admitting: Family Medicine

## 2019-03-05 LAB — MULTIPLE MYELOMA PANEL, SERUM
Albumin SerPl Elph-Mcnc: 3.7 g/dL (ref 2.9–4.4)
Albumin/Glob SerPl: 1 (ref 0.7–1.7)
Alpha 1: 0.2 g/dL (ref 0.0–0.4)
Alpha2 Glob SerPl Elph-Mcnc: 1.1 g/dL — ABNORMAL HIGH (ref 0.4–1.0)
B-Globulin SerPl Elph-Mcnc: 1 g/dL (ref 0.7–1.3)
Gamma Glob SerPl Elph-Mcnc: 1.4 g/dL (ref 0.4–1.8)
Globulin, Total: 3.8 g/dL (ref 2.2–3.9)
IgA: 387 mg/dL — ABNORMAL HIGH (ref 87–352)
IgG (Immunoglobin G), Serum: 1341 mg/dL (ref 586–1602)
IgM (Immunoglobulin M), Srm: 103 mg/dL (ref 26–217)
M Protein SerPl Elph-Mcnc: 0.4 g/dL — ABNORMAL HIGH
Total Protein ELP: 7.5 g/dL (ref 6.0–8.5)

## 2019-03-05 LAB — KAPPA/LAMBDA LIGHT CHAINS
Kappa free light chain: 46.9 mg/L — ABNORMAL HIGH (ref 3.3–19.4)
Kappa, lambda light chain ratio: 3.3 — ABNORMAL HIGH (ref 0.26–1.65)
Lambda free light chains: 14.2 mg/L (ref 5.7–26.3)

## 2019-03-05 NOTE — Telephone Encounter (Signed)
Dr. Estanislado Pandy called and spoke with patient and answered her questions.

## 2019-03-05 NOTE — Telephone Encounter (Signed)
I called patient and discussed that we have already obtained ENA panel and her CK was normal.  She was satisfied and had no further questions.

## 2019-03-06 ENCOUNTER — Encounter: Payer: BC Managed Care – PPO | Admitting: Psychology

## 2019-03-08 ENCOUNTER — Encounter: Payer: Self-pay | Admitting: Hematology

## 2019-03-09 ENCOUNTER — Other Ambulatory Visit: Payer: Self-pay

## 2019-03-09 ENCOUNTER — Encounter: Payer: Self-pay | Admitting: Hematology

## 2019-03-09 ENCOUNTER — Encounter: Payer: Self-pay | Admitting: Family Medicine

## 2019-03-09 ENCOUNTER — Ambulatory Visit (INDEPENDENT_AMBULATORY_CARE_PROVIDER_SITE_OTHER): Payer: BC Managed Care – PPO | Admitting: Family Medicine

## 2019-03-09 DIAGNOSIS — M62838 Other muscle spasm: Secondary | ICD-10-CM

## 2019-03-09 DIAGNOSIS — F439 Reaction to severe stress, unspecified: Secondary | ICD-10-CM

## 2019-03-09 NOTE — Progress Notes (Addendum)
Established Patient Office Visit  Subjective:  Patient ID: Christina [name], female    DOB: May 09, 1965  Age: 53 y.o. MRN: [id number]  CC:  Chief Complaint  Patient presents with  . Follow-up    HPI Christina [name] presents for follow-up essentially of her elevated sed rate.  Sed rate has increased some.  Patient does not feel well.  She continues to have random muscle aches and cramps located especially around her feet.  She is not sleeping well.  She wakes up at 1:00 in the morning and cannot go back to sleep and winds up shopping on [employer].  She has not started the Robaxin I gave her because she was waiting for specific diagnosis before starting it.  Recent blood work.  Obtain by oncology is in the chart and pending.  I asked her to follow-up with oncology for those results.  A PET scan has been ordered and is scheduled for the 20th.  Once again she is up-to-date on her health maintenance.  Mammogram this year.  Pap smear 2 weeks ago.  Cologuard last year. She denies night sweats and has only experienced intentional weight loss.   Past Medical History:  Diagnosis Date  . Acute blood loss as cause of postoperative anemia   . Anemia 01/22/2019  . Chest pain    a. prior coronary CT without CAD.  Marland Kitchen Elevated sed rate 01/22/2019  . Essential hypertension 07/08/2016  . GERD (gastroesophageal reflux disease)   . PAD (peripheral artery disease) ([place])    a. 05/2018: PV angio subacute thrombotic occlusion of her popliteal artery. She underwent successful penumbra aspiration thrombectomy, PTA and self-expanding stenting using overlapping Tigris self-expanding stents of a long thrombotic occlusion of the popliteal, anterior tibial and tibioperoneal trunk. Procedure c/b pseudoaneurysm/ABL anemia.  . 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[place])   . Uterine fibroid   . Vitamin D deficiency     Past Surgical History:  Procedure Laterality Date  . ABDOMINAL AORTOGRAM W/[name] EXTREMITY Right 05/08/2018   Procedure: ABDOMINAL AORTOGRAM W/[name] EXTREMITY;  Surgeon: Lorretta Harp, MD;  Location: [facility];  Service: Cardiovascular;  Laterality: Right;  . lump removed from breast at age 64    . PERIPHERAL VASCULAR INTERVENTION Right 05/08/2018   Procedure: PERIPHERAL VASCULAR INTERVENTION;  Surgeon: Lorretta Harp, MD;  Location: [facility];  Service: Cardiovascular;  Laterality: Right;  Anterior tibial and popliteal stents  . PERIPHERAL VASCULAR THROMBECTOMY Right 05/08/2018   Procedure: PERIPHERAL VASCULAR THROMBECTOMY;  Surgeon: Lorretta Harp, MD;  Location: [facility];  Service: Cardiovascular;  Laterality: Right;  Popliteal, tibioperoneal trunk, Anterior tibial, Posterior tibial    Family History  Problem Relation Age of Onset  . Hypertension Mother   . Alzheimer's disease Father   . Healthy Son   . Colon polyps Neg Hx   . Crohn's disease Neg Hx   . Rectal cancer Neg Hx   . Stomach cancer Neg Hx   . Pancreatic cancer Neg Hx     Social History   Socioeconomic History  . Marital status: Single    Spouse name: Not on file  . Number of children: Not on file  . Years of education: Not on file  . Highest education level: Not on file  Occupational History  . Not on file  Social Needs  . Financial resource strain: Not on file  . Food insecurity    Worry: Not on file    Inability: Not on file  .  Transportation needs    Medical: Not on file    Non-medical: Not on file  Tobacco Use  . Smoking status: Never Smoker  . Smokeless tobacco: Never Used  Substance and Sexual Activity  . Alcohol use: No  . Drug use: No  . Sexual activity: Yes  Lifestyle  . Physical activity    Days per week: Not on file    Minutes per session: Not on file  . Stress: Not on file  Relationships  . Social Herbalist on phone: Not on file    Gets together: Not on file    Attends religious service: Not on file    Active member of club or organization: Not on  file    Attends meetings of clubs or organizations: Not on file    Relationship status: Not on file  . Intimate partner violence    Fear of current or ex partner: Not on file    Emotionally abused: Not on file    Physically abused: Not on file    Forced sexual activity: Not on file  Other Topics Concern  . Not on file  Social History Narrative  . Not on file    Outpatient Medications Prior to Visit  Medication Sig Dispense Refill  . apixaban (ELIQUIS) 5 MG TABS tablet Take 1 tablet (5 mg total) by mouth 2 (two) times daily. 60 tablet 6  . atorvastatin (LIPITOR) 80 MG tablet Take 1 tablet (80 mg total) by mouth daily. 90 tablet 2  . carvedilol (COREG) 6.25 MG tablet Take 1 tablet (6.25 mg total) by mouth 2 (two) times daily. 60 tablet 4  . clopidogrel (PLAVIX) 75 MG tablet Take 1 tablet (75 mg total) by mouth daily. 90 tablet 3  . hydrochlorothiazide (MICROZIDE) 12.5 MG capsule Take 1 capsule (12.5 mg total) by mouth daily. 90 capsule 3  . megestrol (MEGACE) 40 MG tablet Take 1 tablet (40 mg total) by mouth 2 (two) times daily. 180 tablet 3  . potassium chloride (K-DUR) 10 MEQ tablet Take 1 tablet (10 mEq total) by mouth 3 (three) times a week. 90 tablet 0  . valsartan (DIOVAN) 160 MG tablet Take 1 tablet (160 mg total) by mouth daily. TO REPLACE LOSARTAN 90 tablet 3  . vitamin B-12 (CYANOCOBALAMIN) 1000 MCG tablet Take 1,000 mcg by mouth daily.    . Vitamin D, Ergocalciferol, (DRISDOL) 1.25 MG (50000 UT) CAPS capsule Take 1 capsule (50,000 Units total) by mouth every 7 (seven) days. 25 capsule 1  . methocarbamol (ROBAXIN) 500 MG tablet Take 1 tablet (500 mg total) by mouth every 8 (eight) hours as needed for muscle spasms. 30 tablet 0   No facility-administered medications prior to visit.     Allergies  Allergen Reactions  . Lisinopril Cough    ROS Review of Systems  Constitutional: Negative for diaphoresis, fatigue, fever and unexpected weight change.  Respiratory: Negative.    Cardiovascular: Negative.   Gastrointestinal: Negative.   Musculoskeletal: Positive for myalgias.  Neurological: Negative for seizures and speech difficulty.  Hematological: Negative.   Psychiatric/Behavioral: Positive for sleep disturbance. The patient is nervous/anxious.        Depression screen [facility] 2/9 03/09/2019 10/20/2018  Decreased Interest 0 0  Down, Depressed, Hopeless 0 0  PHQ - 2 Score 0 0  Altered sleeping 2 3  Tired, decreased energy 1 3  Change in appetite 0 0  Feeling bad or failure about yourself  0 0  Trouble concentrating 0 0  Moving slowly or fidgety/restless 0 0  Suicidal thoughts 0 0  PHQ-9 Score 3 6  Some recent data might be hidden    Objective:    Physical Exam  There were no vitals taken for this visit. Wt Readings from Last 3 Encounters:  03/02/19 264 lb 6.4 oz (119.9 kg)  02/28/19 265 lb 9.6 oz (120.5 kg)  02/16/19 269 lb (122 kg)   BP Readings from Last 3 Encounters:  03/02/19 116/84  02/28/19 135/84  02/16/19 129/80   Guideline developer:  UpToDate (see UpToDate for funding source) Date Released: June 2014  There are no preventive care reminders to display for this patient.  There are no preventive care reminders to display for this patient.  Lab Results  Component Value Date   TSH 1.070 02/05/2019   Lab Results  Component Value Date   WBC 6.8 03/02/2019   HGB 13.2 03/02/2019   HCT 40.4 03/02/2019   MCV 93.1 03/02/2019   PLT 257 03/02/2019   Lab Results  Component Value Date   NA 136 03/02/2019   K 4.1 03/02/2019   CO2 22 03/02/2019   GLUCOSE 109 (H) 03/02/2019   BUN 15 03/02/2019   CREATININE 0.97 03/02/2019   BILITOT 0.8 03/02/2019   ALKPHOS 89 03/02/2019   AST 14 (L) 03/02/2019   ALT 15 03/02/2019   PROT 7.8 03/02/2019   ALBUMIN 4.5 03/02/2019   CALCIUM 10.0 03/02/2019   ANIONGAP 9 03/02/2019   GFR 82.90 01/22/2019   Lab Results  Component Value Date   CHOL 119 09/05/2018   Lab Results  Component Value Date    HDL 43 09/05/2018   Lab Results  Component Value Date   LDLCALC 55 09/05/2018   Lab Results  Component Value Date   TRIG 106 09/05/2018   Lab Results  Component Value Date   CHOLHDL 2.8 09/05/2018   Lab Results  Component Value Date   HGBA1C 6.2 01/22/2019      Assessment & Plan:   Problem List Items Addressed This Visit      Other   Stress - Primary   Relevant Orders   Ambulatory referral to Psychology    Other Visit Diagnoses    Muscle spasms of both [name] extremities       Relevant Medications   methocarbamol (ROBAXIN) 500 MG tablet      Meds ordered this encounter  Medications  . methocarbamol (ROBAXIN) 500 MG tablet    Sig: Take 1 tablet (500 mg total) by mouth every 8 (eight) hours as needed for muscle spasms.    Dispense:  40 tablet    Refill:  0    Follow-up: Return in about 1 month (around 04/08/2019).   I have asked her to go for counseling.  I asked her to go ahead and try Robaxin for her muscle aches and pains.  I asked her to return to see me in a month after her PET scan.  May consider a second opinion with another rheumatologist.  PHQ-9 screens have been noncontributory.  Virtual Visit via Video Note  I connected with Christina [name] on 03/12/19 at  1:30 PM EST by a video enabled telemedicine application and verified that I am speaking with the correct person using two identifiers.  Location: Patient: home Provider:   I discussed the limitations of evaluation and management by telemedicine and the availability of in person appointments. The patient expressed understanding and agreed to proceed.  History of Present Illness:    Observations/Objective:  Assessment and Plan:   Follow Up Instructions:    I discussed the assessment and treatment plan with the patient. The patient was provided an opportunity to ask questions and all were answered. The patient agreed with the plan and demonstrated an understanding of the instructions.    The patient was advised to call back or seek an in-person evaluation if the symptoms worsen or if the condition fails to improve as anticipated.  I provided 30 minutes of non-face-to-face time during this encounter.   Libby Maw, MD

## 2019-03-10 ENCOUNTER — Encounter: Payer: Self-pay | Admitting: Rheumatology

## 2019-03-10 ENCOUNTER — Encounter: Payer: Self-pay | Admitting: Family Medicine

## 2019-03-12 ENCOUNTER — Encounter: Payer: Self-pay | Admitting: *Deleted

## 2019-03-12 ENCOUNTER — Other Ambulatory Visit: Payer: Self-pay | Admitting: Hematology

## 2019-03-12 DIAGNOSIS — D472 Monoclonal gammopathy: Secondary | ICD-10-CM

## 2019-03-12 MED ORDER — METHOCARBAMOL 500 MG PO TABS: 500.0000 mg | ORAL_TABLET | Freq: Three times a day (TID) | ORAL | 0 refills | Status: DC | PRN

## 2019-03-12 NOTE — Addendum Note (Signed)
Addended by: Jon Billings on: 03/12/2019 04:27 PM   Modules accepted: Orders

## 2019-03-13 ENCOUNTER — Other Ambulatory Visit: Payer: Self-pay | Admitting: *Deleted

## 2019-03-13 ENCOUNTER — Ambulatory Visit (HOSPITAL_COMMUNITY): Payer: BC Managed Care – PPO

## 2019-03-13 MED ORDER — CARVEDILOL 6.25 MG PO TABS: 6.2500 mg | ORAL_TABLET | Freq: Two times a day (BID) | ORAL | 3 refills | Status: DC

## 2019-03-14 DIAGNOSIS — Z780 Asymptomatic menopausal state: Secondary | ICD-10-CM | POA: Diagnosis not present

## 2019-03-15 ENCOUNTER — Ambulatory Visit: Payer: BC Managed Care – PPO | Admitting: Rheumatology

## 2019-03-15 ENCOUNTER — Other Ambulatory Visit: Payer: Self-pay | Admitting: *Deleted

## 2019-03-15 ENCOUNTER — Telehealth: Payer: Self-pay

## 2019-03-15 DIAGNOSIS — I739 Peripheral vascular disease, unspecified: Secondary | ICD-10-CM

## 2019-03-15 NOTE — Telephone Encounter (Signed)
LM2CB to set up appt with Dr Gwenlyn Found for leg pain post stents.

## 2019-03-16 ENCOUNTER — Telehealth: Payer: Self-pay | Admitting: *Deleted

## 2019-03-16 NOTE — Telephone Encounter (Signed)
Left message for patient to call and schedule [facility] and f/u with Dr. Gwenlyn Found per 03/15/19 staff message from Fredia Beets, RN

## 2019-03-18 DIAGNOSIS — M129 Arthropathy, unspecified: Secondary | ICD-10-CM | POA: Diagnosis not present

## 2019-03-18 DIAGNOSIS — I10 Essential (primary) hypertension: Secondary | ICD-10-CM | POA: Diagnosis not present

## 2019-03-18 DIAGNOSIS — D472 Monoclonal gammopathy: Secondary | ICD-10-CM | POA: Diagnosis not present

## 2019-03-18 DIAGNOSIS — M797 Fibromyalgia: Secondary | ICD-10-CM | POA: Diagnosis not present

## 2019-03-19 ENCOUNTER — Encounter: Payer: Self-pay | Admitting: Family Medicine

## 2019-03-19 NOTE — Addendum Note (Signed)
Addended by: Jon Billings on: 03/19/2019 08:01 AM   Modules accepted: Orders

## 2019-03-21 ENCOUNTER — Other Ambulatory Visit (HOSPITAL_COMMUNITY): Payer: Self-pay | Admitting: Cardiovascular Disease

## 2019-03-21 DIAGNOSIS — I739 Peripheral vascular disease, unspecified: Secondary | ICD-10-CM

## 2019-03-22 ENCOUNTER — Other Ambulatory Visit: Payer: Self-pay

## 2019-03-22 ENCOUNTER — Encounter: Payer: Self-pay | Admitting: Family Medicine

## 2019-03-22 ENCOUNTER — Ambulatory Visit (INDEPENDENT_AMBULATORY_CARE_PROVIDER_SITE_OTHER): Payer: BC Managed Care – PPO | Admitting: Family Medicine

## 2019-03-22 ENCOUNTER — Ambulatory Visit (HOSPITAL_COMMUNITY)
Admission: RE | Admit: 2019-03-22 | Discharge: 2019-03-22 | Disposition: A | Discharge status: Home / Self Care | Payer: BC Managed Care – PPO | Source: Ambulatory Visit | Attending: Family Medicine | Admitting: Family Medicine

## 2019-03-22 ENCOUNTER — Telehealth: Payer: Self-pay

## 2019-03-22 VITALS — BP 102/78 | Ht 65.0 in | Wt 265.0 lb

## 2019-03-22 DIAGNOSIS — D472 Monoclonal gammopathy: Secondary | ICD-10-CM | POA: Diagnosis not present

## 2019-03-22 DIAGNOSIS — M25512 Pain in left shoulder: Secondary | ICD-10-CM | POA: Insufficient documentation

## 2019-03-22 DIAGNOSIS — R29898 Other symptoms and signs involving the musculoskeletal system: Secondary | ICD-10-CM | POA: Diagnosis not present

## 2019-03-22 DIAGNOSIS — M25511 Pain in right shoulder: Secondary | ICD-10-CM | POA: Diagnosis not present

## 2019-03-22 DIAGNOSIS — M545 Low back pain, unspecified: Secondary | ICD-10-CM | POA: Insufficient documentation

## 2019-03-22 DIAGNOSIS — M19011 Primary osteoarthritis, right shoulder: Secondary | ICD-10-CM | POA: Diagnosis not present

## 2019-03-22 DIAGNOSIS — M19019 Primary osteoarthritis, unspecified shoulder: Secondary | ICD-10-CM | POA: Insufficient documentation

## 2019-03-22 DIAGNOSIS — R1310 Dysphagia, unspecified: Secondary | ICD-10-CM

## 2019-03-22 DIAGNOSIS — M19012 Primary osteoarthritis, left shoulder: Secondary | ICD-10-CM | POA: Diagnosis not present

## 2019-03-22 MED ORDER — SUCRALFATE 1 GM/10ML PO SUSP: 1.0000 g | Freq: Four times a day (QID) | ORAL | 1 refills | Status: DC | PRN

## 2019-03-22 NOTE — Patient Instructions (Signed)
Nice to meet you Please try the exercises  Please try ice  Please try voltaren over the counter  Please ask your employer about a standing desk   Please avoid sitting for long periods of time  I will call you with the results from today Please send me a message in [place] with any questions or updates.  Please see me back in 6 weeks.   --Dr. Raeford Razor

## 2019-03-22 NOTE — Assessment & Plan Note (Signed)
She reports more of a popping than any significant pain.  She does demonstrate degenerative changes on previous imaging.  Likely has component of arthritis that is associated.  Likely hip abduction weakness is contributing as well. -Counseled on home exercise therapy and supportive care. -Could get updated imaging. -Could consider injections.

## 2019-03-22 NOTE — Telephone Encounter (Signed)
Thanks Esther 

## 2019-03-22 NOTE — Telephone Encounter (Signed)
Called and offered the Barium Swallow Study, and patient agreed. Scheduled at [facility] on 04/04/19 patient to arrive at 9:15am and be NPO 3 hours before. Patient states she has not tried carafate before. Sent order to pharmacy and gave patient instructions for taking,  and to also ask the pharmacist which of her meds she can not take with the carafate

## 2019-03-22 NOTE — Assessment & Plan Note (Signed)
Acute in nature.  Has good range of motion.  May have a component of hypermobility as she demonstrates this in her hands and elbow but not in the lower extremity.  Posture is likely contributing as she sits at a computer for 8 hours a day.  Avoiding anti-inflammatories due to Eliquis. -X-ray. -Counseled on over-the-counter Voltaren. -Counseled on home exercise therapy and supportive care. -Referral to physical therapy. -Could consider injections

## 2019-03-22 NOTE — Assessment & Plan Note (Signed)
Pain is likely an exacerbation of an underlying degenerative change as well as muscle spasm.  Likely associated with long periods of sitting and having some deconditioning.  Avoiding anti-inflammatories due to Eliquis. -X-ray. -Referral to physical therapy. -Counseled on home exercise therapy and supportive care. -Could consider trigger point injections.

## 2019-03-22 NOTE — Progress Notes (Signed)
Christina [name] - 53 y.o. female MRN [id number]  Date of birth: 25-Jul-1965  SUBJECTIVE:  Including CC & ROS.  Chief Complaint  Patient presents with  . Shoulder Pain    bilateral shoulder  . Knee Pain    bilateral knee    Christina [name] is a 53 y.o. female that is presenting with myalgias, left and right shoulder pain, low back pain, and knee popping.  She has been seeing rheumatology over the past few months for abnormal labs.  An autoimmune disease is of low suspicion currently.  She reports having weakness and pain in the shoulders and upper back.  She feels that each shoulder joint is painful at night.  This is constant and mild to severe.  It is throbbing and sharp in nature.  She denies any specific inciting event.  She sits at a computer and works claims 8 hours a day.  She does little for exercise.  The pain seem to be worse at the end of the day and at night.  Has not taken any medication thus far for the pain.  No prior history of surgery.  Cannot describe a specific movement that is worse.  The low back pain seems to be worse when she gets up from a seated position.  This seems to be localized to the paraspinal muscles of the lower back.  Denies any radicular symptoms.  The pain is severe.  It is worse with prolonged standing after sitting.  It is sharp and stabbing.  No prior history of back surgery.  She also reports popping and changes of the knees.  She denies any significant pain.  She has had stents placed in her legs.  The sensations feel different compared to claudication that she experienced previously.  Denies any inciting event or trauma.  Independent review of the right knee x-ray from 1/16 shows moderate medial joint space narrowing and patellofemoral degenerative changes.  Independent review of the CT abdomen and pelvis from 1/9 shows mild degenerative change of the left hip joint and fairly well-maintained right hip joint.   Review of Systems  Constitutional: Negative for  fever.  HENT: Negative for congestion.   Respiratory: Negative for cough.   Cardiovascular: Negative for chest pain.  Gastrointestinal: Negative for abdominal pain.  Musculoskeletal: Positive for arthralgias, back pain and myalgias.  Skin: Negative for color change.  Neurological: Positive for weakness.  Hematological: Negative for adenopathy.    HISTORY: Past Medical, Surgical, Social, and Family History Reviewed & Updated per EMR.   Pertinent Historical Findings include:  Past Medical History:  Diagnosis Date  . Acute blood loss as cause of postoperative anemia   . Anemia 01/22/2019  . Chest pain    a. prior coronary CT without CAD.  Marland Kitchen Elevated sed rate 01/22/2019  . Essential hypertension 07/08/2016  . GERD (gastroesophageal reflux disease)   . PAD (peripheral artery disease) ([place])    a. 05/2018: PV angio subacute thrombotic occlusion of her popliteal artery. She underwent successful penumbra aspiration thrombectomy, PTA and self-expanding stenting using overlapping Tigris self-expanding stents of a long thrombotic occlusion of the popliteal, anterior tibial and tibioperoneal trunk. Procedure c/b pseudoaneurysm/ABL anemia.  . 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[place])   . Uterine fibroid   . Vitamin D deficiency     Past Surgical History:  Procedure Laterality Date  . ABDOMINAL AORTOGRAM W/LOWER EXTREMITY Right 05/08/2018   Procedure: ABDOMINAL AORTOGRAM W/LOWER EXTREMITY;  Surgeon: Lorretta Harp, MD;  Location: [facility];  Service:  Cardiovascular;  Laterality: Right;  . lump removed from breast at age 57    . PERIPHERAL VASCULAR INTERVENTION Right 05/08/2018   Procedure: PERIPHERAL VASCULAR INTERVENTION;  Surgeon: Lorretta Harp, MD;  Location: [facility];  Service: Cardiovascular;  Laterality: Right;  Anterior tibial and popliteal stents  . PERIPHERAL VASCULAR THROMBECTOMY Right 05/08/2018   Procedure: PERIPHERAL VASCULAR THROMBECTOMY;  Surgeon: Lorretta Harp, MD;  Location: [facility];  Service: Cardiovascular;  Laterality: Right;  Popliteal, tibioperoneal trunk, Anterior tibial, Posterior tibial    Allergies  Allergen Reactions  . Lisinopril Cough    Family History  Problem Relation Age of Onset  . Hypertension Mother   . Alzheimer's disease Father   . Healthy Son   . Colon polyps Neg Hx   . Crohn's disease Neg Hx   . Rectal cancer Neg Hx   . Stomach cancer Neg Hx   . Pancreatic cancer Neg Hx      Social History   Socioeconomic History  . Marital status: Single    Spouse name: Not on file  . Number of children: Not on file  . Years of education: Not on file  . Highest education level: Not on file  Occupational History  . Not on file  Social Needs  . Financial resource strain: Not on file  . Food insecurity    Worry: Not on file    Inability: Not on file  . Transportation needs    Medical: Not on file    Non-medical: Not on file  Tobacco Use  . Smoking status: Never Smoker  . Smokeless tobacco: Never Used  Substance and Sexual Activity  . Alcohol use: No  . Drug use: No  . Sexual activity: Yes  Lifestyle  . Physical activity    Days per week: Not on file    Minutes per session: Not on file  . Stress: Not on file  Relationships  . Social Herbalist on phone: Not on file    Gets together: Not on file    Attends religious service: Not on file    Active member of club or organization: Not on file    Attends meetings of clubs or organizations: Not on file    Relationship status: Not on file  . Intimate partner violence    Fear of current or ex partner: Not on file    Emotionally abused: Not on file    Physically abused: Not on file    Forced sexual activity: Not on file  Other Topics Concern  . Not on file  Social History Narrative  . Not on file     PHYSICAL EXAM:  VS: BP 102/78   Ht 5\' 5"  (1.651 m)   Wt 265 lb (120.2 kg)   BMI 44.10 kg/m  Physical Exam Gen: NAD, alert,  cooperative with exam, well-appearing ENT: normal lips, normal nasal mucosa,  Eye: normal EOM, normal conjunctiva and lids CV:  no edema, +2 pedal pulses   Resp: no accessory muscle use, non-labored,  Skin: no rashes, no areas of induration  Neuro: normal tone, normal sensation to touch Psych:  normal insight, alert and oriented MSK:  Left and right shoulder: Normal active flexion and abduction. Normal internal and external rotation. Normal strength resistance. Normal empty can testing. Negative O'Brien's test. Back: Normal flexion and extension. Normal internal and external rotation of the hips. Normal strength resistance with hip flexion. Negative straight leg raise. Right and left  knee: No obvious effusion. No tenderness to palpation over the quadricep patellar tendon. No significant medial or lateral joint line tenderness to palpation. No instability with valgus or varus stress testing. Negative McMurray's test. No pain with patellar grind. Neurovascularly intact     ASSESSMENT & PLAN:   Acute bilateral low back pain without sciatica Pain is likely an exacerbation of an underlying degenerative change as well as muscle spasm.  Likely associated with long periods of sitting and having some deconditioning.  Avoiding anti-inflammatories due to Eliquis. -X-ray. -Referral to physical therapy. -Counseled on home exercise therapy and supportive care. -Could consider trigger point injections.  Acute pain of both shoulders Acute in nature.  Has good range of motion.  May have a component of hypermobility as she demonstrates this in her hands and elbow but not in the lower extremity.  Posture is likely contributing as she sits at a computer for 8 hours a day.  Avoiding anti-inflammatories due to Eliquis. -X-ray. -Counseled on over-the-counter Voltaren. -Counseled on home exercise therapy and supportive care. -Referral to physical therapy. -Could consider injections  Popping  sound of knee joint She reports more of a popping than any significant pain.  She does demonstrate degenerative changes on previous imaging.  Likely has component of arthritis that is associated.  Likely hip abduction weakness is contributing as well. -Counseled on home exercise therapy and supportive care. -Could get updated imaging. -Could consider injections.

## 2019-03-23 ENCOUNTER — Ambulatory Visit (HOSPITAL_COMMUNITY)
Admission: RE | Admit: 2019-03-23 | Discharge: 2019-03-23 | Disposition: A | Discharge status: Home / Self Care | Payer: BC Managed Care – PPO | Source: Ambulatory Visit | Attending: Hematology | Admitting: Hematology

## 2019-03-23 DIAGNOSIS — D472 Monoclonal gammopathy: Secondary | ICD-10-CM

## 2019-03-23 DIAGNOSIS — M25512 Pain in left shoulder: Secondary | ICD-10-CM | POA: Diagnosis not present

## 2019-03-23 DIAGNOSIS — M25511 Pain in right shoulder: Secondary | ICD-10-CM | POA: Diagnosis not present

## 2019-03-23 LAB — GLUCOSE, CAPILLARY: Glucose-Capillary: 101 mg/dL — ABNORMAL HIGH (ref 70–99)

## 2019-03-23 MED ORDER — FLUDEOXYGLUCOSE F - 18 (FDG) INJECTION
13.2500 | Freq: Once | INTRAVENOUS | Status: AC | PRN
  Administered 2019-03-23: 13.25 via INTRAVENOUS

## 2019-03-26 ENCOUNTER — Other Ambulatory Visit: Payer: Self-pay

## 2019-03-26 ENCOUNTER — Telehealth: Payer: Self-pay | Admitting: Family Medicine

## 2019-03-26 ENCOUNTER — Ambulatory Visit (HOSPITAL_COMMUNITY)
Admission: RE | Admit: 2019-03-26 | Discharge: 2019-03-26 | Disposition: A | Discharge status: Home / Self Care | Payer: BC Managed Care – PPO | Source: Ambulatory Visit | Attending: Cardiology | Admitting: Cardiology

## 2019-03-26 ENCOUNTER — Other Ambulatory Visit: Payer: Self-pay | Admitting: Radiology

## 2019-03-26 DIAGNOSIS — I739 Peripheral vascular disease, unspecified: Secondary | ICD-10-CM | POA: Insufficient documentation

## 2019-03-26 DIAGNOSIS — D472 Monoclonal gammopathy: Secondary | ICD-10-CM | POA: Diagnosis not present

## 2019-03-26 NOTE — Telephone Encounter (Signed)
Left VM for patient. If she calls back please have her speak with a nurse/CMA and inform that her shoulder joint appears to have degenerative changes that are likely contributing to her pain. Could consider a joint injection.   If any questions then please take the best time and phone number to call and I will try to call her back.   Rosemarie Ax, MD [facility] 03/26/2019, 8:17 AM

## 2019-03-27 ENCOUNTER — Ambulatory Visit (HOSPITAL_COMMUNITY)
Admission: RE | Admit: 2019-03-27 | Discharge: 2019-03-27 | Disposition: A | Discharge status: Home / Self Care | Payer: BC Managed Care – PPO | Source: Ambulatory Visit | Attending: Hematology | Admitting: Hematology

## 2019-03-27 ENCOUNTER — Ambulatory Visit (HOSPITAL_COMMUNITY)
Admission: RE | Admit: 2019-03-27 | Payer: BC Managed Care – PPO | Source: Ambulatory Visit | Attending: Cardiovascular Disease | Admitting: Cardiovascular Disease

## 2019-03-27 ENCOUNTER — Other Ambulatory Visit: Payer: Self-pay | Admitting: Radiology

## 2019-03-27 DIAGNOSIS — D472 Monoclonal gammopathy: Secondary | ICD-10-CM

## 2019-03-28 ENCOUNTER — Encounter: Payer: Self-pay | Admitting: Family Medicine

## 2019-03-28 ENCOUNTER — Ambulatory Visit (HOSPITAL_COMMUNITY)
Admission: RE | Admit: 2019-03-28 | Discharge: 2019-03-28 | Disposition: A | Discharge status: Home / Self Care | Payer: BC Managed Care – PPO | Source: Ambulatory Visit | Attending: Hematology | Admitting: Hematology

## 2019-03-28 ENCOUNTER — Other Ambulatory Visit: Payer: Self-pay

## 2019-03-28 ENCOUNTER — Encounter (HOSPITAL_COMMUNITY): Payer: Self-pay

## 2019-03-28 ENCOUNTER — Ambulatory Visit (HOSPITAL_COMMUNITY)
Admission: RE | Admit: 2019-03-28 | Discharge: 2019-03-28 | Disposition: A | Discharge status: Home / Self Care | Payer: BC Managed Care – PPO | Source: Ambulatory Visit | Attending: Cardiovascular Disease | Admitting: Cardiovascular Disease

## 2019-03-28 DIAGNOSIS — I10 Essential (primary) hypertension: Secondary | ICD-10-CM | POA: Diagnosis not present

## 2019-03-28 DIAGNOSIS — Z79899 Other long term (current) drug therapy: Secondary | ICD-10-CM | POA: Diagnosis not present

## 2019-03-28 DIAGNOSIS — Z8249 Family history of ischemic heart disease and other diseases of the circulatory system: Secondary | ICD-10-CM | POA: Diagnosis not present

## 2019-03-28 DIAGNOSIS — D472 Monoclonal gammopathy: Secondary | ICD-10-CM | POA: Diagnosis not present

## 2019-03-28 DIAGNOSIS — Z7902 Long term (current) use of antithrombotics/antiplatelets: Secondary | ICD-10-CM | POA: Diagnosis not present

## 2019-03-28 DIAGNOSIS — Z888 Allergy status to other drugs, medicaments and biological substances status: Secondary | ICD-10-CM | POA: Diagnosis not present

## 2019-03-28 DIAGNOSIS — I739 Peripheral vascular disease, unspecified: Secondary | ICD-10-CM | POA: Diagnosis not present

## 2019-03-28 DIAGNOSIS — Z862 Personal history of diseases of the blood and blood-forming organs and certain disorders involving the immune mechanism: Secondary | ICD-10-CM | POA: Diagnosis not present

## 2019-03-28 DIAGNOSIS — Z7901 Long term (current) use of anticoagulants: Secondary | ICD-10-CM | POA: Insufficient documentation

## 2019-03-28 DIAGNOSIS — E559 Vitamin D deficiency, unspecified: Secondary | ICD-10-CM | POA: Diagnosis not present

## 2019-03-28 LAB — CBC WITH DIFFERENTIAL/PLATELET
Abs Immature Granulocytes: 0.02 10*3/uL (ref 0.00–0.07)
Basophils Absolute: 0 10*3/uL (ref 0.0–0.1)
Basophils Relative: 1 %
Eosinophils Absolute: 0.1 10*3/uL (ref 0.0–0.5)
Eosinophils Relative: 1 %
HCT: 39.9 % (ref 36.0–46.0)
Hemoglobin: 13 g/dL (ref 12.0–15.0)
Immature Granulocytes: 0 %
Lymphocytes Relative: 20 %
Lymphs Abs: 1.5 10*3/uL (ref 0.7–4.0)
MCH: 31.1 pg (ref 26.0–34.0)
MCHC: 32.6 g/dL (ref 30.0–36.0)
MCV: 95.5 fL (ref 80.0–100.0)
Monocytes Absolute: 0.7 10*3/uL (ref 0.1–1.0)
Monocytes Relative: 9 %
Neutro Abs: 5.2 10*3/uL (ref 1.7–7.7)
Neutrophils Relative %: 69 %
Platelets: 229 10*3/uL (ref 150–400)
RBC: 4.18 MIL/uL (ref 3.87–5.11)
RDW: 13.6 % (ref 11.5–15.5)
WBC: 7.6 10*3/uL (ref 4.0–10.5)
nRBC: 0 % (ref 0.0–0.2)

## 2019-03-28 LAB — PROTIME-INR
INR: 1.2 (ref 0.8–1.2)
Prothrombin Time: 14.9 seconds (ref 11.4–15.2)

## 2019-03-28 LAB — APTT: aPTT: 31 seconds (ref 24–36)

## 2019-03-28 MED ORDER — FLUMAZENIL 0.5 MG/5ML IV SOLN
INTRAVENOUS | Status: AC
  Filled 2019-03-28: qty 5

## 2019-03-28 MED ORDER — FENTANYL CITRATE (PF) 100 MCG/2ML IJ SOLN
INTRAMUSCULAR | Status: AC | PRN
  Administered 2019-03-28 (×3): 50 ug via INTRAVENOUS

## 2019-03-28 MED ORDER — NALOXONE HCL 0.4 MG/ML IJ SOLN
INTRAMUSCULAR | Status: AC
  Filled 2019-03-28: qty 1

## 2019-03-28 MED ORDER — SODIUM CHLORIDE 0.9 % IV SOLN
INTRAVENOUS | Status: AC
  Filled 2019-03-28: qty 250

## 2019-03-28 MED ORDER — MIDAZOLAM HCL 2 MG/2ML IJ SOLN
INTRAMUSCULAR | Status: AC
  Filled 2019-03-28: qty 4

## 2019-03-28 MED ORDER — LIDOCAINE HCL (PF) 1 % IJ SOLN
INTRAMUSCULAR | Status: AC | PRN
  Administered 2019-03-28: 10 mL

## 2019-03-28 MED ORDER — FENTANYL CITRATE (PF) 100 MCG/2ML IJ SOLN
INTRAMUSCULAR | Status: AC
  Filled 2019-03-28: qty 2

## 2019-03-28 MED ORDER — SODIUM CHLORIDE 0.9 % IV SOLN
INTRAVENOUS | Status: DC
  Administered 2019-03-28: 08:00:00 via INTRAVENOUS

## 2019-03-28 MED ORDER — MIDAZOLAM HCL 2 MG/2ML IJ SOLN
INTRAMUSCULAR | Status: AC | PRN
  Administered 2019-03-28 (×3): 1 mg via INTRAVENOUS

## 2019-03-28 NOTE — Consult Note (Signed)
Chief Complaint: Patient was seen in consultation today for CT-guided bone marrow biopsy  Referring Physician(s): Zhao,Yan  Supervising Physician: Sandi Mariscal  Patient Status: [facility] - Out-pt  History of Present Illness: Christina [name] is a 53 y.o. female with history of IgA kappa MGUS who presents today for CT-guided bone marrow biopsy for further evaluation.  Additional medical history as below.  Past Medical History:  Diagnosis Date   Acute blood loss as cause of postoperative anemia    Anemia 01/22/2019   Chest pain    a. prior coronary CT without CAD.   Elevated sed rate 01/22/2019   Essential hypertension 07/08/2016   GERD (gastroesophageal reflux disease)    PAD (peripheral artery disease) ([place])    a. 05/2018: PV angio subacute thrombotic occlusion of her popliteal artery. She underwent successful penumbra aspiration thrombectomy, PTA and self-expanding stenting using overlapping Tigris self-expanding stents of a long thrombotic occlusion of the popliteal, anterior tibial and tibioperoneal trunk. Procedure c/b pseudoaneurysm/ABL anemia.   Pseudoaneurysm following procedure [facility]: Lambert Rhonda W)    Uterine fibroid    Vitamin D deficiency     Past Surgical History:  Procedure Laterality Date   ABDOMINAL AORTOGRAM W/LOWER EXTREMITY Right 05/08/2018   Procedure: ABDOMINAL AORTOGRAM W/LOWER EXTREMITY;  Surgeon: Lorretta Harp, MD;  Location: [place] [facility];  Service: Cardiovascular;  Laterality: Right;   lump removed from breast at age 80     PERIPHERAL VASCULAR INTERVENTION Right 05/08/2018   Procedure: PERIPHERAL VASCULAR INTERVENTION;  Surgeon: Lorretta Harp, MD;  Location: [place] [facility];  Service: Cardiovascular;  Laterality: Right;  Anterior tibial and popliteal stents   PERIPHERAL VASCULAR THROMBECTOMY Right 05/08/2018   Procedure: PERIPHERAL VASCULAR THROMBECTOMY;  Surgeon: Lorretta Harp, MD;  Location: [place] [facility];  Service: Cardiovascular;  Laterality:  Right;  Popliteal, tibioperoneal trunk, Anterior tibial, Posterior tibial    Allergies: Lisinopril  Medications: Prior to Admission medications   Medication Sig Start Date End Date Taking? Authorizing Provider  apixaban (ELIQUIS) 5 MG TABS tablet Take 1 tablet (5 mg total) by mouth 2 (two) times daily. 05/31/18  Yes Lorretta Harp, MD  atorvastatin (LIPITOR) 80 MG tablet Take 1 tablet (80 mg total) by mouth daily. 01/19/19  Yes Libby Maw, MD  carvedilol (COREG) 6.25 MG tablet Take 1 tablet (6.25 mg total) by mouth 2 (two) times daily. 03/13/19  Yes Nahser, Wonda Cheng, MD  clopidogrel (PLAVIX) 75 MG tablet Take 1 tablet (75 mg total) by mouth daily. 06/08/18  Yes Lorretta Harp, MD  hydrochlorothiazide (MICROZIDE) 12.5 MG capsule Take 1 capsule (12.5 mg total) by mouth daily. 01/19/19 01/14/20 Yes Nahser, Wonda Cheng, MD  megestrol (MEGACE) 40 MG tablet Take 1 tablet (40 mg total) by mouth 2 (two) times daily. 02/14/19  Yes Lavonia Drafts, MD  methocarbamol (ROBAXIN) 500 MG tablet Take 1 tablet (500 mg total) by mouth every 8 (eight) hours as needed for muscle spasms. 03/12/19  Yes Libby Maw, MD  potassium chloride (K-DUR) 10 MEQ tablet Take 1 tablet (10 mEq total) by mouth 3 (three) times a week. 09/20/18  Yes Lorretta Harp, MD  sucralfate (CARAFATE) 1 GM/10ML suspension Take 10 mLs (1 g total) by mouth every 6 (six) hours as needed. 03/22/19  Yes Armbruster, Carlota Raspberry, MD  valsartan (DIOVAN) 160 MG tablet Take 1 tablet (160 mg total) by mouth daily. TO REPLACE LOSARTAN 08/28/18  Yes Lorretta Harp, MD  vitamin B-12 (CYANOCOBALAMIN) 1000 MCG tablet Take 1,000  mcg by mouth daily.   Yes [provider]  Vitamin D, Ergocalciferol, (DRISDOL) 1.25 MG (50000 UT) CAPS capsule Take 1 capsule (50,000 Units total) by mouth every 7 (seven) days. 10/23/18 04/21/19 Yes Libby Maw, MD     Family History  Problem Relation Age of Onset   Hypertension  Mother    Alzheimer's disease Father    Healthy Son    Colon polyps Neg Hx    Crohn's disease Neg Hx    Rectal cancer Neg Hx    Stomach cancer Neg Hx    Pancreatic cancer Neg Hx     Social History   Socioeconomic History   Marital status: Single    Spouse name: Not on file   Number of children: Not on file   Years of education: Not on file   Highest education level: Not on file  Occupational History   Not on file  Social Needs   Financial resource strain: Not on file   Food insecurity    Worry: Not on file    Inability: Not on file   Transportation needs    Medical: Not on file    Non-medical: Not on file  Tobacco Use   Smoking status: Never Smoker   Smokeless tobacco: Never Used  Substance and Sexual Activity   Alcohol use: No   Drug use: No   Sexual activity: Yes  Lifestyle   Physical activity    Days per week: Not on file    Minutes per session: Not on file   Stress: Not on file  Relationships   Social connections    Talks on phone: Not on file    Gets together: Not on file    Attends religious service: Not on file    Active member of club or organization: Not on file    Attends meetings of clubs or organizations: Not on file    Relationship status: Not on file  Other Topics Concern   Not on file  Social History Narrative   Not on file      Review of Systems currently denies fever, headache, chest pain, dyspnea, cough, abdominal/back pain, nausea, vomiting or bleeding.  She is extremely anxious.  She does have some shoulder pain and arthralgias.  Vital Signs: Blood pressure 147/81, temp 99.1, heart rate 67, respirations 18, O2 sat 97% room air Ht '5\' 5"'$  (1.651 m)    Wt 265 lb (120.2 kg)    BMI 44.10 kg/m   Physical Exam awake, alert.  Chest clear to auscultation bilaterally.  Heart with regular rate and rhythm.  Abdomen obese, soft, positive bowel sounds, nontender.    Imaging: Dg Shoulder Right  Result Date:  03/22/2019 CLINICAL DATA:  53 year old female with shoulder pain EXAM: RIGHT SHOULDER - 2+ VIEW COMPARISON:  None. FINDINGS: Glenohumeral joint appears congruent. Degenerative changes of the [facility] joint. Degenerative changes of the [facility] joint. No acute displaced fracture. No focal soft tissue swelling. No radiopaque foreign body. IMPRESSION: Negative for acute bony abnormalities. Degenerative changes of the [facility] joint and the [facility] joint Electronically Signed   By: Corrie Mckusick D.O.   On: 03/22/2019 09:42   Nm Pet Image Initial (pi) Whole Body  Result Date: 03/24/2019 CLINICAL DATA:  initial treatment strategy for monoclonal gammopathy of indeterminate significance. EXAM: NUCLEAR MEDICINE PET WHOLE BODY TECHNIQUE: 13.3 mCi F-18 FDG was injected intravenously. Full-ring PET imaging was performed from the skull base to thigh after the radiotracer. CT data was obtained and used  for attenuation correction and anatomic localization. Fasting blood glucose: 101 mg/dl COMPARISON:  Abdominopelvic CT 05/11/2018.  Chest CT 11/12/2017. FINDINGS: Mediastinal blood pool activity: SUV max 3.2 Exam degradation is mild secondary to patient body habitus. HEAD/NECK: No areas of abnormal hypermetabolism. [place] tonsil hypermetabolism is symmetric and favored to be physiologic. Incidental CT findings: No cervical adenopathy. CHEST: No pulmonary parenchymal or thoracic nodal hypermetabolism. Incidental CT findings: Aortic atherosclerosis. Tiny hiatal hernia. 4 mm right upper lobe pulmonary nodule on 18/8. ABDOMEN/PELVIS: No abdominopelvic parenchymal or nodal hypermetabolism. Incidental CT findings: Normal adrenal glands. No abdominopelvic adenopathy. Fibroid uterus. Abdominal aortic atherosclerosis. SKELETON: Marrow hypermetabolism within the pelvis is mildly heterogeneous, favored to be within normal variation. No well-defined focal hypermetabolism. Incidental CT findings: No focal osseous lesion. EXTREMITIES: Hypermetabolism at both  Achilles insertions is likely degenerative. Incidental CT findings: none IMPRESSION: 1. No findings of hypermetabolic soft tissue or osseous myeloma. 2. Mild limitations secondary to patient body habitus. 3.  Aortic Atherosclerosis (ICD10-I70.0). 4. 4 mm right upper lobe pulmonary nodule. No follow-up needed if patient is low-risk. Non-contrast chest CT can be considered in 12 months if patient is high-risk. This recommendation follows the consensus statement: Guidelines for Management of Incidental Pulmonary Nodules Detected on CT Images: From the Fleischner Society 2017; Radiology 2017; 284:228-243. 5. Fibroid uterus. Electronically Signed   By: Abigail Miyamoto M.D.   On: 03/24/2019 11:52   Dg Shoulder Left  Result Date: 03/22/2019 CLINICAL DATA:  53 year old female with a history of left-sided shoulder pain EXAM: LEFT SHOULDER - 2+ VIEW COMPARISON:  None. FINDINGS: Glenohumeral joint appears congruent. Degenerative changes of the [facility] joint. No focal soft tissue swelling. No radiopaque foreign body. No evidence of joint effusion. No acute displaced fracture. IMPRESSION: Negative for acute bony abnormality. Mild degenerative changes of the [facility] joint. Electronically Signed   By: Corrie Mckusick D.O.   On: 03/22/2019 09:45   Vas [place] Burnard Bunting With/wo Tbi  Result Date: 03/28/2019 LOWER EXTREMITY DOPPLER STUDY Indications: Claudication, peripheral artery disease, and Patient complains of              right calf pain with walking for long distances, especially walking              around [place]. She complains of her right knee down to her foot              hurting. She states it feels like it did prior to procedure on              05/08/2018. High Risk Factors: Hypertension, no history of smoking. Other Factors: OBESITY                 SEE RIGHT LOWER LEG ARTERIAL REPORT.  Vascular Interventions: 05/08/2018 Penumbra aspiration thrombectomy right                         popliteal artery, anterior tibial artery, tibioperoneal                          trunk and posterior tibial artery. Chocolate balloon                         angioplasty right popliteal artery and anterior tibial                         artery. Tigris stenting right popliteal artery  and                         anterior tibial artery. Comparison Study: Prior ABI done 12/12/2018 Right .96 left 1.24. Performing Technologist: Salvadore Dom RVT, RDCS (AE), RDMS  Examination Guidelines: A complete evaluation includes at minimum, Doppler waveform signals and systolic blood pressure reading at the level of bilateral brachial, anterior tibial, and posterior tibial arteries, when vessel segments are accessible. Bilateral testing is considered an integral part of a complete examination. Photoelectric Plethysmograph (PPG) waveforms and toe systolic pressure readings are included as required and additional duplex testing as needed. Limited examinations for reoccurring indications may be performed as noted.  ABI Findings: +---------+------------------+-----+---------------+--------+  Right     Rt Pressure (mmHg) Index Waveform        Comment   +---------+------------------+-----+---------------+--------+  Brachial  120                                                +---------+------------------+-----+---------------+--------+  ATA       102                0.85  monophasic                +---------+------------------+-----+---------------+--------+  PTA       108                0.90  barely biphasic           +---------+------------------+-----+---------------+--------+  PERO      112                0.93  monophasic                +---------+------------------+-----+---------------+--------+  Great Toe 88                 0.73  Abnormal                  +---------+------------------+-----+---------------+--------+ +---------+------------------+-----+---------+-------+  Left      Lt Pressure (mmHg) Index Waveform  Comment  +---------+------------------+-----+---------+-------+  Brachial  120                                          +---------+------------------+-----+---------+-------+  ATA       124                1.03  triphasic          +---------+------------------+-----+---------+-------+  PTA       134                1.12  triphasic          +---------+------------------+-----+---------+-------+  PERO      131                1.09  triphasic          +---------+------------------+-----+---------+-------+  Great Toe 95                 0.79  Abnormal           +---------+------------------+-----+---------+-------+ +-------+-----------+-----------+------------+------------+  ABI/TBI Today's ABI Today's TBI Previous ABI Previous TBI  +-------+-----------+-----------+------------+------------+  Right   .93         .73         .  96          .82           +-------+-----------+-----------+------------+------------+  Left    1.12        .79         1.24         .89           +-------+-----------+-----------+------------+------------+ Right ABIs and TBIs appear decreased compared to prior study on 12/12/18. Left ABIs and TBIs appear essentially unchanged compared to prior study on 12/12/18. Slight decrease in the right side since prior exam.  Summary: Right: Resting right ankle-brachial index indicates mild right lower extremity arterial disease. The right toe-brachial index is normal. Left: Resting left ankle-brachial index is within normal range. No evidence of significant left lower extremity arterial disease. The left toe-brachial index is normal.  *See table(s) above for measurements and observations.  Electronically signed by Kathlyn Sacramento MD on 03/28/2019 at 8:25:56 AM.    Final    Vas [place] Lower Extremity Arterial Duplex  Result Date: 03/26/2019 LOWER EXTREMITY ARTERIAL DUPLEX STUDY Indications: Claudication, and Patient complains of right calf pain with walking              for long distances, especially walking around [place]. She              complains of her right knee down to her foot hurting.  She states it              feels like it did prior to procedure on 05/08/2018. High Risk Factors: Hypertension, no history of smoking. Other Factors: SEE ABI REPORT.  Vascular Interventions: 05/08/2018 Penumbra aspiration thrombectomy right                         popliteal artery, anterior tibial artery, tibioperoneal                         trunk and posterior tibial artery. Chocolate balloon                         angioplasty right popliteal artery and anterior tibial                         artery. Tigris stenting right popliteal artery and                         anterior tibial artery. Current ABI:            Right .93 Left 1.12 Limitations: OBESITY Comparison Study: Prior right lower leg arterial duplex exam on 12/12/2018 showed                   a patent below knee popiteal to proximal anterior tibial                   artery stent with highest velocity of 191 cm/s in the mid                   stent. Performing Technologist: Salvadore Dom RVT, RDCS (AE), RDMS  Examination Guidelines: A complete evaluation includes B-mode imaging, spectral Doppler, color Doppler, and power Doppler as needed of all accessible portions of each vessel. Bilateral testing is considered an integral part of a complete examination. Limited examinations for reoccurring indications may be performed as noted.  +----------+--------+-----+---------------+----------+-------------------+  RIGHT      PSV cm/s Ratio Stenosis        Waveform   Comments             +----------+--------+-----+---------------+----------+-------------------+  CFA Prox   142                            triphasic                       +----------+--------+-----+---------------+----------+-------------------+  CFA Distal 66                             triphasic                       +----------+--------+-----+---------------+----------+-------------------+  DFA        111                            triphasic                        +----------+--------+-----+---------------+----------+-------------------+  SFA Prox   105                            triphasic                       +----------+--------+-----+---------------+----------+-------------------+  SFA Mid    83                             triphasic                       +----------+--------+-----+---------------+----------+-------------------+  SFA Distal 81                             triphasic                       +----------+--------+-----+---------------+----------+-------------------+  POP Prox   107                            triphasic                       +----------+--------+-----+---------------+----------+-------------------+  POP Mid                                              SEE STENT WORKSHEET  +----------+--------+-----+---------------+----------+-------------------+  TP Trunk   218            50-74% stenosis            low end range        +----------+--------+-----+---------------+----------+-------------------+  ATA Prox   102                            biphasic                        +----------+--------+-----+---------------+----------+-------------------+  ATA Distal 47  biphasic                        +----------+--------+-----+---------------+----------+-------------------+  PTA Prox   88                             biphasic                        +----------+--------+-----+---------------+----------+-------------------+  PTA Distal 55                             monophasic                      +----------+--------+-----+---------------+----------+-------------------+ Difficult to see the distal stent struts due to patient body habitus.  Right Stent(s): +-----------------------------+--------+---------------+---------+-------------+  below knee popiteal to        PSV cm/s Stenosis        Waveform  Comments        proximal anterior tibial                                                         artery stent                                                                     +-----------------------------+--------+---------------+---------+-------------+  Prox to Stent                 60                       biphasic                 +-----------------------------+--------+---------------+---------+-------------+  Proximal Stent                126                      biphasic                 +-----------------------------+--------+---------------+---------+-------------+  Mid Stent                     189                      triphasic                +-----------------------------+--------+---------------+---------+-------------+  Distal Stent                  161                      triphasic                +-----------------------------+--------+---------------+---------+-------------+  Distal to Stent               218      50-99% stenosis triphasic low end range  +-----------------------------+--------+---------------+---------+-------------+ Technically challenging scan due to patients body habitus.   Summary:  Right: Patent stent with no evidence of stenosis in the below knee popiteal to proximal anterior tibial artery artery. Mild progression is noted compared to previous study. The stent appears to be patent with a slight increase in velocity of the TP trunk, low end range of 50-74% stenosis.  See table(s) above for measurements and observations. Patient is scheduled to see Dr. Gwenlyn Found on 04/10/2019 at 4:30 pm.    Preliminary     Labs:  CBC: Recent Labs    10/20/18 0941 01/22/19 1023 03/02/19 0833 03/28/19 0714  WBC 7.1 6.2 6.8 7.6  HGB 13.5 13.7 13.2 13.0  HCT 40.7 41.0 40.4 39.9  PLT 245 248.0 257 229    COAGS: Recent Labs    03/28/19 0714  INR 1.2  APTT 31    BMP: Recent Labs    05/10/18 0511 05/18/18 1217 07/14/18 0814 10/20/18 0941 01/22/19 1023 03/02/19 0833  NA 137 141 140 139 140 136  K 3.6 4.4 4.5 4.5 4.0 4.1  CL 110 107* 104 107 108 105  CO2 20* _0 GLUCOSE 97 82 90 90 94 109*  BUN _1 CALCIUM 8.7* 9.1 9.7 9.6 10.0 10.0  CREATININE 0.81 0.74 0.92 0.80 0.86 0.97  GFRNONAA >60 92 71  --   --  >60  GFRAA >60 106 82  --   --  >60    LIVER FUNCTION TESTS: Recent Labs    04/03/18 1546 09/05/18 0809 10/20/18 0941 02/05/19 1011 03/02/19 0833  BILITOT 0.4 0.3 0.8  --  0.8  AST _2 --  14*  ALT _3 --  15  ALKPHOS 89 120*  --   --  89  PROT 7.2 7.0 7.1 7.9 7.8  ALBUMIN 4.0 4.1  --   --  4.5    TUMOR MARKERS: No results for input(s): AFPTM, CEA, CA199, CHROMGRNA in the last 8760 hours.  Assessment and Plan: 53 y.o. female with history of IgA kappa MGUS who presents today for CT-guided bone marrow biopsy for further evaluation.Risks and benefits of procedure was discussed with the patient  including, but not limited to bleeding, infection, damage to adjacent structures or low yield requiring additional tests.  All of the questions were answered and there is agreement to proceed.  Consent signed and in chart.     Thank you for this interesting consult.  I greatly enjoyed meeting Jillian Warth and look forward to participating in their care.  A copy of this report was sent to the requesting provider on this date.  Electronically Signed: D. Rowe Robert, PA-C 03/28/2019, 8:30 AM   I spent a total of  20 minutes   in face to face in clinical consultation, greater than 50% of which was counseling/coordinating care for CT-guided bone marrow biopsy

## 2019-03-28 NOTE — Discharge Instructions (Signed)
Bone Marrow Aspiration and Bone Marrow Biopsy, Adult, Care After °This sheet gives you information about how to care for yourself after your procedure. Your health care provider may also give you more specific instructions. If you have problems or questions, contact your health care provider. °What can I expect after the procedure? °After the procedure, it is common to have: °· Mild pain and tenderness. °· Swelling. °· Bruising. °Follow these instructions at home: °Puncture site care ° °  ° °· Follow instructions from your health care provider about how to take care of the puncture site. Make sure you: °? Wash your hands with soap and water before you change your bandage (dressing). If soap and water are not available, use hand sanitizer. °? Change your dressing as told by your health care provider. °· Check your puncture site every day for signs of infection. Check for: °? More redness, swelling, or pain. °? More fluid or blood. °? Warmth. °? Pus or a bad smell. °General instructions °· Take over-the-counter and prescription medicines only as told by your health care provider. °· Do not take baths, swim, or use a hot tub until your health care provider approves. Ask if you can take a shower or have a sponge bath. °· Return to your normal activities as told by your health care provider. Ask your health care provider what activities are safe for you. °· Do not drive for 24 hours if you were given a medicine to help you relax (sedative) during your procedure. °· Keep all follow-up visits as told by your health care provider. This is important. °Contact a health care provider if: °· Your pain is not controlled with medicine. °Get help right away if: °· You have a fever. °· You have more redness, swelling, or pain around the puncture site. °· You have more fluid or blood coming from the puncture site. °· Your puncture site feels warm to the touch. °· You have pus or a bad smell coming from the puncture site. °These  symptoms may represent a serious problem that is an emergency. Do not wait to see if the symptoms will go away. Get medical help right away. Call your local emergency services (911 in the [place] Do not drive yourself to the hospital. °Summary °· After the procedure, it is common to have mild pain, tenderness, swelling, and bruising. °· Follow instructions from your health care provider about how to take care of the puncture site. °· Get help right away if you have any symptoms of infection or if you have more blood or fluid coming from the puncture site. °This information is not intended to replace advice given to you by your health care provider. Make sure you discuss any questions you have with your health care provider. °Document Released: 11/06/2004 Document Revised: 08/02/2017 Document Reviewed: 10/01/2015 °Elsevier Patient Education © 2020 Elsevier Inc[employer] ° ° ° ° °Moderate Conscious Sedation, Adult, Care After °These instructions provide you with information about caring for yourself after your procedure. Your health care provider may also give you more specific instructions. Your treatment has been planned according to current medical practices, but problems sometimes occur. Call your health care provider if you have any problems or questions after your procedure. °What can I expect after the procedure? °After your procedure, it is common: °· To feel sleepy for several hours. °· To feel clumsy and have poor balance for several hours. °· To have poor judgment for several hours. °· To vomit if you eat too soon. °  Follow these instructions at home: °For at least 24 hours after the procedure: ° °· Do not: °? Participate in activities where you could fall or become injured. °? Drive. °? Use heavy machinery. °? Drink alcohol. °? Take sleeping pills or medicines that cause drowsiness. °? Make important decisions or sign legal documents. °? Take care of children on your own. °· Rest. °Eating and drinking °· Follow the  diet recommended by your health care provider. °· If you vomit: °? Drink water, juice, or soup when you can drink without vomiting. °? Make sure you have little or no nausea before eating solid foods. °General instructions °· Have a responsible adult stay with you until you are awake and alert. °· Take over-the-counter and prescription medicines only as told by your health care provider. °· If you smoke, do not smoke without supervision. °· Keep all follow-up visits as told by your health care provider. This is important. °Contact a health care provider if: °· You keep feeling nauseous or you keep vomiting. °· You feel light-headed. °· You develop a rash. °· You have a fever. °Get help right away if: °· You have trouble breathing. °This information is not intended to replace advice given to you by your health care provider. Make sure you discuss any questions you have with your health care provider. °Document Released: 02/07/2013 Document Revised: 04/01/2017 Document Reviewed: 08/09/2015 °Elsevier Patient Education © 2020 Elsevier Inc. ° °

## 2019-03-28 NOTE — Procedures (Signed)
Pre-procedure Diagnosis: IgA kappa MGUS  Post-procedure Diagnosis: Same  Technically successful CT guided bone marrow aspiration and biopsy of left iliac crest.   Complications: None Immediate  EBL: None  Signed: Sandi Mariscal Pager: [phone] 03/28/2019, 12:08 PM

## 2019-03-30 ENCOUNTER — Other Ambulatory Visit: Payer: Self-pay

## 2019-04-02 ENCOUNTER — Other Ambulatory Visit: Payer: Self-pay

## 2019-04-02 ENCOUNTER — Other Ambulatory Visit: Payer: Self-pay | Admitting: *Deleted

## 2019-04-02 DIAGNOSIS — I739 Peripheral vascular disease, unspecified: Secondary | ICD-10-CM

## 2019-04-02 MED ORDER — SUCRALFATE 1 G PO TABS: 1.0000 g | ORAL_TABLET | Freq: Four times a day (QID) | ORAL | 1 refills | Status: DC | PRN

## 2019-04-02 NOTE — Progress Notes (Signed)
vas 

## 2019-04-02 NOTE — Progress Notes (Signed)
Called and spoke to pt. Let her know that PA for carafate suspension was denied.  Switched to tablets and instructed patient regarding making a slurry

## 2019-04-03 ENCOUNTER — Encounter: Payer: Self-pay | Admitting: Hematology

## 2019-04-03 ENCOUNTER — Ambulatory Visit: Payer: BC Managed Care – PPO | Admitting: Hematology

## 2019-04-03 ENCOUNTER — Other Ambulatory Visit: Payer: BC Managed Care – PPO

## 2019-04-03 DIAGNOSIS — M25551 Pain in right hip: Secondary | ICD-10-CM | POA: Diagnosis not present

## 2019-04-03 DIAGNOSIS — M797 Fibromyalgia: Secondary | ICD-10-CM | POA: Diagnosis not present

## 2019-04-03 DIAGNOSIS — D472 Monoclonal gammopathy: Secondary | ICD-10-CM | POA: Diagnosis not present

## 2019-04-03 DIAGNOSIS — M129 Arthropathy, unspecified: Secondary | ICD-10-CM | POA: Diagnosis not present

## 2019-04-04 ENCOUNTER — Ambulatory Visit (HOSPITAL_COMMUNITY)
Admission: RE | Admit: 2019-04-04 | Discharge: 2019-04-04 | Disposition: A | Discharge status: Home / Self Care | Payer: BC Managed Care – PPO | Source: Ambulatory Visit | Attending: Gastroenterology | Admitting: Gastroenterology

## 2019-04-04 ENCOUNTER — Other Ambulatory Visit: Payer: Self-pay

## 2019-04-04 DIAGNOSIS — R1310 Dysphagia, unspecified: Secondary | ICD-10-CM | POA: Diagnosis not present

## 2019-04-04 DIAGNOSIS — K224 Dyskinesia of esophagus: Secondary | ICD-10-CM | POA: Diagnosis not present

## 2019-04-05 ENCOUNTER — Ambulatory Visit: Payer: BC Managed Care – PPO | Admitting: Physical Therapy

## 2019-04-09 ENCOUNTER — Encounter: Payer: Self-pay | Admitting: Hematology

## 2019-04-09 ENCOUNTER — Encounter (HOSPITAL_COMMUNITY): Payer: Self-pay | Admitting: Hematology

## 2019-04-09 ENCOUNTER — Encounter: Payer: Self-pay | Admitting: Family Medicine

## 2019-04-09 LAB — SURGICAL PATHOLOGY

## 2019-04-10 ENCOUNTER — Telehealth: Payer: Self-pay | Admitting: Hematology

## 2019-04-10 ENCOUNTER — Telehealth: Payer: Self-pay

## 2019-04-10 ENCOUNTER — Other Ambulatory Visit: Payer: Self-pay

## 2019-04-10 ENCOUNTER — Ambulatory Visit (INDEPENDENT_AMBULATORY_CARE_PROVIDER_SITE_OTHER): Payer: BC Managed Care – PPO | Admitting: Cardiovascular Disease

## 2019-04-10 ENCOUNTER — Encounter: Payer: Self-pay | Admitting: Cardiovascular Disease

## 2019-04-10 ENCOUNTER — Encounter: Payer: Self-pay | Admitting: Family Medicine

## 2019-04-10 VITALS — BP 138/76 | HR 83 | Temp 97.8°F | Ht 65.0 in | Wt 269.0 lb

## 2019-04-10 DIAGNOSIS — I739 Peripheral vascular disease, unspecified: Secondary | ICD-10-CM | POA: Diagnosis not present

## 2019-04-10 MED ORDER — SODIUM CHLORIDE 0.9% FLUSH: 3.0000 mL | Freq: Two times a day (BID) | INTRAVENOUS | Status: DC

## 2019-04-10 NOTE — Telephone Encounter (Signed)
Appts have been rescheduled per 12/7 phone call from patient.  Updated calendar mailed

## 2019-04-10 NOTE — Progress Notes (Signed)
04/10/2019 Christina [name]   Jun 30, 1965  [id number]  Primary Physician Libby Maw, MD Primary Cardiologist: Lorretta Harp MD Lupe Carney, [place]  HPI:  Christina [name] is a 53 y.o.  severely overweight single African-American female mother of 1 child, grandmother and 3 grandchildren who works Chief Operating Officer at [employer]. She was referred by Dr. Acie Fredrickson for peripheral vascular evaluation because of  right calf claudication which was lifestyle limiting.I last saw her in the office  12/12/2018.She has a history of hypertension. She has chronic chest pain with a recent negative Myoview and a negative coronary CTA 1 year ago. She had fairly recent onset right calf pain approxi-1 month ago that occurred when she woke up 1 morning and has been fairly persistent with ambulation. She had [name] extremity arterial Doppler studies performed 04/28/2018 revealing a right ABI 0.61 with an occluded right popliteal artery.  I performedperipheral angiography 05/08/2018 revealing occluded above-knee popliteal artery extending down below the knee into the proximal anterior tibial and tibioperoneal trunk. She had a long complex procedure with penumbra aspiration thrombectomy followed by balloon angioplasty and implantation of 2 overlapping Tigris self-expanding stents beginning in the popliteal artery extending down into the anterior tibial artery. Unfortunately her tibioperoneal trunk remains occluded. Her Dopplers normalized and her pain is resolved. She did unfortunately have a left common femoral pseudoaneurysm and ultimately underwent ultrasound-guided thrombin injection and successfully was closed.  . She is on Eliquis and clopidogrel. Dopplers performed 05/26/2018 revealed a right ABI of 1.24 with a patent stent. She does continue to complain of atypical chest pain beginning under her right breast and going into her neck and jaw. She had a negative Myoview stress test in December  of last year and a coronary CTA revealing a coronary calcium score of 0 with normal coronary arteries. In addition, she is taken sublingual nitroglycerin for this which does not work. I reassured her that I do not think this is related to coronary artery disease.  Since I saw her 4 months ago she continues to do well.    She describes recurrent claudication over the last 2 months when walking in [place].  Her most recent Dopplers show decline in her right ABI from 1.24 down to 0.93 with a moderate increase in the velocities within the tibioperoneal stent.  Based on this we decided to proceed with outpatient angiography.  Current Meds  Medication Sig   apixaban (ELIQUIS) 5 MG TABS tablet Take 1 tablet (5 mg total) by mouth 2 (two) times daily.   atorvastatin (LIPITOR) 80 MG tablet Take 1 tablet (80 mg total) by mouth daily.   carvedilol (COREG) 6.25 MG tablet Take 1 tablet (6.25 mg total) by mouth 2 (two) times daily.   clopidogrel (PLAVIX) 75 MG tablet Take 1 tablet (75 mg total) by mouth daily.   hydrochlorothiazide (MICROZIDE) 12.5 MG capsule Take 1 capsule (12.5 mg total) by mouth daily.   megestrol (MEGACE) 40 MG tablet Take 1 tablet (40 mg total) by mouth 2 (two) times daily.   methocarbamol (ROBAXIN) 500 MG tablet Take 1 tablet (500 mg total) by mouth every 8 (eight) hours as needed for muscle spasms.   potassium chloride (K-DUR) 10 MEQ tablet Take 1 tablet (10 mEq total) by mouth 3 (three) times a week.   pregabalin (LYRICA) 75 MG capsule    sucralfate (CARAFATE) 1 g tablet Take 1 tablet (1 g total) by mouth every 6 (six) hours as needed. Note: Slowly dissolve  tablet in 1 Tablespoon of distilled water before administration.   valsartan (DIOVAN) 160 MG tablet Take 1 tablet (160 mg total) by mouth daily. TO REPLACE LOSARTAN   vitamin B-12 (CYANOCOBALAMIN) 1000 MCG tablet Take 1,000 mcg by mouth daily.   Vitamin D, Ergocalciferol, (DRISDOL) 1.25 MG (50000 UT) CAPS capsule Take 1  capsule (50,000 Units total) by mouth every 7 (seven) days.     Allergies  Allergen Reactions   Lisinopril Cough    Social History   Socioeconomic History   Marital status: Single    Spouse name: Not on file   Number of children: Not on file   Years of education: Not on file   Highest education level: Not on file  Occupational History   Not on file  Social Needs   Financial resource strain: Not on file   Food insecurity    Worry: Not on file    Inability: Not on file   Transportation needs    Medical: Not on file    Non-medical: Not on file  Tobacco Use   Smoking status: Never Smoker   Smokeless tobacco: Never Used  Substance and Sexual Activity   Alcohol use: No   Drug use: No   Sexual activity: Yes  Lifestyle   Physical activity    Days per week: Not on file    Minutes per session: Not on file   Stress: Not on file  Relationships   Social connections    Talks on phone: Not on file    Gets together: Not on file    Attends religious service: Not on file    Active member of club or organization: Not on file    Attends meetings of clubs or organizations: Not on file    Relationship status: Not on file   Intimate partner violence    Fear of current or ex partner: Not on file    Emotionally abused: Not on file    Physically abused: Not on file    Forced sexual activity: Not on file  Other Topics Concern   Not on file  Social History Narrative   Not on file     Review of Systems: General: negative for chills, fever, night sweats or weight changes.  Cardiovascular: negative for chest pain, dyspnea on exertion, edema, orthopnea, palpitations, paroxysmal nocturnal dyspnea or shortness of breath Dermatological: negative for rash Respiratory: negative for cough or wheezing Urologic: negative for hematuria Abdominal: negative for nausea, vomiting, diarrhea, bright red blood per rectum, melena, or hematemesis Neurologic: negative for visual  changes, syncope, or dizziness All other systems reviewed and are otherwise negative except as noted above.    Blood pressure 138/76, pulse 83, temperature 97.8 F (36.6 C), height 5\' 5"  (1.651 m), weight 269 lb (122 kg).  General appearance: alert and no distress Neck: no adenopathy, no carotid bruit, no JVD, supple, symmetrical, trachea midline and thyroid not enlarged, symmetric, no tenderness/mass/nodules Lungs: clear to auscultation bilaterally Heart: regular rate and rhythm, S1, S2 normal, no murmur, click, rub or gallop Extremities: extremities normal, atraumatic, no cyanosis or edema Pulses: Diminished right pedal pulse Skin: Skin color, texture, turgor normal. No rashes or lesions Neurologic: Alert and oriented X 3, normal strength and tone. Normal symmetric reflexes. Normal coordination and gait  EKG sinus rhythm 83 with borderline LVH voltage.  I personally reviewed this EKG.  ASSESSMENT AND PLAN:   Critical limb ischemia with history of revascularization of same extremity Christina [name] returns today for follow-up of  PAD I performed a long peripheral intervention on her 05/08/2018 secondary to thrombotic occlusion of her popliteal artery.  I performed aspiration thrombectomy, PTA and stenting using Tigris self-expanding stents down into her anterior tibial artery.  Her first post procedure Doppler performed 05/26/2018 revealed a normal ABI on that side with normal velocities.  Her most recent Doppler however performed 03/28/2019 revealed a slight decline in her right ABI from 1.24 down to 0.93 with an increase in her velocities to 218 cm/s.  She has had increased claudication over the last 2 months which is lifestyle limiting.  Based on this I decided to proceed with angiography and potential intervention.      Lorretta Harp MD FACP,FACC,FAHA, [facility] 04/10/2019 5:03 PM

## 2019-04-10 NOTE — Assessment & Plan Note (Signed)
Christina [name] returns today for follow-up of PAD I performed a long peripheral intervention on her 05/08/2018 secondary to thrombotic occlusion of her popliteal artery.  I performed aspiration thrombectomy, PTA and stenting using Tigris self-expanding stents down into her anterior tibial artery.  Her first post procedure Doppler performed 05/26/2018 revealed a normal ABI on that side with normal velocities.  Her most recent Doppler however performed 03/28/2019 revealed a slight decline in her right ABI from 1.24 down to 0.93 with an increase in her velocities to 218 cm/s.  She has had increased claudication over the last 2 months which is lifestyle limiting.  Based on this I decided to proceed with angiography and potential intervention.

## 2019-04-10 NOTE — Patient Instructions (Addendum)
Medication Instructions:  Your physician recommends that you continue on your current medications as directed. Please refer to the Current Medication list given to you today.  If you need a refill on your cardiac medications before your next appointment, please call your pharmacy.   Lab work: CBC, BMET If you have labs (blood work) drawn today and your tests are completely normal, you will receive your results only by: [place] (if you have MyChart) OR A paper copy in the mail If you have any lab test that is abnormal or we need to change your treatment, we will call you to review the results.  Testing/Procedures: Your physician has requested that you have a peripheral vascular angiogram. This exam is performed at the hospital. During this exam IV contrast is used to look at arterial blood flow. Please review the information sheet given for details.  AND  Your physician has requested that you have a lower extremity arterial exercise duplex 1 week after procedure. During this test, exercise and ultrasound are used to evaluate arterial blood flow in the legs. Allow one hour for this exam. There are no restrictions or special instructions.  AND   Your physician has requested that you have an ankle brachial index (ABI)1 week after procedure. During this test an ultrasound and blood pressure cuff are used to evaluate the arteries that supply the arms and legs with blood. Allow thirty minutes for this exam. There are no restrictions or special instructions.   Follow-Up: At [facility], you and your health needs are our priority.  As part of our continuing mission to provide you with exceptional heart care, we have created designated Provider Care Teams.  These Care Teams include your primary Cardiologist (physician) and Advanced Practice Providers (APPs -  Physician Assistants and Nurse Practitioners) who all work together to provide you with the care you need, when you need it. You may  see Dr Gwenlyn Found or one of the following Advanced Practice Providers on your designated Care Team:    Kerin Ransom, PA-C  [place], [place]  Coletta Memos, [place]  Your physician wants you to follow-up in: 2 weeks after procedure.  Any Other Special Instructions Will Be Listed Below (If Applicable).   You are scheduled for a Peripheral Angiogram on Monday, December 14 with Dr. Quay Burow.  1. Please arrive at the [facility] (Main Entrance A) at [facility]: [address] [place], [place] [address] at 5:30 AM (This time is two hours before your procedure to ensure your preparation). Free valet parking service is available.   Special note: Every effort is made to have your procedure done on time. Please understand that emergencies sometimes delay scheduled procedures.  2. Diet: Do not eat solid foods after midnight.  The patient may have clear liquids until 5am upon the day of the procedure.  3. Labs: You will need to have blood drawn this week  4. Medication instructions in preparation for your procedure:  Stop Eliquis 2 days before procedure  Stop taking Eliquis (Apixiban) on Saturday, December 12.   On the morning of your procedure, take your Plavix/Clopidogrel and any morning medicines NOT listed above.  You may use sips of water.  5. Plan for one night stay--bring personal belongings. 6. Bring a current list of your medications and current insurance cards. 7. You MUST have a responsible person to drive you home. 8. Someone MUST be with you the first 24 hours after you arrive home or your discharge  will be delayed. 9. Please wear clothes that are easy to get on and off and wear slip-on shoes.  You have to be tested for Covid. Your test is scheduled for Thursday 12/10 at 11:45am. This is a Drive Up Visit at the [facility] [address], [place]. Someone will direct you to the appropriate testing line. Stay in your car and someone will be with you  shortly.  Thank you for allowing [place] to care for you!   -- [facility]

## 2019-04-10 NOTE — Telephone Encounter (Signed)
-----   Message from Yetta Flock, MD sent at 04/08/2019  4:17 PM EST ----- Jan can you please contact this patient to coordinate routine office follow up? thanks   Results relayed to patient via the Mychart message as outlined below:   Dear Ms. Vollman,  This message is to relay the results of your recent barium swallow to evaluate your esophagus.  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  This can be a common finding in which some parts of your esophagus do not contract as well as it should, and sometimes this can cause swallowing difficulty or discomfort.  I do not see anything else concerning on this exam.  There is no evidence of cancerous changes or anything like that.  I think the symptoms you have experienced in your chest could either be due to spasms of the esophagus or this dysmotility issue which we have discussed in the past.  If this continues to bother you, please follow-up with me in the office to discuss this matter further, as it can be challenging to manage at times.  I will have our office contact you to touch base about follow-up plans.  Parmer Cellar, MD [facility]

## 2019-04-10 NOTE — Telephone Encounter (Signed)
Called pt.  Scheduled her for an OV in January.  Placed her on the wait list for December. Her preference is to be seen in 2020 since she has met her deductible.

## 2019-04-10 NOTE — Telephone Encounter (Signed)
Called and LVM for pt to try to move appt up to 4:00 12/8

## 2019-04-11 ENCOUNTER — Encounter: Payer: Self-pay | Admitting: Hematology

## 2019-04-11 ENCOUNTER — Telehealth: Payer: Self-pay

## 2019-04-11 ENCOUNTER — Telehealth: Payer: Self-pay | Admitting: Cardiovascular Disease

## 2019-04-11 NOTE — Telephone Encounter (Signed)
The patient called this morning and wanted to let Lonn [place] know that her surgeon is trying to schedule her surgery for this coming Monday. The patient does not know what to do and would like Kayla's advice. This surgery was discussed yesterday at her appointment

## 2019-04-11 NOTE — Telephone Encounter (Signed)
Pt called to cancel her PV Angio on 12/14 - states she will call back in Jan. 2021 to reschedule.

## 2019-04-11 NOTE — Telephone Encounter (Signed)
LM2CB about pt concerns with procedure

## 2019-04-12 ENCOUNTER — Other Ambulatory Visit (HOSPITAL_COMMUNITY): Payer: BC Managed Care – PPO

## 2019-04-13 ENCOUNTER — Encounter: Payer: Self-pay | Admitting: Hematology

## 2019-04-16 ENCOUNTER — Encounter (HOSPITAL_COMMUNITY): Payer: Self-pay

## 2019-04-16 ENCOUNTER — Ambulatory Visit (HOSPITAL_COMMUNITY): Admit: 2019-04-16 | Payer: BC Managed Care – PPO | Admitting: Cardiovascular Disease

## 2019-04-16 SURGERY — ABDOMINAL AORTOGRAM W/LOWER EXTREMITY
Anesthesia: LOCAL

## 2019-04-17 ENCOUNTER — Ambulatory Visit (INDEPENDENT_AMBULATORY_CARE_PROVIDER_SITE_OTHER): Payer: BC Managed Care – PPO | Admitting: Family Medicine

## 2019-04-17 ENCOUNTER — Other Ambulatory Visit: Payer: BC Managed Care – PPO

## 2019-04-17 ENCOUNTER — Other Ambulatory Visit: Payer: Self-pay

## 2019-04-17 ENCOUNTER — Ambulatory Visit: Payer: BC Managed Care – PPO | Admitting: Hematology

## 2019-04-17 ENCOUNTER — Ambulatory Visit: Payer: Self-pay

## 2019-04-17 ENCOUNTER — Encounter: Payer: Self-pay | Admitting: Family Medicine

## 2019-04-17 ENCOUNTER — Ambulatory Visit (HOSPITAL_BASED_OUTPATIENT_CLINIC_OR_DEPARTMENT_OTHER)
Admission: RE | Admit: 2019-04-17 | Discharge: 2019-04-17 | Disposition: A | Discharge status: Home / Self Care | Payer: BC Managed Care – PPO | Source: Ambulatory Visit | Attending: Family Medicine | Admitting: Family Medicine

## 2019-04-17 VITALS — BP 120/78 | HR 83 | Ht 65.0 in | Wt 265.0 lb

## 2019-04-17 DIAGNOSIS — M19011 Primary osteoarthritis, right shoulder: Secondary | ICD-10-CM

## 2019-04-17 DIAGNOSIS — M545 Low back pain, unspecified: Secondary | ICD-10-CM

## 2019-04-17 DIAGNOSIS — M7552 Bursitis of left shoulder: Secondary | ICD-10-CM

## 2019-04-17 MED ORDER — BACLOFEN 10 MG PO TABS: 5.0000 mg | ORAL_TABLET | Freq: Two times a day (BID) | ORAL | 1 refills | Status: DC | PRN

## 2019-04-17 MED ORDER — TRIAMCINOLONE ACETONIDE 40 MG/ML IJ SUSP
40.0000 mg | Freq: Once | INTRAMUSCULAR | Status: AC
  Administered 2019-04-17: 40 mg via INTRA_ARTICULAR

## 2019-04-17 NOTE — Assessment & Plan Note (Signed)
Left shoulder seems more consistent with bursitis, impingement or rotator cuff.  Has good range of motion. -Could consider subacromial injection. -Counseled on home exercise therapy and supportive care

## 2019-04-17 NOTE — Assessment & Plan Note (Signed)
Right shoulder seems to have a mild effusion on ultrasound.  Does have some limitation with external rotation.  May have a frozen shoulder component to it. -Glenohumeral injection. -Counseled on home exercise therapy and supportive care.  -Is going for physical therapy next week. -Could consider Toradol injection.

## 2019-04-17 NOTE — Patient Instructions (Signed)
Good to see you Please try ice  Physical therapy can help with the shoulder and back  Please try the muscle relaxer. It can make you sleepy so start with it at night   I will call with the xray results.  Please send me a message in MyChart with any questions or updates.  Please see me back in 4-6 weeks.   --Dr. Raeford Razor

## 2019-04-17 NOTE — Progress Notes (Signed)
Christina [name] - 53 y.o. female MRN [id number]  Date of birth: 1965/10/06  SUBJECTIVE:  Including CC & ROS.  Chief Complaint  Patient presents with  . Follow-up    follow up for bilateral shoulder    Christina [name] is a 53 y.o. female that is presenting with acute worsening of right shoulder pain and ongoing low back pain.  The right shoulder is keeping her up at night.  It is moderate to severe.  Seems to be localized to the shoulder.  Limited improvement modalities today.  She feels it with any form of movement.  Can be sharp and stabbing.  She is also having significant lower back pain.  She is going to start physical therapy next week.  The pain is severe when she is transitioning from sitting to standing.  She has to lean over as the pain is so bad.  Denies any numbness or tingling.   Review of Systems  Constitutional: Negative for fever.  HENT: Negative for congestion.   Respiratory: Negative for cough.   Cardiovascular: Negative for chest pain.  Gastrointestinal: Negative for abdominal pain.  Musculoskeletal: Positive for arthralgias and back pain.  Skin: Negative for color change.  Neurological: Negative for weakness.  Hematological: Negative for adenopathy.    HISTORY: Past Medical, Surgical, Social, and Family History Reviewed & Updated per EMR.   Pertinent Historical Findings include:  Past Medical History:  Diagnosis Date  . Acute blood loss as cause of postoperative anemia   . Anemia 01/22/2019  . Chest pain    a. prior coronary CT without CAD.  Marland Kitchen Elevated sed rate 01/22/2019  . Essential hypertension 07/08/2016  . GERD (gastroesophageal reflux disease)   . PAD (peripheral artery disease) ([place])    a. 05/2018: PV angio subacute thrombotic occlusion of her popliteal artery. She underwent successful penumbra aspiration thrombectomy, PTA and self-expanding stenting using overlapping Tigris self-expanding stents of a long thrombotic occlusion of the popliteal, anterior tibial  and tibioperoneal trunk. Procedure c/b pseudoaneurysm/ABL anemia.  . 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[place])   . Uterine fibroid   . Vitamin D deficiency     Past Surgical History:  Procedure Laterality Date  . ABDOMINAL AORTOGRAM W/LOWER EXTREMITY Right 05/08/2018   Procedure: ABDOMINAL AORTOGRAM W/LOWER EXTREMITY;  Surgeon: Lorretta Harp, MD;  Location: [facility];  Service: Cardiovascular;  Laterality: Right;  . lump removed from breast at age 65    . PERIPHERAL VASCULAR INTERVENTION Right 05/08/2018   Procedure: PERIPHERAL VASCULAR INTERVENTION;  Surgeon: Lorretta Harp, MD;  Location: [facility];  Service: Cardiovascular;  Laterality: Right;  Anterior tibial and popliteal stents  . PERIPHERAL VASCULAR THROMBECTOMY Right 05/08/2018   Procedure: PERIPHERAL VASCULAR THROMBECTOMY;  Surgeon: Lorretta Harp, MD;  Location: [facility];  Service: Cardiovascular;  Laterality: Right;  Popliteal, tibioperoneal trunk, Anterior tibial, Posterior tibial    Allergies  Allergen Reactions  . Lisinopril Cough    Family History  Problem Relation Age of Onset  . Hypertension Mother   . Alzheimer's disease Father   . Healthy Son   . Colon polyps Neg Hx   . Crohn's disease Neg Hx   . Rectal cancer Neg Hx   . Stomach cancer Neg Hx   . Pancreatic cancer Neg Hx      Social History   Socioeconomic History  . Marital status: Single    Spouse name: Not on file  . Number of children: Not on file  . Years of  education: Not on file  . Highest education level: Not on file  Occupational History  . Not on file  Tobacco Use  . Smoking status: Never Smoker  . Smokeless tobacco: Never Used  Substance and Sexual Activity  . Alcohol use: No  . Drug use: No  . Sexual activity: Yes  Other Topics Concern  . Not on file  Social History Narrative  . Not on file   Social Determinants of Health   Financial Resource Strain:   . Difficulty of Paying Living Expenses: Not  on file  Food Insecurity:   . Worried About Charity fundraiser in the Last Year: Not on file  . Ran Out of Food in the Last Year: Not on file  Transportation Needs:   . Lack of Transportation (Medical): Not on file  . Lack of Transportation (Non-Medical): Not on file  Physical Activity:   . Days of Exercise per Week: Not on file  . Minutes of Exercise per Session: Not on file  Stress:   . Feeling of Stress : Not on file  Social Connections:   . Frequency of Communication with Friends and Family: Not on file  . Frequency of Social Gatherings with Friends and Family: Not on file  . Attends Religious Services: Not on file  . Active Member of Clubs or Organizations: Not on file  . Attends Archivist Meetings: Not on file  . Marital Status: Not on file  Intimate Partner Violence:   . Fear of Current or Ex-Partner: Not on file  . Emotionally Abused: Not on file  . Physically Abused: Not on file  . Sexually Abused: Not on file     PHYSICAL EXAM:  VS: BP 120/78   Pulse 83   Ht 5\' 5"  (1.651 m)   Wt 265 lb (120.2 kg)   BMI 44.10 kg/m  Physical Exam Gen: NAD, alert, cooperative with exam, well-appearing ENT: normal lips, normal nasal mucosa,  Eye: normal EOM, normal conjunctiva and lids CV:  no edema, +2 pedal pulses   Resp: no accessory muscle use, non-labored,  Skin: no rashes, no areas of induration  Neuro: normal tone, normal sensation to touch Psych:  normal insight, alert and oriented MSK:  Right shoulder:  Limited ER  Normal strength resistance with internal and external rotation. Pain with external rotation and abduction. No pain with empty can testing. Back: Pain with flexion and extension. Negative straight leg raise. Normal strength resistance with hip flexion, knee flexion and extension. Neurovascularly intact   Aspiration/Injection Procedure Note Christina [name] May 21, 1965  Procedure: Injection Indications: Right shoulder pain  Procedure  Details Consent: Risks of procedure as well as the alternatives and risks of each were explained to the (patient/caregiver).  Consent for procedure obtained. Time Out: Verified patient identification, verified procedure, site/side was marked, verified correct patient position, special equipment/implants available, medications/allergies/relevent history reviewed, required imaging and test results available.  Performed.  The area was cleaned with iodine and alcohol swabs.    The right glenohumeral joint was injected using 3 cc of 1% lidocaine on a 22-gauge 2 inch needle to anesthetize the skin on the tract.  A mixture containing 1 cc's of 40 mg Kenalog and 4 cc's of 0.25% bupivacaine was injected.  Ultrasound was used. Images were obtained in short views showing the injection.     A sterile dressing was applied.  Patient did tolerate procedure well.     ASSESSMENT & PLAN:   OA (osteoarthritis) of shoulder Right  shoulder seems to have a mild effusion on ultrasound.  Does have some limitation with external rotation.  May have a frozen shoulder component to it. -Glenohumeral injection. -Counseled on home exercise therapy and supportive care.  -Is going for physical therapy next week. -Could consider Toradol injection.  Subacromial bursitis of left shoulder joint Left shoulder seems more consistent with bursitis, impingement or rotator cuff.  Has good range of motion. -Could consider subacromial injection. -Counseled on home exercise therapy and supportive care  Acute bilateral low back pain without sciatica Pain is ongoing in the lower back.  Seems more spasm related with transitioning from sitting to standing.  No radicular symptoms. -Has physical therapy. -Future order for x-ray in place. -Baclofen.

## 2019-04-17 NOTE — Assessment & Plan Note (Signed)
Pain is ongoing in the lower back.  Seems more spasm related with transitioning from sitting to standing.  No radicular symptoms. -Has physical therapy. -Future order for x-ray in place. -Baclofen.

## 2019-04-18 ENCOUNTER — Ambulatory Visit: Payer: BC Managed Care – PPO | Admitting: Physical Therapy

## 2019-04-18 ENCOUNTER — Telehealth: Payer: Self-pay | Admitting: Family Medicine

## 2019-04-18 NOTE — Telephone Encounter (Signed)
Informed of xray results.   Rosemarie Ax, MD [facility] 04/18/2019, 2:24 PM

## 2019-04-23 ENCOUNTER — Ambulatory Visit: Payer: BC Managed Care – PPO | Admitting: Physical Therapy

## 2019-04-25 ENCOUNTER — Encounter: Payer: Self-pay | Admitting: Family Medicine

## 2019-04-25 ENCOUNTER — Other Ambulatory Visit: Payer: Self-pay | Admitting: Family Medicine

## 2019-04-26 ENCOUNTER — Other Ambulatory Visit: Payer: Self-pay

## 2019-04-26 DIAGNOSIS — R7303 Prediabetes: Secondary | ICD-10-CM

## 2019-04-26 MED ORDER — POTASSIUM CHLORIDE CRYS ER 10 MEQ PO TBCR: 10.0000 meq | EXTENDED_RELEASE_TABLET | ORAL | 0 refills | Status: DC

## 2019-05-01 ENCOUNTER — Ambulatory Visit: Payer: BC Managed Care – PPO | Admitting: Family Medicine

## 2019-05-03 ENCOUNTER — Ambulatory Visit: Payer: BC Managed Care – PPO | Attending: Family Medicine | Admitting: Physical Therapy

## 2019-05-03 ENCOUNTER — Encounter: Payer: Self-pay | Admitting: Physical Therapy

## 2019-05-03 ENCOUNTER — Other Ambulatory Visit: Payer: Self-pay

## 2019-05-03 DIAGNOSIS — M25512 Pain in left shoulder: Secondary | ICD-10-CM | POA: Diagnosis not present

## 2019-05-03 DIAGNOSIS — M545 Low back pain, unspecified: Secondary | ICD-10-CM

## 2019-05-03 DIAGNOSIS — M25611 Stiffness of right shoulder, not elsewhere classified: Secondary | ICD-10-CM | POA: Diagnosis not present

## 2019-05-03 DIAGNOSIS — G8929 Other chronic pain: Secondary | ICD-10-CM | POA: Diagnosis not present

## 2019-05-03 DIAGNOSIS — M25511 Pain in right shoulder: Secondary | ICD-10-CM | POA: Insufficient documentation

## 2019-05-03 DIAGNOSIS — R262 Difficulty in walking, not elsewhere classified: Secondary | ICD-10-CM | POA: Insufficient documentation

## 2019-05-03 NOTE — Therapy (Addendum)
[facility] [address]  [place], [place], [address] Phone: [phone]   Fax:  [phone]  Physical Therapy Evaluation  Patient Details  Name: Christina [name] MRN: [id number] Date of Birth: 29-Jan-1966 Referring Provider (PT): Clearance Coots, MD   Encounter Date: 05/03/2019  PT End of Session - 05/03/19 1216    Visit Number  1    Number of Visits  7    Date for PT Re-Evaluation  06/14/19    Authorization Type  [employer]    Authorization - Visit Number  1    Authorization - Number of Visits  30    PT Start Time  0801    PT Stop Time  0900   moist heat   PT Time Calculation (min)  59 min    Activity Tolerance  Patient tolerated treatment well;Patient limited by pain    Behavior During Therapy  [facility] for tasks assessed/performed       Past Medical History:  Diagnosis Date  . Acute blood loss as cause of postoperative anemia   . Anemia 01/22/2019  . Chest pain    a. prior coronary CT without CAD.  Marland Kitchen Elevated sed rate 01/22/2019  . Essential hypertension 07/08/2016  . GERD (gastroesophageal reflux disease)   . PAD (peripheral artery disease) ([place])    a. 05/2018: PV angio subacute thrombotic occlusion of her popliteal artery. She underwent successful penumbra aspiration thrombectomy, PTA and self-expanding stenting using overlapping Tigris self-expanding stents of a long thrombotic occlusion of the popliteal, anterior tibial and tibioperoneal trunk. Procedure c/b pseudoaneurysm/ABL anemia.  . 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[place])   . Uterine fibroid   . Vitamin D deficiency     Past Surgical History:  Procedure Laterality Date  . ABDOMINAL AORTOGRAM W/LOWER EXTREMITY Right 05/08/2018   Procedure: ABDOMINAL AORTOGRAM W/LOWER EXTREMITY;  Surgeon: Lorretta Harp, MD;  Location: [facility];  Service: Cardiovascular;  Laterality: Right;  . lump removed from breast at age 16    . PERIPHERAL VASCULAR INTERVENTION  Right 05/08/2018   Procedure: PERIPHERAL VASCULAR INTERVENTION;  Surgeon: Lorretta Harp, MD;  Location: [facility];  Service: Cardiovascular;  Laterality: Right;  Anterior tibial and popliteal stents  . PERIPHERAL VASCULAR THROMBECTOMY Right 05/08/2018   Procedure: PERIPHERAL VASCULAR THROMBECTOMY;  Surgeon: Lorretta Harp, MD;  Location: [facility];  Service: Cardiovascular;  Laterality: Right;  Popliteal, tibioperoneal trunk, Anterior tibial, Posterior tibial    There were no vitals filed for this visit.   Subjective Assessment - 05/03/19 0802    Subjective  Patient reports LBP started 2 months ago with progressive worsening. Pain is located over B sides of back. Worse with prolonged standing and sitting, better with leaning forward. B shoulder pain started 3-4 months ago. No inciting trauma. B shoulder pain is "in the joint" and is diffuse and nonspecific- worse on R. Notes increased popping in the joint with movement. Worse when typing all day at work, R/L sidelying. Better with Tylenol. Reporting N/T in L hand and B toes intermittently. Received injection to R shoulder which did give her some relief.    Pertinent History  pseudoaneurysm following procedure, PAD, GERD, HTN, chest pain, anemia, R peripheral vascular thrombectomy 05/08/2018    Limitations  Sitting;Lifting;Standing;Walking;House hold activities    How long can you sit comfortably?  1-2 hours    How long can you stand comfortably?  15-20 min    How long can you walk  comfortably?  30 min    Diagnostic tests  03/22/19 R shoulder xray: Degenerative changes of the [facility] joint and the [place] joint; 03/22/19 L shoulder xray: Mild degenerative changes of the [facility] joint. 04/17/19 lumbar xray: Mild scoliosis and degenerative disc disease; Right-sided transitional type vertebra at the L5 level    Patient Stated Goals  "no more pain"    Currently in Pain?  Yes    Pain Score  0-No pain    Pain Location  Back    Pain Orientation   Left;Right;Lower    Pain Descriptors / Indicators  Aching    Pain Type  Acute pain    Multiple Pain Sites  Yes    Pain Score  0    Pain Location  Shoulder    Pain Orientation  Right;Left    Pain Descriptors / Indicators  Dull;Sore    Pain Type  Chronic pain         [facility] PT Assessment - 05/03/19 0812      Assessment   Medical Diagnosis  Acute B LBP without sciatica, acute pain of B shoulders    Referring Provider (PT)  Clearance Coots, MD    Onset Date/Surgical Date  04/05/19    Hand Dominance  Right    Next MD Visit  05/29/19    Prior Therapy  no      Precautions   Precautions  None      Balance Screen   Has the patient fallen in the past 6 months  No    Has the patient had a decrease in activity level because of a fear of falling?   No    Is the patient reluctant to leave their home because of a fear of falling?   No      Home Social worker  Private residence    Living Arrangements  Alone    Available Help at Discharge  Family    Type of [place] to enter    Entrance Stairs-Number of Steps  8    Entrance Stairs-Rails  Left    Home Layout  Two level    Alternate Level Stairs-Number of Steps  10    Alternate Level Stairs-Rails  Left    Home Equipment  None      Prior Function   Level of Independence  Independent    Vocation  Full time employment    Vocation Requirements  desk/computer work    Leisure  playing with grandchildren      Cognition   Overall Cognitive Status  Within Functional Limits for tasks assessed      Sensation   Light Touch  Appears Intact      Coordination   Gross Motor Movements are Fluid and Coordinated  Yes      Posture/Postural Control   Posture/Postural Control  Postural limitations    Postural Limitations  Rounded Shoulders    Posture Comments  R convex lumbar curve      ROM / Strength   AROM / PROM / Strength  AROM;Strength      AROM   AROM Assessment Site  Lumbar;Shoulder     Right/Left Shoulder  Right;Left    Right Shoulder Flexion  105 Degrees   moderate pain   Right Shoulder ABduction  94 Degrees   moderate pain   Right Shoulder Internal Rotation  --   FIR L1    Right Shoulder External  Rotation  --   FER back of head w/ mild pain   Left Shoulder Flexion  130 Degrees    Left Shoulder ABduction  167 Degrees    Left Shoulder Internal Rotation  --   FIR T7   Left Shoulder External Rotation  --   FER T4   Lumbar Flexion  toes   report of a pop   Lumbar Extension  WNL   mild pain B LB   Lumbar - Right Side Bend  jt line   mild R LBP   Lumbar - Left Side Bend  distal thigh    Lumbar - Right Rotation  WNL    Lumbar - Left Rotation  WNL      Strength   Strength Assessment Site  Shoulder;Hip;Knee;Ankle    Right/Left Shoulder  Right;Left    Right Shoulder Flexion  4+/5   pain   Right Shoulder ABduction  4/5   pain   Right Shoulder Internal Rotation  4/5   pain   Right Shoulder External Rotation  4/5   pain   Left Shoulder Flexion  4+/5    Left Shoulder ABduction  4/5    Left Shoulder Internal Rotation  4+/5    Left Shoulder External Rotation  4+/5    Right/Left Hip  Right;Left    Right Hip Flexion  4+/5    Right Hip ABduction  4+/5    Right Hip ADduction  4+/5    Left Hip Flexion  4+/5    Left Hip ABduction  4+/5    Left Hip ADduction  4+/5    Right/Left Knee  Right;Left    Right Knee Flexion  4/5    Right Knee Extension  4+/5    Left Knee Flexion  4+/5    Left Knee Extension  4+/5    Right/Left Ankle  Right;Left    Right Ankle Dorsiflexion  4/5    Right Ankle Plantar Flexion  4/5    Left Ankle Dorsiflexion  4/5    Left Ankle Plantar Flexion  4+/5      Palpation   Palpation comment  TTP and increased soft tissue restriction over B UT, B infra/teres group, L proximal biceps tendon and muscle belly, R pec; TTP over R PSIS, B lumbar paraspinals, R QL      Ambulation/Gait   Assistive device  None    Gait Pattern  Step-through  pattern;Lateral trunk lean to right;Lateral hip instability    Ambulation Surface  Level;Indoor                Objective measurements completed on examination: See above findings.      [facility] Adult PT Treatment/Exercise - 05/03/19 0388      Modalities   Modalities  Moist Heat      Moist Heat Therapy   Number Minutes Moist Heat  10 Minutes    Moist Heat Location  Lumbar Spine             PT Education - 05/03/19 1216    Education Details  prognosis, POC, HEP    Person(s) Educated  Patient    Methods  Explanation;Demonstration;Tactile cues;Verbal cues;Handout    Comprehension  Verbalized understanding;Returned demonstration       PT Short Term Goals - 05/03/19 1222      PT SHORT TERM GOAL #1   Title  Patient to be independent with intial HEP.    Time  3    Period  Weeks    Status  New    Target Date  05/24/19        PT Long Term Goals - 05/03/19 1222      PT LONG TERM GOAL #1   Title  Patient to be independent with advanced HEP.    Time  6    Period  Weeks    Status  New    Target Date  06/14/19      PT LONG TERM GOAL #2   Title  Patient to demonstrate R shoulder AROM WNL and without pain limiting.    Time  6    Period  Weeks    Status  New    Target Date  06/14/19      PT LONG TERM GOAL #3   Title  Patient to demonstrate B shoulder strength >/=4+/5.    Time  6    Period  Weeks    Status  New    Target Date  06/14/19      PT LONG TERM GOAL #4   Title  Patient to report tolerance of a full work day without pain >2/10.    Time  6    Period  Weeks    Status  New    Target Date  06/14/19      PT LONG TERM GOAL #5   Title  Patient demosntrate lumbar AROM WNL and without pain limiting.    Time  6    Period  Weeks    Status  New    Target Date  06/14/19             Plan - 05/03/19 1217    Clinical Impression Statement  Patient is a 53y/o F presenting to OPPT with c/o LBP and B shoulder pain of 2 and 3-4 months duration,  respectively. Back pain occurs across LB and is worse with prolonged standing and sitting. Better with flexion. Shoulder pain is diffuse and worse on R. Does note intermittent "popping." Worse with typing at work and sidelying. Patient today presenting with rounded shoulders and lumbar scoliotic curve, limited R shoulder ROM, painful lumbar ROM, B shoulder weakness, B ankle weakness, and gait deviations. Patient educated on gentle stretching HEP- patient reported understanding. Patient reported LBP at end of assessment, thus this was addressed with moist heat to LBP. Patient without complaints at end of session. Would benefit from skilled PT services 1x/week for 6 weeks to address aforementioned impairments.    Personal Factors and Comorbidities  Age;Comorbidity 3+;Time since onset of injury/illness/exacerbation;Past/Current Experience;Profession    Comorbidities  pseudoaneurysm following procedure, PAD, GERD, HTN, chest pain, anemia, R peripheral vascular thrombectomy 05/08/2018    Examination-Activity Limitations  Sit;Bed Mobility;Sleep;Bend;Stand;Dressing;Hygiene/Grooming;Lift;Reach Overhead    Examination-Participation Restrictions  Church;Cleaning;Shop;Community Activity;Driving;Interpersonal Relationship;Laundry;Meal Prep    Stability/Clinical Decision Making  Stable/Uncomplicated    Clinical Decision Making  Low    Rehab Potential  Good    PT Frequency  1x / week    PT Duration  6 weeks    PT Treatment/Interventions  ADLs/Self Care Home Management;Cryotherapy;Electrical Stimulation;Iontophoresis 8m/ml Dexamethasone;Moist Heat;Balance training;Therapeutic exercise;Therapeutic activities;Functional mobility training;Stair training;Gait training;Ultrasound;Neuromuscular re-education;Patient/family education;Manual techniques;Vasopneumatic Device;Taping;Energy conservation;Dry needling;Passive range of motion    PT Next Visit Plan  reassess HEP    Consulted and Agree with Plan of Care  Patient        Patient will benefit from skilled therapeutic intervention in order to improve the following deficits and impairments:  Decreased activity tolerance, Decreased strength, Pain, Increased fascial restricitons, Impaired UE functional use, Difficulty walking,  Increased muscle spasms, Improper body mechanics, Decreased range of motion, Impaired flexibility, Postural dysfunction  Visit Diagnosis: Chronic right shoulder pain  Stiffness of right shoulder, not elsewhere classified  Chronic left shoulder pain  Acute bilateral low back pain without sciatica  Difficulty in walking, not elsewhere classified     Problem List Patient Active Problem List   Diagnosis Date Noted  . Subacromial bursitis of left shoulder joint 04/17/2019  . Acute bilateral low back pain without sciatica 03/22/2019  . OA (osteoarthritis) of shoulder 03/22/2019  . Popping sound of knee joint 03/22/2019  . Stress 03/09/2019  . MGUS (monoclonal gammopathy of unknown significance) 02/26/2019  . Elevated sed rate 01/22/2019  . Anemia 01/22/2019  . Pain in both upper extremities 01/22/2019  . Lightheaded 10/20/2018  . 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[place])   . Acute blood loss as cause of postoperative anemia   . Critical limb ischemia with history of revascularization of same extremity 05/08/2018  . Peripheral arterial disease ([place]) 04/28/2018  . Obesity 07/08/2016  . Essential hypertension 07/08/2016  . Pre-diabetes 06/24/2016  . Uterine fibroid   . Vitamin D deficiency   . Atypical chest pain      Janene Harvey, PT, DPT 05/03/19 12:26 PM   [facility] [address]  [place], [place], [address] Phone: [phone]   Fax:  [phone]  Name: Alianah Lofton MRN: [id number] Date of Birth: February 20, 1966   PHYSICAL THERAPY DISCHARGE SUMMARY  Visits from Start of Care: 1  Current functional level related to goals / functional  outcomes: Unable to assess; patient did not return after initial eval   Remaining deficits: See above   Education / Equipment: HEP  Plan: Patient agrees to discharge.  Patient goals were not met. Patient is being discharged due to not returning since the last visit.  ?????     Janene Harvey, PT, DPT 06/06/19 2:25 PM

## 2019-05-08 DIAGNOSIS — R5383 Other fatigue: Secondary | ICD-10-CM | POA: Diagnosis not present

## 2019-05-08 DIAGNOSIS — E7800 Pure hypercholesterolemia, unspecified: Secondary | ICD-10-CM | POA: Diagnosis not present

## 2019-05-08 DIAGNOSIS — I10 Essential (primary) hypertension: Secondary | ICD-10-CM | POA: Diagnosis not present

## 2019-05-08 DIAGNOSIS — Z03818 Encounter for observation for suspected exposure to other biological agents ruled out: Secondary | ICD-10-CM | POA: Diagnosis not present

## 2019-05-08 DIAGNOSIS — E559 Vitamin D deficiency, unspecified: Secondary | ICD-10-CM | POA: Diagnosis not present

## 2019-05-08 DIAGNOSIS — R7303 Prediabetes: Secondary | ICD-10-CM | POA: Diagnosis not present

## 2019-05-08 DIAGNOSIS — D472 Monoclonal gammopathy: Secondary | ICD-10-CM | POA: Diagnosis not present

## 2019-05-08 DIAGNOSIS — Z79899 Other long term (current) drug therapy: Secondary | ICD-10-CM | POA: Diagnosis not present

## 2019-05-08 DIAGNOSIS — I517 Cardiomegaly: Secondary | ICD-10-CM | POA: Diagnosis not present

## 2019-05-08 DIAGNOSIS — R072 Precordial pain: Secondary | ICD-10-CM | POA: Diagnosis not present

## 2019-05-08 DIAGNOSIS — Z1159 Encounter for screening for other viral diseases: Secondary | ICD-10-CM | POA: Diagnosis not present

## 2019-05-09 ENCOUNTER — Telehealth: Payer: Self-pay | Admitting: Rheumatology

## 2019-05-09 NOTE — Telephone Encounter (Signed)
Patient diagnosed by Dr. Tish Men on 03/02/2019 with MGUS. Please advise.

## 2019-05-09 NOTE — Telephone Encounter (Signed)
Patient canceled appointment for Monday 05/14/19 because she had a workup with the Cancer doctor, and diagnosis back in November. Patient wanted to know if she needs to follow up here, and when. Please call to advise.

## 2019-05-10 ENCOUNTER — Telehealth: Payer: Self-pay

## 2019-05-10 NOTE — Telephone Encounter (Signed)
I spoke to the patient who called because over the last month she has woken up a few times with CP, starting in the middle of the chest, radiating to her jaw and SOB. Her BP has been fine 128/78, but is very concerned with the radiating of this CP.  I have scheduled her with Dr Acie Fredrickson on 1/18 for f/u.  I advised her to go to the ED, if symptoms worsen.  She verbalized understanding.

## 2019-05-10 NOTE — Telephone Encounter (Signed)
Advised patient She does not need a follow-up visit. Patient verbalized understanding.

## 2019-05-10 NOTE — Telephone Encounter (Signed)
She does not need a follow-up visit.

## 2019-05-14 ENCOUNTER — Ambulatory Visit: Payer: BC Managed Care – PPO | Admitting: Rheumatology

## 2019-05-15 ENCOUNTER — Ambulatory Visit: Payer: BC Managed Care – PPO | Admitting: Physical Therapy

## 2019-05-16 DIAGNOSIS — I739 Peripheral vascular disease, unspecified: Secondary | ICD-10-CM | POA: Diagnosis not present

## 2019-05-16 DIAGNOSIS — D472 Monoclonal gammopathy: Secondary | ICD-10-CM | POA: Diagnosis not present

## 2019-05-16 DIAGNOSIS — R233 Spontaneous ecchymoses: Secondary | ICD-10-CM | POA: Diagnosis not present

## 2019-05-16 DIAGNOSIS — R0789 Other chest pain: Secondary | ICD-10-CM | POA: Diagnosis not present

## 2019-05-17 NOTE — Progress Notes (Signed)
Virtual Visit via Video Note  I connected with Christina [name] on 05/21/19 at 10:30 AM EST by a video enabled telemedicine application and verified that I am speaking with the correct person using two identifiers.  Location: Patient: Home  Provider: Clinic    This service was conducted via virtual visit.  Both audio and visual tools were used.  The patient was located at home. I was located in my office.  Consent was obtained prior to the virtual visit and is aware of possible charges through their insurance for this visit.  The patient is an established patient.  Dr. Estanislado Pandy, MD conducted the virtual visit and [name], PA-C acted as scribe during the service.  Office staff helped with scheduling follow up visits after the service was conducted.   I discussed the limitations of evaluation and management by telemedicine and the availability of in person appointments. The patient expressed understanding and agreed to proceed.  CC: Numbness in both hands and feet  History of Present Illness: Patient is a 54 year old female with a past medical history of Positive ANA, DDD, and osteoarthritis. She experiencing intermittent nocturnal paresthesias in both hands and both feet. She has also been experiencing frequent muscle cramps.  She reports she went through a thorough work up for abnormal SPEP.  She was diagnosed with MGUS and will have lab work drawn every 4 months.   Review of Systems  Constitutional: Negative for fever and malaise/fatigue.  HENT: Negative for congestion.   Eyes: Negative for photophobia, pain, discharge and redness.  Respiratory: Positive for shortness of breath. Negative for cough and wheezing.   Cardiovascular: Negative for chest pain and palpitations.  Gastrointestinal: Negative for blood in stool, constipation and diarrhea.  Genitourinary: Negative for dysuria and urgency.  Musculoskeletal: Positive for joint pain. Negative for back pain, myalgias and neck pain.  Skin:  Negative for rash.  Neurological: Negative for dizziness, weakness and headaches.       +Numbness in both hands and both feet  Psychiatric/Behavioral: Negative for depression and memory loss. The patient is not nervous/anxious and does not have insomnia.       Observations/Objective: Physical Exam  Constitutional: She is oriented to person, place, and time and well-developed, well-nourished, and in no distress.  HENT:  Head: Normocephalic and atraumatic.  Eyes: Conjunctivae are normal.  Pulmonary/Chest: Effort normal.  Neurological: She is alert and oriented to person, place, and time.  Psychiatric: Mood, memory, affect and judgment normal.   Patient reports morning stiffness for 0 NONE.   Patient REPORTS nocturnal pain.  Difficulty dressing/grooming: Denies Difficulty climbing stairs: Reports Difficulty getting out of chair: Denies Difficulty using hands for taps, buttons, cutlery, and/or writing: Reports   Assessment and Plan: Visit Diagnoses: Positive ANA (antinuclear antibody) - AVISE negative, ANA 1: 80 speckled low titer, ENA negative, CB CAP negative.  Her ANA titer is low which is not significant.  Patient has no clinical features of autoimmune disease. She would like to repeat AVISE lab work.  We will mail these lab orders and recommending repeating them in April 2021 (6 months after initial AVISE labs). She was advised to notify us if she develops any new or worsening symptoms.  She will follow up in 4 months.   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   MGUS (monoclonal gammopathy of unknown significance): She underwent a thorough workup with hem/onc and was diagnosed with MGUS.  She will be having routine lab work  every 4 months.  Numbness and tingling of upper and [name] extremities of both sides: She has been experiencing intermittent paresthesias in both upper and [name] extremities.  Her symptoms are only at night.  We will refer her  to neurology in [facility] for further evaluation.   Primary osteoarthritis of right knee-She is not having any right knee joint pain or inflammation.  DDD (degenerative disc disease), cervical-She has no neck pain or stiffness.  No symptoms of radiculopathy.   Other medical conditions are listed as follows:   Critical limb ischemia with history of revascularization of same extremity  Peripheral arterial disease ([place])  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[place])  Vitamin D deficiency  Essential hypertension  Pre-diabetes  Follow Up Instructions: She will follow up in 4 months    I discussed the assessment and treatment plan with the patient. The patient was provided an opportunity to ask questions and all were answered. The patient agreed with the plan and demonstrated an understanding of the instructions.   The patient was advised to call back or seek an in-person evaluation if the symptoms worsen or if the condition fails to improve as anticipated.  I provided 25 minutes of non-face-to-face time during this encounter.   Bo Merino, MD    Scribed by-  [name], PA-C

## 2019-05-20 ENCOUNTER — Encounter: Payer: Self-pay | Admitting: Cardiovascular Disease

## 2019-05-20 NOTE — Progress Notes (Signed)
Cardiology Office Note   Date:  05/21/2019   ID:  Christina [name], DOB Sep 21, 1965, MRN [id number]  PCP:  Libby Maw, MD  Cardiologist:   Mertie Moores, MD   Chief Complaint  Patient presents with  . Chest Pain  . PAD   Problem List 1. Chest pain  2. HTN     Christina [name] is a 54 y.o. female who is being seen today for the evaluation of chest pain  at the request of Libby Maw,*.  Has been having CP for the past year.  Tightness.  Lasts for a minute or so.  Not related to exertion, eating , drinking, change of position. Not pleuretic.    Resolves spontaneously after several minutes  Hx of HTN - was on Lisinopril but this caused a cough.   Makes no effort to avoid salt .  Discussed salty foods at length  Does not exercise  Works at [employer]  These episodes have been going in for several years     Has palpitations  - clinically sounds like PVCs  Oct. 9, 2018:  Christina [name] is seen back today for followup of recurrent CP She was seen about a year ago for CP .  Was thought to be atypical . She was seen at her primary MD and has continued to have CP.    Dr. Edilia Bo sent me a message requesting that we see Christina [name] again for continued CP .  Still having cp .  The last episode started while she was at a meeting at work .  They last several minutes,  Starts mid sternal , dull pain . Gets stronger. Radiates up to her jaw,  Stops at her gums. Radiates through to her back  Slowly supsides and goes away .  May last for 10 min. Is not exercising No diaphoresis, no presyncope Does not feel like refulx  .     March 08, 2018: Christina [name] is seen today for follow-up of her chest pain.  Her chest pain has been rather atypical.  She had an upper endoscopy in August, 2019 which was basically unremarkable.  Coronary CT angiogram in Nov. 2018 was normal .    Wt = 285 lbs    S he eats a very poor diet.  She drinks 3 quarts of sugary soda every day.   She also drinks sweet tea and sugary Kool-Aid like drinks.  She eats a candy bar every night before going to bed.  She eats out for lunch on a daily basis  Oct. 5, 2020 :  Christina [name] is seen back for follow up visit Wt. Is 270 lbs.   She developed symptoms of pericarditis.  We started by telemedicine visit in July, 2020.  We predcribed  colchicine at that time but she did not take it  She has dramatically changed her diet.  She is no longer drinking all the soda.  She is also been exercising.  She has been losing weight.  Had a PV procedure Jan. 2020  - has 2 stents in her right leg  She had aspiration thrombectomy and placement of 2 long stents.  She is been maintained on Eliquis and Plavix since that time.  She is 54 years old and has not had her first colonoscopy.  We need to give some thought to holding her Plavix and Eliquis temporarily in order for her to have a colonoscopy.  I will defer to Dr. Gwenlyn Found on this issue.  Jan. 18, 2021  Christina [name] is seen today for recurrent chest pain when awakening in the am She has a coronary calcium score of 0 Coronary Ct angiogram shows no CAD Her pulmonary artery was found to be dilated suggestive of pulmonary HTN.  Echo in July , 2020 showed normal LV systolic function, grade 1 diastolic dysfunction. There was normal RV function Peak RV/RA gradient was 21 mm Hg.   She has been waking up with cp for the past several weeks. Starts in the middle of her back and radiate to her chest  Last for 10 min.  , no associated belching .  Has some dyspnea.   Hard chest pain   Eats dinner at home by 5:30 -6 and she is in bed by 6.   Wakes up at 4:30 or 5.  She goes to bed with basically full stomach.  Some of her symptoms might be GI in origin.   Past Medical History:  Diagnosis Date  . Acute blood loss as cause of postoperative anemia   . Anemia 01/22/2019  . Chest pain    a. prior coronary CT without CAD.  Marland Kitchen Elevated sed rate 01/22/2019  . Essential hypertension  07/08/2016  . GERD (gastroesophageal reflux disease)   . MGUS (monoclonal gammopathy of unknown significance)   . PAD (peripheral artery disease) ([place])    a. 05/2018: PV angio subacute thrombotic occlusion of her popliteal artery. She underwent successful penumbra aspiration thrombectomy, PTA and self-expanding stenting using overlapping [place] self-expanding stents of a long thrombotic occlusion of the popliteal, anterior tibial and tibioperoneal trunk. Procedure c/b pseudoaneurysm/ABL anemia.  . 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[place])   . Uterine fibroid   . Vitamin D deficiency     Past Surgical History:  Procedure Laterality Date  . ABDOMINAL AORTOGRAM W/LOWER EXTREMITY Right 05/08/2018   Procedure: ABDOMINAL AORTOGRAM W/LOWER EXTREMITY;  Surgeon: Lorretta Harp, MD;  Location: [facility];  Service: Cardiovascular;  Laterality: Right;  . lump removed from breast at age 81    . PERIPHERAL VASCULAR INTERVENTION Right 05/08/2018   Procedure: PERIPHERAL VASCULAR INTERVENTION;  Surgeon: Lorretta Harp, MD;  Location: [facility];  Service: Cardiovascular;  Laterality: Right;  Anterior tibial and popliteal stents  . PERIPHERAL VASCULAR THROMBECTOMY Right 05/08/2018   Procedure: PERIPHERAL VASCULAR THROMBECTOMY;  Surgeon: Lorretta Harp, MD;  Location: [facility];  Service: Cardiovascular;  Laterality: Right;  Popliteal, tibioperoneal trunk, Anterior tibial, Posterior tibial     Current Outpatient Medications  Medication Sig Dispense Refill  . apixaban (ELIQUIS) 5 MG TABS tablet Take 1 tablet (5 mg total) by mouth 2 (two) times daily. 60 tablet 6  . atorvastatin (LIPITOR) 80 MG tablet Take 1 tablet (80 mg total) by mouth daily. 90 tablet 2  . carvedilol (COREG) 6.25 MG tablet Take 1 tablet (6.25 mg total) by mouth 2 (two) times daily. 180 tablet 3  . clopidogrel (PLAVIX) 75 MG tablet Take 1 tablet (75 mg total) by mouth daily. 90 tablet 3  . ergocalciferol (VITAMIN  D2) 1.25 MG (50000 UT) capsule Take by mouth.    . hydrochlorothiazide (MICROZIDE) 12.5 MG capsule Take 1 capsule (12.5 mg total) by mouth daily. 90 capsule 3  . megestrol (MEGACE) 40 MG tablet Take 1 tablet (40 mg total) by mouth 2 (two) times daily. 180 tablet 3  . potassium chloride (KLOR-CON) 10 MEQ tablet Take 1 tablet (10 mEq total) by mouth 3 (three) times a week. 90 tablet 0  . pregabalin (LYRICA)  75 MG capsule     . valsartan (DIOVAN) 160 MG tablet Take 1 tablet (160 mg total) by mouth daily. TO REPLACE LOSARTAN 90 tablet 3  . vitamin B-12 (CYANOCOBALAMIN) 1000 MCG tablet Take 1,000 mcg by mouth daily.     Current Facility-Administered Medications  Medication Dose Route Frequency Provider Last Rate Last Admin  . sodium chloride flush (NS) 0.9 % injection 3 mL  3 mL Intravenous Q12H Lorretta Harp, MD        PAD Screen 04/28/2018 09/06/2016  Previous PAD dx? No No  Previous surgical procedure? No No  Pain with walking? Yes Yes  Subsides with rest? Yes Yes  Feet/toe relief with dangling? No Yes  Painful, non-healing ulcers? No No  Extremities discolored? No No      Allergies:   Lisinopril    Social History:  The patient  reports that she has never smoked. She has never used smokeless tobacco. She reports that she does not drink alcohol or use drugs.   Family History:  The patient's family history includes Alzheimer's disease in her father; Healthy in her son; Hypertension in her mother.    ROS:  Please see the history of present illness.    Physical Exam: Blood pressure 138/72, pulse 89, height 5\' 5"  (1.651 m), weight 273 lb (123.8 kg), SpO2 98 %.  GEN:  Middle age, morbidly obese female,    HEENT: Normal NECK: No JVD; No carotid bruits LYMPHATICS: No lymphadenopathy CARDIAC: RRR , no murmurs, rubs, gallops RESPIRATORY:  Clear to auscultation without rales, wheezing or rhonchi  ABDOMEN: Soft, non-tender, non-distended MUSCULOSKELETAL:  No edema; No deformity   SKIN: Warm and dry NEUROLOGIC:  Alert and oriented x 3   EKG:      Recent Labs: 02/05/2019: TSH 1.070 03/02/2019: ALT 15 03/28/2019: Hemoglobin 13.0; Platelets 229 05/21/2019: BUN 10; Creatinine, Ser 0.98; Potassium 4.2; Sodium 138    Lipid Panel    Component Value Date/Time   CHOL 119 09/05/2018 0809   TRIG 106 09/05/2018 0809   HDL 43 09/05/2018 0809   CHOLHDL 2.8 09/05/2018 0809   CHOLHDL 3.1 05/02/2017 1021   VLDL 18.6 06/23/2016 1516   LDLCALC 55 09/05/2018 0809   LDLCALC 99 05/02/2017 1021      Wt Readings from Last 3 Encounters:  05/21/19 273 lb (123.8 kg)  04/17/19 265 lb (120.2 kg)  04/10/19 269 lb (122 kg)      Other studies Reviewed: Additional studies/ records that were reviewed today include: . Review of the above records demonstrates:    ASSESSMENT AND PLAN:  1.  Chest pain:   Had a normal Coronary CT angio showed no CAD ( 03/04/17). Coronary calcium score is 0   She was found to have a dilated pulmonary artery (36 mm -  40 mm) suggestive of pulmonary HTN.  No evidence of pulmonary emboli .   Will do a CT angio of the chest - PE protocol to further explore her dilated pulmonary arteries.  We discussed the possibility of sleep apnea.  She wakes up rested and does not have daytime sleepiness.  She has not been told that she snores or stops breathing.  At this point it does not appear that she has sleep apnea but I would have a low threshold to order a sleep study if we find other evidence of possible sleep apnea.  Some of her pain may be related to a GI etiology.  Encouraged her to take a Pepcid complete every night  before going to bed.  2.  Hypertension:  BP is well controlled. .  3.  Morbid obesity:    Advised weight loss  4.  PAD :  Further plans per Dr. Gwenlyn Found      Current medicines are reviewed at length with the patient today.  The patient does not have concerns regarding medicines.  Labs/ tests ordered today include:   Orders Placed This  Encounter  Procedures  . CT ANGIO CHEST PE W OR WO CONTRAST  . D-Dimer, Quantitative  . Basic Metabolic Panel (BMET)            Mertie Moores, MD  05/21/2019 5:35 PM    [facility] [address], [place], [place]  [address] Phone: [phone]; Fax: [phone]

## 2019-05-21 ENCOUNTER — Ambulatory Visit (INDEPENDENT_AMBULATORY_CARE_PROVIDER_SITE_OTHER): Payer: BC Managed Care – PPO | Admitting: Cardiovascular Disease

## 2019-05-21 ENCOUNTER — Encounter: Payer: Self-pay | Admitting: Rheumatology

## 2019-05-21 ENCOUNTER — Other Ambulatory Visit: Payer: Self-pay

## 2019-05-21 ENCOUNTER — Other Ambulatory Visit: Payer: Self-pay | Admitting: *Deleted

## 2019-05-21 ENCOUNTER — Telehealth (INDEPENDENT_AMBULATORY_CARE_PROVIDER_SITE_OTHER): Payer: BC Managed Care – PPO | Admitting: Rheumatology

## 2019-05-21 ENCOUNTER — Encounter: Payer: Self-pay | Admitting: Cardiovascular Disease

## 2019-05-21 VITALS — BP 138/72 | HR 89 | Ht 65.0 in | Wt 273.0 lb

## 2019-05-21 DIAGNOSIS — R202 Paresthesia of skin: Secondary | ICD-10-CM

## 2019-05-21 DIAGNOSIS — I70229 Atherosclerosis of native arteries of extremities with rest pain, unspecified extremity: Secondary | ICD-10-CM

## 2019-05-21 DIAGNOSIS — I10 Essential (primary) hypertension: Secondary | ICD-10-CM

## 2019-05-21 DIAGNOSIS — D472 Monoclonal gammopathy: Secondary | ICD-10-CM | POA: Diagnosis not present

## 2019-05-21 DIAGNOSIS — I739 Peripheral vascular disease, unspecified: Secondary | ICD-10-CM | POA: Diagnosis not present

## 2019-05-21 DIAGNOSIS — R700 Elevated erythrocyte sedimentation rate: Secondary | ICD-10-CM

## 2019-05-21 DIAGNOSIS — R200 Anesthesia of skin: Secondary | ICD-10-CM

## 2019-05-21 DIAGNOSIS — R0789 Other chest pain: Secondary | ICD-10-CM

## 2019-05-21 DIAGNOSIS — Z9889 Other specified postprocedural states: Secondary | ICD-10-CM

## 2019-05-21 DIAGNOSIS — E782 Mixed hyperlipidemia: Secondary | ICD-10-CM | POA: Diagnosis not present

## 2019-05-21 DIAGNOSIS — Z959 Presence of cardiac and vascular implant and graft, unspecified: Secondary | ICD-10-CM

## 2019-05-21 DIAGNOSIS — I998 Other disorder of circulatory system: Secondary | ICD-10-CM

## 2019-05-21 DIAGNOSIS — M1711 Unilateral primary osteoarthritis, right knee: Secondary | ICD-10-CM

## 2019-05-21 DIAGNOSIS — M5030 Other cervical disc degeneration, unspecified cervical region: Secondary | ICD-10-CM

## 2019-05-21 DIAGNOSIS — R768 Other specified abnormal immunological findings in serum: Secondary | ICD-10-CM | POA: Diagnosis not present

## 2019-05-21 DIAGNOSIS — I729 Aneurysm of unspecified site: Secondary | ICD-10-CM

## 2019-05-21 DIAGNOSIS — R7303 Prediabetes: Secondary | ICD-10-CM

## 2019-05-21 DIAGNOSIS — T81718A Complication of other artery following a procedure, not elsewhere classified, initial encounter: Secondary | ICD-10-CM

## 2019-05-21 LAB — BASIC METABOLIC PANEL
BUN/Creatinine Ratio: 10 (ref 9–23)
BUN: 10 mg/dL (ref 6–24)
CO2: 25 mmol/L (ref 20–29)
Calcium: 9.1 mg/dL (ref 8.7–10.2)
Chloride: 104 mmol/L (ref 96–106)
Creatinine, Ser: 0.98 mg/dL (ref 0.57–1.00)
GFR calc Af Amer: 75 mL/min/{1.73_m2} (ref >59)
GFR calc non Af Amer: 65 mL/min/{1.73_m2} (ref >59)
Glucose: 102 mg/dL — ABNORMAL HIGH (ref 65–99)
Potassium: 4.2 mmol/L (ref 3.5–5.2)
Sodium: 138 mmol/L (ref 134–144)

## 2019-05-21 LAB — D-DIMER, QUANTITATIVE: D-DIMER: 0.68 mg/L FEU — ABNORMAL HIGH (ref 0.00–0.49)

## 2019-05-21 NOTE — Patient Instructions (Addendum)
Medication Instructions:  Your physician has recommended you make the following change in your medication:  START Pepcid complete - over the counter - take 1 each pill each morning 20 min before your breakfast  *If you need a refill on your cardiac medications before your next appointment, please call your pharmacy*  Lab Work: TODAY - d-dimer, BMET If you have labs (blood work) drawn today and your tests are completely normal, you will receive your results only by: Marland Kitchen MyChart Message (if you have MyChart) OR . A paper copy in the mail If you have any lab test that is abnormal or we need to change your treatment, we will call you to review the results.  Testing/Procedures: Non-Cardiac CT scanning, (CAT scanning), is a noninvasive, special x-ray that produces cross-sectional images of the body using x-rays and a computer. CT scans help physicians diagnose and treat medical conditions. For some CT exams, a contrast material is used to enhance visibility in the area of the body being studied. CT scans provide greater clarity and reveal more details than regular x-ray exams.    Follow-Up: At [facility], you and your health needs are our priority.  As part of our continuing mission to provide you with exceptional heart care, we have created designated Provider Care Teams.  These Care Teams include your primary Cardiologist (physician) and Advanced Practice Providers (APPs -  Physician Assistants and Nurse Practitioners) who all work together to provide you with the care you need, when you need it.  Your next appointment:   3 month(s) on April 21 at 9:20 am  The format for your next appointment:   In Person  Provider:   Mertie Moores, MD

## 2019-05-22 ENCOUNTER — Encounter: Payer: BC Managed Care – PPO | Admitting: Physical Therapy

## 2019-05-22 ENCOUNTER — Other Ambulatory Visit: Payer: Self-pay | Admitting: Physician Assistant

## 2019-05-23 ENCOUNTER — Ambulatory Visit: Payer: BC Managed Care – PPO | Admitting: Gastroenterology

## 2019-05-23 NOTE — Telephone Encounter (Signed)
Pt last saw Dr Acie Fredrickson 05/21/19, last labs 05/21/19 Creat 0.98, age 54, weight 123.8kg, based on specified criteria pt is on appropriate dosage of Eliquis 5mg  BID.  Will refill rx.

## 2019-05-27 ENCOUNTER — Encounter: Payer: Self-pay | Admitting: Family Medicine

## 2019-05-29 ENCOUNTER — Encounter: Payer: BC Managed Care – PPO | Admitting: Physical Therapy

## 2019-05-29 ENCOUNTER — Ambulatory Visit: Payer: BC Managed Care – PPO | Admitting: Family Medicine

## 2019-06-02 ENCOUNTER — Encounter: Payer: Self-pay | Admitting: Family Medicine

## 2019-06-04 ENCOUNTER — Other Ambulatory Visit: Payer: Self-pay

## 2019-06-04 MED ORDER — CLOPIDOGREL BISULFATE 75 MG PO TABS: 75.0000 mg | ORAL_TABLET | Freq: Every day | ORAL | 3 refills | Status: DC

## 2019-06-05 ENCOUNTER — Encounter: Payer: BC Managed Care – PPO | Admitting: Physical Therapy

## 2019-06-06 DIAGNOSIS — D472 Monoclonal gammopathy: Secondary | ICD-10-CM | POA: Diagnosis not present

## 2019-06-11 ENCOUNTER — Encounter: Payer: Self-pay | Admitting: Family Medicine

## 2019-06-12 ENCOUNTER — Encounter: Payer: BC Managed Care – PPO | Admitting: Physical Therapy

## 2019-06-14 ENCOUNTER — Ambulatory Visit (HOSPITAL_COMMUNITY)
Admission: RE | Admit: 2019-06-14 | Discharge: 2019-06-14 | Disposition: A | Discharge status: Home / Self Care | Payer: BC Managed Care – PPO | Source: Ambulatory Visit | Attending: Cardiovascular Disease | Admitting: Cardiovascular Disease

## 2019-06-14 ENCOUNTER — Ambulatory Visit (INDEPENDENT_AMBULATORY_CARE_PROVIDER_SITE_OTHER)
Admission: RE | Admit: 2019-06-14 | Discharge: 2019-06-14 | Disposition: A | Discharge status: Home / Self Care | Payer: BC Managed Care – PPO | Source: Ambulatory Visit | Attending: Cardiovascular Disease | Admitting: Cardiovascular Disease

## 2019-06-14 ENCOUNTER — Other Ambulatory Visit: Payer: Self-pay

## 2019-06-14 DIAGNOSIS — R0602 Shortness of breath: Secondary | ICD-10-CM | POA: Diagnosis not present

## 2019-06-14 DIAGNOSIS — Z959 Presence of cardiac and vascular implant and graft, unspecified: Secondary | ICD-10-CM

## 2019-06-14 DIAGNOSIS — I70229 Atherosclerosis of native arteries of extremities with rest pain, unspecified extremity: Secondary | ICD-10-CM

## 2019-06-14 DIAGNOSIS — I998 Other disorder of circulatory system: Secondary | ICD-10-CM | POA: Diagnosis not present

## 2019-06-14 DIAGNOSIS — R0789 Other chest pain: Secondary | ICD-10-CM | POA: Diagnosis not present

## 2019-06-14 DIAGNOSIS — Z9889 Other specified postprocedural states: Secondary | ICD-10-CM

## 2019-06-14 MED ORDER — IOHEXOL 350 MG/ML SOLN
80.0000 mL | Freq: Once | INTRAVENOUS | Status: AC | PRN
  Administered 2019-06-14: 80 mL via INTRAVENOUS

## 2019-06-19 ENCOUNTER — Encounter: Payer: BC Managed Care – PPO | Admitting: Physical Therapy

## 2019-06-21 ENCOUNTER — Ambulatory Visit: Payer: BC Managed Care – PPO | Admitting: Gastroenterology

## 2019-06-29 ENCOUNTER — Encounter: Payer: Self-pay | Admitting: Cardiovascular Disease

## 2019-06-29 ENCOUNTER — Ambulatory Visit (INDEPENDENT_AMBULATORY_CARE_PROVIDER_SITE_OTHER): Payer: BC Managed Care – PPO | Admitting: Cardiovascular Disease

## 2019-06-29 ENCOUNTER — Other Ambulatory Visit: Payer: Self-pay

## 2019-06-29 DIAGNOSIS — I739 Peripheral vascular disease, unspecified: Secondary | ICD-10-CM | POA: Diagnosis not present

## 2019-06-29 MED ORDER — SODIUM CHLORIDE 0.9% FLUSH: 3.0000 mL | Freq: Two times a day (BID) | INTRAVENOUS | Status: DC

## 2019-06-29 NOTE — H&P (View-Only) (Signed)
06/29/2019 Christina [name]   30-Jul-1965  [id number]  Primary Physician Libby Maw, MD Primary Cardiologist: Lorretta Harp MD Lupe Carney, [place]  HPI:  Christina [name] is a 54 y.o.  severely overweight single African-American female mother of 1 child, grandmother and 3 grandchildren who works Chief Operating Officer at [employer]. She was referred by Dr. Acie Fredrickson for peripheral vascular evaluation because of right calf claudication which waslifestyle limiting.I last saw her in the office  04/10/2019.She has a history of hypertension. She has chronic chest pain with a recent negative Myoview and a negative coronary CTA 1 year ago. She had fairly recent onset right calf pain approxi-1 month ago that occurred when she woke up 1 morning and has been fairly persistent with ambulation. She had [name] extremity arterial Doppler studies performed 04/28/2018 revealing a right ABI 0.61 with an occluded right popliteal artery.  I performedperipheral angiography 05/08/2018 revealing occluded above-knee popliteal artery extending down below the knee into the proximal anterior tibial and tibioperoneal trunk. She had a long complex procedure with penumbra aspiration thrombectomy followed by balloon angioplasty and implantation of 2 overlapping Tigris self-expanding stents beginning in the popliteal artery extending down into the anterior tibial artery. Unfortunately her tibioperoneal trunk remains occluded. Her Dopplers normalized and her pain is resolved. She did unfortunately have a left common femoral pseudoaneurysm and ultimately underwent ultrasound-guided thrombin injection and successfully was closed.  . She is on Eliquis and clopidogrel. Dopplers performed 05/26/2018 revealed a right ABI of 1.24 with a patent stent. She does continue to complain of atypical chest pain beginning under her right breast and going into her neck and jaw. She had a negative Myoview stress test in  December of last year and a coronary CTA revealing a coronary calcium score of 0 with normal coronary arteries. In addition, she is taken sublingual nitroglycerin for this which does not work. I reassured her that I do not think this is related to coronary artery disease.  Since I saw her 3 months ago she continues to have right calf claudication similar to her preintervention symptoms as well as numbness of her toes.  She describes recurrent claudication over the last 2 months when walking in [place].  Her most recent Dopplers show decline in her right ABI from 1.24 down to 0.93 with a moderate increase in the velocities within the tibioperoneal stent.  Based on this we decided to proceed with outpatient angiography   Current Meds  Medication Sig  . atorvastatin (LIPITOR) 80 MG tablet Take 1 tablet (80 mg total) by mouth daily.  . carvedilol (COREG) 6.25 MG tablet Take 1 tablet (6.25 mg total) by mouth 2 (two) times daily.  . clopidogrel (PLAVIX) 75 MG tablet Take 1 tablet (75 mg total) by mouth daily.  Marland Kitchen ELIQUIS 5 MG TABS tablet Take 1 tablet by mouth twice daily  . ergocalciferol (VITAMIN D2) 1.25 MG (50000 UT) capsule Take by mouth.  . hydrochlorothiazide (MICROZIDE) 12.5 MG capsule Take 1 capsule (12.5 mg total) by mouth daily.  . megestrol (MEGACE) 40 MG tablet Take 1 tablet (40 mg total) by mouth 2 (two) times daily.  . potassium chloride (KLOR-CON) 10 MEQ tablet Take 1 tablet (10 mEq total) by mouth 3 (three) times a week.  . valsartan (DIOVAN) 160 MG tablet Take 1 tablet (160 mg total) by mouth daily. TO REPLACE LOSARTAN  . vitamin B-12 (CYANOCOBALAMIN) 1000 MCG tablet Take 1,000 mcg by mouth daily.   Current Facility-Administered Medications  for the 06/29/19 encounter (Office Visit) with Lorretta Harp, MD  Medication  . sodium chloride flush (NS) 0.9 % injection 3 mL     Allergies  Allergen Reactions  . Lisinopril Cough    Social History   Socioeconomic History  .  Marital status: Single    Spouse name: Not on file  . Number of children: Not on file  . Years of education: Not on file  . Highest education level: Not on file  Occupational History  . Not on file  Tobacco Use  . Smoking status: Never Smoker  . Smokeless tobacco: Never Used  Substance and Sexual Activity  . Alcohol use: No  . Drug use: No  . Sexual activity: Yes  Other Topics Concern  . Not on file  Social History Narrative  . Not on file   Social Determinants of Health   Financial Resource Strain:   . Difficulty of Paying Living Expenses: Not on file  Food Insecurity:   . Worried About Charity fundraiser in the Last Year: Not on file  . Ran Out of Food in the Last Year: Not on file  Transportation Needs:   . Lack of Transportation (Medical): Not on file  . Lack of Transportation (Non-Medical): Not on file  Physical Activity:   . Days of Exercise per Week: Not on file  . Minutes of Exercise per Session: Not on file  Stress:   . Feeling of Stress : Not on file  Social Connections:   . Frequency of Communication with Friends and Family: Not on file  . Frequency of Social Gatherings with Friends and Family: Not on file  . Attends Religious Services: Not on file  . Active Member of Clubs or Organizations: Not on file  . Attends Archivist Meetings: Not on file  . Marital Status: Not on file  Intimate Partner Violence:   . Fear of Current or Ex-Partner: Not on file  . Emotionally Abused: Not on file  . Physically Abused: Not on file  . Sexually Abused: Not on file     Review of Systems: General: negative for chills, fever, night sweats or weight changes.  Cardiovascular: negative for chest pain, dyspnea on exertion, edema, orthopnea, palpitations, paroxysmal nocturnal dyspnea or shortness of breath Dermatological: negative for rash Respiratory: negative for cough or wheezing Urologic: negative for hematuria Abdominal: negative for nausea, vomiting,  diarrhea, bright red blood per rectum, melena, or hematemesis Neurologic: negative for visual changes, syncope, or dizziness All other systems reviewed and are otherwise negative except as noted above.    Blood pressure 130/78, pulse 81, height 5\' 5"  (1.651 m), weight 278 lb 9.6 oz (126.4 kg), SpO2 100 %.  General appearance: alert and no distress Neck: no adenopathy, no carotid bruit, no JVD, supple, symmetrical, trachea midline and thyroid not enlarged, symmetric, no tenderness/mass/nodules Lungs: clear to auscultation bilaterally Heart: regular rate and rhythm, S1, S2 normal, no murmur, click, rub or gallop Extremities: extremities normal, atraumatic, no cyanosis or edema Pulses: Absent right pedal pulse Skin: Skin color, texture, turgor normal. No rashes or lesions Neurologic: Alert and oriented X 3, normal strength and tone. Normal symmetric reflexes. Normal coordination and gait  EKG not performed today  ASSESSMENT AND PLAN:   Peripheral arterial disease ([place]) History of peripheral arterial disease status post complex long endovascular procedure for resting right [name] extremity discomfort/critical limb ischemia 05/08/2018 with thrombotic occlusion of the right popliteal and tibial peroneal trunk as well as anterior  tibial.  She underwent for 1/2-hour procedure with aspiration thrombectomy and stenting using a nitinol self-expanding stent and a Tigris self-expanding stent.  Follow-up following the procedure her Dopplers were fairly normal and her claudication resolved but symptoms returned within 4 months.  Her most recent Doppler study performed 06/14/2019 revealed a right ABI of 0.91 and a left of 1.03.  She had a 300 cm/s signal within the popliteal stent suggesting the potential for "in-stent restenosis.  Based on this we decided to proceed with outpatient diagnostic peripheral angiography.  She will need stop her Eliquis 2 days prior to the procedure.      Lorretta Harp MD  FACP,FACC,FAHA, [facility] 06/29/2019 5:03 PM

## 2019-06-29 NOTE — Progress Notes (Signed)
06/29/2019 Christina [name]   1965-06-16  [id number]  Primary Physician Libby Maw, MD Primary Cardiologist: Lorretta Harp MD Lupe Carney, [place]  HPI:  Christina [name] is a 54 y.o.  severely overweight single African-American female mother of 1 child, grandmother and 3 grandchildren who works Chief Operating Officer at [employer]. She was referred by Dr. Acie Fredrickson for peripheral vascular evaluation because of right calf claudication which waslifestyle limiting.I last saw her in the office  04/10/2019.She has a history of hypertension. She has chronic chest pain with a recent negative Myoview and a negative coronary CTA 1 year ago. She had fairly recent onset right calf pain approxi-1 month ago that occurred when she woke up 1 morning and has been fairly persistent with ambulation. She had [name] extremity arterial Doppler studies performed 04/28/2018 revealing a right ABI 0.61 with an occluded right popliteal artery.  I performedperipheral angiography 05/08/2018 revealing occluded above-knee popliteal artery extending down below the knee into the proximal anterior tibial and tibioperoneal trunk. She had a long complex procedure with penumbra aspiration thrombectomy followed by balloon angioplasty and implantation of 2 overlapping Tigris self-expanding stents beginning in the popliteal artery extending down into the anterior tibial artery. Unfortunately her tibioperoneal trunk remains occluded. Her Dopplers normalized and her pain is resolved. She did unfortunately have a left common femoral pseudoaneurysm and ultimately underwent ultrasound-guided thrombin injection and successfully was closed.  . She is on Eliquis and clopidogrel. Dopplers performed 05/26/2018 revealed a right ABI of 1.24 with a patent stent. She does continue to complain of atypical chest pain beginning under her right breast and going into her neck and jaw. She had a negative Myoview stress test in  December of last year and a coronary CTA revealing a coronary calcium score of 0 with normal coronary arteries. In addition, she is taken sublingual nitroglycerin for this which does not work. I reassured her that I do not think this is related to coronary artery disease.  Since I saw her 3 months ago she continues to have right calf claudication similar to her preintervention symptoms as well as numbness of her toes.  She describes recurrent claudication over the last 2 months when walking in [place].  Her most recent Dopplers show decline in her right ABI from 1.24 down to 0.93 with a moderate increase in the velocities within the tibioperoneal stent.  Based on this we decided to proceed with outpatient angiography   Current Meds  Medication Sig  . atorvastatin (LIPITOR) 80 MG tablet Take 1 tablet (80 mg total) by mouth daily.  . carvedilol (COREG) 6.25 MG tablet Take 1 tablet (6.25 mg total) by mouth 2 (two) times daily.  . clopidogrel (PLAVIX) 75 MG tablet Take 1 tablet (75 mg total) by mouth daily.  Marland Kitchen ELIQUIS 5 MG TABS tablet Take 1 tablet by mouth twice daily  . ergocalciferol (VITAMIN D2) 1.25 MG (50000 UT) capsule Take by mouth.  . hydrochlorothiazide (MICROZIDE) 12.5 MG capsule Take 1 capsule (12.5 mg total) by mouth daily.  . megestrol (MEGACE) 40 MG tablet Take 1 tablet (40 mg total) by mouth 2 (two) times daily.  . potassium chloride (KLOR-CON) 10 MEQ tablet Take 1 tablet (10 mEq total) by mouth 3 (three) times a week.  . valsartan (DIOVAN) 160 MG tablet Take 1 tablet (160 mg total) by mouth daily. TO REPLACE LOSARTAN  . vitamin B-12 (CYANOCOBALAMIN) 1000 MCG tablet Take 1,000 mcg by mouth daily.   Current Facility-Administered Medications  for the 06/29/19 encounter (Office Visit) with Lorretta Harp, MD  Medication  . sodium chloride flush (NS) 0.9 % injection 3 mL     Allergies  Allergen Reactions  . Lisinopril Cough    Social History   Socioeconomic History  .  Marital status: Single    Spouse name: Not on file  . Number of children: Not on file  . Years of education: Not on file  . Highest education level: Not on file  Occupational History  . Not on file  Tobacco Use  . Smoking status: Never Smoker  . Smokeless tobacco: Never Used  Substance and Sexual Activity  . Alcohol use: No  . Drug use: No  . Sexual activity: Yes  Other Topics Concern  . Not on file  Social History Narrative  . Not on file   Social Determinants of Health   Financial Resource Strain:   . Difficulty of Paying Living Expenses: Not on file  Food Insecurity:   . Worried About Charity fundraiser in the Last Year: Not on file  . Ran Out of Food in the Last Year: Not on file  Transportation Needs:   . Lack of Transportation (Medical): Not on file  . Lack of Transportation (Non-Medical): Not on file  Physical Activity:   . Days of Exercise per Week: Not on file  . Minutes of Exercise per Session: Not on file  Stress:   . Feeling of Stress : Not on file  Social Connections:   . Frequency of Communication with Friends and Family: Not on file  . Frequency of Social Gatherings with Friends and Family: Not on file  . Attends Religious Services: Not on file  . Active Member of Clubs or Organizations: Not on file  . Attends Archivist Meetings: Not on file  . Marital Status: Not on file  Intimate Partner Violence:   . Fear of Current or Ex-Partner: Not on file  . Emotionally Abused: Not on file  . Physically Abused: Not on file  . Sexually Abused: Not on file     Review of Systems: General: negative for chills, fever, night sweats or weight changes.  Cardiovascular: negative for chest pain, dyspnea on exertion, edema, orthopnea, palpitations, paroxysmal nocturnal dyspnea or shortness of breath Dermatological: negative for rash Respiratory: negative for cough or wheezing Urologic: negative for hematuria Abdominal: negative for nausea, vomiting,  diarrhea, bright red blood per rectum, melena, or hematemesis Neurologic: negative for visual changes, syncope, or dizziness All other systems reviewed and are otherwise negative except as noted above.    Blood pressure 130/78, pulse 81, height 5\' 5"  (1.651 m), weight 278 lb 9.6 oz (126.4 kg), SpO2 100 %.  General appearance: alert and no distress Neck: no adenopathy, no carotid bruit, no JVD, supple, symmetrical, trachea midline and thyroid not enlarged, symmetric, no tenderness/mass/nodules Lungs: clear to auscultation bilaterally Heart: regular rate and rhythm, S1, S2 normal, no murmur, click, rub or gallop Extremities: extremities normal, atraumatic, no cyanosis or edema Pulses: Absent right pedal pulse Skin: Skin color, texture, turgor normal. No rashes or lesions Neurologic: Alert and oriented X 3, normal strength and tone. Normal symmetric reflexes. Normal coordination and gait  EKG not performed today  ASSESSMENT AND PLAN:   Peripheral arterial disease ([place]) History of peripheral arterial disease status post complex long endovascular procedure for resting right [name] extremity discomfort/critical limb ischemia 05/08/2018 with thrombotic occlusion of the right popliteal and tibial peroneal trunk as well as anterior  tibial.  She underwent for 1/2-hour procedure with aspiration thrombectomy and stenting using a nitinol self-expanding stent and a Tigris self-expanding stent.  Follow-up following the procedure her Dopplers were fairly normal and her claudication resolved but symptoms returned within 4 months.  Her most recent Doppler study performed 06/14/2019 revealed a right ABI of 0.91 and a left of 1.03.  She had a 300 cm/s signal within the popliteal stent suggesting the potential for "in-stent restenosis.  Based on this we decided to proceed with outpatient diagnostic peripheral angiography.  She will need stop her Eliquis 2 days prior to the procedure.      Lorretta Harp MD  FACP,FACC,FAHA, [facility] 06/29/2019 5:03 PM

## 2019-06-29 NOTE — Patient Instructions (Signed)
Medication Instructions:  Your physician recommends that you continue on your current medications as directed. Please refer to the Current Medication list given to you today.  If you need a refill on your cardiac medications before your next appointment, please call your pharmacy.   Lab work: CBC, BMET If you have labs (blood work) drawn today and your tests are completely normal, you will receive your results only by: [facility] (if you have MyChart) OR A paper copy in the mail If you have any lab test that is abnormal or we need to change your treatment, we will call you to review the results.  Testing/Procedures: Your physician has requested that you have a peripheral vascular angiogram 07/12/2019 . This exam is performed at the hospital. During this exam IV contrast is used to look at arterial blood flow. Please review the information sheet given for details.  AND ONE WEEK AFTER PROCEDURE ON 07/12/2019  Your physician has requested that you have a lower extremity arterial exercise duplex. During this test, exercise and ultrasound are used to evaluate arterial blood flow in the legs. Allow one hour for this exam. There are no restrictions or special instructions.  AND ONE WEEK AFTER PROCEDURE ON 07/12/2019  Your physician has requested that you have an ankle brachial index (ABI). During this test an ultrasound and blood pressure cuff are used to evaluate the arteries that supply the arms and legs with blood. Allow thirty minutes for this exam. There are no restrictions or special instructions.   Follow-Up: At [facility], you and your health needs are our priority.  As part of our continuing mission to provide you with exceptional heart care, we have created designated Provider Care Teams.  These Care Teams include your primary Cardiologist (physician) and Advanced Practice Providers (APPs -  Physician Assistants and Nurse Practitioners) who all work together to provide you with the  care you need, when you need it. You may see Dr. Gwenlyn Found or one of the following Advanced Practice Providers on your designated Care Team:    Kerin Ransom, PA-C  [place], [place]  Coletta Memos, [place]  Your physician wants you to follow-up in: 2 weeks after procedure on 07/12/2019.  Any Other Special Instructions Will Be Listed Below (If Applicable).  You are scheduled for a Peripheral Angiogram on Thursday, March 11 with Dr. Quay Burow.  1. Please arrive at the [facility] (Main Entrance A) at [facility]: [address] [place], [place] [address] at 5:30 AM (This time is two hours before your procedure to ensure your preparation). Free valet parking service is available.   Special note: Every effort is made to have your procedure done on time. Please understand that emergencies sometimes delay scheduled procedures.  2. Diet: Do not eat solid foods after midnight.  The patient may have clear liquids until 5am upon the day of the procedure.  3. Labs: You will need to have blood drawn NEXT WEEK 4. Medication instructions in preparation for your procedure:   Contrast Allergy: No   Stop taking Eliquis 2 days prior to procedure on 07/10/2019   On the morning of your procedure, take your Plavix/Clopidogrel and any morning medicines NOT listed above.  You may use sips of water.  5. Plan for one night stay--bring personal belongings. 6. Bring a current list of your medications and current insurance cards. 7. You MUST have a responsible person to drive you home. 8. Someone MUST be with you the first 24 hours  after you arrive home or your discharge will be delayed. 9. Please wear clothes that are easy to get on and off and wear slip-on shoes.  You have to be tested for Covid prior to your procedure and quarantine for 3 days until the procedure. This will be scheduled on: 07/09/2019 at 11:05pm. This is a Drive Up Visit at the [facility] [address], [place].  Someone will direct you to the appropriate testing line. Stay in your car and someone will be with you shortly.   Thank you for allowing [place] to care for you!   -- [facility]

## 2019-06-29 NOTE — Assessment & Plan Note (Signed)
History of peripheral arterial disease status post complex long endovascular procedure for resting right lower extremity discomfort/critical limb ischemia 05/08/2018 with thrombotic occlusion of the right popliteal and tibial peroneal trunk as well as anterior tibial.  She underwent for 1/2-hour procedure with aspiration thrombectomy and stenting using a nitinol self-expanding stent and a Tigris self-expanding stent.  Follow-up following the procedure her Dopplers were fairly normal and her claudication resolved but symptoms returned within 4 months.  Her most recent Doppler study performed 06/14/2019 revealed a right ABI of 0.91 and a left of 1.03.  She had a 300 cm/s signal within the popliteal stent suggesting the potential for "in-stent restenosis.  Based on this we decided to proceed with outpatient diagnostic peripheral angiography.  She will need stop her Eliquis 2 days prior to the procedure.

## 2019-07-02 ENCOUNTER — Telehealth: Payer: Self-pay | Admitting: Cardiovascular Disease

## 2019-07-02 DIAGNOSIS — I739 Peripheral vascular disease, unspecified: Secondary | ICD-10-CM | POA: Diagnosis not present

## 2019-07-02 NOTE — Telephone Encounter (Signed)
Spoke with patient regarding follow up appointment with Dr. Gwenlyn Found after angiogram and dopplers---Tuesday 08/07/19 at 4:15pm.  Patient voiced her understanding.

## 2019-07-02 NOTE — Telephone Encounter (Signed)
Patient states she is requesting to speak with Dr. Kennon Holter nurse in regards to rescheduling procedure scheduled for 07/12/19.

## 2019-07-02 NOTE — Telephone Encounter (Signed)
[place]le[place] pt - stated that she could not get a ride to procedure that early at [place] on 3/11 - moved procedure to 1130 case. Called reps and LVM.

## 2019-07-03 LAB — BASIC METABOLIC PANEL
BUN/Creatinine Ratio: 16 (ref 9–23)
BUN: 14 mg/dL (ref 6–24)
CO2: 19 mmol/L — ABNORMAL LOW (ref 20–29)
Calcium: 10 mg/dL (ref 8.7–10.2)
Chloride: 104 mmol/L (ref 96–106)
Creatinine, Ser: 0.88 mg/dL (ref 0.57–1.00)
GFR calc Af Amer: 86 mL/min/{1.73_m2} (ref >59)
GFR calc non Af Amer: 74 mL/min/{1.73_m2} (ref >59)
Glucose: 127 mg/dL — ABNORMAL HIGH (ref 65–99)
Potassium: 4.5 mmol/L (ref 3.5–5.2)
Sodium: 139 mmol/L (ref 134–144)

## 2019-07-03 LAB — CBC
Hematocrit: 39.4 % (ref 34.0–46.6)
Hemoglobin: 13.1 g/dL (ref 11.1–15.9)
MCH: 31.3 pg (ref 26.6–33.0)
MCHC: 33.2 g/dL (ref 31.5–35.7)
MCV: 94 fL (ref 79–97)
Platelets: 278 10*3/uL (ref 150–450)
RBC: 4.18 x10E6/uL (ref 3.77–5.28)
RDW: 12 % (ref 11.7–15.4)
WBC: 6.6 10*3/uL (ref 3.4–10.8)

## 2019-07-03 NOTE — Telephone Encounter (Signed)
Follow up  Pt returning call to verify if her procedure move to a later time.   Please call

## 2019-07-03 NOTE — Telephone Encounter (Signed)
Spoke with pt, aware procedure is at 11:30 and she will nedd to be at the hospital at 9:30 am.

## 2019-07-04 ENCOUNTER — Telehealth: Payer: Self-pay | Admitting: Cardiovascular Disease

## 2019-07-04 NOTE — Telephone Encounter (Signed)
New Message:    Pt wants to know if she can have her COVID Screening on a Saturday?

## 2019-07-04 NOTE — Telephone Encounter (Signed)
Spoke with patient. Informed patient Covid screening has to be done 3 days prior to her procedure so the earliest the Covid screen can be done is on 07/09/19 due to the required time frame. Patient verbalized understanding.

## 2019-07-07 ENCOUNTER — Encounter: Payer: Self-pay | Admitting: Rheumatology

## 2019-07-07 DIAGNOSIS — R768 Other specified abnormal immunological findings in serum: Secondary | ICD-10-CM | POA: Diagnosis not present

## 2019-07-08 ENCOUNTER — Encounter: Payer: Self-pay | Admitting: Family Medicine

## 2019-07-09 ENCOUNTER — Other Ambulatory Visit (HOSPITAL_COMMUNITY)
Admission: RE | Admit: 2019-07-09 | Discharge: 2019-07-09 | Disposition: A | Discharge status: Home / Self Care | Payer: BC Managed Care – PPO | Source: Ambulatory Visit | Attending: Cardiovascular Disease | Admitting: Cardiovascular Disease

## 2019-07-09 DIAGNOSIS — Z20822 Contact with and (suspected) exposure to covid-19: Secondary | ICD-10-CM | POA: Insufficient documentation

## 2019-07-09 DIAGNOSIS — Z01812 Encounter for preprocedural laboratory examination: Secondary | ICD-10-CM | POA: Insufficient documentation

## 2019-07-10 ENCOUNTER — Telehealth: Payer: Self-pay | Admitting: *Deleted

## 2019-07-10 LAB — SARS CORONAVIRUS 2 (TAT 6-24 HRS): SARS Coronavirus 2: NEGATIVE

## 2019-07-10 NOTE — Telephone Encounter (Addendum)
Pt contacted pre-abdominal aortogram  scheduled at [facility] for: Thursday July 12, 2019 11:30 AM Verified arrival time and place: [facility]) at: 9:30 AM   No solid food after midnight prior to cath, clear liquids until 5 AM day of procedure. Contrast allergy: no  Hold: Eliquis-none 07/10/19 until post procedure. HCTZ-AM of procedure  Except hold medications AM meds can be  taken pre-cath with sip of water including: ASA 81 mg Plavix 75 mg  Confirmed patient has responsible adult to drive home post procedure and observe 24 hours after arriving home: yes  Currently, due to Covid-19 pandemic, only one person will be allowed with patient. Must be the same person for patient's entire stay and will be required to wear a mask. They will be asked to wait in the waiting room for the duration of the patient's stay.  Patients are required to wear a mask when they enter the hospital.      COVID-19 Pre-Screening Questions:  . In the past 7 to 10 days have you had a cough,  shortness of breath, headache, congestion, fever (100 or greater) body aches, chills, sore throat, or sudden loss of taste or sense of smell? no . Have you been around anyone with known Covid 19 in the past 7 to 10 days? no . Have you been around anyone who is awaiting Covid 19 test results in the past 7 to 10 days? no . Have you been around anyone who has been exposed to Covid 19, or has mentioned symptoms of Covid 19 within the past 7 to 10 days? no    I reviewed procedure/mask/visitor instructions, COVID-19 screening questions with patient, she verbalized understanding, thanked me for call.

## 2019-07-12 ENCOUNTER — Encounter (HOSPITAL_COMMUNITY)
Admission: RE | Disposition: A | Discharge status: Home / Self Care | Payer: Self-pay | Source: Home / Self Care | Attending: Cardiovascular Disease

## 2019-07-12 ENCOUNTER — Ambulatory Visit (HOSPITAL_COMMUNITY)
Admission: RE | Admit: 2019-07-12 | Discharge: 2019-07-13 | Disposition: A | Discharge status: Home / Self Care | Payer: BC Managed Care – PPO | Attending: Cardiovascular Disease | Admitting: Cardiovascular Disease

## 2019-07-12 ENCOUNTER — Other Ambulatory Visit: Payer: Self-pay

## 2019-07-12 DIAGNOSIS — K219 Gastro-esophageal reflux disease without esophagitis: Secondary | ICD-10-CM | POA: Insufficient documentation

## 2019-07-12 DIAGNOSIS — Z9889 Other specified postprocedural states: Secondary | ICD-10-CM | POA: Diagnosis present

## 2019-07-12 DIAGNOSIS — Z888 Allergy status to other drugs, medicaments and biological substances status: Secondary | ICD-10-CM | POA: Insufficient documentation

## 2019-07-12 DIAGNOSIS — Z7902 Long term (current) use of antithrombotics/antiplatelets: Secondary | ICD-10-CM | POA: Insufficient documentation

## 2019-07-12 DIAGNOSIS — E782 Mixed hyperlipidemia: Secondary | ICD-10-CM

## 2019-07-12 DIAGNOSIS — Z6841 Body Mass Index (BMI) 40.0 and over, adult: Secondary | ICD-10-CM | POA: Diagnosis not present

## 2019-07-12 DIAGNOSIS — Z7901 Long term (current) use of anticoagulants: Secondary | ICD-10-CM | POA: Diagnosis not present

## 2019-07-12 DIAGNOSIS — E785 Hyperlipidemia, unspecified: Secondary | ICD-10-CM | POA: Diagnosis not present

## 2019-07-12 DIAGNOSIS — E663 Overweight: Secondary | ICD-10-CM | POA: Insufficient documentation

## 2019-07-12 DIAGNOSIS — I739 Peripheral vascular disease, unspecified: Secondary | ICD-10-CM | POA: Diagnosis present

## 2019-07-12 DIAGNOSIS — Z79899 Other long term (current) drug therapy: Secondary | ICD-10-CM | POA: Insufficient documentation

## 2019-07-12 DIAGNOSIS — I10 Essential (primary) hypertension: Secondary | ICD-10-CM | POA: Insufficient documentation

## 2019-07-12 DIAGNOSIS — I70211 Atherosclerosis of native arteries of extremities with intermittent claudication, right leg: Secondary | ICD-10-CM | POA: Insufficient documentation

## 2019-07-12 DIAGNOSIS — I998 Other disorder of circulatory system: Secondary | ICD-10-CM | POA: Diagnosis present

## 2019-07-12 DIAGNOSIS — I70229 Atherosclerosis of native arteries of extremities with rest pain, unspecified extremity: Secondary | ICD-10-CM | POA: Diagnosis present

## 2019-07-12 HISTORY — PX: PERIPHERAL VASCULAR BALLOON ANGIOPLASTY: CATH118281

## 2019-07-12 HISTORY — PX: LOWER EXTREMITY ANGIOGRAPHY: CATH118251

## 2019-07-12 LAB — PREGNANCY, URINE: Preg Test, Ur: NEGATIVE

## 2019-07-12 LAB — POCT ACTIVATED CLOTTING TIME
Activated Clotting Time: 296 seconds
Activated Clotting Time: 268 seconds

## 2019-07-12 SURGERY — LOWER EXTREMITY ANGIOGRAPHY
Anesthesia: LOCAL | Laterality: Bilateral

## 2019-07-12 MED ORDER — SODIUM CHLORIDE 0.9 % IV SOLN: 250.0000 mL | INTRAVENOUS | Status: DC | PRN

## 2019-07-12 MED ORDER — HEPARIN (PORCINE) IN NACL 1000-0.9 UT/500ML-% IV SOLN
INTRAVENOUS | Status: AC
  Filled 2019-07-12: qty 500

## 2019-07-12 MED ORDER — IODIXANOL 320 MG/ML IV SOLN
INTRAVENOUS | Status: DC | PRN
  Administered 2019-07-12: 110 mL

## 2019-07-12 MED ORDER — CLOPIDOGREL BISULFATE 300 MG PO TABS
ORAL_TABLET | ORAL | Status: AC
  Filled 2019-07-12: qty 1

## 2019-07-12 MED ORDER — HEPARIN SODIUM (PORCINE) 1000 UNIT/ML IJ SOLN
INTRAMUSCULAR | Status: AC
  Filled 2019-07-12: qty 1

## 2019-07-12 MED ORDER — MORPHINE SULFATE (PF) 2 MG/ML IV SOLN: 2.0000 mg | INTRAVENOUS | Status: DC | PRN

## 2019-07-12 MED ORDER — FENTANYL CITRATE (PF) 100 MCG/2ML IJ SOLN
INTRAMUSCULAR | Status: DC | PRN
  Administered 2019-07-12: 25 ug via INTRAVENOUS

## 2019-07-12 MED ORDER — CLOPIDOGREL BISULFATE 300 MG PO TABS
ORAL_TABLET | ORAL | Status: DC | PRN
  Administered 2019-07-12: 300 mg via ORAL

## 2019-07-12 MED ORDER — SODIUM CHLORIDE 0.9 % WEIGHT BASED INFUSION: 1.0000 mL/kg/h | INTRAVENOUS | Status: DC

## 2019-07-12 MED ORDER — HYDRALAZINE HCL 20 MG/ML IJ SOLN: 5.0000 mg | INTRAMUSCULAR | Status: DC | PRN

## 2019-07-12 MED ORDER — HYDRALAZINE HCL 20 MG/ML IJ SOLN
INTRAMUSCULAR | Status: DC | PRN
  Administered 2019-07-12 (×2): 10 mg via INTRAVENOUS

## 2019-07-12 MED ORDER — HEPARIN (PORCINE) IN NACL 1000-0.9 UT/500ML-% IV SOLN
INTRAVENOUS | Status: DC | PRN
  Administered 2019-07-12: 500 mL

## 2019-07-12 MED ORDER — ASPIRIN 81 MG PO CHEW: 81.0000 mg | CHEWABLE_TABLET | ORAL | Status: DC

## 2019-07-12 MED ORDER — SODIUM CHLORIDE 0.9 % IV SOLN: INTRAVENOUS | Status: AC

## 2019-07-12 MED ORDER — CLOPIDOGREL BISULFATE 75 MG PO TABS
75.0000 mg | ORAL_TABLET | Freq: Every day | ORAL | Status: DC
  Administered 2019-07-13: 75 mg via ORAL
  Filled 2019-07-12: qty 1

## 2019-07-12 MED ORDER — FENTANYL CITRATE (PF) 100 MCG/2ML IJ SOLN
INTRAMUSCULAR | Status: AC
  Filled 2019-07-12: qty 2

## 2019-07-12 MED ORDER — ATORVASTATIN CALCIUM 80 MG PO TABS
80.0000 mg | ORAL_TABLET | Freq: Every day | ORAL | Status: DC
  Administered 2019-07-12: 80 mg via ORAL
  Filled 2019-07-12: qty 1

## 2019-07-12 MED ORDER — HEPARIN SODIUM (PORCINE) 1000 UNIT/ML IJ SOLN
INTRAMUSCULAR | Status: DC | PRN
  Administered 2019-07-12: 2500 [IU] via INTRAVENOUS
  Administered 2019-07-12: 12000 [IU] via INTRAVENOUS

## 2019-07-12 MED ORDER — CLOPIDOGREL BISULFATE 75 MG PO TABS
75.0000 mg | ORAL_TABLET | Freq: Once | ORAL | Status: AC
  Administered 2019-07-12: 75 mg via ORAL
  Filled 2019-07-12: qty 1

## 2019-07-12 MED ORDER — CLOPIDOGREL BISULFATE 75 MG PO TABS: 75.0000 mg | ORAL_TABLET | Freq: Every day | ORAL | Status: DC

## 2019-07-12 MED ORDER — HYDRALAZINE HCL 20 MG/ML IJ SOLN
INTRAMUSCULAR | Status: AC
  Filled 2019-07-12: qty 1

## 2019-07-12 MED ORDER — LIDOCAINE HCL (PF) 1 % IJ SOLN
INTRAMUSCULAR | Status: DC | PRN
  Administered 2019-07-12: 25 mL

## 2019-07-12 MED ORDER — ASPIRIN EC 81 MG PO TBEC
81.0000 mg | DELAYED_RELEASE_TABLET | Freq: Every day | ORAL | Status: DC
  Administered 2019-07-13: 81 mg via ORAL
  Filled 2019-07-12: qty 1

## 2019-07-12 MED ORDER — LIDOCAINE HCL (PF) 1 % IJ SOLN
INTRAMUSCULAR | Status: AC
  Filled 2019-07-12: qty 30

## 2019-07-12 MED ORDER — ONDANSETRON HCL 4 MG/2ML IJ SOLN
4.0000 mg | Freq: Four times a day (QID) | INTRAMUSCULAR | Status: DC | PRN
  Administered 2019-07-12: 4 mg via INTRAVENOUS
  Filled 2019-07-12: qty 2

## 2019-07-12 MED ORDER — POTASSIUM CHLORIDE CRYS ER 10 MEQ PO TBCR
10.0000 meq | EXTENDED_RELEASE_TABLET | ORAL | Status: DC
  Administered 2019-07-13: 10 meq via ORAL
  Filled 2019-07-12: qty 1

## 2019-07-12 MED ORDER — SODIUM CHLORIDE 0.9% FLUSH
3.0000 mL | Freq: Two times a day (BID) | INTRAVENOUS | Status: DC
  Administered 2019-07-12 – 2019-07-13 (×3): 3 mL via INTRAVENOUS

## 2019-07-12 MED ORDER — SODIUM CHLORIDE 0.9 % WEIGHT BASED INFUSION
3.0000 mL/kg/h | INTRAVENOUS | Status: DC
  Administered 2019-07-12: 3 mL/kg/h via INTRAVENOUS

## 2019-07-12 MED ORDER — MIDAZOLAM HCL 2 MG/2ML IJ SOLN
INTRAMUSCULAR | Status: DC | PRN
  Administered 2019-07-12: 1 mg via INTRAVENOUS

## 2019-07-12 MED ORDER — SODIUM CHLORIDE 0.9% FLUSH: 3.0000 mL | INTRAVENOUS | Status: DC | PRN

## 2019-07-12 MED ORDER — LABETALOL HCL 5 MG/ML IV SOLN: 10.0000 mg | INTRAVENOUS | Status: DC | PRN

## 2019-07-12 MED ORDER — ACETAMINOPHEN 325 MG PO TABS
650.0000 mg | ORAL_TABLET | ORAL | Status: DC | PRN
  Administered 2019-07-12: 650 mg via ORAL
  Filled 2019-07-12: qty 2

## 2019-07-12 MED ORDER — HYDROCHLOROTHIAZIDE 12.5 MG PO CAPS
12.5000 mg | ORAL_CAPSULE | Freq: Every day | ORAL | Status: DC
  Administered 2019-07-12 – 2019-07-13 (×2): 12.5 mg via ORAL
  Filled 2019-07-12 (×2): qty 1

## 2019-07-12 MED ORDER — CARVEDILOL 6.25 MG PO TABS
6.2500 mg | ORAL_TABLET | Freq: Two times a day (BID) | ORAL | Status: DC
  Administered 2019-07-12 – 2019-07-13 (×3): 6.25 mg via ORAL
  Filled 2019-07-12 (×3): qty 1

## 2019-07-12 MED ORDER — IRBESARTAN 150 MG PO TABS
150.0000 mg | ORAL_TABLET | Freq: Every day | ORAL | Status: DC
  Administered 2019-07-12 – 2019-07-13 (×2): 150 mg via ORAL
  Filled 2019-07-12 (×3): qty 1

## 2019-07-12 MED ORDER — MIDAZOLAM HCL 2 MG/2ML IJ SOLN
INTRAMUSCULAR | Status: AC
  Filled 2019-07-12: qty 2

## 2019-07-12 SURGICAL SUPPLY — 23 items
BAG SNAP BAND KOVER 36X36 (MISCELLANEOUS) ×3 IMPLANT
BALLN CHOCOLATE 4.0X80X135 (BALLOONS) ×3
BALLOON CHOCOLATE 4.0X80X135 (BALLOONS) ×2 IMPLANT
CATH CROSS OVER TEMPO 5F (CATHETERS) ×3 IMPLANT
CATH CXI SUPP 2.6F 150 ANG (CATHETERS) ×3 IMPLANT
CATH STRAIGHT 5FR 65CM (CATHETERS) ×3 IMPLANT
CLOSURE MYNX CONTROL 6F/7F (Vascular Products) ×3 IMPLANT
DCB RANGER 5.0X100 135 (BALLOONS) ×2 IMPLANT
GUIDEWIRE ANGLED .035X150CM (WIRE) ×3 IMPLANT
GUIDEWIRE ZILIENT 6G 018 (WIRE) ×3 IMPLANT
KIT ENCORE 26 ADVANTAGE (KITS) ×3 IMPLANT
KIT PV (KITS) ×3 IMPLANT
RANGER DCB 5.0X100 135 (BALLOONS) ×3
SHEATH PINNACLE 5F 10CM (SHEATH) ×3 IMPLANT
SHEATH PINNACLE 6F 10CM (SHEATH) ×3 IMPLANT
SHEATH PINNACLE ST 6F 65CM (SHEATH) ×3 IMPLANT
SYR MEDRAD MARK 7 150ML (SYRINGE) ×3 IMPLANT
TAPE VIPERTRACK RADIOPAQ (MISCELLANEOUS) ×2 IMPLANT
TAPE VIPERTRACK RADIOPAQUE (MISCELLANEOUS) ×1
TRANSDUCER W/STOPCOCK (MISCELLANEOUS) ×3 IMPLANT
TRAY PV CATH (CUSTOM PROCEDURE TRAY) ×3 IMPLANT
WIRE HITORQ VERSACORE ST 145CM (WIRE) ×3 IMPLANT
WIRE ZILIENT 014 4G (WIRE) ×3 IMPLANT

## 2019-07-12 NOTE — Plan of Care (Signed)
Continue to monitor

## 2019-07-12 NOTE — Progress Notes (Signed)
MOBILITY TEAM - Progress Note   07/12/19 1600  Mobility  Activity Stood at bedside  Level of Assistance Standby assist, set-up cues, supervision of patient - no hands on  Assistive Device None  Distance Ambulated (ft) 2 ft  Mobility Response Tolerated poorly  Bed Position Semi-fowlers   Patient agreeable to ambulation. Upon standing for ~1 min and taking a few steps, patient c/o nausea and dizziness requiring return to supine. BP 146/79. RN notified. Encouraged mobility later this evening with nursing staff, pt agreeable.  Mabeline Caras, PT, DPT Mobility Team Pager [phone]

## 2019-07-12 NOTE — Interval H&P Note (Signed)
History and Physical Interval Note:  07/12/2019 11:00 AM  Christina [name]  has presented today for surgery, with the diagnosis of critical limb ischemia.  The various methods of treatment have been discussed with the patient and family. After consideration of risks, benefits and other options for treatment, the patient has consented to  Procedure(s): ABDOMINAL AORTOGRAM W/[name] EXTREMITY (Bilateral) as a surgical intervention.  The patient's history has been reviewed, patient examined, no change in status, stable for surgery.  I have reviewed the patient's chart and labs.  Questions were answered to the patient's satisfaction.     Quay Burow

## 2019-07-12 NOTE — Progress Notes (Signed)
Patient arrived to 4E room 12 at this time. Telemetry applied and CCMD notified. V/s and assessment complete. Patient oriented to room and how to call nurse with any needs. Will continue to monitor.

## 2019-07-12 NOTE — Progress Notes (Signed)
Patient was nauseated and vomited after having lunch. Patient given zofran per MD order. Will continue to monitor.

## 2019-07-13 ENCOUNTER — Encounter: Payer: Self-pay | Admitting: Family Medicine

## 2019-07-13 DIAGNOSIS — I10 Essential (primary) hypertension: Secondary | ICD-10-CM | POA: Diagnosis not present

## 2019-07-13 DIAGNOSIS — I70211 Atherosclerosis of native arteries of extremities with intermittent claudication, right leg: Secondary | ICD-10-CM | POA: Diagnosis not present

## 2019-07-13 DIAGNOSIS — I998 Other disorder of circulatory system: Secondary | ICD-10-CM

## 2019-07-13 DIAGNOSIS — E785 Hyperlipidemia, unspecified: Secondary | ICD-10-CM | POA: Diagnosis not present

## 2019-07-13 DIAGNOSIS — Z6841 Body Mass Index (BMI) 40.0 and over, adult: Secondary | ICD-10-CM | POA: Diagnosis not present

## 2019-07-13 DIAGNOSIS — E782 Mixed hyperlipidemia: Secondary | ICD-10-CM

## 2019-07-13 DIAGNOSIS — Z959 Presence of cardiac and vascular implant and graft, unspecified: Secondary | ICD-10-CM

## 2019-07-13 DIAGNOSIS — K219 Gastro-esophageal reflux disease without esophagitis: Secondary | ICD-10-CM | POA: Diagnosis not present

## 2019-07-13 DIAGNOSIS — E663 Overweight: Secondary | ICD-10-CM | POA: Diagnosis not present

## 2019-07-13 DIAGNOSIS — Z79899 Other long term (current) drug therapy: Secondary | ICD-10-CM | POA: Diagnosis not present

## 2019-07-13 DIAGNOSIS — Z888 Allergy status to other drugs, medicaments and biological substances status: Secondary | ICD-10-CM | POA: Diagnosis not present

## 2019-07-13 DIAGNOSIS — Z7901 Long term (current) use of anticoagulants: Secondary | ICD-10-CM | POA: Diagnosis not present

## 2019-07-13 DIAGNOSIS — Z7902 Long term (current) use of antithrombotics/antiplatelets: Secondary | ICD-10-CM | POA: Diagnosis not present

## 2019-07-13 LAB — CBC
HCT: 34 % — ABNORMAL LOW (ref 36.0–46.0)
Hemoglobin: 11.3 g/dL — ABNORMAL LOW (ref 12.0–15.0)
MCH: 31.3 pg (ref 26.0–34.0)
MCHC: 33.2 g/dL (ref 30.0–36.0)
MCV: 94.2 fL (ref 80.0–100.0)
Platelets: 225 10*3/uL (ref 150–400)
RBC: 3.61 MIL/uL — ABNORMAL LOW (ref 3.87–5.11)
RDW: 13.1 % (ref 11.5–15.5)
WBC: 8.8 10*3/uL (ref 4.0–10.5)
nRBC: 0 % (ref 0.0–0.2)

## 2019-07-13 LAB — BASIC METABOLIC PANEL
Anion gap: 12 (ref 5–15)
BUN: 13 mg/dL (ref 6–20)
CO2: 22 mmol/L (ref 22–32)
Calcium: 9 mg/dL (ref 8.9–10.3)
Chloride: 104 mmol/L (ref 98–111)
Creatinine, Ser: 0.89 mg/dL (ref 0.44–1.00)
GFR calc Af Amer: >60 mL/min (ref >60)
GFR calc non Af Amer: >60 mL/min (ref >60)
Glucose, Bld: 104 mg/dL — ABNORMAL HIGH (ref 70–99)
Potassium: 3.9 mmol/L (ref 3.5–5.1)
Sodium: 138 mmol/L (ref 135–145)

## 2019-07-13 MED ORDER — ASPIRIN 81 MG PO TBEC: 81.0000 mg | DELAYED_RELEASE_TABLET | Freq: Every day | ORAL | 0 refills | Status: AC

## 2019-07-13 MED ORDER — APIXABAN 5 MG PO TABS
5.0000 mg | ORAL_TABLET | Freq: Two times a day (BID) | ORAL | Status: DC
  Administered 2019-07-13: 5 mg via ORAL
  Filled 2019-07-13: qty 1

## 2019-07-13 NOTE — Discharge Instructions (Signed)
Peripheral Vascular Disease  Peripheral vascular disease (PVD) is a disease of the blood vessels that are not part of your heart and brain. A simple term for PVD is poor circulation. In most cases, PVD narrows the blood vessels that carry blood from your heart to the rest of your body. This can reduce the supply of blood to your arms, legs, and internal organs, like your stomach or kidneys. However, PVD most often affects a person's lower legs and feet. Without treatment, PVD tends to get worse. PVD can also lead to acute ischemic limb. This is when an arm or leg suddenly cannot get enough blood. This is a medical emergency. Follow these instructions at home: Lifestyle  Do not use any products that contain nicotine or tobacco, such as cigarettes and e-cigarettes. If you need help quitting, ask your doctor.  Lose weight if you are overweight. Or, stay at a healthy weight as told by your doctor.  Eat a diet that is low in fat and cholesterol. If you need help, ask your doctor.  Exercise regularly. Ask your doctor for activities that are right for you. General instructions  Take over-the-counter and prescription medicines only as told by your doctor.  Take good care of your feet: ? Wear comfortable shoes that fit well. ? Check your feet often for any cuts or sores.  Keep all follow-up visits as told by your doctor This is important. Contact a doctor if:  You have cramps in your legs when you walk.  You have leg pain when you are at rest.  You have coldness in a leg or foot.  Your skin changes.  You are unable to get or have an erection (erectile dysfunction).  You have cuts or sores on your feet that do not heal. Get help right away if:  Your arm or leg turns cold, numb, and blue.  Your arms or legs become red, warm, swollen, painful, or numb.  You have chest pain.  You have trouble breathing.  You suddenly have weakness in your face, arm, or leg.  You become very  confused or you cannot speak.  You suddenly have a very bad headache.  You suddenly cannot see. Summary  Peripheral vascular disease (PVD) is a disease of the blood vessels.  A simple term for PVD is poor circulation. Without treatment, PVD tends to get worse.  Treatment may include exercise, low fat and low cholesterol diet, and quitting smoking. This information is not intended to replace advice given to you by your health care provider. Make sure you discuss any questions you have with your health care provider. Document Revised: 04/01/2017 Document Reviewed: 05/27/2016 [employer] Patient Education  2020 [employer]  

## 2019-07-13 NOTE — Progress Notes (Signed)
IV's and telemetry discontinued at this time. CCMD notified. Discharge instructions reviewed with patient All questions answered.

## 2019-07-13 NOTE — Discharge Summary (Addendum)
Discharge Summary    Patient ID: Christina [name] MRN: [id number]; DOB: 06-12-1965  Admit date: 07/12/2019 Discharge date: 07/13/2019  Primary Care Provider: Libby Maw, MD  Primary Cardiologist: Mertie Moores, MD  Primary Electrophysiologist:  None   Discharge Diagnoses    Principal Problem:   Critical limb ischemia with history of revascularization of same extremity Active Problems:   Essential hypertension   Claudication in peripheral vascular disease [facility])   Hyperlipidemia    Diagnostic Studies/Procedures    Lower extremity angiography 07/12/19: Final Impression: Successful right popliteal and anterior tibial artery chocolate balloon angioplasty followed by drug-coated balloon angioplasty for aggressive in-stent restenosis of overlapping Tigris stents placed in the setting of acute limb ischemia 05/08/2018.  She had excellent angiographic result.  Her Eliquis was stopped on 07/09/2019.  She has been on Plavix in addition.  I favor triple therapy for 1 month including baby aspirin, Plavix and Eliquis after which we can stop her aspirin.  We can start her Eliquis tomorrow morning prior to discharge.  She will need lower extremity arterial Doppler studies in our [facility] line office next week and back to see me 1 to 2 weeks thereafter.  She left the lab in stable condition.  _____________   History of Present Illness     Christina [name] is a 53 y.o. female with PMH of PVD s/p PCI to popliteal artery 05/2018 for management of critical limb ischemia, HTN, HLD, chest pain with normal coronary CTA in 2018, MGUS, and GERD, who presented for outpatient lower extremity angiography to further evaluate recent abnormal ABI's after a visit with Dr. Gwenlyn Found 06/29/19.  Hospital Course     Consultants: None   1. Peripheral vascular disease: patient presented for further evaluation of abnormal ABI's c/f ISR of previously placed popliteal stent. She underwent lower extremity angiography 07/12/19  with successful right popliteal and anterior tibila artery chocolate ballooon angioplasty followed by drug-coated balloon angioplasty for aggressive ISR of previously placed stents 05/2018. She was recommended for triple therapy with aspirin, plavix, and eliquis for 1 month, after which the aspirin can be discontinued. LEA/ABI's are scheduled for 08/01/19. Eliquis was restarted 07/13/19.  - Continue aspirin, plavix, and eliquis for 1 month, then discontinue aspirin - Continue statin  2. HTN: BP initially elevated, improved with resuming home medications - Continue carvedilol, HCTZ, and valsartan  3. HLD: LDL 55 09/2018 - Continue atorvastatin  Did the patient have an acute coronary syndrome (MI, NSTEMI, STEMI, etc) this admission?:  No                               Did the patient have a percutaneous coronary intervention (stent / angioplasty)?:  No.   _____________  Discharge Vitals Blood pressure 113/66, pulse 90, temperature 99 F (37.2 C), temperature source Oral, resp. rate 20, height '5\' 5"'  (1.651 m), weight 124.7 kg, SpO2 100 %.  Filed Weights   07/12/19 0948  Weight: 124.7 kg    GEN: Sitting upright in bed in no acute distress.   Neck: No JVD, no carotid bruits Cardiac: RRR, no murmurs, rubs, or gallops. Left groin site without hematoma or ecchymosis. Distal pulses 2+, legs are warm  Respiratory: Clear to auscultation bilaterally, no wheezes/ rales/ rhonchi GI: NABS, Soft, nontender, non-distended  MS: No edema; No deformity. Neuro:  Nonfocal, moving all extremities spontaneously Psych: Normal affect     Labs & Radiologic Studies    CBC Recent Labs  07/13/19 0304  WBC 8.8  HGB 11.3*  HCT 34.0*  MCV 94.2  PLT 841   Basic Metabolic Panel Recent Labs    07/13/19 0304  NA 138  K 3.9  CL 104  CO2 22  GLUCOSE 104*  BUN 13  CREATININE 0.89  CALCIUM 9.0   Liver Function Tests No results for input(s): AST, ALT, ALKPHOS, BILITOT, PROT, ALBUMIN in the last 72  hours. No results for input(s): LIPASE, AMYLASE in the last 72 hours. High Sensitivity Troponin:   No results for input(s): TROPONINIHS in the last 720 hours.  BNP Invalid input(s): POCBNP D-Dimer No results for input(s): DDIMER in the last 72 hours. Hemoglobin A1C No results for input(s): HGBA1C in the last 72 hours. Fasting Lipid Panel No results for input(s): CHOL, HDL, LDLCALC, TRIG, CHOLHDL, LDLDIRECT in the last 72 hours. Thyroid Function Tests No results for input(s): TSH, T4TOTAL, T3FREE, THYROIDAB in the last 72 hours.  Invalid input(s): FREET3 _____________  CT ANGIO CHEST PE W OR WO CONTRAST  Result Date: 06/14/2019 CLINICAL DATA:  Right-sided chest pain and shortness of breath EXAM: CT ANGIOGRAPHY CHEST WITH CONTRAST TECHNIQUE: Multidetector CT imaging of the chest was performed using the standard protocol during bolus administration of intravenous contrast. Multiplanar CT image reconstructions and MIPs were obtained to evaluate the vascular anatomy. CONTRAST:  18m OMNIPAQUE IOHEXOL 350 MG/ML SOLN COMPARISON:  01/27/2019 FINDINGS: Cardiovascular: Thoracic aorta demonstrates a normal branching pattern. Mild atherosclerotic calcifications are noted. No aneurysmal dilatation or dissection is seen. No coronary calcifications are noted. No cardiac enlargement is seen. The pulmonary artery shows a normal branching pattern without filling defect to suggest pulmonary embolism. Mediastinum/Nodes: Thoracic inlet is within normal limits. No sizable hilar or mediastinal adenopathy is noted. The esophagus as visualized is within normal limits. Lungs/Pleura: Small right upper lobe nodules are noted stable from the prior exam. Tiny left upper lobe nodule is again seen best noted on image number 14 of series 6. No focal infiltrate or sizable effusion is noted. Upper Abdomen: Visualized upper abdomen shows no acute abnormality Musculoskeletal: No acute bony abnormality is noted. Review of the MIP  images confirms the above findings. IMPRESSION: No evidence of pulmonary emboli. Stable small lung nodules bilaterally. Given their size and stability dating back to 2019 there felt to be benign in etiology. No other focal abnormality is noted. Aortic Atherosclerosis (ICD10-I70.0). Electronically Signed   By: MInez CatalinaM.D.   On: 06/14/2019 13:29   PERIPHERAL VASCULAR CATHETERIZATION  Result Date: 07/12/2019  [id number]:  [facility] JQuay Burow M.D. 9April 24, 1967DATE OF PROCEDURE:  07/12/2019 DATE OF DISCHARGE: PV Angiogram/Intervention History obtained from chart review.Christina Daughertyis a 54y.o.  severely overweight single African-American female mother of 1 child, grandmother and 3 grandchildren who works cChief Operating Officerat [employer] She was referred by Dr. NAcie Fredricksonfor peripheral vascular evaluation because of right calf claudication which waslifestyle limiting.I last saw her in the office 04/10/2019.She has a history of hypertension. She has chronic chest pain with a recent negative Myoview and a negative coronary CTA 1 year ago. She had fairly recent onset right calf pain approxi-1 month ago that occurred when she woke up 1 morning and has been fairly persistent with ambulation. She had lower extremity arterial Doppler studies performed 04/28/2018 revealing a right ABI 0.61 with an occluded right popliteal artery.  I performedperipheral angiography 05/08/2018 revealing occluded above-knee popliteal artery extending down below the knee into the proximal anterior tibial and tibioperoneal  trunk. She had a long complex procedure with penumbra aspiration thrombectomy followed by balloon angioplasty and implantation of 2 overlapping Tigris self-expanding stents beginning in the popliteal artery extending down into the anterior tibial artery. Unfortunately her tibioperoneal trunk remains occluded. Her Dopplers normalized and her pain is resolved. She did unfortunately  have a left common femoral pseudoaneurysm and ultimately underwent ultrasound-guided thrombin injection and successfully was closed.  . She is on Eliquis and clopidogrel. Dopplers performed 05/26/2018 revealed a right ABI of 1.24 with a patent stent. She does continue to complain of atypical chest pain beginning under her right breast and going into her neck and jaw. She had a negative Myoview stress test in December of last year and a coronary CTA revealing a coronary calcium score of 0 with normal coronary arteries. In addition, she is taken sublingual nitroglycerin for this which does not work. I reassured her that I do not think this is related to coronary artery disease.  Since I saw her 3 months ago she continues to have right calf claudication similar to her preintervention symptoms as well as numbness of her toes.She describes recurrent claudication over the last 2 months when walking in [place]. Her most recent Dopplers show decline in her right ABI from 1.24 down to 0.93 with a moderate increase in the velocities within the tibioperoneal stent. Based on this we decided to proceed with outpatient angiography  Pre Procedure Diagnosis: Peripheral arterial disease Post Procedure Diagnosis: Peripheral arterial disease Operators: Dr. Quay Burow . Procedures Performed:  1.  Ultrasound-guided left common femoral access  2.  Contralateral access (second order catheter placement)  3.  Right lower extremity angiography with runoff  4.  Chocolate balloon angioplasty followed by drug-coated balloon angioplasty of the right anterior tibial and popliteal artery  5.  Left common femoral angiogram, Mynx closure left common femoral artery PROCEDURE DESCRIPTION: The patient was brought to the second floor [facility] lab in the the postabsorptive state. She was premedicated with IV Versed and fentanyl. Her left groin was prepped and shaved in usual sterile fashion. Xylocaine 1% was used for local  anesthesia. A 5 French sheath was inserted into the left common femoral artery using standard Seldinger technique.  Ultrasound was used to identify the left common femoral artery and guide access.  A digital image of this was captured and placed in the patient's chart.  A 5 French crossover catheter and endhole catheters were used to obtain contralateral access (secondary catheter placement).  Right lower extremity angiography was performed using bolus chase, digital subtraction and step table technique.  Isovue dye was used for the entirety of the case.  Retrograde aortic pressures monitored during the case.  Angiographic Data: 1: Right lower extremity-80% in-stent restenosis within the right popliteal and proximal anterior tibial stent with two-vessel runoff via the anterior tibial and posterior tibial arteries.  The peroneal was a small diminutive vessel that occluded in the mid vessel   Christina [name] has aggressive in-stent restenosis within the previously placed right anterior tibial and popliteal artery contributing to progressive claudication.  We will proceed with chocolate balloon angioplasty followed by DCB. Procedure Description: A 6 French 65 cm destination sheath was placed over a versa core wire into the proximal right superficial femoral artery (third order catheter placement).  The patient received a total of 14,500's of heparin with an ACT of 296.  Total of 110 cc of contrast was administered to the patient. Across the stented segment with an 014  Ziliant  4 g wire and placed it in the mid anterior tibial artery.  I then performed chocolate balloon angioplasty with a 4 mm x 80 mm long chocolate balloon in the proximal right anterior tibial and the entire popliteal artery stented segment and up to 10 to 12 atm.  Following this I used a 5 mm x 100 mm long Ranger drug-coated balloon and performed DCB of the stented segment resulting reduction of a long 80% "in-stent restenosis to 0% residual.  The patient  tolerated procedure well.  The guidewire was removed.  The sheath was withdrawn across the bifurcation and exchanged for a short 6 French sheath over an 035 versa core wire.  After performing a left femoral angiogram I successfully deployed a 6 mm MYNX closure device achieving hemostasis. Final Impression: Successful right popliteal and anterior tibial artery chocolate balloon angioplasty followed by drug-coated balloon angioplasty for aggressive in-stent restenosis of overlapping Tigris stents placed in the setting of acute limb ischemia 05/08/2018.  She had excellent angiographic result.  Her Eliquis was stopped on 07/09/2019.  She has been on Plavix in addition.  I favor triple therapy for 1 month including baby aspirin, Plavix and Eliquis after which we can stop her aspirin.  We can start her Eliquis tomorrow morning prior to discharge.  She will need lower extremity arterial Doppler studies in our [facility] line office next week and back to see me 1 to 2 weeks thereafter.  She left the lab in stable condition. Quay Burow. MD, [facility] 07/12/2019 1:00 PM   VAS [place] ABI WITH/WO TBI  Result Date: 06/14/2019 LOWER EXTREMITY DOPPLER STUDY Indications: Peripheral artery disease f/u. Patient reports claudication              symptoms in her right calf and great toe that limits her walking.              She can usually walk for about 5 minutes before she has to stop,              especially while out shopping around [place] etc. High Risk Factors: Hypertension, no history of smoking.  Vascular Interventions: 05/08/18- Penumbra aspiration thrombectomy of the right                         popliteal, anterior tibial, tibioperoneal trunk and                         posterior tibial artery. Chocolate balloon angioplasty                         and stenting of popliteal and anterior tibial artery. Comparison Study: In 11/20, ABI's were 0.93 on the right and 1.12 on the left. Performing Technologist: Mariane Masters RVT  Examination  Guidelines: A complete evaluation includes at minimum, Doppler waveform signals and systolic blood pressure reading at the level of bilateral brachial, anterior tibial, and posterior tibial arteries, when vessel segments are accessible. Bilateral testing is considered an integral part of a complete examination. Photoelectric Plethysmograph (PPG) waveforms and toe systolic pressure readings are included as required and additional duplex testing as needed. Limited examinations for reoccurring indications may be performed as noted.  ABI Findings: +---------+------------------+-----+--------+--------+ Right    Rt Pressure (mmHg)IndexWaveformComment  +---------+------------------+-----+--------+--------+ Brachial 148                                     +---------+------------------+-----+--------+--------+  ATA      134               0.91 biphasic         +---------+------------------+-----+--------+--------+ PTA      116               0.78 biphasic         +---------+------------------+-----+--------+--------+ PERO     123               0.83 biphasic         +---------+------------------+-----+--------+--------+ Great Toe58                0.39 Abnormal         +---------+------------------+-----+--------+--------+ +---------+------------------+-----+---------+-------+ Left     Lt Pressure (mmHg)IndexWaveform Comment +---------+------------------+-----+---------+-------+ Brachial 142                                     +---------+------------------+-----+---------+-------+ ATA      144               0.97 triphasic        +---------+------------------+-----+---------+-------+ PTA      138               0.93 biphasic         +---------+------------------+-----+---------+-------+ PERO     153               1.03 triphasic        +---------+------------------+-----+---------+-------+ Theodoro Parma               0.76 Normal            +---------+------------------+-----+---------+-------+ +-------+-----------+-----------+------------+------------+ ABI/TBIToday's ABIToday's TBIPrevious ABIPrevious TBI +-------+-----------+-----------+------------+------------+ Right  0.91       0.39       0.93        0.73         +-------+-----------+-----------+------------+------------+ Left   1.03       0.76       1.12        0.79         +-------+-----------+-----------+------------+------------+ Bilateral ABIs appear essentially unchanged compared to prior study on 03/28/19. Right TBIs appear decreased compared to prior study on 03/28/19.  Summary: Right: Resting right ankle-brachial index indicates mild right lower extremity arterial disease. The right toe-brachial index is abnormal. Left: Resting left ankle-brachial index is within normal range. No evidence of significant left lower extremity arterial disease. The left toe-brachial index is normal.  *See table(s) above for measurements and observations. See arterial duplex report.  Vascular consult recommended. Electronically signed by Jenkins Rouge MD on 06/14/2019 at 8:36:39 PM.    Final    VAS [place] LOWER EXTREMITY ARTERIAL DUPLEX  Result Date: 06/14/2019 LOWER EXTREMITY ARTERIAL DUPLEX STUDY Indications: Claudication. S/P right popliteal/tibial intervention in 1/20.              Patient reports claudication symptoms in her right calf and great              toe that limits her walking. She can usually walk for about five              minutes before she has to stop, especially while out shopping              around [place], etc. High Risk Factors: Hypertension, no history of smoking.  Vascular Interventions: 05/08/18- Penumbra aspiration thrombectomy of the right  popliteal, anterior tibial, tibioperoneal trunk and                         posterior tibial artery. Chocolate balloon angioplasty                         and stenting of popliteal and anterior tibial  artery. Current ABI:            Today's ABIs are 0.91 on the right and 1.03 on the left. Limitations: Technically challenging exam due to patient body habitus Comparison Study: Arterial duplex in 11/20 showed a patent BK popliteal stent                   with 50-74% stenosis of the tibioperoneal trunk. Performing Technologist: Mariane Masters RVT  Examination Guidelines: A complete evaluation includes B-mode imaging, spectral Doppler, color Doppler, and power Doppler as needed of all accessible portions of each vessel. Bilateral testing is considered an integral part of a complete examination. Limited examinations for reoccurring indications may be performed as noted.  +----------+--------+-----+---------------+---------+--------+ RIGHT     PSV cm/sRatioStenosis       Waveform Comments +----------+--------+-----+---------------+---------+--------+ CFA Prox  142                         triphasic         +----------+--------+-----+---------------+---------+--------+ DFA       64                          triphasic         +----------+--------+-----+---------------+---------+--------+ SFA Prox  104                         triphasic         +----------+--------+-----+---------------+---------+--------+ SFA Mid   106                         triphasic         +----------+--------+-----+---------------+---------+--------+ SFA Distal83                          triphasic         +----------+--------+-----+---------------+---------+--------+ TP Trunk  239          50-74% stenosistriphasic         +----------+--------+-----+---------------+---------+--------+  Right Stent(s): +---------------+--------+---------------+---------+--------+ right poplitealPSV cm/sStenosis       Waveform Comments +---------------+--------+---------------+---------+--------+ Prox to Stent  56                     triphasic          +---------------+--------+---------------+---------+--------+ Proximal Stent 137                    triphasic         +---------------+--------+---------------+---------+--------+ Mid Stent      318     50-99% stenosisstenotic VR 2.6   +---------------+--------+---------------+---------+--------+ Distal Stent   162                    triphasic         +---------------+--------+---------------+---------+--------+ Distal to Stent239                    triphasic         +---------------+--------+---------------+---------+--------+ Difficult to visualize  distal stent struts due to patient body habitus.   Summary: Right: Patent stent with evidence of 50-99% restenosis in the mid popliteal segment. 50-74% stenosis of the tibioperoneal trunk, low end of range. Moderate progression is noted compared to previous study.  See table(s) above for measurements and observations. Vascular consult recommended. Electronically signed by Jenkins Rouge MD on 06/14/2019 at 8:24:03 PM.    Final    Disposition   Pt is being discharged home today in good condition.  Follow-up Plans & Appointments    Follow-up Information    [facility] Follow up on 08/01/2019.   Specialty: Cardiology Why: Please arrive 15 minutes early for your 3:00pm appointment for repeat imaging of your legs Contact information: [address] [place] [phone]       Lorretta Harp, MD Follow up on 08/07/2019.   Specialties: Cardiology, Radiology Why: Please arrive 15 minutes early for your 4:15pm post-hospital follow-up.  Contact information: [address] [place] [address] [phone]        Nahser, Wonda Cheng, MD Follow up on 08/22/2019.   Specialty: Cardiology Why: Please arrive 15 minutes early for your 9:20am routine cardiology appointment Contact information: [address] [place] [address] [phone]             Discharge Medications   Allergies as of 07/13/2019      Reactions   Lisinopril Cough      Medication List    TAKE these medications   aspirin 81 MG EC tablet Take 1 tablet (81 mg total) by mouth daily. Start taking on: July 14, 2019   atorvastatin 80 MG tablet Commonly known as: LIPITOR Take 1 tablet (80 mg total) by mouth daily. What changed: when to take this   carvedilol 6.25 MG tablet Commonly known as: COREG Take 1 tablet (6.25 mg total) by mouth 2 (two) times daily.   clopidogrel 75 MG tablet Commonly known as: PLAVIX Take 1 tablet (75 mg total) by mouth daily.   Eliquis 5 MG Tabs tablet Generic drug: apixaban Take 1 tablet by mouth twice daily What changed: how much to take   ergocalciferol 1.25 MG (50000 UT) capsule Commonly known as: VITAMIN D2 Take 50,000 Units by mouth every Wednesday. Afternoon   hydrochlorothiazide 12.5 MG capsule Commonly known as: MICROZIDE Take 1 capsule (12.5 mg total) by mouth daily.   megestrol 40 MG tablet Commonly known as: MEGACE Take 1 tablet (40 mg total) by mouth 2 (two) times daily. What changed: when to take this   potassium chloride 10 MEQ tablet Commonly known as: KLOR-CON Take 1 tablet (10 mEq total) by mouth 3 (three) times a week. What changed:   when to take this  additional instructions   valsartan 160 MG tablet Commonly known as: DIOVAN Take 1 tablet (160 mg total) by mouth daily. TO REPLACE LOSARTAN   vitamin B-12 1000 MCG tablet Commonly known as: CYANOCOBALAMIN Take 1,000 mcg by mouth daily in the afternoon.   Vitamin D 50 MCG (2000 UT) tablet Take 2,000 Units by mouth daily in the afternoon.          Outstanding Labs/Studies   LEA/ABI's scheduled for 08/01/19   Duration of Discharge Encounter   Greater than 30 minutes including physician time.  Signed, Abigail Butts, PA-C 07/13/2019, 10:17 AM   Personally seen and examined. Agree with above.   54 year old with  critical limb ischemia successfully revascularized in the right  popliteal and anterior tibial artery balloon angioplasty followed by drug-coated balloon angioplasty progressive in-stent restenosis of previously placed stents in January 2020.  Triple therapy for 1 month which include aspirin Plavix and Eliquis.  After 1 month aspirin can be discontinued.  She has lower extremity ABIs scheduled on 08/01/2019.  Continue with statin therapy.  Left groin stable.  No hematoma.  Warm extremities.  Dopplerable pulses.  Okay for discharge.  Candee Furbish, MD

## 2019-07-13 NOTE — Progress Notes (Signed)
MOBILITY TEAM - Progress Note   07/13/19 0900  Mobility  Activity Ambulated in hall  Level of Assistance Independent  Assistive Device None  Distance Ambulated (ft) 400 ft  Mobility Response Tolerated well   Patient independent with mobility; tolerated hallway ambulation well. Patient reports ready for d/c home.  Mabeline Caras, PT, DPT Mobility Team Pager [phone]

## 2019-07-13 NOTE — Plan of Care (Signed)
Continue to monitor

## 2019-07-13 NOTE — Plan of Care (Signed)
Discharge to home °

## 2019-07-18 ENCOUNTER — Ambulatory Visit: Payer: Self-pay | Admitting: Neurology

## 2019-07-19 ENCOUNTER — Encounter (HOSPITAL_COMMUNITY): Payer: BC Managed Care – PPO

## 2019-07-19 ENCOUNTER — Ambulatory Visit: Payer: BC Managed Care – PPO | Admitting: Gastroenterology

## 2019-07-21 ENCOUNTER — Ambulatory Visit: Payer: BC Managed Care – PPO

## 2019-07-25 ENCOUNTER — Telehealth: Payer: Self-pay | Admitting: Gastroenterology

## 2019-07-25 NOTE — Telephone Encounter (Signed)
Patient is s/p right popliteal and anterior tibial artery chocolate balloon angioplasty followed by drug-coated balloon angioplasty and recently discharged on 07/09/19.  Patient is on triple therapy ASA, Eliquis, and Plaxix currently.  Patient calling requesting that we ask Dr. Gwenlyn Found to clear her for colonoscopy screening.  Patient is advised that prior to any procedures being scheduled she needs to discuss with Dr. Havery Moros for him to determine the timing of the colonoscopy ( cancelled last Sept due to not being able to hold her anticoagulation).  Patient has a virtual visit with Dr. Havery Moros in April. She is advised that she can discuss colonoscopy and if Dr. Havery Moros wants to proceed we will reach out to Dr. Gwenlyn Found to get clearance at this time.  She verbalized understanding.

## 2019-07-25 NOTE — Telephone Encounter (Signed)
Patient called requesting we send a release form to her cardiologist for clearance for her to schedule colonoscopy fax [phone] she would also like a call back to let her know it was sent.

## 2019-07-31 NOTE — Telephone Encounter (Signed)
disregard

## 2019-08-01 ENCOUNTER — Other Ambulatory Visit: Payer: Self-pay

## 2019-08-01 ENCOUNTER — Ambulatory Visit (HOSPITAL_COMMUNITY)
Admission: RE | Admit: 2019-08-01 | Discharge: 2019-08-01 | Disposition: A | Discharge status: Home / Self Care | Payer: BC Managed Care – PPO | Source: Ambulatory Visit | Attending: Internal Medicine | Admitting: Internal Medicine

## 2019-08-01 DIAGNOSIS — I739 Peripheral vascular disease, unspecified: Secondary | ICD-10-CM | POA: Diagnosis not present

## 2019-08-07 ENCOUNTER — Other Ambulatory Visit: Payer: Self-pay

## 2019-08-07 ENCOUNTER — Encounter: Payer: Self-pay | Admitting: Cardiovascular Disease

## 2019-08-07 ENCOUNTER — Ambulatory Visit (INDEPENDENT_AMBULATORY_CARE_PROVIDER_SITE_OTHER): Payer: BC Managed Care – PPO | Admitting: Cardiovascular Disease

## 2019-08-07 VITALS — BP 116/62 | HR 84 | Wt 285.0 lb

## 2019-08-07 DIAGNOSIS — I739 Peripheral vascular disease, unspecified: Secondary | ICD-10-CM | POA: Diagnosis not present

## 2019-08-07 NOTE — Patient Instructions (Signed)
Your physician recommends that you continue on your current medications as directed. Please refer to the Current Medication list given to you today.   Your physician has requested that you have a lower extremity arterial exercise duplex. During this test, exercise and ultrasound are used to evaluate arterial blood flow in the legs. Allow one hour for this exam. There are no restrictions or special instructions.  DUE IN  6 MONTHS Your physician wants you to follow-up in:  [place] will receive a reminder letter in the mail two months in advance. If you don't receive a letter, please call our office to schedule the follow-up appointment.

## 2019-08-07 NOTE — Assessment & Plan Note (Signed)
Exams returns today for post hospital follow-up of her recent endovascular procedure which I performed 07/12/2019 for "in-stent restenosis within her right popliteal and tibioperoneal trunk stent.  This was originally placed 05/08/2018.  She has had recurrent claudication with Dopplers suggesting restenosis.  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  I performed PTA with drug-eluting balloon with an excellent result.  Her claudication has resolved and her Dopplers have normalized.  She is back on aspirin which she will take for total of 1 month and then discontinue, Plavix indefinitely as well as Eliquis oral anticoagulation.  We will recheck Doppler studies in 6 months.

## 2019-08-07 NOTE — Progress Notes (Signed)
Lorretta Harp MD [place], [place] 08/07/2019 4:50 PM    08/07/2019 [name]   13-Apr-1966  AI:[id number]  Primary Physician Libby Maw, MD Primary Cardiologist: Lorretta Harp MD Lupe Car[place], [place]  HPI:  Christina [name] is a 54 y.o.  severely overweight single African-American female mother of 1 child, grandmother and 3 grandchildren who [name] Chief Operating Officer at [employer]. She was referred by Dr. Acie Fredrickson for peripheral vascular evaluation because of right calf claudication which waslifestyle limiting.I last saw her in the office  06/29/2019.She has a history of hypertension. She has chronic chest pain with a recent negative Myoview and a negative coronary CTA 1 year ago. She had fairly recent onset right calf pain approxi-1 month ago that occurred when she woke up 1 morning and has been fairly persistent with ambulation. She had lower extremity arterial Doppler studies performed 04/28/2018 revealing a right ABI 0.61 with an occluded right popliteal artery.  I performedperipheral angiography 05/08/2018 revealing occluded above-knee popliteal artery extending down below the knee into the proximal anterior tibial and tibioperoneal trunk. She had a long complex procedure with penumbra aspiration thrombectomy followed by balloon angioplasty and implantation of 2 overlapping Tigris self-expanding stents beginning in the popliteal artery extending down into the anterior tibial artery. Unfortunately her tibioperoneal trunk remains occluded. Her Dopplers normalized and her pain is resolved. She did unfortunately have a left common femoral pseudoaneurysm and ultimately underwent ultrasound-guided thrombin injection and successfully was closed.  . She is on Eliquis and clopidogrel. Dopplers performed 05/26/2018 revealed a right ABI of 1.24 with a patent stent. She does continue to complain of atypical chest pain beginning under her right breast and going into  her neck and jaw. She had a negative Myoview stress test in December of last year and a coronary CTA revealing a coronary calcium score of 0 with normal coronary arteries. In addition, she is taken sublingual nitroglycerin for this which does not work. I reassured her that I do not think this is related to coronary artery disease.  She continues to have right calf claudication similar to her preintervention symptoms as well as numbness of her toes.She describes recurrent claudication over the last 2 months when walking in [place]. Her most recent Dopplers show decline in her right ABI from 1.24 down to 0.93 with a moderate increase in the velocities within the tibioperoneal stent. Based on this we decided to proceed with outpatient angiography  I performed peripheral angiography on her via the left femoral approach 07/12/2019 revealing 80% "in-stent restenosis within the popliteal and tibioperoneal stent.  Performed drug-coated balloon angioplasty with excellent result.  Her claudication has resolved and her Dopplers are normalized.  She is back on triple therapy for 1 month after which aspirin will be discontinued.   Current Meds  Medication Sig  . aspirin EC 81 MG EC tablet Take 1 tablet (81 mg total) by mouth daily.  Marland Kitchen atorvastatin (LIPITOR) 80 MG tablet Take 1 tablet (80 mg total) by mouth daily. (Patient taking differently: Take 80 mg by mouth daily in the afternoon. )  . carvedilol (COREG) 6.25 MG tablet Take 1 tablet (6.25 mg total) by mouth 2 (two) times daily.  . Cholecalciferol (VITAMIN D) 50 MCG (2000 UT) tablet Take 2,000 Units by mouth daily in the afternoon.  . clopidogrel (PLAVIX) 75 MG tablet Take 1 tablet (75 mg total) by mouth daily.  Marland Kitchen ELIQUIS 5 MG TABS tablet Take 1 tablet by mouth twice daily (  Patient taking differently: Take 5 mg by mouth 2 (two) times daily. )  . ergocalciferol (VITAMIN D2) 1.25 MG (50000 UT) capsule Take 50,000 Units by mouth every Wednesday. Afternoon   . hydrochlorothiazide (MICROZIDE) 12.5 MG capsule Take 1 capsule (12.5 mg total) by mouth daily.  . megestrol (MEGACE) 40 MG tablet Take 1 tablet (40 mg total) by mouth 2 (two) times daily. (Patient taking differently: Take 40 mg by mouth daily in the afternoon. )  . potassium chloride (KLOR-CON) 10 MEQ tablet Take 1 tablet (10 mEq total) by mouth 3 (three) times a week. (Patient taking differently: Take 10 mEq by mouth every Monday, Wednesday, and Friday. In the afternoon)  . valsartan (DIOVAN) 160 MG tablet Take 1 tablet (160 mg total) by mouth daily. TO REPLACE LOSARTAN  . vitamin B-12 (CYANOCOBALAMIN) 1000 MCG tablet Take 1,000 mcg by mouth daily in the afternoon.      Allergies  Allergen Reactions  . Lisinopril Cough    Social History   Socioeconomic History  . Marital status: Single    Spouse name: Not on file  . Number of children: Not on file  . Years of education: Not on file  . Highest education level: Not on file  Occupational History  . Not on file  Tobacco Use  . Smoking status: Never Smoker  . Smokeless tobacco: Never Used  Substance and Sexual Activity  . Alcohol use: No  . Drug use: No  . Sexual activity: Yes  Other Topics Concern  . Not on file  Social History Narrative  . Not on file   Social Determinants of Health   Financial Resource Strain:   . Difficulty of Paying Living Expenses:   Food Insecurity:   . Worried About Charity fundraiser in the Last Year:   . Arboriculturist in the Last Year:   Transportation Needs:   . Film/video editor (Medical):   Marland Kitchen Lack of Transportation (Non-Medical):   Physical Activity:   . Days of Exercise per Week:   . Minutes of Exercise per Session:   Stress:   . Feeling of Stress :   Social Connections:   . Frequency of Communication with Friends and Family:   . Frequency of Social Gatherings with Friends and Family:   . Attends Religious Services:   . Active Member of Clubs or Organizations:   . Attends  Archivist Meetings:   Marland Kitchen Marital Status:   Intimate Partner Violence:   . Fear of Current or Ex-Partner:   . Emotionally Abused:   Marland Kitchen Physically Abused:   . Sexually Abused:      Review of Systems: General: negative for chills, fever, night sweats or weight changes.  Cardiovascular: negative for chest pain, dyspnea on exertion, edema, orthopnea, palpitations, paroxysmal nocturnal dyspnea or shortness of breath Dermatological: negative for rash Respiratory: negative for cough or wheezing Urologic: negative for hematuria Abdominal: negative for nausea, vomiting, diarrhea, bright red blood per rectum, melena, or hematemesis Neurologic: negative for visual changes, syncope, or dizziness All other systems reviewed and are otherwise negative except as noted above.    Blood pressure 116/62, pulse 84, weight 285 lb (129.3 kg).  General appearance: alert and no distress Neck: no adenopathy, no carotid bruit, no JVD, supple, symmetrical, trachea midline and thyroid not enlarged, symmetric, no tenderness/mass/nodules Lungs: clear to auscultation bilaterally Heart: regular rate and rhythm, S1, S2 normal, no murmur, click, rub or gallop Extremities: extremities normal, atraumatic, no cyanosis or  edema Pulses: 2+ and symmetric Skin: Skin color, texture, turgor normal. No rashes or lesions Neurologic: Alert and oriented X 3, normal strength and tone. Normal symmetric reflexes. Normal coordination and gait  EKG not performed today  ASSESSMENT AND PLAN:   Claudication in peripheral vascular disease ([place]) Exams returns today for post hospital follow-up of her recent endovascular procedure which I performed 07/12/2019 for "in-stent restenosis within her right popliteal and tibioperoneal trunk stent.  This was originally placed 05/08/2018.  She has had recurrent claudication with Dopplers suggesting restenosis.  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  I performed PTA with  drug-eluting balloon with an excellent result.  Her claudication has resolved and her Dopplers have normalized.  She is back on aspirin which she will take for total of 1 month and then discontinue, Plavix indefinitely as well as Eliquis oral anticoagulation.  We will recheck Doppler studies in 6 months.      Lorretta Harp MD FACP,FACC,FAHA, [facility] 08/07/2019 4:50 PM

## 2019-08-09 ENCOUNTER — Ambulatory Visit: Payer: BC Managed Care – PPO | Admitting: Family Medicine

## 2019-08-16 ENCOUNTER — Encounter: Payer: Self-pay | Admitting: Family Medicine

## 2019-08-16 ENCOUNTER — Encounter: Payer: Self-pay | Admitting: Rheumatology

## 2019-08-16 NOTE — Telephone Encounter (Signed)
Please mail a copy of the labs to the patient.

## 2019-08-20 ENCOUNTER — Encounter: Payer: Self-pay | Admitting: Neurology

## 2019-08-20 ENCOUNTER — Other Ambulatory Visit: Payer: Self-pay

## 2019-08-20 ENCOUNTER — Ambulatory Visit (INDEPENDENT_AMBULATORY_CARE_PROVIDER_SITE_OTHER): Payer: BC Managed Care – PPO | Admitting: Neurology

## 2019-08-20 VITALS — BP 120/75 | HR 73 | Temp 97.7°F | Ht 65.0 in | Wt 280.0 lb

## 2019-08-20 DIAGNOSIS — R202 Paresthesia of skin: Secondary | ICD-10-CM

## 2019-08-20 DIAGNOSIS — R7989 Other specified abnormal findings of blood chemistry: Secondary | ICD-10-CM | POA: Diagnosis not present

## 2019-08-20 DIAGNOSIS — R7309 Other abnormal glucose: Secondary | ICD-10-CM | POA: Diagnosis not present

## 2019-08-20 DIAGNOSIS — E538 Deficiency of other specified B group vitamins: Secondary | ICD-10-CM | POA: Diagnosis not present

## 2019-08-20 DIAGNOSIS — R799 Abnormal finding of blood chemistry, unspecified: Secondary | ICD-10-CM | POA: Diagnosis not present

## 2019-08-20 MED ORDER — GABAPENTIN 100 MG PO CAPS: 100.0000 mg | ORAL_CAPSULE | Freq: Three times a day (TID) | ORAL | 6 refills | Status: DC

## 2019-08-20 NOTE — Progress Notes (Signed)
PATIENT: Christina [name] DOB: [birth year]  Chief Complaint  Patient presents with  . Numbness    Reports intermittent numbness/tingling in her upper and lower extremities.   . Rheumatology    Bo Merino, MD - referring provider  . PCP    Libby Maw, MD     HISTORICAL  Christina [name] is a 54 year old female, seen in request by her rheumatologist Dr. Georga Hacking and primary care physician Dr. Ethelene Hal, [facility] for evaluation of intermittent upper and lower extremity paresthesia, initial evaluation was on August 20, 2019.  I have reviewed and summarized the referring note from the referring physician.  She had a history of hypertension, hyperlipidemia, MGUS, history of peripheral vascular disease presented to his subacute onset of right lower extremity ischemia, She hadpenumbra aspiration thrombectomy followed by balloon angioplasty and implantation of 2 overlapping Tigris self-expanding stents beginning in the popliteal artery extending down into the anterior tibial artery,   She developed recurrent right calf claudication in 2021, had right popliteal and anterior tibial artery chocolate balloon angioplasty followed by drug-coated balloon angioplasty for aggressive in-stent restenosis of the overlapping Tigris stents on July 12, 2019.   She is now on Eliquis 5 mg twice a day, plus Plavix,  She complains of a year history of intermittent bilateral toes numbness tingling, especially after prolonged sitting, she needs standing up pace around to ease up her feet discomfort, she also complains of intermittent bilateral finger paresthesia, which only happen at nighttime, woke her up multiple times from sleep, she has to shake her hands to make sensation comes back, she denies significant weakness, no persistent sensory loss  She denies neck pain, low back pain, denies gait abnormality.  Laboratory evaluations in 2021, mild anemia hemoglobin of 11.3, BMP showed mild  elevated glucose 127  REVIEW OF SYSTEMS: Full 14 system review of systems performed and notable only for as above All other review of systems were negative.  ALLERGIES: Allergies  Allergen Reactions  . Lisinopril Cough    HOME MEDICATIONS: Current Outpatient Medications  Medication Sig Dispense Refill  . atorvastatin (LIPITOR) 80 MG tablet Take 1 tablet (80 mg total) by mouth daily. (Patient taking differently: Take 80 mg by mouth daily in the afternoon. ) 90 tablet 2  . carvedilol (COREG) 6.25 MG tablet Take 1 tablet (6.25 mg total) by mouth 2 (two) times daily. 180 tablet 3  . Cholecalciferol (VITAMIN D) 50 MCG (2000 UT) tablet Take 2,000 Units by mouth daily in the afternoon.    . clopidogrel (PLAVIX) 75 MG tablet Take 1 tablet (75 mg total) by mouth daily. 90 tablet 3  . ELIQUIS 5 MG TABS tablet Take 1 tablet by mouth twice daily (Patient taking differently: Take 5 mg by mouth 2 (two) times daily. ) 60 tablet 6  . ergocalciferol (VITAMIN D2) 1.25 MG (50000 UT) capsule Take 50,000 Units by mouth every Wednesday. Afternoon    . hydrochlorothiazide (MICROZIDE) 12.5 MG capsule Take 1 capsule (12.5 mg total) by mouth daily. 90 capsule 3  . megestrol (MEGACE) 40 MG tablet Take 1 tablet (40 mg total) by mouth 2 (two) times daily. (Patient taking differently: Take 40 mg by mouth daily in the afternoon. ) 180 tablet 3  . potassium chloride (KLOR-CON) 10 MEQ tablet Take 1 tablet (10 mEq total) by mouth 3 (three) times a week. (Patient taking differently: Take 10 mEq by mouth every Monday, Wednesday, and Friday. In the afternoon) 90 tablet 0  . valsartan (DIOVAN) 160  MG tablet Take 1 tablet (160 mg total) by mouth daily. TO REPLACE LOSARTAN 90 tablet 3  . vitamin B-12 (CYANOCOBALAMIN) 1000 MCG tablet Take 1,000 mcg by mouth daily in the afternoon.      No current facility-administered medications for this visit.    PAST MEDICAL HISTORY: Past Medical History:  Diagnosis Date  . Acute blood  loss as cause of postoperative anemia   . Anemia 01/22/2019  . Chest pain    a. prior coronary CT without CAD.  Marland Kitchen Elevated sed rate 01/22/2019  . Essential hypertension 07/08/2016  . GERD (gastroesophageal reflux disease)   . MGUS (monoclonal gammopathy of unknown significance)   . Numbness and tingling   . PAD (peripheral artery disease) ([place])    a. 05/2018: PV angio subacute thrombotic occlusion of her popliteal artery. She underwent successful penumbra aspiration thrombectomy, PTA and self-expanding stenting using overlapping Tigris self-expanding stents of a long thrombotic occlusion of the popliteal, anterior tibial and tibioperoneal trunk. Procedure c/b pseudoaneurysm/ABL anemia.  . 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[place])   . Uterine fibroid   . Vitamin D deficiency     PAST SURGICAL HISTORY: Past Surgical History:  Procedure Laterality Date  . ABDOMINAL AORTOGRAM W/LOWER EXTREMITY Right 05/08/2018   Procedure: ABDOMINAL AORTOGRAM W/LOWER EXTREMITY;  Surgeon: Lorretta Harp, MD;  Location: [facility];  Service: Cardiovascular;  Laterality: Right;  . LOWER EXTREMITY ANGIOGRAPHY  07/12/2019   Procedure: Lower Extremity Angiography;  Surgeon: Lorretta Harp, MD;  Location: [place];  Service: Cardiovascular;;  . lump removed from breast at age 34    . PERIPHERAL VASCULAR BALLOON ANGIOPLASTY  07/12/2019   Procedure: PERIPHERAL VASCULAR BALLOON ANGIOPLASTY;  Surgeon: Lorretta Harp, MD;  Location: [place] [facility];  Service: Cardiovascular;;  . PERIPHERAL VASCULAR INTERVENTION Right 05/08/2018   Procedure: PERIPHERAL VASCULAR INTERVENTION;  Surgeon: Lorretta Harp, MD;  Location: [facility];  Service: Cardiovascular;  Laterality: Right;  Anterior tibial and popliteal stents  . PERIPHERAL VASCULAR THROMBECTOMY Right 05/08/2018   Procedure: PERIPHERAL VASCULAR THROMBECTOMY;  Surgeon: Lorretta Harp, MD;  Location: [place] [facility];  Service:  Cardiovascular;  Laterality: Right;  Popliteal, tibioperoneal trunk, Anterior tibial, Posterior tibial    FAMILY HISTORY: Family History  Problem Relation Age of Onset  . Hypertension Mother   . Alzheimer's disease Father   . Healthy Son   . Colon polyps Neg Hx   . Crohn's disease Neg Hx   . Rectal cancer Neg Hx   . Stomach cancer Neg Hx   . Pancreatic cancer Neg Hx     SOCIAL HISTORY: Social History   Socioeconomic History  . Marital status: Single    Spouse name: Not on file  . Number of children: 1  . Years of education: two years of college  . Highest education level: Not on file  Occupational History  . Not on file  Tobacco Use  . Smoking status: Never Smoker  . Smokeless tobacco: Never Used  Substance and Sexual Activity  . Alcohol use: No  . Drug use: No  . Sexual activity: Yes  Other Topics Concern  . Not on file  Social History Narrative   Lives alone.   Right-handed.   No daily use of caffeine.   Social Determinants of Health   Financial Resource Strain:   . Difficulty of Paying Living Expenses:   Food Insecurity:   . Worried About Charity fundraiser in the Last Year:   .  Ran Out of Food in the Last Year:   Transportation Needs:   . Film/video editor (Medical):   Marland Kitchen Lack of Transportation (Non-Medical):   Physical Activity:   . Days of Exercise per Week:   . Minutes of Exercise per Session:   Stress:   . Feeling of Stress :   Social Connections:   . Frequency of Communication with Friends and Family:   . Frequency of Social Gatherings with Friends and Family:   . Attends Religious Services:   . Active Member of Clubs or Organizations:   . Attends Archivist Meetings:   Marland Kitchen Marital Status:   Intimate Partner Violence:   . Fear of Current or Ex-Partner:   . Emotionally Abused:   Marland Kitchen Physically Abused:   . Sexually Abused:      PHYSICAL EXAM   Vitals:   08/20/19 0732  BP: 120/75  Pulse: 73  Temp: 97.7 F (36.5 C)    Weight: 280 lb (127 kg)  Height: 5\' 5"  (1.651 m)    Not recorded      Body mass index is 46.59 kg/m.  PHYSICAL EXAMNIATION:  Gen: NAD, conversant, well nourised, well groomed                     Cardiovascular: Regular rate rhythm, no peripheral edema, warm, nontender. Eyes: Conjunctivae clear without exudates or hemorrhage Neck: Supple, no carotid bruits. Pulmonary: Clear to auscultation bilaterally   NEUROLOGICAL EXAM:  MENTAL STATUS: Speech:    Speech is normal; fluent and spontaneous with normal comprehension.  Cognition:     Orientation to time, place and person     Normal recent and remote memory     Normal Attention span and concentration     Normal Language, naming, repeating,spontaneous speech     Fund of knowledge   CRANIAL NERVES: CN II: Visual fields are full to confrontation. Pupils are round equal and briskly reactive to light. CN III, IV, VI: extraocular movement are normal. No ptosis. CN V: Facial sensation is intact to light touch CN VII: Face is symmetric with normal eye closure  CN VIII: Hearing is normal to causal conversation. CN IX, X: Phonation is normal. CN XI: Head turning and shoulder shrug are intact  MOTOR: There is no pronator drift of out-stretched arms. Muscle bulk and tone are normal. Muscle strength is normal.  REFLEXES: Reflexes are 1 and symmetric at the biceps, triceps, knees, and absent at ankles. Plantar responses are flexor.  SENSORY: Intact to light touch, pinprick and mildly decreased vibratory sensation at toes  COORDINATION: There is no trunk or limb dysmetria noted.  GAIT/STANCE: She needs push-up to get up from seated position, flatfeet, steady  DIAGNOSTIC DATA (LABS, IMAGING, TESTING) - I reviewed patient records, labs, notes, testing and imaging myself where available.   ASSESSMENT AND PLAN  Christina [name] is a 54 y.o. 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 Ph.D.  [facility] [address] [place], [place] [address] Ph: [phone] Fax: [phone]  CC: Bo Merino, MD, Libby Maw, MD

## 2019-08-21 ENCOUNTER — Encounter: Payer: Self-pay | Admitting: Gastroenterology

## 2019-08-21 ENCOUNTER — Telehealth: Payer: Self-pay

## 2019-08-21 ENCOUNTER — Telehealth (INDEPENDENT_AMBULATORY_CARE_PROVIDER_SITE_OTHER): Payer: BC Managed Care – PPO | Admitting: Gastroenterology

## 2019-08-21 VITALS — Ht 65.0 in | Wt 280.0 lb

## 2019-08-21 DIAGNOSIS — Z7901 Long term (current) use of anticoagulants: Secondary | ICD-10-CM

## 2019-08-21 DIAGNOSIS — K224 Dyskinesia of esophagus: Secondary | ICD-10-CM | POA: Diagnosis not present

## 2019-08-21 DIAGNOSIS — Z7902 Long term (current) use of antithrombotics/antiplatelets: Secondary | ICD-10-CM

## 2019-08-21 DIAGNOSIS — Z800 Family history of malignant neoplasm of digestive organs: Secondary | ICD-10-CM | POA: Diagnosis not present

## 2019-08-21 LAB — TSH: TSH: 0.54 u[IU]/mL (ref 0.450–4.500)

## 2019-08-21 LAB — VITAMIN B12: Vitamin B-12: 1031 pg/mL (ref 232–1245)

## 2019-08-21 LAB — FERRITIN: Ferritin: 84 ng/mL (ref 15–150)

## 2019-08-21 LAB — HGB A1C W/O EAG: Hgb A1c MFr Bld: 5.8 % — ABNORMAL HIGH (ref 4.8–5.6)

## 2019-08-21 LAB — RPR: RPR Ser Ql: NONREACTIVE

## 2019-08-21 NOTE — Telephone Encounter (Signed)
you are scheduled for a virtual visit with your provider today.  Just as we do with appointments in the office, we must obtain your consent to participate.  Your consent will be active for this visit and any virtual visit you may have with one of our providers in the next 365 days.  If you have a MyChart account, I can also send a copy of this consent to you electronically.  All virtual visits are billed to your insurance company just like a traditional visit in the office.  As this is a virtual visit, video technology does not allow for your provider to perform a traditional examination.  This may limit your provider's ability to fully assess your condition.  If your provider identifies any concerns that need to be evaluated in person or the need to arrange testing such as labs, procedures, imaging etc, we will make arrangements to do so.  Although advances in technology are sophisticated, we cannot ensure that it will always work on either your end or our end.  If the connection with a video visit is poor, we may have to switch to a telephone visit.  With either a video or telephone visit, we are not always able to ensure that we have a secure connection.   I need to obtain your verbal consent now.   Are you willing to proceed with your visit today?   The patient agrees to proceed with the appointment and gives their verbal consent.

## 2019-08-21 NOTE — Progress Notes (Signed)
Virtual Visit via Video Note  I connected with Christina [name] on 08/21/19 at  8:10 AM EDT by a video enabled telemedicine application and verified that I am speaking with the correct person using two identifiers.  Location: Patient: home Provider: office   I discussed the limitations of evaluation and management by telemedicine and the availability of in person appointments. The patient expressed understanding and agreed to proceed.   HPI :  54 year old female here for a follow-up visit for atypical chest pain. Last seen June 2020  Please see prior notes for details of her case.  She has been having ongoing intermittent chest discomfort since 2009.  She has had an extensive cardiac work-up with no evidence of cardiac related chest pain.  She has had prior right upper quadrant ultrasound, EGD, CT scan of her chest, barium swallow most recently.  None of these have shown any concerning pathology.  We have discussed esophageal manometry and pH testing in the past but she has not been able to follow through with that, she has been hesitant about doing the procedure, she is not sure if she can tolerate it.  Trials of high-dose PPI and Carafate in the past have not provided any benefit.  At her last visit we discussed the possibility for esophageal spasm.  I had recommended trying some peppermint alkaloids prior to eating or use of this when she is having pain.  She states she has tried this a few times but did not think it helped too much.  We did pursue a barium swallow since her last visit, this was done on December 2 which showed no evidence of a stricture or stenosis.  It did show some evidence of dysmotility however as well as a very small hiatal hernia.  Since her last visit she has been doing okay with this.  She still has occasional symptoms of spastic chest discomfort, occurs sometimes after she eats.  She feels if she lies upside down it can actually abort an episode so she has been doing  that when this occurs.  She states she can go weeks in between episodes and its not a routine occurrence.  We discussed how aggressive she want to be with treatment of this at this time.  Otherwise she tells me that her mother had colon cancer diagnosed at age 45 and she has never had a colonoscopy.  She has had a negative Cologuard in 2019.  She has been wanting to pursue colonoscopy for the past several months.  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  She states most recently about 3 weeks ago she had another stent placed in her leg for symptoms of claudication and pain, she states the stent has relieved her symptoms and she is feeling much better.  She is currently taking Plavix and Eliquis.  She denies any blood in her stools.  She denies any problems with her bowels.  She denies any problems with abdominal pain.  She has a mild normocytic anemia, hemoglobin in the 11s, she has had normal iron studies and historically has had numerous normal iron panels over the years.  Prior work-up: Barium swallow - 04/04/19: IMPRESSION: No evidence of fixed stricture, mass or mucosal abnormality. Intermittent prominent esophageal dysmotility with tertiary contractions. Small sliding hiatal hernia.   EGD 12/27/2017 - 1cm HH, normal esophagus, patchy erythema of  the stomach, otherwise normal - biopsies of esophagus normal as were gastric biopsies  CT chest 11/12/2017 -tiny pulmonary nodules, follow up in 12 months. Aortic atherosclerosis, 1cm left adrenal adenoma  Cardiac CT - 03/07/2017 - dilated pulmonary artery, otherwise no significant coronary calcium Echocardiogram 03/17/2017 - 55-60% Negative nuclear stress 04/20/18  [place] RUQ 04/20/2017 - normal  Cologuard - 05/08/2017 - negative   Past Medical History:  Diagnosis Date  . Acute blood loss as cause of  postoperative anemia   . Anemia 01/22/2019  . Chest pain    a. prior coronary CT without CAD.  Marland Kitchen Elevated sed rate 01/22/2019  . Esophageal dysmotility   . Essential hypertension 07/08/2016  . GERD (gastroesophageal reflux disease)   . MGUS (monoclonal gammopathy of unknown significance)   . Numbness and tingling   . PAD (peripheral artery disease) ([place])    a. 05/2018: PV angio subacute thrombotic occlusion of her popliteal artery. She underwent successful penumbra aspiration thrombectomy, PTA and self-expanding stenting using overlapping [place] self-expanding stents of a long thrombotic occlusion of the popliteal, anterior tibial and tibioperoneal trunk. Procedure c/b pseudoaneurysm/ABL anemia.  . 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[place])   . Uterine fibroid   . Vitamin D deficiency      Past Surgical History:  Procedure Laterality Date  . ABDOMINAL AORTOGRAM W/[name] EXTREMITY Right 05/08/2018   Procedure: ABDOMINAL AORTOGRAM W/[name] EXTREMITY;  Surgeon: Lorretta Harp, MD;  Location: [facility];  Service: Cardiovascular;  Laterality: Right;  . [name] EXTREMITY ANGIOGRAPHY  07/12/2019   Procedure: [name] Extremity Angiography;  Surgeon: Lorretta Harp, MD;  Location: [facility];  Service: Cardiovascular;;  . lump removed from breast at age 54    . PERIPHERAL VASCULAR BALLOON ANGIOPLASTY  07/12/2019   Procedure: PERIPHERAL VASCULAR BALLOON ANGIOPLASTY;  Surgeon: Lorretta Harp, MD;  Location: [facility];  Service: Cardiovascular;;  . PERIPHERAL VASCULAR INTERVENTION Right 05/08/2018   Procedure: PERIPHERAL VASCULAR INTERVENTION;  Surgeon: Lorretta Harp, MD;  Location: [facility];  Service: Cardiovascular;  Laterality: Right;  Anterior tibial and popliteal stents  . PERIPHERAL VASCULAR THROMBECTOMY Right 05/08/2018   Procedure: PERIPHERAL VASCULAR THROMBECTOMY;  Surgeon: Lorretta Harp, MD;  Location: [facility];  Service: Cardiovascular;   Laterality: Right;  Popliteal, tibioperoneal trunk, Anterior tibial, Posterior tibial   Family History  Problem Relation Age of Onset  . Hypertension Mother   . Alzheimer's disease Father   . Healthy Son   . Colon polyps Neg Hx   . Crohn's disease Neg Hx   . Rectal cancer Neg Hx   . Stomach cancer Neg Hx   . Pancreatic cancer Neg Hx    Social History   Tobacco Use  . Smoking status: Never Smoker  . Smokeless tobacco: Never Used  Substance Use Topics  . Alcohol use: No  . Drug use: No   Current Outpatient Medications  Medication Sig Dispense Refill  . atorvastatin (LIPITOR) 80 MG tablet Take 1 tablet (80 mg total) by mouth daily. (Patient taking differently: Take 80 mg by mouth daily in the afternoon. ) 90 tablet 2  . carvedilol (COREG) 6.25 MG tablet Take 1 tablet (6.25 mg total) by mouth 2 (two) times daily. 180 tablet 3  . Cholecalciferol (VITAMIN D) 50 MCG (2000 UT) tablet Take 2,000 Units by mouth daily in the afternoon.    . clopidogrel (PLAVIX) 75 MG tablet Take 1 tablet (75 mg total) by mouth daily. 90 tablet 3  .  ELIQUIS 5 MG TABS tablet Take 1 tablet by mouth twice daily (Patient taking differently: Take 5 mg by mouth 2 (two) times daily. ) 60 tablet 6  . ergocalciferol (VITAMIN D2) 1.25 MG (50000 UT) capsule Take 50,000 Units by mouth every Wednesday. Afternoon    . hydrochlorothiazide (MICROZIDE) 12.5 MG capsule Take 1 capsule (12.5 mg total) by mouth daily. 90 capsule 3  . megestrol (MEGACE) 40 MG tablet Take 1 tablet (40 mg total) by mouth 2 (two) times daily. (Patient taking differently: Take 40 mg by mouth daily in the afternoon. ) 180 tablet 3  . potassium chloride (KLOR-CON) 10 MEQ tablet Take 1 tablet (10 mEq total) by mouth 3 (three) times a week. (Patient taking differently: Take 10 mEq by mouth every Monday, Wednesday, and Friday. In the afternoon) 90 tablet 0  . valsartan (DIOVAN) 160 MG tablet Take 1 tablet (160 mg total) by mouth daily. TO REPLACE LOSARTAN 90  tablet 3  . vitamin B-12 (CYANOCOBALAMIN) 1000 MCG tablet Take 1,000 mcg by mouth daily in the afternoon.      No current facility-administered medications for this visit.   Allergies  Allergen Reactions  . Lisinopril Cough     Review of Systems: All systems reviewed and negative except where noted in HPI.    VAS [place] ABI WITH/WO TBI  Result Date: 08/02/2019 [name] EXTREMITY DOPPLER STUDY Indications: S/P right popliteal stent angioplasty on 07/12/18. Patient says she              is able to walk longer without any claudication symptoms since the              procedure. High Risk Factors: Hypertension, hyperlipidemia, no history of smoking.  Vascular Interventions: S/p chocolate balloon angioplasty followed by                         drug-coated balloon angioplasty of the right anterior                         tibial and popliteal arteries for right popliteal stent                         in-stent restenosis and recurrent claudication symptoms                         on 07/12/18. Comparison Study: Previous ABIs on 06/13/18 were 0.91 on the right and 1.03 on                   the left. Performing Technologist: Mariane Masters RVT  Examination Guidelines: A complete evaluation includes at minimum, Doppler waveform signals and systolic blood pressure reading at the level of bilateral brachial, anterior tibial, and posterior tibial arteries, when vessel segments are accessible. Bilateral testing is considered an integral part of a complete examination. Photoelectric Plethysmograph (PPG) waveforms and toe systolic pressure readings are included as required and additional duplex testing as needed. Limited examinations for reoccurring indications may be performed as noted.  ABI Findings: +---------+------------------+-----+---------+--------+ Right    Rt Pressure (mmHg)IndexWaveform Comment  +---------+------------------+-----+---------+--------+ Brachial 143                                       +---------+------------------+-----+---------+--------+ ATA      158  1.10 triphasic         +---------+------------------+-----+---------+--------+ PTA      143               1.00 triphasic         +---------+------------------+-----+---------+--------+ PERO     157               1.10 biphasic          +---------+------------------+-----+---------+--------+ Great Toe122               0.85 Abnormal          +---------+------------------+-----+---------+--------+ +---------+------------------+-----+---------+-------+ Left     Lt Pressure (mmHg)IndexWaveform Comment +---------+------------------+-----+---------+-------+ Brachial 134                                     +---------+------------------+-----+---------+-------+ ATA      170               1.19 triphasic        +---------+------------------+-----+---------+-------+ PTA      135               0.94 biphasic         +---------+------------------+-----+---------+-------+ PERO     138               0.97 biphasic         +---------+------------------+-----+---------+-------+ Gardiner Rhyme               0.83 Normal           +---------+------------------+-----+---------+-------+ +-------+-----------+-----------+------------+------------+ ABI/TBIToday's ABIToday's TBIPrevious ABIPrevious TBI +-------+-----------+-----------+------------+------------+ Right  1.10       0.85       0.91        0.39         +-------+-----------+-----------+------------+------------+ Left   1.19       0.83       1.03        0.76         +-------+-----------+-----------+------------+------------+ Right ABIs appear increased compared to prior study on 06/13/18. Left ABIs appear essentially unchanged compared to prior study on 06/13/18.  Summary: Right: Resting right ankle-brachial index is within normal range. No evidence of significant right [name] extremity arterial disease. The right toe-brachial index  is normal. Left: Resting left ankle-brachial index is within normal range. No evidence of significant left [name] extremity arterial disease. The left toe-brachial index is normal.  *See table(s) above for measurements and observations. See arterial duplex report.  Suggest follow up study in 6 months. Electronically signed by Jenkins Rouge MD on 08/02/2019 at 8:02:12 AM.    Final    VAS [place] [name] EXTREMITY ARTERIAL DUPLEX  Result Date: 08/02/2019 [name] EXTREMITY ARTERIAL DUPLEX STUDY Indications: S/P right popliteal stent angioplasty on 07/12/18. Patient says she              is able to walk longer without any claudication symptoms since the              procedure. High Risk Factors: Hypertension, hyperlipidemia, no history of smoking.  Vascular Interventions: S/P chocolate balloon angioplasty followed by                         drug-coated balloon angioplasty of the right anterior                         tibial  artery and popliteal arteries for right popliteal                         in-stent restenosis and recurrent claudication on                         07/12/18. Current ABI:            Today's ABIs are 1.10 on the right and 1.19 on the left. Limitations: Body habitus Comparison Study: Previous arterial duplex 06/13/18 showed PSV of 318 cm/sec in                   the mid stent (50-99% stenosis). Performing Technologist: Mariane Masters RVT  Examination Guidelines: A complete evaluation includes B-mode imaging, spectral Doppler, color Doppler, and power Doppler as needed of all accessible portions of each vessel. Bilateral testing is considered an integral part of a complete examination. Limited examinations for reoccurring indications may be performed as noted.  +----------+--------+-----+--------+---------+--------+ RIGHT     PSV cm/sRatioStenosisWaveform Comments +----------+--------+-----+--------+---------+--------+ CFA Prox  128                  triphasic          +----------+--------+-----+--------+---------+--------+ DFA       67                   biphasic          +----------+--------+-----+--------+---------+--------+ SFA Prox  96                   triphasic         +----------+--------+-----+--------+---------+--------+ SFA Mid   92                   triphasic         +----------+--------+-----+--------+---------+--------+ SFA Distal105                  triphasic         +----------+--------+-----+--------+---------+--------+ TP Trunk  109                  triphasic         +----------+--------+-----+--------+---------+--------+ ATA Prox  81                   triphasic         +----------+--------+-----+--------+---------+--------+ ATA Mid   60                   triphasic         +----------+--------+-----+--------+---------+--------+ ATA Distal78                   triphasic         +----------+--------+-----+--------+---------+--------+  Right Stent(s): +----------------+--------+--------+---------+--------+ popliteal arteryPSV cm/sStenosisWaveform Comments +----------------+--------+--------+---------+--------+ Prox to Stent   70              triphasic         +----------------+--------+--------+---------+--------+ Proximal Stent  113             triphasic         +----------------+--------+--------+---------+--------+ Mid Stent       105             triphasic         +----------------+--------+--------+---------+--------+ Distal Stent    110             triphasic         +----------------+--------+--------+---------+--------+ Distal  to Stent 137             triphasic         +----------------+--------+--------+---------+--------+     Summary: Right: Patent right popliteal stent and anterior tibial artery without evidence of stenosis, s/p angioplasty. Marked improvement is noted compared to previous study.  See table(s) above for measurements and observations. See ABI report.  Suggest follow up study in 6 months. Electronically signed by Jenkins Rouge MD on 08/02/2019 at 8:04:59 AM.    Final    Lab Results  Component Value Date   WBC 8.8 07/13/2019   HGB 11.3 (L) 07/13/2019   HCT 34.0 (L) 07/13/2019   MCV 94.2 07/13/2019   PLT 225 07/13/2019    Lab Results  Component Value Date   CREATININE 0.89 07/13/2019   BUN 13 07/13/2019   NA 138 07/13/2019   K 3.9 07/13/2019   CL 104 07/13/2019   CO2 22 07/13/2019    Lab Results  Component Value Date   ALT 15 03/02/2019   AST 14 (L) 03/02/2019   ALKPHOS 89 03/02/2019   BILITOT 0.8 03/02/2019    Lab Results  Component Value Date   IRON 88 01/22/2019   TIBC 328 01/22/2019   FERRITIN 84 08/20/2019     Physical Exam: Ht 5\' 5"  (1.651 m)   Wt 280 lb (127 kg)   BMI 46.59 kg/m  Constitutional: Pleasant,well-developed, female in no acute distress. Psychiatric: Normal mood and affect. Behavior is normal.   ASSESSMENT AND PLAN: 54 year old female here for reassessment of the following:  Atypical chest pain / esophageal dysmotility - prior work-up as outlined above, I think her symptoms are probably due to dysmotility and esophageal spasm.  We discussed what these entities are.  We have discussed consideration of esophageal manometry and pH test to more objectively evaluate however I do not think it will probably change management at this time.  Her barium study shows some evidence of nonspecific dysmotility, no obvious evidence of achalasia.  We discussed how much this bothers her and how aggressive she wants to be with management.  She states her symptoms are pretty minimal at this time if she chews her food well.  If she has frequent symptoms that bother her and impair her quality of life, I would consider a trial of a TCA or diltiazem.  Given she has been doing better and symptoms are minimal at this time she wants to hold off.  In regards to possible manometry she also want to hold off on that, she is concerned  about her ability to tolerate that exam.  She will contact me in the future if she wishes to have treatment for this at some point time pending her course.  All questions answered she agreed.  I reassured her results of barium study and EGD showed no concerning lesions in her esophagus otherwise.  Family history of colon cancer / antiplatelet use / anticoagulated - in light of her family history of colon cancer she warrants optical colonoscopy for screening, the question is timing of the exam in light of her antiplatelet and anticoagulant use.  She has no alarm symptoms of her [name] GI tract and iron studies are normal which are reassuring in conjunction with normal Cologuard in 2019.  That being said with her family history I do agree with colonoscopy when she is cleared to do so.  Given her recent restenting of her leg, I imagine she will not be able to stop her Plavix  for the next several months but will need to reach out to Dr. Gwenlyn Found and clarify when would be the earliest time she could hold the Plavix so she can plan timing of her colonoscopy.  We will let her know when we hear back from them.  Otherwise she will be mindful of bowel symptoms let me know if anything changes in the interim.  I spent 30 minutes of time, including in depth chart review, independent review of results as outlined above, communicating results with the patient directly, face-to-face time with the patient, coordinating care, and documenting this encounter.  [place] Cellar, MD [facility]

## 2019-08-21 NOTE — Patient Instructions (Signed)
If you are age 56 or older, your body mass index should be between 23-30. Your Body mass index is 46.59 kg/m. If this is out of the aforementioned range listed, please consider follow up with your Primary Care Provider.  If you are age 53 or younger, your body mass index should be between 19-25. Your Body mass index is 46.59 kg/m. If this is out of the aformentioned range listed, please consider follow up with your Primary Care Provider.   We will reach out to Dr. Gwenlyn Found regarding when you will be able to hold your Eliquis and Plavix in order to have a colonoscopy.  Thank you for entrusting me with your care and for choosing [facility], Dr. [place] Cellar

## 2019-08-22 ENCOUNTER — Ambulatory Visit: Payer: BC Managed Care – PPO | Admitting: Cardiovascular Disease

## 2019-08-22 ENCOUNTER — Telehealth: Payer: Self-pay

## 2019-08-22 NOTE — Telephone Encounter (Signed)
Called pt.  Relayed results and recommendations.  Patient expressed understanding. Reminder placed to call her in 6 months to schedule Colon in the [place].  Dr. Havery Moros, will we need to get clearance to hold Plavix for 5 days and Eliquis for two days at that time? Thank you.

## 2019-08-22 NOTE — Telephone Encounter (Signed)
-----   Message from Yetta Flock, MD sent at 08/21/2019  5:53 PM EDT ----- Regarding: FW: mutual patient Jan can you please let the patient know that she will not be eligible for colonoscopy for another 6 months, as she can't stop Plavix earlier than that. Can you please place recall colonoscopy for 6 months and she can contact us at that time for scheduling. Thanks ----- Message ----- From: Lorretta Harp, MD Sent: 08/21/2019   3:21 PM EDT To: Yetta Flock, MD Subject: RE: mutual patient                             Christina [name], I would feel comfortable with her having uninterrupted antiplatelet therapy for 6 months from her recent intervention.  She really only has one-vessel runoff to her foot and affect stent thrombosis she will be in huge vascular jeopardy.  I think 6 months should be adequate for reendothelialization. ----- Message ----- From: Yetta Flock, MD Sent: 08/21/2019   8:56 AM EDT To: Lorretta Harp, MD Subject: mutual patient                                 [facility] Hope you are doing well. I just wanted to touch base regarding this mutual patient. I know you just restented her leg and she is on Plavix and Eliquis. She is anxious about having a colonoscopy as her mother had colon cancer, although understand she may not be able to hold the Plavix for some time given the recent stenting. I was curious when she may be able to hold the Plavix for a colonoscopy, assuming she continues to do well with her leg, just so she can plan. Thanks!  Christina [name]

## 2019-08-22 NOTE — Telephone Encounter (Signed)
Thanks Jan. Yes at that time we will need clearance to hold the Plavix and Eliquis. Thanks

## 2019-08-29 ENCOUNTER — Telehealth: Payer: BC Managed Care – PPO | Admitting: Rheumatology

## 2019-08-30 ENCOUNTER — Other Ambulatory Visit: Payer: Self-pay | Admitting: Cardiovascular Disease

## 2019-08-30 DIAGNOSIS — I739 Peripheral vascular disease, unspecified: Secondary | ICD-10-CM

## 2019-08-31 ENCOUNTER — Other Ambulatory Visit: Payer: BC Managed Care – PPO

## 2019-08-31 ENCOUNTER — Ambulatory Visit: Payer: BC Managed Care – PPO | Admitting: Hematology

## 2019-08-31 NOTE — Progress Notes (Signed)
Virtual Visit via Telephone Note  I connected with Christina [name] on 09/07/19 at  1:00 PM EDT by telephone and verified that I am speaking with the correct person using two identifiers.  Location: Patient: Home  Provider: Clinic  This service was conducted via virtual visit.   The patient was located at home. I was located in my office.  Consent was obtained prior to the virtual visit and is aware of possible charges through their insurance for this visit.  The patient is an established patient.  Dr. Estanislado Pandy, MD conducted the virtual visit and [name], PA-C acted as scribe during the service.  Office staff helped with scheduling follow up visits after the service was conducted.     I discussed the limitations, risks, security and privacy concerns of performing an evaluation and management service by telephone and the availability of in person appointments. I also discussed with the patient that there may be a patient responsible charge related to this service. The patient expressed understanding and agreed to proceed.  CC: Discussed recent lab work History of Present Illness: Patient is a 54 year old female with a past medical history of positive ANA and osteoarthritis.  She denies any new signs or symptoms of autoimmune disease.  She has not had any recent rashes, photosensitivity, symptoms of Raynaud's, oral or nasal ulcerations, sicca symptoms, increased joint pain or joint swelling. She denies any new concerns. We reviewed AVISE lab work today.    Review of Systems  Constitutional: Negative for fever and malaise/fatigue.  HENT: Negative for congestion.   Eyes: Negative for photophobia, pain, discharge and redness.  Respiratory: Negative for cough, shortness of breath and wheezing.   Cardiovascular: Negative for chest pain, palpitations and leg swelling.  Gastrointestinal: Negative for blood in stool, constipation and diarrhea.  Genitourinary: Negative for dysuria and urgency.   Musculoskeletal: Negative for back pain, joint pain, myalgias and neck pain.  Skin: Negative for rash.  Neurological: Negative for dizziness, weakness and headaches.  Endo/Heme/Allergies: Does not bruise/bleed easily.  Psychiatric/Behavioral: Negative for depression and memory loss. The patient is not nervous/anxious and does not have insomnia.       Observations/Objective: Physical Exam  Constitutional: She is oriented to person, place, and time.  Neurological: She is alert and oriented to person, place, and time.  Psychiatric: Mood, memory, affect and judgment normal.   Patient reports morning stiffness for 0 NONE.   Patient denies nocturnal pain.  Difficulty dressing/grooming: Denies Difficulty climbing stairs: Denies Difficulty getting out of chair: Denies Difficulty using hands for taps, buttons, cutlery, and/or writing: Denies   Assessment and Plan: Visit Diagnoses:Positive ANA (antinuclear antibody)-07/07/19: AVISE negative index -2.0, ANA 1: 160 homogenous, ENA negative, CB CAP negative, RF IgM 9.2. Her ANA titer is low which is not significant.  We reviewed AVISE lab work in detail and all questions were addressed. Patient has no clinical features of autoimmune disease. She has not had any recent rashes, photosensitivity, hair loss, symptoms of Raynaud's, oral or nasal ulcerations, sicca symptoms, chest pain, or shortness of breath.  She was advised to notify us if she develops any new or worsening symptoms.  She would like to repeat AVISE lab work in 6 months and schedule a follow-up once she has received the results.  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   MGUS (monoclonal gammopathy of unknown significance): She underwent a thorough workup with hem/onc and was diagnosed with MGUS.  She has upcoming lab  work.  Numbness and tingling of upper and [name] extremities of both sides: Resolved  Primary osteoarthritis of right knee-She  is not having any discomfort in her knee joint at this time.  She denies any joint swelling.  She has not had any difficulty performing ADLs due to the discomfort.  DDD (degenerative disc disease), cervical-She has no neck pain or stiffness.  No symptoms of radiculopathy.   Other medical conditions are listed as follows:   Critical limb ischemia with history of revascularization of same extremity  Peripheral arterial disease ([place])  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[place])  Vitamin D deficiency  Essential hypertension  Pre-diabetes  Follow Up Instructions: She will follow up in 6 months   I discussed the assessment and treatment plan with the patient. The patient was provided an opportunity to ask questions and all were answered. The patient agreed with the plan and demonstrated an understanding of the instructions.   The patient was advised to call back or seek an in-person evaluation if the symptoms worsen or if the condition fails to improve as anticipated.  I provided 20 minutes of non-face-to-face time during this encounter.   Bo Merino, MD   Scribed by-  [name], PA-C

## 2019-09-02 ENCOUNTER — Other Ambulatory Visit: Payer: Self-pay | Admitting: Cardiovascular Disease

## 2019-09-03 NOTE — Telephone Encounter (Signed)
We have only seen this patient in our office once for paresthesia in upper and lower extremities. The head pain is a new onset problem that has never been evaluated. She is going to see her PCP for the initial exam and will ask for a referral here, if needed.

## 2019-09-03 NOTE — Telephone Encounter (Signed)
Rx(s) sent to pharmacy electronically.  

## 2019-09-05 ENCOUNTER — Other Ambulatory Visit (HOSPITAL_BASED_OUTPATIENT_CLINIC_OR_DEPARTMENT_OTHER): Payer: Self-pay | Admitting: Family Medicine

## 2019-09-05 DIAGNOSIS — Z1231 Encounter for screening mammogram for malignant neoplasm of breast: Secondary | ICD-10-CM

## 2019-09-07 ENCOUNTER — Telehealth (INDEPENDENT_AMBULATORY_CARE_PROVIDER_SITE_OTHER): Payer: BC Managed Care – PPO | Admitting: Rheumatology

## 2019-09-07 ENCOUNTER — Ambulatory Visit (INDEPENDENT_AMBULATORY_CARE_PROVIDER_SITE_OTHER): Payer: BC Managed Care – PPO | Admitting: Cardiovascular Disease

## 2019-09-07 ENCOUNTER — Other Ambulatory Visit: Payer: Self-pay

## 2019-09-07 ENCOUNTER — Encounter: Payer: Self-pay | Admitting: Rheumatology

## 2019-09-07 ENCOUNTER — Encounter: Payer: Self-pay | Admitting: Cardiovascular Disease

## 2019-09-07 VITALS — Ht 65.0 in

## 2019-09-07 VITALS — BP 120/68 | HR 97 | Ht 65.0 in | Wt 285.0 lb

## 2019-09-07 DIAGNOSIS — R700 Elevated erythrocyte sedimentation rate: Secondary | ICD-10-CM | POA: Diagnosis not present

## 2019-09-07 DIAGNOSIS — D472 Monoclonal gammopathy: Secondary | ICD-10-CM

## 2019-09-07 DIAGNOSIS — R778 Other specified abnormalities of plasma proteins: Secondary | ICD-10-CM

## 2019-09-07 DIAGNOSIS — I10 Essential (primary) hypertension: Secondary | ICD-10-CM

## 2019-09-07 DIAGNOSIS — T81718A Complication of other artery following a procedure, not elsewhere classified, initial encounter: Secondary | ICD-10-CM

## 2019-09-07 DIAGNOSIS — M5030 Other cervical disc degeneration, unspecified cervical region: Secondary | ICD-10-CM

## 2019-09-07 DIAGNOSIS — I729 Aneurysm of unspecified site: Secondary | ICD-10-CM

## 2019-09-07 DIAGNOSIS — I70229 Atherosclerosis of native arteries of extremities with rest pain, unspecified extremity: Secondary | ICD-10-CM

## 2019-09-07 DIAGNOSIS — M1711 Unilateral primary osteoarthritis, right knee: Secondary | ICD-10-CM | POA: Diagnosis not present

## 2019-09-07 DIAGNOSIS — R768 Other specified abnormal immunological findings in serum: Secondary | ICD-10-CM | POA: Diagnosis not present

## 2019-09-07 DIAGNOSIS — Z959 Presence of cardiac and vascular implant and graft, unspecified: Secondary | ICD-10-CM

## 2019-09-07 DIAGNOSIS — Z9889 Other specified postprocedural states: Secondary | ICD-10-CM

## 2019-09-07 DIAGNOSIS — E559 Vitamin D deficiency, unspecified: Secondary | ICD-10-CM

## 2019-09-07 DIAGNOSIS — R7303 Prediabetes: Secondary | ICD-10-CM

## 2019-09-07 DIAGNOSIS — I998 Other disorder of circulatory system: Secondary | ICD-10-CM

## 2019-09-07 DIAGNOSIS — I739 Peripheral vascular disease, unspecified: Secondary | ICD-10-CM

## 2019-09-07 MED ORDER — AMLODIPINE BESYLATE 2.5 MG PO TABS: 2.5000 mg | ORAL_TABLET | Freq: Every day | ORAL | 3 refills | Status: DC

## 2019-09-07 NOTE — Progress Notes (Signed)
Christina [name] returns today for follow-up.  She is complaining of some bruising on her body with a small subcentimeter nodule on her left lateral thigh.  We discussed the balance between bleeding and thrombosis.  She does have stents in her right popliteal artery and tibioperoneal trunk and has hypercoagulable state on Eliquis.  She also complains of chronic chest pain going into her neck and jaw.  She had a negative Myoview 04/20/2018 and a coronary CTA performed 03/04/2017 revealed a coronary calcium score of 0 with no evidence of coronary artery disease.  She may have coronary vasospasm or microvascular angina versus noncardiac cause of chest pain.  We will go to begin her on amlodipine 2.5 mg and have her see an APP back in 4 weeks.  I will see her back in 6 months.  Her exam is benign today.  Lorretta Harp, M.D., [place], [facility], [place] [address] [place], [place]  [address]  [phone] 09/07/2019 5:10 PM

## 2019-09-07 NOTE — Patient Instructions (Signed)
Medication Instructions:  START AMLODIPINE 2.5 MG ONCE DAILY  *If you need a refill on your cardiac medications before your next appointment, please call your pharmacy*   Lab Work: If you have labs (blood work) drawn today and your tests are completely normal, you will receive your results only by: Marland Kitchen MyChart Message (if you have MyChart) OR . A paper copy in the mail If you have any lab test that is abnormal or we need to change your treatment, we will call you to review the results   Follow-Up: At [facility], you and your health needs are our priority.  As part of our continuing mission to provide you with exceptional heart care, we have created designated Provider Care Teams.  These Care Teams include your primary Cardiologist (physician) and Advanced Practice Providers (APPs -  Physician Assistants and Nurse Practitioners) who all work together to provide you with the care you need, when you need it.  We recommend signing up for the patient portal called "MyChart".  Sign up information is provided on this After Visit Summary.  MyChart is used to connect with patients for Virtual Visits (Telemedicine).  Patients are able to view lab/test results, encounter notes, upcoming appointments, etc.  Non-urgent messages can be sent to your provider as well.   To learn more about what you can do with MyChart, go to [email or web address].    Your next appointment:    Your physician recommends that you schedule a follow-up appointment in: [place] wants you to follow-up in: [place] will receive a reminder letter in the mail two months in advance. If you don't receive a letter, please call our office to schedule the follow-up appointment.

## 2019-09-12 ENCOUNTER — Encounter (INDEPENDENT_AMBULATORY_CARE_PROVIDER_SITE_OTHER): Payer: BC Managed Care – PPO | Admitting: Neurology

## 2019-09-12 ENCOUNTER — Ambulatory Visit (INDEPENDENT_AMBULATORY_CARE_PROVIDER_SITE_OTHER): Payer: BC Managed Care – PPO | Admitting: Neurology

## 2019-09-12 ENCOUNTER — Other Ambulatory Visit: Payer: Self-pay

## 2019-09-12 DIAGNOSIS — Z0289 Encounter for other administrative examinations: Secondary | ICD-10-CM

## 2019-09-12 DIAGNOSIS — R202 Paresthesia of skin: Secondary | ICD-10-CM

## 2019-09-12 NOTE — Procedures (Signed)
Full Name: Christina [name] Gender: Female MRN #: [id number] Date of Birth: 1966-04-15    Visit Date: 09/12/2019 07:29 Age: 54 Years Examining Physician: Marcial Pacas, MD  Referring Physician: Marcial Pacas, MD Height: 5 feet 5 inch History: 54 year old female presented with intermittent bilateral hands and feet paresthesia,  Summary of the tests: Nerve conduction study: Right sural, superficial peroneal sensory responses were normal.  Left sural, superficial peroneal sensory response was absent, this is limited by technical difficulty, she also suffered left leg injury as a child, there was a well-healed deep scar above left medial ankle.  Left peroneal motor responses were normal, left tibial motor most response showed mild decreased CMAP amplitude.  Bilateral ulnar sensory and motor responses were normal.  Electromyography: Selected needle examination of left upper, lower extremity muscles, left cervical, lumbar sacral paraspinal muscles were normal.  Conclusion: This is a essentially normal study.  There is no electrodiagnostic evidence of large fiber peripheral neuropathy, left cervical radiculopathy or lumbar radiculopathy.    ------------------------------- Marcial Pacas, M.D. PhD  [facility] [address], [place] [address] Tel: [phone] Fax: [phone]  Verbal informed consent was obtained from the patient, patient was informed of potential risk of procedure, including bruising, bleeding, hematoma formation, infection, muscle weakness, muscle pain, numbness, among others.         [place]    Nerve / Sites Muscle Latency Ref. Amplitude Ref. Rel Amp Segments Distance Velocity Ref. Area    ms ms mV mV %  cm m/s m/s mVms  R Median - APB     Wrist APB 3.7 ?4.4 5.5 ?4.0 100 Wrist - APB 7   21.5     Upper arm APB 7.5  5.2  93.9 Upper arm - Wrist 22 58 ?49 20.7  L Median - APB     Wrist APB 3.0 ?4.4 5.4 ?4.0 100 Wrist - APB 7   23.0     Upper arm APB  6.8  5.5  102 Upper arm - Wrist 21 57 ?49 22.0  R Ulnar - ADM     Wrist ADM 2.4 ?3.3 9.4 ?6.0 100 Wrist - ADM 7   26.3     B.Elbow ADM 6.0  8.4  90 B.Elbow - Wrist 19 53 ?49 26.2     A.Elbow ADM 7.9  7.9  93.6 A.Elbow - B.Elbow 10 52 ?49 26.5         A.Elbow - Wrist      L Ulnar - ADM     Wrist ADM 2.3 ?3.3 6.0 ?6.0 100 Wrist - ADM 7   19.7     B.Elbow ADM 6.0  4.6  77.2 B.Elbow - Wrist 20 53 ?49 18.7     A.Elbow ADM 8.0  4.3  93.7 A.Elbow - B.Elbow 10 50 ?49 17.9         A.Elbow - Wrist      L Peroneal - EDB     Ankle EDB 3.0 ?6.5 8.3 ?2.0 100 Ankle - EDB 9   27.8     Fib head EDB NR  NR  NR Fib head - Ankle 27 NR ?44 NR     Pop fossa EDB 11.5  7.3   Pop fossa - Fib head 10 NR ?44 23.5         Pop fossa - Ankle      L Tibial - AH     Ankle AH 4.2 ?5.8 2.3 ?4.0 100 Ankle -  AH 9   4.5     Pop fossa AH 13.8  0.1  2.75 Pop fossa - Ankle 37 38 ?41 0.4                    SNC    Nerve / Sites Rec. Site Peak Lat Ref.  Amp Ref. Segments Distance Peak Diff Ref.    ms ms V V  cm ms ms  L Sural - Ankle (Calf)     Calf Ankle NR ?4.4 NR ?6 Calf - Ankle 14    R Sural - Ankle (Calf)     Calf Ankle 3.1 ?4.4 8 ?6 Calf - Ankle 14    L Superficial peroneal - Ankle     Lat leg Ankle NR ?4.4 NR ?6 Lat leg - Ankle 14    R Superficial peroneal - Ankle     Lat leg Ankle 3.0 ?4.4 6 ?6 Lat leg - Ankle 14    R Median, Ulnar - Transcarpal comparison     Median Palm Wrist 2.3 ?2.2 67 ?35 Median Palm - Wrist 8       Ulnar Palm Wrist 2.0 ?2.2 12 ?12 Ulnar Palm - Wrist 8          Median Palm - Ulnar Palm  0.3 ?0.4  R Median - Orthodromic (Dig II, Mid palm)     Dig II Wrist 3.2 ?3.4 13 ?10 Dig II - Wrist 13    L Median - Orthodromic (Dig II, Mid palm)     Dig II Wrist 2.8 ?3.4 13 ?10 Dig II - Wrist 13    R Ulnar - Orthodromic, (Dig V, Mid palm)     Dig V Wrist 2.7 ?3.1 8 ?5 Dig V - Wrist 11    L Ulnar - Orthodromic, (Dig V, Mid palm)     Dig V Wrist 2.6 ?3.1 8 ?5 Dig V - Wrist 20                          F  Wave    Nerve F Lat Ref.   ms ms  R Ulnar - ADM 28.3 ?32.0  L Ulnar - ADM 27.9 ?32.0  L Tibial - AH 53.2 ?56.0           EMG Summary Table    Spontaneous MUAP Recruitment  Muscle IA Fib PSW Fasc Other Amp Dur. Poly Pattern  L. Tibialis anterior Normal None None None _______ Normal Normal Normal Normal  L. Tibialis posterior Normal None None None _______ Normal Normal Normal Normal  L. Peroneus longus Normal None None None _______ Normal Normal Normal Normal  L. Gastrocnemius (Medial head) Normal None None None _______ Normal Normal Normal Normal  L. Vastus lateralis Normal None None None _______ Normal Normal Normal Normal  L. Lumbar paraspinals (mid) Normal None None None _______ Normal Normal Normal Normal  L. Lumbar paraspinals (low) Normal None None None _______ Normal Normal Normal Normal  L. First dorsal interosseous Normal None None None _______ Normal Normal Normal Normal  L. Pronator teres Normal None None None _______ Normal Normal Normal Normal  L. Biceps brachii Normal None None None _______ Normal Normal Normal Normal  L. Deltoid Normal None None None _______ Normal Normal Normal Normal  L. Triceps brachii Normal None None None _______ Normal Normal Normal Normal  L. Cervical paraspinals Normal None None None _______ Normal Normal Normal Normal

## 2019-09-19 ENCOUNTER — Other Ambulatory Visit: Payer: Self-pay

## 2019-09-20 ENCOUNTER — Ambulatory Visit (INDEPENDENT_AMBULATORY_CARE_PROVIDER_SITE_OTHER): Payer: BC Managed Care – PPO | Admitting: Family Medicine

## 2019-09-20 ENCOUNTER — Encounter: Payer: Self-pay | Admitting: Family Medicine

## 2019-09-20 ENCOUNTER — Other Ambulatory Visit: Payer: Self-pay | Admitting: Family Medicine

## 2019-09-20 VITALS — BP 108/64 | HR 93 | Temp 97.8°F | Ht 65.0 in | Wt 288.6 lb

## 2019-09-20 DIAGNOSIS — R7303 Prediabetes: Secondary | ICD-10-CM

## 2019-09-20 DIAGNOSIS — E7800 Pure hypercholesterolemia, unspecified: Secondary | ICD-10-CM

## 2019-09-20 DIAGNOSIS — E559 Vitamin D deficiency, unspecified: Secondary | ICD-10-CM

## 2019-09-20 MED ORDER — METFORMIN HCL ER 500 MG PO TB24: 500.0000 mg | ORAL_TABLET | Freq: Every day | ORAL | 1 refills | Status: DC

## 2019-09-20 NOTE — Patient Instructions (Signed)

## 2019-09-20 NOTE — Progress Notes (Signed)
Established Patient Office Visit  Subjective:  Patient ID: Christina [name], female    DOB: 1966/01/14  Age: 54 y.o. MRN: [id number]  CC:  Chief Complaint  Patient presents with  . Annual Exam    CPE, no concerns    HPI Christina [name] presents for medical check and follow-up of her vitamin D deficiency.  Health maintenance is up-to-date.  Sees GYN for female checks.  Under surveillance for MAGUS with hematology.  Cardiology is following blood pressure and peripheral vascular disease.  She has gained some weight.  Recent blood work obtained by neurologist did show normocytic anemia.  Ferritin levels were normal B12 levels normal she is supplementing with B12.  Continues high-dose weekly vitamin D 50,000 units.  Continues to work from home.  Past Medical History:  Diagnosis Date  . Acute blood loss as cause of postoperative anemia   . Anemia 01/22/2019  . Chest pain    a. prior coronary CT without CAD.  Marland Kitchen Elevated sed rate 01/22/2019  . Esophageal dysmotility   . Essential hypertension 07/08/2016  . GERD (gastroesophageal reflux disease)   . MGUS (monoclonal gammopathy of unknown significance)   . Numbness and tingling   . PAD (peripheral artery disease) ([place])    a. 05/2018: PV angio subacute thrombotic occlusion of her popliteal artery. She underwent successful penumbra aspiration thrombectomy, PTA and self-expanding stenting using overlapping Tigris self-expanding stents of a long thrombotic occlusion of the popliteal, anterior tibial and tibioperoneal trunk. Procedure c/b pseudoaneurysm/ABL anemia.  . 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[place])   . Uterine fibroid   . Vitamin D deficiency     Past Surgical History:  Procedure Laterality Date  . ABDOMINAL AORTOGRAM W/LOWER EXTREMITY Right 05/08/2018   Procedure: ABDOMINAL AORTOGRAM W/LOWER EXTREMITY;  Surgeon: Lorretta Harp, MD;  Location: [place] [facility];  Service: Cardiovascular;  Laterality: Right;  . LOWER EXTREMITY ANGIOGRAPHY   07/12/2019   Procedure: Lower Extremity Angiography;  Surgeon: Lorretta Harp, MD;  Location: [facility] [facility];  Service: Cardiovascular;;  . lump removed from breast at age 87    . PERIPHERAL VASCULAR BALLOON ANGIOPLASTY  07/12/2019   Procedure: PERIPHERAL VASCULAR BALLOON ANGIOPLASTY;  Surgeon: Lorretta Harp, MD;  Location: [place] [facility];  Service: Cardiovascular;;  . PERIPHERAL VASCULAR INTERVENTION Right 05/08/2018   Procedure: PERIPHERAL VASCULAR INTERVENTION;  Surgeon: Lorretta Harp, MD;  Location: [place] [facility];  Service: Cardiovascular;  Laterality: Right;  Anterior tibial and popliteal stents  . PERIPHERAL VASCULAR THROMBECTOMY Right 05/08/2018   Procedure: PERIPHERAL VASCULAR THROMBECTOMY;  Surgeon: Lorretta Harp, MD;  Location: [place] [facility];  Service: Cardiovascular;  Laterality: Right;  Popliteal, tibioperoneal trunk, Anterior tibial, Posterior tibial    Family History  Problem Relation Age of Onset  . Hypertension Mother   . Alzheimer's disease Father   . Healthy Son   . Colon polyps Neg Hx   . Crohn's disease Neg Hx   . Rectal cancer Neg Hx   . Stomach cancer Neg Hx   . Pancreatic cancer Neg Hx     Social History   Socioeconomic History  . Marital status: Single    Spouse name: Not on file  . Number of children: 1  . Years of education: two years of college  . Highest education level: Not on file  Occupational History  . Not on file  Tobacco Use  . Smoking status: Never Smoker  . Smokeless tobacco: Never Used  Substance and Sexual Activity  .  Alcohol use: No  . Drug use: No  . Sexual activity: Yes  Other Topics Concern  . Not on file  Social History Narrative   Lives alone.   Right-handed.   No daily use of caffeine.   Social Determinants of Health   Financial Resource Strain:   . Difficulty of Paying Living Expenses:   Food Insecurity:   . Worried About Charity fundraiser in the Last Year:   . Arboriculturist in the Last  Year:   Transportation Needs:   . Film/video editor (Medical):   Marland Kitchen Lack of Transportation (Non-Medical):   Physical Activity:   . Days of Exercise per Week:   . Minutes of Exercise per Session:   Stress:   . Feeling of Stress :   Social Connections:   . Frequency of Communication with Friends and Family:   . Frequency of Social Gatherings with Friends and Family:   . Attends Religious Services:   . Active Member of Clubs or Organizations:   . Attends Archivist Meetings:   Marland Kitchen Marital Status:   Intimate Partner Violence:   . Fear of Current or Ex-Partner:   . Emotionally Abused:   Marland Kitchen Physically Abused:   . Sexually Abused:     Outpatient Medications Prior to Visit  Medication Sig Dispense Refill  . amLODipine (NORVASC) 2.5 MG tablet Take 1 tablet (2.5 mg total) by mouth daily. 180 tablet 3  . atorvastatin (LIPITOR) 80 MG tablet Take 1 tablet (80 mg total) by mouth daily. (Patient taking differently: Take 80 mg by mouth daily in the afternoon. ) 90 tablet 2  . carvedilol (COREG) 6.25 MG tablet Take 1 tablet (6.25 mg total) by mouth 2 (two) times daily. 180 tablet 3  . Cholecalciferol (VITAMIN D) 50 MCG (2000 UT) tablet Take 2,000 Units by mouth daily in the afternoon.    . clopidogrel (PLAVIX) 75 MG tablet Take 1 tablet (75 mg total) by mouth daily. 90 tablet 3  . ELIQUIS 5 MG TABS tablet Take 1 tablet by mouth twice daily (Patient taking differently: Take 5 mg by mouth 2 (two) times daily. ) 60 tablet 6  . ergocalciferol (VITAMIN D2) 1.25 MG (50000 UT) capsule Take 50,000 Units by mouth every Wednesday. Afternoon    . hydrochlorothiazide (MICROZIDE) 12.5 MG capsule Take 1 capsule (12.5 mg total) by mouth daily. 90 capsule 3  . megestrol (MEGACE) 40 MG tablet Take 1 tablet (40 mg total) by mouth 2 (two) times daily. (Patient taking differently: Take 40 mg by mouth daily in the afternoon. ) 180 tablet 3  . potassium chloride (KLOR-CON) 10 MEQ tablet Take 1 tablet (10 mEq  total) by mouth 3 (three) times a week. (Patient taking differently: Take 10 mEq by mouth every Monday, Wednesday, and Friday. In the afternoon) 90 tablet 0  . valsartan (DIOVAN) 160 MG tablet Take 1 tablet by mouth once daily 90 tablet 3  . vitamin B-12 (CYANOCOBALAMIN) 1000 MCG tablet Take 1,000 mcg by mouth daily in the afternoon.      No facility-administered medications prior to visit.    Allergies  Allergen Reactions  . Lisinopril Cough    ROS Review of Systems  Constitutional: Negative.   HENT: Negative.   Eyes: Negative for photophobia and visual disturbance.  Respiratory: Negative.   Cardiovascular: Negative.   Gastrointestinal: Negative.   Endocrine: Negative for polyphagia and polyuria.  Genitourinary: Negative.   Musculoskeletal: Negative for gait problem and joint swelling.  Skin: Negative for pallor and rash.  Allergic/Immunologic: Negative for immunocompromised state.  Neurological: Negative for light-headedness and numbness.  Hematological: Does not bruise/bleed easily.  Psychiatric/Behavioral: Negative.        Depression screen [facility] 2/9 09/20/2019 03/09/2019 10/20/2018  Decreased Interest 0 0 0  Down, Depressed, Hopeless 0 0 0  PHQ - 2 Score 0 0 0  Altered sleeping 0 2 3  Tired, decreased energy 0 1 3  Change in appetite 0 0 0  Feeling bad or failure about yourself  0 0 0  Trouble concentrating 0 0 0  Moving slowly or fidgety/restless 0 0 0  Suicidal thoughts 0 0 0  PHQ-9 Score 0 3 6  Some recent data might be hidden    Objective:    Physical Exam  Constitutional: She is oriented to person, place, and time. She appears well-developed and well-nourished. No distress.  HENT:  Head: Normocephalic and atraumatic.  Right Ear: External ear normal.  Left Ear: External ear normal.  Mouth/Throat: Oropharynx is clear and moist. No oropharyngeal exudate.  Eyes: Pupils are equal, round, and reactive to light. Conjunctivae are normal. Right eye exhibits no  discharge. Left eye exhibits no discharge. No scleral icterus.  Neck: No JVD present. No tracheal deviation present. No thyromegaly present.  Cardiovascular: Normal rate, regular rhythm and normal heart sounds.  Pulses:      Dorsalis pedis pulses are 1+ on the right side and 1+ on the left side.       Posterior tibial pulses are 1+ on the right side and 1+ on the left side.  Pulmonary/Chest: Effort normal and breath sounds normal. No stridor.  Lymphadenopathy:    She has no cervical adenopathy.  Neurological: She is alert and oriented to person, place, and time.  Skin: Skin is warm and dry. She is not diaphoretic.  Psychiatric: She has a normal mood and affect. Her behavior is normal.    BP 108/64   Pulse 93   Temp 97.8 F (36.6 C) (Tympanic)   Ht 5\' 5"  (1.651 m)   Wt 288 lb 9.6 oz (130.9 kg)   SpO2 97%   BMI 48.03 kg/m  Wt Readings from Last 3 Encounters:  09/20/19 288 lb 9.6 oz (130.9 kg)  09/07/19 285 lb (129.3 kg)  08/21/19 280 lb (127 kg)     There are no preventive care reminders to display for this patient.  There are no preventive care reminders to display for this patient.  Lab Results  Component Value Date   TSH 0.540 08/20/2019   Lab Results  Component Value Date   WBC 8.8 07/13/2019   HGB 11.3 (L) 07/13/2019   HCT 34.0 (L) 07/13/2019   MCV 94.2 07/13/2019   PLT 225 07/13/2019   Lab Results  Component Value Date   NA 138 07/13/2019   K 3.9 07/13/2019   CO2 22 07/13/2019   GLUCOSE 104 (H) 07/13/2019   BUN 13 07/13/2019   CREATININE 0.89 07/13/2019   BILITOT 0.8 03/02/2019   ALKPHOS 89 03/02/2019   AST 14 (L) 03/02/2019   ALT 15 03/02/2019   PROT 7.8 03/02/2019   ALBUMIN 4.5 03/02/2019   CALCIUM 9.0 07/13/2019   ANIONGAP 12 07/13/2019   GFR 82.90 01/22/2019   Lab Results  Component Value Date   CHOL 119 09/05/2018   Lab Results  Component Value Date   HDL 43 09/05/2018   Lab Results  Component Value Date   LDLCALC 55 09/05/2018  Lab Results  Component Value Date   TRIG 106 09/05/2018   Lab Results  Component Value Date   CHOLHDL 2.8 09/05/2018   Lab Results  Component Value Date   HGBA1C 5.8 (H) 08/20/2019      Assessment & Plan:   Problem List Items Addressed This Visit      Other   Vitamin D deficiency   Relevant Orders   VITAMIN D 25 Hydroxy (Vit-D Deficiency, Fractures)   Pre-diabetes - Primary    Other Visit Diagnoses    Elevated cholesterol       Relevant Orders   Lipid panel      Meds ordered this encounter  Medications  . DISCONTD: metFORMIN (GLUCOPHAGE-XR) 500 MG 24 hr tablet    Sig: Take 1 tablet (500 mg total) by mouth daily with breakfast.    Dispense:  90 tablet    Refill:  1    Follow-up: Return in about 3 months (around 12/21/2019).   Is planning on working with a Physiological scientist in a few days.  Would like to have a chance to work out lose weight prior to starting the medicine for diabetes.  She will do her best.  Pending vitamin D levels we may switch her to 5000 international units of vitamin D daily.  She will return fasting for above ordered blood work.  She was given information on preventing diabetes. Libby Maw, MD

## 2019-09-25 ENCOUNTER — Other Ambulatory Visit: Payer: Self-pay

## 2019-09-26 ENCOUNTER — Other Ambulatory Visit (INDEPENDENT_AMBULATORY_CARE_PROVIDER_SITE_OTHER): Payer: BC Managed Care – PPO

## 2019-09-26 DIAGNOSIS — E7800 Pure hypercholesterolemia, unspecified: Secondary | ICD-10-CM | POA: Diagnosis not present

## 2019-09-26 DIAGNOSIS — E559 Vitamin D deficiency, unspecified: Secondary | ICD-10-CM

## 2019-09-26 LAB — LIPID PANEL
Cholesterol: 119 mg/dL (ref 0–200)
HDL: 45.1 mg/dL (ref >39.00)
LDL Cholesterol: 59 mg/dL (ref 0–99)
NonHDL: 73.81
Total CHOL/HDL Ratio: 3
Triglycerides: 76 mg/dL (ref 0.0–149.0)
VLDL: 15.2 mg/dL (ref 0.0–40.0)

## 2019-09-26 LAB — VITAMIN D 25 HYDROXY (VIT D DEFICIENCY, FRACTURES): VITD: 33.66 ng/mL (ref 30.00–100.00)

## 2019-09-27 ENCOUNTER — Encounter: Payer: BC Managed Care – PPO | Admitting: Family Medicine

## 2019-09-27 ENCOUNTER — Encounter: Payer: Self-pay | Admitting: Family Medicine

## 2019-09-27 NOTE — Telephone Encounter (Signed)
Please see other Mychart message.

## 2019-10-02 ENCOUNTER — Encounter: Payer: Self-pay | Admitting: Family Medicine

## 2019-10-02 NOTE — Telephone Encounter (Signed)
Need to stay on the cholesterol meds because of the blockages in the arteries.  Vit D is back to low normal. Need to continue high dose for now but should be able to switch you to otc doses when I see you next.

## 2019-10-04 NOTE — Progress Notes (Signed)
Cardiology Clinic Note   Patient Name: Christina [name] Date of Encounter: 10/05/2019  Primary Care Provider:  Libby Maw, MD Primary Cardiologist:  Mertie Moores, MD  Patient Profile    Christina [name] 54 year old female presents today for follow-up evaluation of her chest pain.  Past Medical History    Past Medical History:  Diagnosis Date  . Acute blood loss as cause of postoperative anemia   . Anemia 01/22/2019  . Chest pain    a. prior coronary CT without CAD.  Marland Kitchen Elevated sed rate 01/22/2019  . Esophageal dysmotility   . Essential hypertension 07/08/2016  . GERD (gastroesophageal reflux disease)   . MGUS (monoclonal gammopathy of unknown significance)   . Numbness and tingling   . PAD (peripheral artery disease) ([employer])    a. 05/2018: PV angio subacute thrombotic occlusion of her popliteal artery. She underwent successful penumbra aspiration thrombectomy, PTA and self-expanding stenting using overlapping Tigris self-expanding stents of a long thrombotic occlusion of the popliteal, anterior tibial and tibioperoneal trunk. Procedure c/b pseudoaneurysm/ABL anemia.  . 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[place])   . Uterine fibroid   . Vitamin D deficiency    Past Surgical History:  Procedure Laterality Date  . ABDOMINAL AORTOGRAM W/LOWER EXTREMITY Right 05/08/2018   Procedure: ABDOMINAL AORTOGRAM W/LOWER EXTREMITY;  Surgeon: Lorretta Harp, MD;  Location: [place] [facility];  Service: Cardiovascular;  Laterality: Right;  . LOWER EXTREMITY ANGIOGRAPHY  07/12/2019   Procedure: Lower Extremity Angiography;  Surgeon: Lorretta Harp, MD;  Location: [place] [facility];  Service: Cardiovascular;;  . lump removed from breast at age 54    . PERIPHERAL VASCULAR BALLOON ANGIOPLASTY  07/12/2019   Procedure: PERIPHERAL VASCULAR BALLOON ANGIOPLASTY;  Surgeon: Lorretta Harp, MD;  Location: [place] [facility];  Service: Cardiovascular;;  . PERIPHERAL VASCULAR INTERVENTION Right  05/08/2018   Procedure: PERIPHERAL VASCULAR INTERVENTION;  Surgeon: Lorretta Harp, MD;  Location: [place] [facility];  Service: Cardiovascular;  Laterality: Right;  Anterior tibial and popliteal stents  . PERIPHERAL VASCULAR THROMBECTOMY Right 05/08/2018   Procedure: PERIPHERAL VASCULAR THROMBECTOMY;  Surgeon: Lorretta Harp, MD;  Location: [place] [facility];  Service: Cardiovascular;  Laterality: Right;  Popliteal, tibioperoneal trunk, Anterior tibial, Posterior tibial    Allergies  Allergies  Allergen Reactions  . Lisinopril Cough    History of Present Illness    [name] has a PMH of essential hypertension, PAD, critical limb ischemia with history of revascularization of same extremity, pseudoaneurysm, osteoarthritis of shoulder, uterine fibroid, vitamin D deficiency, atypical chest pain, prediabetes, obesity, anemia, hyperlipidemia, and claudication and peripheral vascular disease.  She was last seen by Dr. Gwenlyn Found on 09/07/2019.  During that time she complained of areas of bruising on her body with small subcentimeter nodule on her left anterior thigh.  The balance between bleeding and thrombosis was discussed.  She has right popliteal artery and tibioperoneal trunk stenting and has hyper coagulable state.  On Eliquis.  She complained of chronic chest pain going into her neck and jaw.  She underwent Myoview 12/19 which was negative.  She had coronary CTA 11/18 which showed a coronary calcium score of 0 and no evidence of coronary artery disease.  It was felt that she may have coronary vasospasm or microvascular angina versus noncardiac cause of chest pain.  She was started on amlodipine 2.5 mg.  She presents the clinic today for follow-up evaluation and states she has daily episodes of chest pressure that will radiate up  the middle of her chest to her jaw.  This discomfort comes on at rest.  On occasion it wakes her up from sleep at night.  She never has exertional chest discomfort.  Her LDL  from 09/26/2019 is 59.  She states her last episode of chest pressure and radiation was this morning.  When asked what she had for breakfast she states she had boiled shrimp.  I will give her the instructions for GERD diet and lifestyle.  I will have her start Protonix.  She states she had no relief from amlodipine medication therefore, I will stop amlodipine.  I will have her follow-up in 1 month.  Today she denies  shortness of breath, lower extremity edema, fatigue, palpitations, melena, hematuria, hemoptysis, diaphoresis, weakness, presyncope, syncope, orthopnea, and PND.   Home Medications    Prior to Admission medications   Medication Sig Start Date End Date Taking? Authorizing Provider  amLODipine (NORVASC) 2.5 MG tablet Take 1 tablet (2.5 mg total) by mouth daily. 09/07/19 12/06/19  Lorretta Harp, MD  atorvastatin (LIPITOR) 80 MG tablet Take 1 tablet (80 mg total) by mouth daily. Patient taking differently: Take 80 mg by mouth daily in the afternoon.  01/19/19   Libby Maw, MD  carvedilol (COREG) 6.25 MG tablet Take 1 tablet (6.25 mg total) by mouth 2 (two) times daily. 03/13/19   Nahser, Wonda Cheng, MD  Cholecalciferol (VITAMIN D) 50 MCG (2000 UT) tablet Take 2,000 Units by mouth daily in the afternoon.    [provider]  clopidogrel (PLAVIX) 75 MG tablet Take 1 tablet (75 mg total) by mouth daily. 06/04/19   Lorretta Harp, MD  ELIQUIS 5 MG TABS tablet Take 1 tablet by mouth twice daily Patient taking differently: Take 5 mg by mouth 2 (two) times daily.  05/23/19   Nahser, Wonda Cheng, MD  hydrochlorothiazide (MICROZIDE) 12.5 MG capsule Take 1 capsule (12.5 mg total) by mouth daily. 01/19/19 01/14/20  Nahser, Wonda Cheng, MD  megestrol (MEGACE) 40 MG tablet Take 1 tablet (40 mg total) by mouth 2 (two) times daily. Patient taking differently: Take 40 mg by mouth daily in the afternoon.  02/14/19   Lavonia Drafts, MD  potassium chloride (KLOR-CON) 10 MEQ tablet Take 1  tablet (10 mEq total) by mouth 3 (three) times a week. Patient taking differently: Take 10 mEq by mouth every Monday, Wednesday, and Friday. In the afternoon 04/27/19   Libby Maw, MD  valsartan (DIOVAN) 160 MG tablet Take 1 tablet by mouth once daily 09/03/19   Lorretta Harp, MD  vitamin B-12 (CYANOCOBALAMIN) 1000 MCG tablet Take 1,000 mcg by mouth daily in the afternoon.     [provider]  Vitamin D, Ergocalciferol, (DRISDOL) 1.25 MG (50000 UNIT) CAPS capsule Take 1 capsule by mouth once a week 09/20/19   Libby Maw, MD    Family History    Family History  Problem Relation Age of Onset  . Hypertension Mother   . Alzheimer's disease Father   . Healthy Son   . Colon polyps Neg Hx   . Crohn's disease Neg Hx   . Rectal cancer Neg Hx   . Stomach cancer Neg Hx   . Pancreatic cancer Neg Hx    She indicated that her mother is alive. She indicated that her father is deceased. She indicated that her maternal grandmother is deceased. She indicated that her maternal grandfather is deceased. She indicated that her paternal grandmother is deceased. She indicated that  her paternal grandfather is deceased. She indicated that her son is alive. She indicated that the status of her neg hx is unknown.  Social History    Social History   Socioeconomic History  . Marital status: Single    Spouse name: Not on file  . Number of children: 1  . Years of education: two years of college  . Highest education level: Not on file  Occupational History  . Not on file  Tobacco Use  . Smoking status: Never Smoker  . Smokeless tobacco: Never Used  Substance and Sexual Activity  . Alcohol use: No  . Drug use: No  . Sexual activity: Yes  Other Topics Concern  . Not on file  Social History Narrative   Lives alone.   Right-handed.   No daily use of caffeine.   Social Determinants of Health   Financial Resource Strain:   . Difficulty of Paying Living Expenses:   Food  Insecurity:   . Worried About Charity fundraiser in the Last Year:   . Arboriculturist in the Last Year:   Transportation Needs:   . Film/video editor (Medical):   Marland Kitchen Lack of Transportation (Non-Medical):   Physical Activity:   . Days of Exercise per Week:   . Minutes of Exercise per Session:   Stress:   . Feeling of Stress :   Social Connections:   . Frequency of Communication with Friends and Family:   . Frequency of Social Gatherings with Friends and Family:   . Attends Religious Services:   . Active Member of Clubs or Organizations:   . Attends Archivist Meetings:   Marland Kitchen Marital Status:   Intimate Partner Violence:   . Fear of Current or Ex-Partner:   . Emotionally Abused:   Marland Kitchen Physically Abused:   . Sexually Abused:      Review of Systems    General:  No chills, fever, night sweats or weight changes.  Cardiovascular:  No chest pain, dyspnea on exertion, edema, orthopnea, palpitations, paroxysmal nocturnal dyspnea. Dermatological: No rash, lesions/masses Respiratory: No cough, dyspnea Urologic: No hematuria, dysuria Abdominal:   No nausea, vomiting, diarrhea, bright red blood per rectum, melena, or hematemesis Neurologic:  No visual changes, wkns, changes in mental status. All other systems reviewed and are otherwise negative except as noted above.  Physical Exam    VS:  BP 138/82   Pulse 89   Ht 5\' 5"  (1.651 m)   Wt 288 lb 6.4 oz (130.8 kg)   SpO2 97%   BMI 47.99 kg/m  , BMI Body mass index is 47.99 kg/m. GEN: Well nourished, well developed, in no acute distress. HEENT: normal. Neck: Supple, no JVD, carotid bruits, or masses. Cardiac: RRR, no murmurs, rubs, or gallops. No clubbing, cyanosis, edema.  Radials/DP/PT 2+ and equal bilaterally.  Respiratory:  Respirations regular and unlabored, clear to auscultation bilaterally. GI: Soft, nontender, nondistended, BS + x 4. MS: no deformity or atrophy. Skin: warm and dry, no rash. Neuro:  Strength and  sensation are intact. Psych: Normal affect.  Accessory Clinical Findings    ECG personally reviewed by me today-sinus rhythm with occasional preventricular complexes moderate voltage criteria for LVH 89 bpm.  EKG 04/10/2019 Normal sinus rhythm 83 bpm moderate voltage criteria for LVH  Echocardiogram 11/21/2018 IMPRESSIONS    1. The left ventricle has normal systolic function with an ejection  fraction of 60-65%. The cavity size was normal. Left ventricular diastolic  Doppler parameters are  consistent with impaired relaxation.  2. The right ventricle has normal systolic function. The cavity was  normal. There is no increase in right ventricular wall thickness.  3. Mild thickening of the mitral valve leaflet.  4. The aortic root is normal in size and structure.  5. The average left ventricular global longitudinal strain is -17.0 %.   Coronary CTA 03/04/2017 FINDINGS: Cardiovascular: Top-normal heart size. No significant pericardial fluid/thickening. Normal course and caliber of the visualized thoracic aorta. Dilated main pulmonary artery (3.6 cm diameter). No central pulmonary emboli.  Mediastinum/Nodes: Unremarkable esophagus. No pathologically enlarged axillary, mediastinal or hilar lymph nodes.  Lungs/Pleura: No pneumothorax. No pleural effusion. No acute consolidative airspace disease, lung masses or significant pulmonary nodules.  Upper abdomen: Unremarkable.  Musculoskeletal: No aggressive appearing focal osseous lesions. Mild thoracic spondylosis.  IMPRESSION: 1. Dilated main pulmonary artery, suggesting pulmonary arterial hypertension. 2. Otherwise no significant extracardiac findings in the visualized chest.  IMPRESSION: 1. Coronary calcium score of 0. This was 0 percentile for age and sex matched control.  2. Normal coronary origin with right dominance.  3. No evidence of CAD.  4. Pulmonary artery is significantly dilated measuring 40 mm  suggestive of pulmonary hypertension.  Assessment & Plan   1.  Chest pain/GERD -had an episode of chest pressure this morning.  This has been chronic in nature.Myoview 12/19 which was negative.  She had coronary CTA 11/18 which showed a coronary calcium score of 0 and no evidence of coronary artery disease.  It was felt that she may have coronary vasospasm or microvascular angina versus noncardiac cause of chest pain.  Did not receive any relief with addition of amlodipine.  Appears to be acid reflux in nature. Stop amlodipine 2.5 mg Start Protonix 20 mg daily GERD diet/lifestyle information given-instructed to elevate the head of her bed, avoid citrus type foods, avoid spicy foods, wear loose clothing, lose weight, and increase physical activity. Continue carvedilol, atorvastatin, clopidogrel, valsartan   Essential hypertension-BP today 138/82.  Well-controlled at home. Continue carvedilol, amlodipine, valsartan Heart healthy low-sodium diet-salty 6 given Increase physical activity as tolerated  Hyperlipidemia-LDL 59 on 09/26/2019 Continue atorvastatin Heart healthy low-sodium high-fiber diet Increase physical activity as tolerated  Disposition: Follow-up with me in 1 month.  Jossie Ng. Cleaver NP-C    10/05/2019, 3:46 PM [address] Office [phone] Fax [phone]

## 2019-10-05 ENCOUNTER — Ambulatory Visit (INDEPENDENT_AMBULATORY_CARE_PROVIDER_SITE_OTHER): Payer: BC Managed Care – PPO | Admitting: General Practice

## 2019-10-05 ENCOUNTER — Encounter: Payer: Self-pay | Admitting: General Practice

## 2019-10-05 ENCOUNTER — Other Ambulatory Visit: Payer: Self-pay

## 2019-10-05 VITALS — BP 138/82 | HR 89 | Ht 65.0 in | Wt 288.4 lb

## 2019-10-05 DIAGNOSIS — R079 Chest pain, unspecified: Secondary | ICD-10-CM

## 2019-10-05 DIAGNOSIS — E782 Mixed hyperlipidemia: Secondary | ICD-10-CM | POA: Diagnosis not present

## 2019-10-05 DIAGNOSIS — K219 Gastro-esophageal reflux disease without esophagitis: Secondary | ICD-10-CM

## 2019-10-05 DIAGNOSIS — I10 Essential (primary) hypertension: Secondary | ICD-10-CM | POA: Diagnosis not present

## 2019-10-05 MED ORDER — PANTOPRAZOLE SODIUM 20 MG PO TBEC: 20.0000 mg | DELAYED_RELEASE_TABLET | Freq: Every day | ORAL | 3 refills | Status: DC

## 2019-10-05 NOTE — Patient Instructions (Addendum)
Medication Instructions:  START pantoprazole (Protonix) 20 mg daily STOP amlodipine  *If you need a refill on your cardiac medications before your next appointment, please call your pharmacy*  Follow-Up: At [facility], you and your health needs are our priority.  As part of our continuing mission to provide you with exceptional heart care, we have created designated Provider Care Teams.  These Care Teams include your primary Cardiologist (physician) and Advanced Practice Providers (APPs -  Physician Assistants and Nurse Practitioners) who all work together to provide you with the care you need, when you need it.  We recommend signing up for the patient portal called "MyChart".  Sign up information is provided on this After Visit Summary.  MyChart is used to connect with patients for Virtual Visits (Telemedicine).  Patients are able to view lab/test results, encounter notes, upcoming appointments, etc.  Non-urgent messages can be sent to your provider as well.   To learn more about what you can do with MyChart, go to [email or web address].    Your next appointment:   1 month(s)  The format for your next appointment:   In Person  Provider:    Coletta Memos, FNP    Other Instructions  Food Choices for Gastroesophageal Reflux Disease, Adult When you have gastroesophageal reflux disease (GERD), the foods you eat and your eating habits are very important. Choosing the right foods can help ease your discomfort. Think about working with a nutrition specialist (dietitian) to help you make good choices. What are tips for following this plan?  Meals  Choose healthy foods that are low in fat, such as fruits, vegetables, whole grains, low-fat dairy products, and lean meat, fish, and poultry.  Eat small meals often instead of 3 large meals a day. Eat your meals slowly, and in a place where you are relaxed. Avoid bending over or lying down until 2-3 hours after eating.  Avoid eating meals  2-3 hours before bed.  Avoid drinking a lot of liquid with meals.  Cook foods using methods other than frying. Bake, grill, or broil food instead.  Avoid or limit: ? Chocolate. ? Peppermint or spearmint. ? Alcohol. ? Pepper. ? Black and decaffeinated coffee. ? Black and decaffeinated tea. ? Bubbly (carbonated) soft drinks. ? Caffeinated energy drinks and soft drinks.  Limit high-fat foods such as: ? Fatty meat or fried foods. ? Whole milk, cream, butter, or ice cream. ? Nuts and nut butters. ? Pastries, donuts, and sweets made with butter or shortening.  Avoid foods that cause symptoms. These foods may be different for everyone. Common foods that cause symptoms include: ? Tomatoes. ? Oranges, lemons, and limes. ? Peppers. ? Spicy food. ? Onions and garlic. ? Vinegar. Lifestyle  Maintain a healthy weight. Ask your doctor what weight is healthy for you. If you need to lose weight, work with your doctor to do so safely.  Exercise for at least 30 minutes for 5 or more days each week, or as told by your doctor.  Wear loose-fitting clothes.  Do not smoke. If you need help quitting, ask your doctor.  Sleep with the head of your bed higher than your feet. Use a wedge under the mattress or blocks under the bed frame to raise the head of the bed. Summary  When you have gastroesophageal reflux disease (GERD), food and lifestyle choices are very important in easing your symptoms.  Eat small meals often instead of 3 large meals a day. Eat your meals slowly, and in a place  where you are relaxed.  Limit high-fat foods such as fatty meat or fried foods.  Avoid bending over or lying down until 2-3 hours after eating.  Avoid peppermint and spearmint, caffeine, alcohol, and chocolate. This information is not intended to replace advice given to you by your health care provider. Make sure you discuss any questions you have with your health care provider. Document Revised: 08/10/2018  Document Reviewed: 05/25/2016 Elsevier Patient Education  [place].

## 2019-10-11 NOTE — Telephone Encounter (Signed)
I do not believe that you have any of those diseases. With age, some folks just don't absorb the vitamin as well. Will continue to follow it for you.

## 2019-10-18 ENCOUNTER — Other Ambulatory Visit: Payer: Self-pay | Admitting: Family Medicine

## 2019-10-18 DIAGNOSIS — Z862 Personal history of diseases of the blood and blood-forming organs and certain disorders involving the immune mechanism: Secondary | ICD-10-CM

## 2019-10-18 DIAGNOSIS — R7303 Prediabetes: Secondary | ICD-10-CM

## 2019-10-18 DIAGNOSIS — R42 Dizziness and giddiness: Secondary | ICD-10-CM

## 2019-10-18 DIAGNOSIS — E559 Vitamin D deficiency, unspecified: Secondary | ICD-10-CM

## 2019-10-19 ENCOUNTER — Ambulatory Visit (HOSPITAL_BASED_OUTPATIENT_CLINIC_OR_DEPARTMENT_OTHER): Payer: BC Managed Care – PPO

## 2019-10-19 DIAGNOSIS — M7672 Peroneal tendinitis, left leg: Secondary | ICD-10-CM | POA: Diagnosis not present

## 2019-10-19 DIAGNOSIS — L03032 Cellulitis of left toe: Secondary | ICD-10-CM | POA: Diagnosis not present

## 2019-10-19 DIAGNOSIS — M71572 Other bursitis, not elsewhere classified, left ankle and foot: Secondary | ICD-10-CM | POA: Diagnosis not present

## 2019-10-22 ENCOUNTER — Encounter (HOSPITAL_BASED_OUTPATIENT_CLINIC_OR_DEPARTMENT_OTHER): Payer: Self-pay

## 2019-10-22 ENCOUNTER — Ambulatory Visit (HOSPITAL_BASED_OUTPATIENT_CLINIC_OR_DEPARTMENT_OTHER)
Admission: RE | Admit: 2019-10-22 | Discharge: 2019-10-22 | Disposition: A | Discharge status: Home / Self Care | Payer: BC Managed Care – PPO | Source: Ambulatory Visit | Attending: Family Medicine | Admitting: Family Medicine

## 2019-10-22 ENCOUNTER — Other Ambulatory Visit: Payer: Self-pay

## 2019-10-22 DIAGNOSIS — Z1231 Encounter for screening mammogram for malignant neoplasm of breast: Secondary | ICD-10-CM

## 2019-11-05 NOTE — Progress Notes (Signed)
Cardiology Clinic Note   Patient Name: Christina [name] Date of Encounter: 11/06/2019  Primary Care Provider:  Libby Maw, MD Primary Cardiologist:  Mertie Moores, MD  Patient Profile    Christina [name] 54 year old female presents today for follow-up evaluation of her chest pain.  Past Medical History    Past Medical History:  Diagnosis Date  . Acute blood loss as cause of postoperative anemia   . Anemia 01/22/2019  . Chest pain    a. prior coronary CT without CAD.  Marland Kitchen Elevated sed rate 01/22/2019  . Esophageal dysmotility   . Essential hypertension 07/08/2016  . GERD (gastroesophageal reflux disease)   . MGUS (monoclonal gammopathy of unknown significance)   . Numbness and tingling   . PAD (peripheral artery disease) ([place])    a. 05/2018: PV angio subacute thrombotic occlusion of her popliteal artery. She underwent successful penumbra aspiration thrombectomy, PTA and self-expanding stenting using overlapping [place] self-expanding stents of a long thrombotic occlusion of the popliteal, anterior tibial and tibioperoneal trunk. Procedure c/b pseudoaneurysm/ABL anemia.  . 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[place])   . Uterine fibroid   . Vitamin D deficiency    Past Surgical History:  Procedure Laterality Date  . ABDOMINAL AORTOGRAM W/LOWER EXTREMITY Right 05/08/2018   Procedure: ABDOMINAL AORTOGRAM W/LOWER EXTREMITY;  Surgeon: Lorretta Harp, MD;  Location: [place] [facility];  Service: Cardiovascular;  Laterality: Right;  . BREAST BIOPSY    . BREAST LUMPECTOMY    . LOWER EXTREMITY ANGIOGRAPHY  07/12/2019   Procedure: Lower Extremity Angiography;  Surgeon: Lorretta Harp, MD;  Location: [place] [facility];  Service: Cardiovascular;;  . lump removed from breast at age 54    . PERIPHERAL VASCULAR BALLOON ANGIOPLASTY  07/12/2019   Procedure: PERIPHERAL VASCULAR BALLOON ANGIOPLASTY;  Surgeon: Lorretta Harp, MD;  Location: [place] [facility];  Service: Cardiovascular;;  .  PERIPHERAL VASCULAR INTERVENTION Right 05/08/2018   Procedure: PERIPHERAL VASCULAR INTERVENTION;  Surgeon: Lorretta Harp, MD;  Location: [place] [facility];  Service: Cardiovascular;  Laterality: Right;  Anterior tibial and popliteal stents  . PERIPHERAL VASCULAR THROMBECTOMY Right 05/08/2018   Procedure: PERIPHERAL VASCULAR THROMBECTOMY;  Surgeon: Lorretta Harp, MD;  Location: [place] [facility];  Service: Cardiovascular;  Laterality: Right;  Popliteal, tibioperoneal trunk, Anterior tibial, Posterior tibial    Allergies  Allergies  Allergen Reactions  . Lisinopril Cough    History of Present Illness    Ms. Speth has a PMH of essential hypertension, PAD, critical limb ischemia with history of revascularization of same extremity, pseudoaneurysm, osteoarthritis of shoulder, uterine fibroid, vitamin D deficiency, atypical chest pain, prediabetes, obesity, anemia, hyperlipidemia, and claudication and peripheral vascular disease.  She was last seen by Dr. Gwenlyn Found on 09/07/2019.  During that time she complained of areas of bruising on her body with small subcentimeter nodule on her left anterior thigh.  The balance between bleeding and thrombosis was discussed.  She has right popliteal artery and tibioperoneal trunk stenting and has hyper coagulable state.  On Eliquis.  She complained of chronic chest pain going into her neck and jaw.  She underwent Myoview 12/19 which was negative.  She had coronary CTA 11/18 which showed a coronary calcium score of 0 and no evidence of coronary artery disease.  It was felt that she may have coronary vasospasm or microvascular angina versus noncardiac cause of chest pain.  She was started on amlodipine 2.5 mg.  She presented to the clinic 10/05/2019 for follow-up evaluation  and stated she had daily episodes of chest pressure that will radiate up the middle of her chest to her jaw.  This discomfort came on at rest.  On occasion it  Would wake her up from sleep at night.   She never had exertional chest discomfort.  Her LDL from 09/26/2019 is 59.  She stated her last episode of chest pressure and radiation was that morning.  When asked what she had for breakfast she stated she had boiled shrimp.  I  gave her the instructions for GERD diet and lifestyle.  I will have her start Protonix.  She stated she had no relief from amlodipine medication therefore, I  stopped amlodipine.  I scheduled follow-up for 1 month.  She presents to the clinic today for follow-up evaluation and states that she did not take any of the Protonix medication that was prescribed.  She stated that she eat more spicy foods and did not follow the GERD diet instructions that were given to her.  She has not had any further attacks in the last month.  She did indicate that she has had increased stress in her life since 2016.  When asked about the stress she stated that it has been ongoing with a family member.  We discussed the benefits of lowering stress and how it might be contributing to her chest pressure/painful episodes.  I have encouraged her to talk with a stress counselor through her place of employment, given her the mindfulness stress reduction sheet, and have also encouraged her to increase her physical activity.  I will have her follow-up with Dr. Acie Fredrickson in 6 months.  Today she denies   chest pain, shortness of breath, lower extremity edema, fatigue, palpitations, melena, hematuria, hemoptysis, diaphoresis, weakness, presyncope, syncope, orthopnea, and PND.  Home Medications    Prior to Admission medications   Medication Sig Start Date End Date Taking? Authorizing Provider  atorvastatin (LIPITOR) 80 MG tablet Take 1 tablet (80 mg total) by mouth daily. Patient taking differently: Take 80 mg by mouth daily in the afternoon.  01/19/19   Libby Maw, MD  carvedilol (COREG) 6.25 MG tablet Take 1 tablet (6.25 mg total) by mouth 2 (two) times daily. 03/13/19   Nahser, Wonda Cheng, MD   Cholecalciferol (VITAMIN D) 50 MCG (2000 UT) tablet Take 2,000 Units by mouth daily in the afternoon.    [provider]  clopidogrel (PLAVIX) 75 MG tablet Take 1 tablet (75 mg total) by mouth daily. 06/04/19   Lorretta Harp, MD  ELIQUIS 5 MG TABS tablet Take 1 tablet by mouth twice daily Patient taking differently: Take 5 mg by mouth 2 (two) times daily.  05/23/19   Nahser, Wonda Cheng, MD  hydrochlorothiazide (MICROZIDE) 12.5 MG capsule Take 1 capsule (12.5 mg total) by mouth daily. 01/19/19 01/14/20  Nahser, Wonda Cheng, MD  megestrol (MEGACE) 40 MG tablet Take 1 tablet (40 mg total) by mouth 2 (two) times daily. Patient taking differently: Take 40 mg by mouth daily in the afternoon.  02/14/19   Lavonia Drafts, MD  pantoprazole (PROTONIX) 20 MG tablet Take 1 tablet (20 mg total) by mouth daily. 10/05/19   Deberah Pelton, NP  potassium chloride (KLOR-CON) 10 MEQ tablet Take 1 tablet (10 mEq total) by mouth 3 (three) times a week. Patient taking differently: Take 10 mEq by mouth every Monday, Wednesday, and Friday. In the afternoon 04/27/19   Libby Maw, MD  valsartan (DIOVAN) 160 MG tablet Take 1 tablet  by mouth once daily 09/03/19   Lorretta Harp, MD  vitamin B-12 (CYANOCOBALAMIN) 1000 MCG tablet Take 1,000 mcg by mouth daily in the afternoon.     [provider]  Vitamin D, Ergocalciferol, (DRISDOL) 1.25 MG (50000 UNIT) CAPS capsule Take 1 capsule by mouth once a week 09/20/19   Libby Maw, MD    Family History    Family History  Problem Relation Age of Onset  . Hypertension Mother   . Alzheimer's disease Father   . Healthy Son   . Colon polyps Neg Hx   . Crohn's disease Neg Hx   . Rectal cancer Neg Hx   . Stomach cancer Neg Hx   . Pancreatic cancer Neg Hx    She indicated that her mother is alive. She indicated that her father is deceased. She indicated that her maternal grandmother is deceased. She indicated that her maternal grandfather  is deceased. She indicated that her paternal grandmother is deceased. She indicated that her paternal grandfather is deceased. She indicated that her son is alive. She indicated that the status of her neg hx is unknown.  Social History    Social History   Socioeconomic History  . Marital status: Single    Spouse name: Not on file  . Number of children: 1  . Years of education: two years of college  . Highest education level: Not on file  Occupational History  . Not on file  Tobacco Use  . Smoking status: Never Smoker  . Smokeless tobacco: Never Used  Vaping Use  . Vaping Use: Never used  Substance and Sexual Activity  . Alcohol use: No  . Drug use: No  . Sexual activity: Yes  Other Topics Concern  . Not on file  Social History Narrative   Lives alone.   Right-handed.   No daily use of caffeine.   Social Determinants of Health   Financial Resource Strain:   . Difficulty of Paying Living Expenses:   Food Insecurity:   . Worried About Charity fundraiser in the Last Year:   . Arboriculturist in the Last Year:   Transportation Needs:   . Film/video editor (Medical):   Marland Kitchen Lack of Transportation (Non-Medical):   Physical Activity:   . Days of Exercise per Week:   . Minutes of Exercise per Session:   Stress:   . Feeling of Stress :   Social Connections:   . Frequency of Communication with Friends and Family:   . Frequency of Social Gatherings with Friends and Family:   . Attends Religious Services:   . Active Member of Clubs or Organizations:   . Attends Archivist Meetings:   Marland Kitchen Marital Status:   Intimate Partner Violence:   . Fear of Current or Ex-Partner:   . Emotionally Abused:   Marland Kitchen Physically Abused:   . Sexually Abused:      Review of Systems    General:  No chills, fever, night sweats or weight changes.  Cardiovascular:  No chest pain, dyspnea on exertion, edema, orthopnea, palpitations, paroxysmal nocturnal dyspnea. Dermatological: No rash,  lesions/masses Respiratory: No cough, dyspnea Urologic: No hematuria, dysuria Abdominal:   No nausea, vomiting, diarrhea, bright red blood per rectum, melena, or hematemesis Neurologic:  No visual changes, wkns, changes in mental status. All other systems reviewed and are otherwise negative except as noted above.  Physical Exam    VS:  BP 112/64 (BP Location: Left Arm, Patient Position: Sitting,  Cuff Size: Large)   Pulse 92   Ht 5\' 5"  (1.651 m)   Wt 293 lb (132.9 kg)   SpO2 98%   BMI 48.76 kg/m  , BMI Body mass index is 48.76 kg/m. GEN: Well nourished, well developed, in no acute distress. HEENT: normal. Neck: Supple, no JVD, carotid bruits, or masses. Cardiac: RRR, no murmurs, rubs, or gallops. No clubbing, cyanosis, edema.  Radials/DP/PT 2+ and equal bilaterally.  Respiratory:  Respirations regular and unlabored, clear to auscultation bilaterally. GI: Soft, nontender, nondistended, BS + x 4. MS: no deformity or atrophy. Skin: warm and dry, no rash. Neuro:  Strength and sensation are intact. Psych: Normal affect.  Accessory Clinical Findings    ECG personally reviewed by me today-none today.  EKG 10/05/2019 sinus rhythm with occasional preventricular complexes moderate voltage criteria for LVH 89 bpm.  EKG 04/10/2019 Normal sinus rhythm 83 bpm moderate voltage criteria for LVH  Echocardiogram 11/21/2018 IMPRESSIONS    1. The left ventricle has normal systolic function with an ejection  fraction of 60-65%. The cavity size was normal. Left ventricular diastolic  Doppler parameters are consistent with impaired relaxation.  2. The right ventricle has normal systolic function. The cavity was  normal. There is no increase in right ventricular wall thickness.  3. Mild thickening of the mitral valve leaflet.  4. The aortic root is normal in size and structure.  5. The average left ventricular global longitudinal strain is -17.0 %.   Coronary CTA 03/04/2017 FINDINGS:  Cardiovascular: Top-normal heart size. No significant pericardial fluid/thickening. Normal course and caliber of the visualized thoracic aorta. Dilated main pulmonary artery (3.6 cm diameter). No central pulmonary emboli.  Mediastinum/Nodes: Unremarkable esophagus. No pathologically enlarged axillary, mediastinal or hilar lymph nodes.  Lungs/Pleura: No pneumothorax. No pleural effusion. No acute consolidative airspace disease, lung masses or significant pulmonary nodules.  Upper abdomen: Unremarkable.  Musculoskeletal: No aggressive appearing focal osseous lesions. Mild thoracic spondylosis.  IMPRESSION: 1. Dilated main pulmonary artery, suggesting pulmonary arterial hypertension. 2. Otherwise no significant extracardiac findings in the visualized chest.  IMPRESSION: 1. Coronary calcium score of 0. This was 0 percentile for age and sex matched control.  2. Normal coronary origin with right dominance.  3. No evidence of CAD.  4. Pulmonary artery is significantly dilated measuring 40 mm suggestive of pulmonary hypertension.  Assessment & Plan   1.  Chest pain -no further episodes of chest pressure.  Last episode of chest pressure morning of 10/05/2019.  Chronic in nature. Myoview 12/19 which was negative.  She had coronary CTA 11/18 which showed a coronary calcium score of 0 and no evidence of coronary artery disease.  It was felt that she may have coronary vasospasm or microvascular angina versus noncardiac cause of chest pain.  Did not receive any relief with addition of amlodipine.  Appeared to be acid reflux in nature however, she did not take any of the Protonix.  She did not change her diet and stated that she increased spicy foods.  This appears to be related to family stress that she has been dealing with since 2016. Contact stress counselor Mindfulness stress reduction sheet given Follow-up with PCP Continue carvedilol, atorvastatin, clopidogrel, valsartan  Increase physical activity as tolerated Heart healthy low-sodium diet   Essential hypertension-BP today 112/64.  Well-controlled at home. Continue carvedilol, amlodipine, valsartan Heart healthy low-sodium diet-salty 6 given Increase physical activity as tolerated  Hyperlipidemia-LDL 59 on 09/26/2019 Continue atorvastatin Heart healthy low-sodium high-fiber diet Increase physical activity as tolerated  Disposition: Follow-up with Dr. Acie Fredrickson in 6 months.  Jossie Ng. Cleaver NP-C    11/06/2019, 4:34 PM [facility] [address] Office [phone] Fax [phone]

## 2019-11-06 ENCOUNTER — Other Ambulatory Visit: Payer: Self-pay

## 2019-11-06 ENCOUNTER — Encounter: Payer: Self-pay | Admitting: General Practice

## 2019-11-06 ENCOUNTER — Ambulatory Visit (INDEPENDENT_AMBULATORY_CARE_PROVIDER_SITE_OTHER): Payer: BC Managed Care – PPO | Admitting: General Practice

## 2019-11-06 VITALS — BP 112/64 | HR 92 | Ht 65.0 in | Wt 293.0 lb

## 2019-11-06 DIAGNOSIS — I10 Essential (primary) hypertension: Secondary | ICD-10-CM | POA: Diagnosis not present

## 2019-11-06 DIAGNOSIS — R079 Chest pain, unspecified: Secondary | ICD-10-CM

## 2019-11-06 DIAGNOSIS — E782 Mixed hyperlipidemia: Secondary | ICD-10-CM

## 2019-11-06 NOTE — Patient Instructions (Signed)
Medication Instructions:  The current medical regimen is effective;  continue present plan and medications as directed. Please refer to the Current Medication list given to you today. *If you need a refill on your cardiac medications before your next appointment, please call your pharmacy*  Special Instructions PLEASE READ AND FOLLOW MINDFULNESS TIPS ATTACHED  PLEASE READ AND FOLLOW SALTY 6-ATTACHED  PLEASE INCREASE PHYSICAL ACTIVITY AS TOLERATED  Follow-Up: Your next appointment:  6 month(s) Please call our office 2 months in advance to schedule this appointment In Person with Mertie Moores, MD  At [facility], you and your health needs are our priority.  As part of our continuing mission to provide you with exceptional heart care, we have created designated Provider Care Teams.  These Care Teams include your primary Cardiologist (physician) and Advanced Practice Providers (APPs -  Physician Assistants and Nurse Practitioners) who all work together to provide you with the care you need, when you need it.       Mindfulness-Based Stress Reduction Mindfulness-based stress reduction (MBSR) is a program that helps people learn to practice mindfulness. Mindfulness is the practice of intentionally paying attention to the present moment. It can be learned and practiced through techniques such as education, breathing exercises, meditation, and yoga. MBSR includes several mindfulness techniques in one program. MBSR works best when you understand the treatment, are willing to try new things, and can commit to spending time practicing what you learn. MBSR training may include learning about:  How your emotions, thoughts, and reactions affect your body.  New ways to respond to things that cause negative thoughts to start (triggers).  How to notice your thoughts and let go of them.  Practicing awareness of everyday things that you normally do without thinking.  The techniques and goals of  different types of meditation. What are the benefits of MBSR? MBSR can have many benefits, which include helping you to:  Develop self-awareness. This refers to knowing and understanding yourself.  Learn skills and attitudes that help you to participate in your own health care.  Learn new ways to care for yourself.  Be more accepting about how things are, and let things go.  Be less judgmental and approach things with an open mind.  Be patient with yourself and trust yourself more. MBSR has also been shown to:  Reduce negative emotions, such as depression and anxiety.  Improve memory and focus.  Change how you sense and approach pain.  Boost your body's ability to fight infections.  Help you connect better with other people.  Improve your sense of well-being. Follow these instructions at home:   Find a local in-person or online MBSR program.  Set aside some time regularly for mindfulness practice.  Find a mindfulness practice that works best for you. This may include one or more of the following: ? Meditation. Meditation involves focusing your mind on a certain thought or activity. ? Breathing awareness exercises. These help you to stay present by focusing on your breath. ? Body scan. For this practice, you lie down and pay attention to each part of your body from head to toe. You can identify tension and soreness and intentionally relax parts of your body. ? Yoga. Yoga involves stretching and breathing, and it can improve your ability to move and be flexible. It can also provide an experience of testing your body's limits, which can help you release stress. ? Mindful eating. This way of eating involves focusing on the taste, texture, color, and smell  of each bite of food. Because this slows down eating and helps you feel full sooner, it can be an important part of a weight-loss plan.  Find a podcast or recording that provides guidance for breathing awareness, body scan, or  meditation exercises. You can listen to these any time when you have a free moment to rest without distractions.  Follow your treatment plan as told by your health care provider. This may include taking regular medicines and making changes to your diet or lifestyle as recommended. How to practice mindfulness To do a basic awareness exercise:  Find a comfortable place to sit.  Pay attention to the present moment. Observe your thoughts, feelings, and surroundings just as they are.  Avoid placing judgment on yourself, your feelings, or your surroundings. Make note of any judgment that comes up, and let it go.  Your mind may wander, and that is okay. Make note of when your thoughts drift, and return your attention to the present moment. To do basic mindfulness meditation:  Find a comfortable place to sit. This may include a stable chair or a firm floor cushion. ? Sit upright with your back straight. Let your arms fall next to your side with your hands resting on your legs. ? If sitting in a chair, rest your feet flat on the floor. ? If sitting on a cushion, cross your legs in front of you.  Keep your head in a neutral position with your chin dropped slightly. Relax your jaw and rest the tip of your tongue on the roof of your mouth. Drop your gaze to the floor. You can close your eyes if you like.  Breathe normally and pay attention to your breath. Feel the air moving in and out of your nose. Feel your belly expanding and relaxing with each breath.  Your mind may wander, and that is okay. Make note of when your thoughts drift, and return your attention to your breath.  Avoid placing judgment on yourself, your feelings, or your surroundings. Make note of any judgment or feelings that come up, let them go, and bring your attention back to your breath.  When you are ready, lift your gaze or open your eyes. Pay attention to how your body feels after the meditation. Where to find more  information You can find more information about MBSR from:  Your health care provider.  Community-based meditation centers or programs.  Programs offered near you. Summary  Mindfulness-based stress reduction (MBSR) is a program that teaches you how to intentionally pay attention to the present moment. It is used with other treatments to help you cope better with daily stress, emotions, and pain.  MBSR focuses on developing self-awareness, which allows you to respond to life stress without judgment or negative emotions.  MBSR programs may involve learning different mindfulness practices, such as breathing exercises, meditation, yoga, body scan, or mindful eating. Find a mindfulness practice that works best for you, and set aside time for it on a regular basis. This information is not intended to replace advice given to you by your health care provider. Make sure you discuss any questions you have with your health care provider.

## 2019-12-02 ENCOUNTER — Other Ambulatory Visit: Payer: Self-pay | Admitting: Cardiovascular Disease

## 2019-12-03 NOTE — Telephone Encounter (Signed)
Age 54, weight 134kg, SCr 0.89 on 07/13/19, last OV July 2021, indication is popliteal artery thrombosis

## 2019-12-19 ENCOUNTER — Other Ambulatory Visit: Payer: Self-pay | Admitting: Family Medicine

## 2019-12-19 ENCOUNTER — Encounter: Payer: Self-pay | Admitting: Family Medicine

## 2019-12-19 DIAGNOSIS — R7303 Prediabetes: Secondary | ICD-10-CM

## 2020-01-07 ENCOUNTER — Encounter: Payer: Self-pay | Admitting: Family Medicine

## 2020-01-08 DIAGNOSIS — D472 Monoclonal gammopathy: Secondary | ICD-10-CM | POA: Diagnosis not present

## 2020-01-10 ENCOUNTER — Encounter: Payer: Self-pay | Admitting: Family Medicine

## 2020-01-14 ENCOUNTER — Ambulatory Visit (INDEPENDENT_AMBULATORY_CARE_PROVIDER_SITE_OTHER): Payer: BC Managed Care – PPO | Admitting: Family Medicine

## 2020-01-14 ENCOUNTER — Ambulatory Visit: Payer: Self-pay

## 2020-01-14 ENCOUNTER — Other Ambulatory Visit: Payer: Self-pay

## 2020-01-14 ENCOUNTER — Encounter: Payer: Self-pay | Admitting: Family Medicine

## 2020-01-14 VITALS — BP 109/75 | HR 90 | Ht 65.0 in | Wt 285.0 lb

## 2020-01-14 DIAGNOSIS — M1712 Unilateral primary osteoarthritis, left knee: Secondary | ICD-10-CM

## 2020-01-14 DIAGNOSIS — M25562 Pain in left knee: Secondary | ICD-10-CM

## 2020-01-14 MED ORDER — PREDNISONE 5 MG PO TABS: ORAL_TABLET | ORAL | 0 refills | Status: DC

## 2020-01-14 NOTE — Patient Instructions (Signed)
Good to see you Please try the brace  Please try ice  Please try the exercises   Please send me a message in MyChart with any questions or updates.  Please see me back in 4 weeks.   --Dr. Raeford Razor

## 2020-01-14 NOTE — Progress Notes (Signed)
Christina [name] - 54 y.o. female MRN [id number]  Date of birth: [birth year]  SUBJECTIVE:  Including CC & ROS.  Chief Complaint  Patient presents with  . Knee Pain    left x 1 week    Christina [name] is a 54 y.o. female that is presenting with acute left knee pain.  She was getting out of bed and felt her knee bent backwards.  Denies any current mechanical symptoms.  Having improvement of the pain but is still ongoing.  Pain is worse with weightbearing.  No history of surgery.   Review of Systems See HPI   HISTORY: Past Medical, Surgical, Social, and Family History Reviewed & Updated per EMR.   Pertinent Historical Findings include:  Past Medical History:  Diagnosis Date  . Acute blood loss as cause of postoperative anemia   . Anemia 01/22/2019  . Chest pain    a. prior coronary CT without CAD.  Marland Kitchen Elevated sed rate 01/22/2019  . Esophageal dysmotility   . Essential hypertension 07/08/2016  . GERD (gastroesophageal reflux disease)   . MGUS (monoclonal gammopathy of unknown significance)   . Numbness and tingling   . PAD (peripheral artery disease) ([place])    a. 05/2018: PV angio subacute thrombotic occlusion of her popliteal artery. She underwent successful penumbra aspiration thrombectomy, PTA and self-expanding stenting using overlapping [place] self-expanding stents of a long thrombotic occlusion of the popliteal, anterior tibial and tibioperoneal trunk. Procedure c/b pseudoaneurysm/ABL anemia.  . 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[place])   . Uterine fibroid   . Vitamin D deficiency     Past Surgical History:  Procedure Laterality Date  . ABDOMINAL AORTOGRAM W/LOWER EXTREMITY Right 05/08/2018   Procedure: ABDOMINAL AORTOGRAM W/LOWER EXTREMITY;  Surgeon: Lorretta Harp, MD;  Location: [facility];  Service: Cardiovascular;  Laterality: Right;  . BREAST BIOPSY    . BREAST LUMPECTOMY    . LOWER EXTREMITY ANGIOGRAPHY  07/12/2019   Procedure: Lower Extremity Angiography;  Surgeon:  Lorretta Harp, MD;  Location: [facility];  Service: Cardiovascular;;  . lump removed from breast at age 31    . PERIPHERAL VASCULAR BALLOON ANGIOPLASTY  07/12/2019   Procedure: PERIPHERAL VASCULAR BALLOON ANGIOPLASTY;  Surgeon: Lorretta Harp, MD;  Location: [facility];  Service: Cardiovascular;;  . PERIPHERAL VASCULAR INTERVENTION Right 05/08/2018   Procedure: PERIPHERAL VASCULAR INTERVENTION;  Surgeon: Lorretta Harp, MD;  Location: [place];  Service: Cardiovascular;  Laterality: Right;  Anterior tibial and popliteal stents  . PERIPHERAL VASCULAR THROMBECTOMY Right 05/08/2018   Procedure: PERIPHERAL VASCULAR THROMBECTOMY;  Surgeon: Lorretta Harp, MD;  Location: [facility];  Service: Cardiovascular;  Laterality: Right;  Popliteal, tibioperoneal trunk, Anterior tibial, Posterior tibial    Family History  Problem Relation Age of Onset  . Hypertension Mother   . Alzheimer's disease Father   . Healthy Son   . Colon polyps Neg Hx   . Crohn's disease Neg Hx   . Rectal cancer Neg Hx   . Stomach cancer Neg Hx   . Pancreatic cancer Neg Hx     Social History   Socioeconomic History  . Marital status: Single    Spouse name: Not on file  . Number of children: 1  . Years of education: two years of college  . Highest education level: Not on file  Occupational History  . Not on file  Tobacco Use  . Smoking status: Never Smoker  . Smokeless tobacco: Never Used  Vaping Use  . Vaping Use: Never used  Substance and Sexual Activity  . Alcohol use: No  . Drug use: No  . Sexual activity: Yes  Other Topics Concern  . Not on file  Social History Narrative   Lives alone.   Right-handed.   No daily use of caffeine.   Social Determinants of Health   Financial Resource Strain:   . Difficulty of Paying Living Expenses: Not on file  Food Insecurity:   . Worried About Charity fundraiser in the Last Year: Not on file  . Ran Out of Food in the Last  Year: Not on file  Transportation Needs:   . Lack of Transportation (Medical): Not on file  . Lack of Transportation (Non-Medical): Not on file  Physical Activity:   . Days of Exercise per Week: Not on file  . Minutes of Exercise per Session: Not on file  Stress:   . Feeling of Stress : Not on file  Social Connections:   . Frequency of Communication with Friends and Family: Not on file  . Frequency of Social Gatherings with Friends and Family: Not on file  . Attends Religious Services: Not on file  . Active Member of Clubs or Organizations: Not on file  . Attends Archivist Meetings: Not on file  . Marital Status: Not on file  Intimate Partner Violence:   . Fear of Current or Ex-Partner: Not on file  . Emotionally Abused: Not on file  . Physically Abused: Not on file  . Sexually Abused: Not on file     PHYSICAL EXAM:  VS: BP 109/75   Pulse 90   Ht 5\' 5"  (1.651 m)   Wt 285 lb (129.3 kg)   BMI 47.43 kg/m  Physical Exam Gen: NAD, alert, cooperative with exam, well-appearing MSK:  Left knee: No effusion. Range of motion. Tenderness to palpation over the medial joint space. No instability. Neurovascularly intact  Limited ultrasound: Left knee:  No effusion suprapatellar pouch. Normal-appearing quadricep and patellar tendon. Moderate medial joint space narrowing with degenerative changes of the medial meniscus. Small Baker's cyst.  Summary: Degenerative changes of the medial joint space.  Ultrasound and interpretation by Clearance Coots, MD    ASSESSMENT & PLAN:   Primary osteoarthritis of left knee She had abnormal motion of her knee that was likely related to degenerative changes. -Counseled on home exercise therapy and supportive care. -Hinged knee brace. -Prednisone. -Could consider imaging, physical therapy or injection.

## 2020-01-15 DIAGNOSIS — M1712 Unilateral primary osteoarthritis, left knee: Secondary | ICD-10-CM | POA: Insufficient documentation

## 2020-01-15 NOTE — Assessment & Plan Note (Signed)
She had abnormal motion of her knee that was likely related to degenerative changes. -Counseled on home exercise therapy and supportive care. -Hinged knee brace. -Prednisone. -Could consider imaging, physical therapy or injection.

## 2020-01-16 ENCOUNTER — Telehealth: Payer: Self-pay | Admitting: Family Medicine

## 2020-01-16 NOTE — Telephone Encounter (Signed)
Spoke to patient and she is going to come by the office to switch out brace.

## 2020-01-16 NOTE — Telephone Encounter (Signed)
Patient called states her knee brace ( 3x ) is too big, she would like to switch down to the (2x) size--glh

## 2020-01-17 ENCOUNTER — Other Ambulatory Visit: Payer: Self-pay | Admitting: Cardiovascular Disease

## 2020-01-18 ENCOUNTER — Other Ambulatory Visit: Payer: Self-pay

## 2020-01-18 MED ORDER — HYDROCHLOROTHIAZIDE 12.5 MG PO CAPS: 12.5000 mg | ORAL_CAPSULE | Freq: Every day | ORAL | 0 refills | Status: DC

## 2020-01-25 ENCOUNTER — Other Ambulatory Visit: Payer: Self-pay

## 2020-01-25 ENCOUNTER — Ambulatory Visit (HOSPITAL_COMMUNITY)
Admission: RE | Admit: 2020-01-25 | Discharge: 2020-01-25 | Disposition: A | Discharge status: Home / Self Care | Payer: BC Managed Care – PPO | Source: Ambulatory Visit | Attending: Cardiovascular Disease | Admitting: Cardiovascular Disease

## 2020-01-25 DIAGNOSIS — I739 Peripheral vascular disease, unspecified: Secondary | ICD-10-CM | POA: Diagnosis not present

## 2020-01-26 ENCOUNTER — Encounter: Payer: Self-pay | Admitting: Family Medicine

## 2020-01-29 ENCOUNTER — Ambulatory Visit: Payer: BC Managed Care – PPO | Admitting: Family Medicine

## 2020-02-04 ENCOUNTER — Telehealth: Payer: Self-pay | Admitting: Rheumatology

## 2020-02-04 NOTE — Telephone Encounter (Signed)
Lorretta Harp, MD to Christina Police, RN     4:38 PM Yes, OK to have hysterectomy and Colonoscopy at end of year

## 2020-02-04 NOTE — Telephone Encounter (Signed)
Patient calling in reference to [facility]. Patient requesting another order form to be sent to her. She is unable to locate original form. Please mail to patients [address]

## 2020-02-04 NOTE — Telephone Encounter (Signed)
Mailed form to patient.

## 2020-02-07 ENCOUNTER — Encounter: Payer: Self-pay | Admitting: Family Medicine

## 2020-02-07 NOTE — Telephone Encounter (Signed)
Appointment has been canceled and PCP removed.

## 2020-02-12 ENCOUNTER — Ambulatory Visit: Payer: BC Managed Care – PPO | Admitting: Family Medicine

## 2020-02-13 ENCOUNTER — Ambulatory Visit: Payer: BC Managed Care – PPO | Admitting: Family Medicine

## 2020-02-21 NOTE — Progress Notes (Deleted)
Office Visit Note  Patient: Christina [name]             Date of Birth: Jun 21, 1965           MRN: [id number]             PCP: No primary care provider on file. Referring: Libby Maw,* Visit Date: 03/06/2020 Occupation: @GUAROCC @  Subjective:  No chief complaint on file.   History of Present Illness: Christina [name] is a 54 y.o. female ***   Activities of Daily Living:  Patient reports morning stiffness for *** {minute/hour:19697}.   Patient {ACTIONS;DENIES/REPORTS:21021675::"Denies"} nocturnal pain.  Difficulty dressing/grooming: {ACTIONS;DENIES/REPORTS:21021675::"Denies"} Difficulty climbing stairs: {ACTIONS;DENIES/REPORTS:21021675::"Denies"} Difficulty getting out of chair: {ACTIONS;DENIES/REPORTS:21021675::"Denies"} Difficulty using hands for taps, buttons, cutlery, and/or writing: {ACTIONS;DENIES/REPORTS:21021675::"Denies"}  No Rheumatology ROS completed.   PMFS History:  Patient Active Problem List   Diagnosis Date Noted  . Primary osteoarthritis of left knee 01/15/2020  . Paresthesia 08/20/2019  . Hyperlipidemia 07/13/2019  . Claudication in peripheral vascular disease ([place]) 07/12/2019  . Subacromial bursitis of left shoulder joint 04/17/2019  . Acute bilateral low back pain without sciatica 03/22/2019  . OA (osteoarthritis) of shoulder 03/22/2019  . Popping sound of knee joint 03/22/2019  . Stress 03/09/2019  . MGUS (monoclonal gammopathy of unknown significance) 02/26/2019  . Elevated sed rate 01/22/2019  . Anemia 01/22/2019  . Pain in both upper extremities 01/22/2019  . Lightheaded 10/20/2018  . 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[place])   . Acute blood loss as cause of postoperative anemia   . Critical limb ischemia with history of revascularization of same extremity ([place]) 05/08/2018  . Peripheral arterial disease ([place]) 04/28/2018  . Obesity 07/08/2016  . Essential hypertension 07/08/2016  . Pre-diabetes 06/24/2016  . Uterine fibroid   . Vitamin D  deficiency   . Atypical chest pain     Past Medical History:  Diagnosis Date  . Acute blood loss as cause of postoperative anemia   . Anemia 01/22/2019  . Chest pain    a. prior coronary CT without CAD.  [place] Kitchen Elevated sed rate 01/22/2019  . Esophageal dysmotility   . Essential hypertension 07/08/2016  . GERD (gastroesophageal reflux disease)   . MGUS (monoclonal gammopathy of unknown significance)   . Numbness and tingling   . PAD (peripheral artery disease) ([place])    a. 05/2018: PV angio subacute thrombotic occlusion of her popliteal artery. She underwent successful penumbra aspiration thrombectomy, PTA and self-expanding stenting using overlapping Tigris self-expanding stents of a long thrombotic occlusion of the popliteal, anterior tibial and tibioperoneal trunk. Procedure c/b pseudoaneurysm/ABL anemia.  . 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[place])   . Uterine fibroid   . Vitamin D deficiency     Family History  Problem Relation Age of Onset  . Hypertension Mother   . Alzheimer's disease Father   . Healthy Son   . Colon polyps Neg Hx   . Crohn's disease Neg Hx   . Rectal cancer Neg Hx   . Stomach cancer Neg Hx   . Pancreatic cancer Neg Hx    Past Surgical History:  Procedure Laterality Date  . ABDOMINAL AORTOGRAM W/LOWER EXTREMITY Right 05/08/2018   Procedure: ABDOMINAL AORTOGRAM W/LOWER EXTREMITY;  Surgeon: Lorretta Harp, MD;  Location: [facility];  Service: Cardiovascular;  Laterality: Right;  . BREAST BIOPSY    . BREAST LUMPECTOMY    . LOWER EXTREMITY ANGIOGRAPHY  07/12/2019   Procedure: Lower Extremity Angiography;  Surgeon: Lorretta Harp, MD;  Location: [facility];  Service: Cardiovascular;;  . lump removed from breast at age 54    . PERIPHERAL VASCULAR BALLOON ANGIOPLASTY  07/12/2019   Procedure: PERIPHERAL VASCULAR BALLOON ANGIOPLASTY;  Surgeon: Lorretta Harp, MD;  Location: [facility];  Service: Cardiovascular;;  . PERIPHERAL VASCULAR  INTERVENTION Right 05/08/2018   Procedure: PERIPHERAL VASCULAR INTERVENTION;  Surgeon: Lorretta Harp, MD;  Location: [facility];  Service: Cardiovascular;  Laterality: Right;  Anterior tibial and popliteal stents  . PERIPHERAL VASCULAR THROMBECTOMY Right 05/08/2018   Procedure: PERIPHERAL VASCULAR THROMBECTOMY;  Surgeon: Lorretta Harp, MD;  Location: [facility];  Service: Cardiovascular;  Laterality: Right;  Popliteal, tibioperoneal trunk, Anterior tibial, Posterior tibial   Social History   Social History Narrative   Lives alone.   Right-handed.   No daily use of caffeine.   Immunization History  Administered Date(s) Administered  . Influenza,inj,Quad PF,6+ Mos 01/31/2017, 04/03/2018, 12/27/2018  . Influenza-Unspecified 06/23/2013  . PFIZER SARS-COV-2 Vaccination 07/22/2019, 08/12/2019  . Tdap 03/30/2017     Objective: Vital Signs: There were no vitals taken for this visit.   Physical Exam   Musculoskeletal Exam: ***  CDAI Exam: CDAI Score: -- Patient Global: --; Provider Global: -- Swollen: --; Tender: -- Joint Exam 03/06/2020   No joint exam has been documented for this visit   There is currently no information documented on the homunculus. Go to the Rheumatology activity and complete the homunculus joint exam.  Investigation: No additional findings.  Imaging: VAS [place] ABI WITH/WO TBI  Result Date: 01/27/2020 LOWER EXTREMITY DOPPLER STUDY Indications: Peripheral artery disease follow-up. Patient says her leg is              feeling great s/p angioplasty of the right popliteal/TP trunk              stent. She is able to walk without pain and is able to keep up with              her grandkids. High Risk Factors: Hypertension, hyperlipidemia, no history of smoking.  Vascular Interventions: Successful right popliteal and anterior tibial artery                         chocolate balloon angioplasty followed by drug-coated                         balloon  angioplasty for aggressive in-stent restenosis                         of overlapping Tigris stents placed in the setting of                         acute limb ischemia 05/08/2018. Comparison Study: Previous ABIs performed 08/01/18 were 1.10 on the right and                   1.19 on the left. Performing Technologist: Mariane Masters RVT  Examination Guidelines: A complete evaluation includes at minimum, Doppler waveform signals and systolic blood pressure reading at the level of bilateral brachial, anterior tibial, and posterior tibial arteries, when vessel segments are accessible. Bilateral testing is considered an integral part of a complete examination. Photoelectric Plethysmograph (PPG) waveforms and toe systolic pressure readings are included as required and additional duplex testing as needed. Limited examinations for reoccurring indications may be performed as noted.  ABI Findings: +---------+------------------+-----+---------+-----------------+ Right    Rt Pressure (mmHg)IndexWaveform Comment           +---------+------------------+-----+---------+-----------------+ Brachial 147                                               +---------+------------------+-----+---------+-----------------+ ATA      154               1.05 triphasic                  +---------+------------------+-----+---------+-----------------+ PTA      141               0.96 triphasic                  +---------+------------------+-----+---------+-----------------+ PERO     145               0.99 biphasic difficult to hear +---------+------------------+-----+---------+-----------------+ Great Toe133               0.90 Normal                     +---------+------------------+-----+---------+-----------------+ +---------+------------------+-----+---------+------------------------+ Left     Lt Pressure (mmHg)IndexWaveform Comment                   +---------+------------------+-----+---------+------------------------+ Brachial 135                                                      +---------+------------------+-----+---------+------------------------+ ATA      163               1.11 triphasic                         +---------+------------------+-----+---------+------------------------+ PTA      130               0.88 triphasic                         +---------+------------------+-----+---------+------------------------+ PERO     165               1.12 biphasic difficult to hear/record +---------+------------------+-----+---------+------------------------+ Doristine Devoid Toe111               0.76 Normal                            +---------+------------------+-----+---------+------------------------+ +-------+-----------+-----------+------------+------------+ ABI/TBIToday's ABIToday's TBIPrevious ABIPrevious TBI +-------+-----------+-----------+------------+------------+ Right  1.05       0.90       1.10        0.85         +-------+-----------+-----------+------------+------------+ Left   1.11       0.76       1.19        0.83         +-------+-----------+-----------+------------+------------+ Bilateral ABIs and TBIs appear essentially unchanged compared to prior study on 08/01/18.  Summary: Right: Resting right ankle-brachial index is within normal range. No evidence of significant right lower extremity arterial disease. The right toe-brachial index is normal. Left: Resting left ankle-brachial index is within normal range. No evidence of significant left  lower extremity arterial disease. The left toe-brachial index is normal.  *See table(s) above for measurements and observations. See arterial duplex report.  Suggest follow up study in 12 months. Electronically signed by Carlyle Dolly MD on 01/27/2020 at 11:20:50 AM.    Final    VAS [place] LOWER EXTREMITY ARTERIAL DUPLEX  Result Date: 01/27/2020 LOWER EXTREMITY  ARTERIAL DUPLEX STUDY Indications: Peripheral artery disease follow-up. Patient says her leg is              feeling great s/p angioplasty of the right popliteal/TP trunk              stent. She is able to walk without pain and is able to keep up with              her grandkids. High Risk Factors: Hypertension, hyperlipidemia, no history of smoking.  Vascular Interventions: Successful right popliteal and anterior tibial artery                         angioplasty followed by drug-coated balloon angioplasty                         for aggressive in-stent restenosis of overlapping Tigris                         stents placed in the setting of acute limb ischemia                         05/08/2018. Current ABI:            Today's ABIs are 1.05 on the right and 1.11 on the left. Comparison Study: Previous arterial duplex performed 08/01/18 showed widely                   patent right lower extremity arteries and popliteal/TP trunk,                   s/p angioplasty. Performing Technologist: Mariane Masters RVT  Examination Guidelines: A complete evaluation includes B-mode imaging, spectral Doppler, color Doppler, and power Doppler as needed of all accessible portions of each vessel. Bilateral testing is considered an integral part of a complete examination. Limited examinations for reoccurring indications may be performed as noted.  +----------+--------+-----+--------+---------+--------+ RIGHT     PSV cm/sRatioStenosisWaveform Comments +----------+--------+-----+--------+---------+--------+ CFA Prox  142                  triphasic         +----------+--------+-----+--------+---------+--------+ SFA Prox  125                  triphasic         +----------+--------+-----+--------+---------+--------+ SFA Mid   116                  triphasic         +----------+--------+-----+--------+---------+--------+ SFA Distal73                   triphasic          +----------+--------+-----+--------+---------+--------+ POP Prox  82                   triphasic         +----------+--------+-----+--------+---------+--------+ TP Trunk  143                  triphasic         +----------+--------+-----+--------+---------+--------+  ATA Prox  103                  triphasic         +----------+--------+-----+--------+---------+--------+ ATA Mid   58                   triphasic         +----------+--------+-----+--------+---------+--------+ ATA Distal59                   triphasic         +----------+--------+-----+--------+---------+--------+ PTA Prox  53                   triphasic         +----------+--------+-----+--------+---------+--------+ PTA Mid   62                   triphasic         +----------+--------+-----+--------+---------+--------+ PTA Distal48                   triphasic         +----------+--------+-----+--------+---------+--------+ Peroneal artery not able to ve visualized.  Right Stent(s): +------------------------+--------+--------+---------+-------------------------+ popliteal artery/TP     PSV cm/sStenosisWaveform Comments                  trunk                                                                      +------------------------+--------+--------+---------+-------------------------+ Prox to Stent           107             triphasic                          +------------------------+--------+--------+---------+-------------------------+ Proximal Stent          99              triphasic                          +------------------------+--------+--------+---------+-------------------------+ Mid Stent               114             triphasic                          +------------------------+--------+--------+---------+-------------------------+ Distal Stent            144             triphasic                           +------------------------+--------+--------+---------+-------------------------+ Distal to Stent         163             triphasicmildly elevated                                                            velocities                +------------------------+--------+--------+---------+-------------------------+  Summary: Right: Patent right popliteal stent and anterior tibial artery, s/p angioplasty. Mildly elevated velocities distal to the stent. No significant change compared to previous exam.  See table(s) above for measurements and observations. See ABI report. Suggest follow up study in 12 months. Electronically signed by Carlyle Dolly MD on 01/27/2020 at 11:59:49 AM.    Final     Recent Labs: Lab Results  Component Value Date   WBC 8.8 07/13/2019   HGB 11.3 (L) 07/13/2019   PLT 225 07/13/2019   NA 138 07/13/2019   K 3.9 07/13/2019   CL 104 07/13/2019   CO2 22 07/13/2019   GLUCOSE 104 (H) 07/13/2019   BUN 13 07/13/2019   CREATININE 0.89 07/13/2019   BILITOT 0.8 03/02/2019   ALKPHOS 89 03/02/2019   AST 14 (L) 03/02/2019   ALT 15 03/02/2019   PROT 7.8 03/02/2019   ALBUMIN 4.5 03/02/2019   CALCIUM 9.0 07/13/2019   GFRAA >60 07/13/2019    Speciality Comments: No specialty comments available.  Procedures:  No procedures performed Allergies: Lisinopril   Assessment / Plan:     Visit Diagnoses: No diagnosis found.  Orders: No orders of the defined types were placed in this encounter.  No orders of the defined types were placed in this encounter.   Face-to-face time spent with patient was *** minutes. Greater than 50% of time was spent in counseling and coordination of care.  Follow-Up Instructions: No follow-ups on file.   Earnestine Mealing, CMA  Note - This record has been created using Editor, commissioning.  Chart creation errors have been sought, but may not always  have been located. Such creation errors do not reflect on  the standard of medical care.

## 2020-02-27 ENCOUNTER — Encounter: Payer: Self-pay | Admitting: Obstetrics & Gynecology

## 2020-02-27 ENCOUNTER — Ambulatory Visit (INDEPENDENT_AMBULATORY_CARE_PROVIDER_SITE_OTHER): Payer: BC Managed Care – PPO | Admitting: Obstetrics & Gynecology

## 2020-02-27 ENCOUNTER — Other Ambulatory Visit: Payer: Self-pay

## 2020-02-27 ENCOUNTER — Other Ambulatory Visit (HOSPITAL_COMMUNITY)
Admission: RE | Admit: 2020-02-27 | Discharge: 2020-02-27 | Disposition: A | Discharge status: Home / Self Care | Payer: BC Managed Care – PPO | Source: Ambulatory Visit | Attending: Obstetrics & Gynecology | Admitting: Obstetrics & Gynecology

## 2020-02-27 VITALS — BP 135/86 | HR 97 | Ht 65.0 in | Wt 290.0 lb

## 2020-02-27 DIAGNOSIS — D219 Benign neoplasm of connective and other soft tissue, unspecified: Secondary | ICD-10-CM | POA: Diagnosis not present

## 2020-02-27 DIAGNOSIS — Z01419 Encounter for gynecological examination (general) (routine) without abnormal findings: Secondary | ICD-10-CM | POA: Diagnosis not present

## 2020-02-27 DIAGNOSIS — N939 Abnormal uterine and vaginal bleeding, unspecified: Secondary | ICD-10-CM

## 2020-02-27 MED ORDER — MEGESTROL ACETATE 40 MG PO TABS: 40.0000 mg | ORAL_TABLET | Freq: Every day | ORAL | 3 refills | Status: DC

## 2020-02-27 NOTE — Patient Instructions (Signed)
Uterine Artery Embolization for Fibroids  Uterine artery embolization is a procedure to shrink uterine fibroids. Uterine fibroids are masses of tissue (tumors) that can develop in the womb (uterus). They are also called leiomyomas. This type of tumor is not cancerous (benign) and does not spread to other parts of the body outside of the pelvic area. The pelvic area is the part of the body between the hip bones. You can have one or many fibroids. Fibroids can vary in size, shape, weight, and where they grow in the uterus. Some can become quite large. In this procedure, a thin plastic tube (catheter) is used to inject a chemical that blocks off the blood supply to the fibroid, which causes the fibroid to shrink. Tell a health care provider about:  Any allergies you have.  All medicines you are taking, including vitamins, herbs, eye drops, creams, and over-the-counter medicines.  Any problems you or family members have had with anesthetic medicines.  Any blood disorders you have.  Any surgeries you have had.  Any medical conditions you have.  Whether you are pregnant or may be pregnant. What are the risks? Generally, this is a safe procedure. However, problems may occur, including:  Bleeding.  Allergic reactions to medicines or dyes.  Damage to other structures or organs.  Infection, including blood infection (septicemia).  Injury to the uterus from decreased blood supply.  Lack of menstrual periods (amenorrhea).  Death of tissue cells (necrosis) around your bladder or vulva.  Development of a hole between organs or from an organ to the surface of your skin (fistula).  Blood clot in the legs (deep vein thrombosis) or lung (pulmonary embolus).  Nausea and vomiting. What happens before the procedure? Staying hydrated Follow instructions from your health care provider about hydration, which may include:  Up to 2 hours before the procedure - you may continue to drink clear  liquids, such as water, clear fruit juice, black coffee, and plain tea. Eating and drinking restrictions Follow instructions from your health care provider about eating and drinking, which may include:  8 hours before the procedure - stop eating heavy meals or foods such as meat, fried foods, or fatty foods.  6 hours before the procedure - stop eating light meals or foods, such as toast or cereal.  6 hours before the procedure - stop drinking milk or drinks that contain milk.  2 hours before the procedure - stop drinking clear liquids. Medicines  Ask your health care provider about: ? Changing or stopping your regular medicines. This is especially important if you are taking diabetes medicines or blood thinners. ? Taking over-the-counter medicines, vitamins, herbs, and supplements. ? Taking medicines such as aspirin and ibuprofen. These medicines can thin your blood. Do not take these medicines unless your health care provider tells you to take them.  You may be given antibiotic medicine to help prevent infection.  You may be given medicine to prevent nausea and vomiting (antiemetic). General instructions  Ask your health care provider how your surgical site will be marked or identified.  You may be asked to shower with a germ-killing soap.  Plan to have someone take you home from the hospital or clinic.  If you will be going home right after the procedure, plan to have someone with you for 24 hours.  You will be asked to empty your bladder. What happens during the procedure?  To lower your risk of infection: ? Your health care team will wash or sanitize their hands. ?   Hair may be removed from the surgical area. ? Your skin will be washed with soap.  An IV will be inserted into one of your veins.  You will be given one or more of the following: ? A medicine to help you relax (sedative). ? A medicine to numb the area (local anesthetic).  A small cut (incision) will be made  in your groin.  A catheter will be inserted into the main artery of your leg. The catheter will be guided through the artery to your uterus.  A series of images will be taken while dye is injected through the catheter in your groin. X-rays are taken at the same time. This is done to provide a road map of the blood supply to your uterus and fibroids.  Tiny plastic spheres, about the size of sand grains, will be injected through the catheter. Metal coils may be used to help block the artery. The particles will lodge in tiny branches of the uterine artery that supplies blood to the fibroids.  The procedure will be repeated on the artery that supplies the other side of the uterus.  The catheter will be removed and pressure will be applied to stop the bleeding.  A dressing will be placed over the incision. The procedure may vary among health care providers and hospitals. What happens after the procedure?  Your blood pressure, heart rate, breathing rate, and blood oxygen level will be monitored until the medicines you were given have worn off.  You will be given pain medicine as needed.  You may be given medicine for nausea and vomiting as needed.  Do not drive for 24 hours if you were given a sedative. Summary  Uterine artery embolization is a procedure to shrink uterine fibroids by blocking the blood supply to the fibroid.  You may be given a sedative and local anesthetic for the procedure.  A catheter will be inserted into the main artery of your leg. The catheter will be guided through the artery to your uterus.  After the procedure you will be given pain medicine and medicine for nausea as needed.  Do not drive for 24 hours if you were given a sedative. This information is not intended to replace advice given to you by your health care provider. Make sure you discuss any questions you have with your health care provider. Document Revised: 04/01/2017 Document Reviewed:  07/22/2016 [employer]vier Patient Education  2020 [employer]  

## 2020-02-27 NOTE — Progress Notes (Signed)
Subjective:     Christina [name] is a 54 y.o. female here for a routine exam.  Current complaints: Pt reports that she has not had bleeding for >1 year. She is afraid to stop the Megace for fear of bleeding. She reports that when she last stopped the megace, she had heavy bleeding and she is afraid to stop it. She reports that she wants a hysterectomy but, she still declines to do a endometrial biopsy. She denies abd pain or other GYN issues.   She requests an annual PAP. She does not want to do the every 3 year PAP.         Pt has had her Covid vaccine plus the booster shot.    Gynecologic History No LMP recorded. (Menstrual status: Perimenopausal). Contraception: post menopausal status Last Pap: 02/14/2019. Results were: normal Last mammogram: 10/22/2019. Results were: normal  Obstetric History OB History  Gravida Para Term Preterm AB Living  1 1          SAB TAB Ectopic Multiple Live Births          1    # Outcome Date GA Lbr Len/2nd Weight Sex Delivery Anes PTL Lv  1 Para      CS-Unspec      The following portions of the patient's history were reviewed and updated as appropriate: allergies, current medications, past family history, past medical history, past social history, past surgical history and problem list.  Review of Systems Pertinent items are noted in HPI.    Objective:  BP 135/86   Pulse 97   Ht 5\' 5"  (1.651 m)   Wt 290 lb (131.5 kg)   BMI 48.26 kg/m  General Appearance:    Alert, cooperative, no distress, appears stated age  Head:    Normocephalic, without obvious abnormality, atraumatic  Eyes:    conjunctiva/corneas clear, EOM's intact, both eyes  Ears:    Normal external ear canals, both ears  Nose:   Nares normal, septum midline, mucosa normal, no drainage    or sinus tenderness  Throat:   Lips, mucosa, and tongue normal; teeth and gums normal  Neck:   Supple, symmetrical, trachea midline, no adenopathy;    thyroid:  no enlargement/tenderness/nodules  Back:      Symmetric, no curvature, ROM normal, no CVA tenderness  Lungs:     respirations unlabored  Chest Wall:    No tenderness or deformity   Heart:    Regular rate and rhythm  Breast Exam:    No tenderness, masses, or nipple abnormality  Abdomen:     Soft, non-tender, bowel sounds active all four quadrants,    no masses, no organomegaly  Genitalia:    Normal female without lesion, discharge or tenderness   Uterus 12-14 weeks sized; mobile, No adnexal masses appreciated.   Extremities:   Extremities normal, atraumatic, no cyanosis or edema  Pulses:   2+ and symmetric all extremities  Skin:   Skin color, texture, turgor normal, no rashes or lesions   02/16/2019 CLINICAL DATA:  Abnormal uterine bleeding  EXAM: TRANSABDOMINAL AND TRANSVAGINAL ULTRASOUND OF PELVIS  TECHNIQUE: Both transabdominal and transvaginal ultrasound examinations of the pelvis were performed. Transabdominal technique was performed for global imaging of the pelvis including uterus, ovaries, adnexal regions, and pelvic cul-de-sac. It was necessary to proceed with endovaginal exam following the transabdominal exam to visualize the uterus, endometrium and ovaries.  COMPARISON:  10/19/2012  FINDINGS: Uterus  Measurements: 14.5 x 9.3 x 11.3 cm = volume: 803  mL. Multiple fibroids are identified.  Dominant fibroid arises from the left side of uterus measures 8.8 x 8.0 x 7.5 cm  Anterior fundal fibroid measures 2.8 x 2.7 x 2.5 cm.  Endometrium  Thickness: Endometrial stripe is not confidently identified. Fluid distension of the fundal endometrial canal is identified. No focal abnormality visualized.  Right ovary  Measurements: Not visualized.  No adnexal mass.  Left ovary  Measurements: Not visualized.  No adnexal mass noted.  Other findings  No abnormal free fluid.  IMPRESSION: 1. Enlarged fibroid uterus with a volume of approximately 803 cc. The dominant fibroid arises from the left  side of uterus with a maximum dimension of 8.8 cm and may have mass effect upon the endometrial cavity evident by fluid within the fundal portion of the canal. 2. The endometrial stripe is not confidently identified and is largely obscured secondary to multiple fibroids. If bleeding remains unresponsive to hormonal or medical therapy, sonohysterogram should be considered for focal lesion work-up. (Ref: Radiological Reasoning: Algorithmic Workup of Abnormal Vaginal Bleeding with Endovaginal Sonography and Sonohysterography. AJR 2008; [id number]:F02-77) 3. Nonvisualization of the ovaries.  Assessment:    Healthy female exam.   Uterine fibroids.  No bleeding or pain noted. They palpate smaller than prev.     Fibroids- I have reviewed with pt the management options including uterine artery embolization. I have explained to pt that whatever she has will require an endometrial biopsy and I have again shared with her that this can be done in the OR under anesthesia. She wishes to think about it and follow up with me once she decides.    Plan:  [place] was seen today for gynecologic exam.  Diagnoses and all orders for this visit:  Fibroids -     megestrol (MEGACE) 40 MG tablet; Take 1 tablet (40 mg total) by mouth daily in the afternoon. -     US PELVIS (TRANSABDOMINAL ONLY); Future  Abnormal uterine bleeding (AUB) -     megestrol (MEGACE) 40 MG tablet; Take 1 tablet (40 mg total) by mouth daily in the afternoon. -     US PELVIS (TRANSABDOMINAL ONLY); Future  Well female exam with routine gynecological exam -     Cytology - PAP  f/u in 6 weeks or sooner prn  Rufina Kimery L. Harraway-Smith, M.D., Cherlynn June

## 2020-02-28 ENCOUNTER — Telehealth: Payer: Self-pay | Admitting: Rheumatology

## 2020-02-28 NOTE — Telephone Encounter (Signed)
I would recommend an office visit as she has not been seen in the office since October 2020.  If she cannot make it then we can do virtual visit.

## 2020-02-28 NOTE — Telephone Encounter (Signed)
Patient has a rov 03/05/2020. Patient wants to know if she can do a virtual visit instead? Please call to advise.

## 2020-02-29 ENCOUNTER — Telehealth: Payer: Self-pay

## 2020-02-29 LAB — CYTOLOGY - PAP
Comment: NEGATIVE
Diagnosis: NEGATIVE
High risk HPV: NEGATIVE

## 2020-02-29 NOTE — Telephone Encounter (Signed)
Patient states the only reason she was scheduling a virtual appointment was to go over the results of her labwork that she is having this weekend.  Patient states at her last appointment Dr. Estanislado Pandy told her she didn't need to see her back in the office unless her symptoms worsened.  Please advise.

## 2020-02-29 NOTE — Telephone Encounter (Signed)
Patient states she is having AVISE labs performed on 03/01/2020. Patient advised she does need to be seen in the office as we have not seen her in the office since 02/2019. Patient has been rescheduled until 03/20/2020 to give time for her lab results to return. Patient expressed understanding.

## 2020-03-01 DIAGNOSIS — R768 Other specified abnormal immunological findings in serum: Secondary | ICD-10-CM | POA: Diagnosis not present

## 2020-03-04 ENCOUNTER — Other Ambulatory Visit: Payer: Self-pay

## 2020-03-04 ENCOUNTER — Ambulatory Visit (HOSPITAL_BASED_OUTPATIENT_CLINIC_OR_DEPARTMENT_OTHER)
Admission: RE | Admit: 2020-03-04 | Discharge: 2020-03-04 | Disposition: A | Discharge status: Home / Self Care | Payer: BC Managed Care – PPO | Source: Ambulatory Visit | Attending: Obstetrics & Gynecology | Admitting: Obstetrics & Gynecology

## 2020-03-04 DIAGNOSIS — D219 Benign neoplasm of connective and other soft tissue, unspecified: Secondary | ICD-10-CM

## 2020-03-04 DIAGNOSIS — D259 Leiomyoma of uterus, unspecified: Secondary | ICD-10-CM | POA: Diagnosis not present

## 2020-03-04 DIAGNOSIS — N939 Abnormal uterine and vaginal bleeding, unspecified: Secondary | ICD-10-CM | POA: Insufficient documentation

## 2020-03-06 ENCOUNTER — Ambulatory Visit: Payer: BC Managed Care – PPO | Admitting: Rheumatology

## 2020-03-06 DIAGNOSIS — T81718A Complication of other artery following a procedure, not elsewhere classified, initial encounter: Secondary | ICD-10-CM

## 2020-03-06 DIAGNOSIS — R700 Elevated erythrocyte sedimentation rate: Secondary | ICD-10-CM

## 2020-03-06 DIAGNOSIS — R7303 Prediabetes: Secondary | ICD-10-CM

## 2020-03-06 DIAGNOSIS — I10 Essential (primary) hypertension: Secondary | ICD-10-CM

## 2020-03-06 DIAGNOSIS — R768 Other specified abnormal immunological findings in serum: Secondary | ICD-10-CM

## 2020-03-06 DIAGNOSIS — M1711 Unilateral primary osteoarthritis, right knee: Secondary | ICD-10-CM

## 2020-03-06 DIAGNOSIS — M5030 Other cervical disc degeneration, unspecified cervical region: Secondary | ICD-10-CM

## 2020-03-06 DIAGNOSIS — E559 Vitamin D deficiency, unspecified: Secondary | ICD-10-CM

## 2020-03-06 DIAGNOSIS — D472 Monoclonal gammopathy: Secondary | ICD-10-CM

## 2020-03-06 DIAGNOSIS — I739 Peripheral vascular disease, unspecified: Secondary | ICD-10-CM

## 2020-03-06 DIAGNOSIS — R200 Anesthesia of skin: Secondary | ICD-10-CM

## 2020-03-14 ENCOUNTER — Telehealth: Payer: Self-pay

## 2020-03-14 ENCOUNTER — Other Ambulatory Visit: Payer: Self-pay | Admitting: Family Medicine

## 2020-03-14 DIAGNOSIS — R7303 Prediabetes: Secondary | ICD-10-CM

## 2020-03-14 DIAGNOSIS — R768 Other specified abnormal immunological findings in serum: Secondary | ICD-10-CM | POA: Diagnosis not present

## 2020-03-14 NOTE — Telephone Encounter (Signed)
Called pt. Pt made aware that she will be having an endometrial bx at her appt on 04/22/20. Pt states she would like to be numb but does not want medication because she does not have anyone to drive he Will send a message to the provider.  chiquita l wilson, CMA

## 2020-03-17 ENCOUNTER — Other Ambulatory Visit: Payer: Self-pay | Admitting: Family Medicine

## 2020-03-17 ENCOUNTER — Telehealth: Payer: Self-pay

## 2020-03-17 DIAGNOSIS — R7303 Prediabetes: Secondary | ICD-10-CM

## 2020-03-17 NOTE — Telephone Encounter (Signed)
[place] labs received

## 2020-03-17 NOTE — Telephone Encounter (Signed)
Patient called stating she had her AVISE labwork on 05/15/19.  Patient is requesting a return call when her results are back so she can schedule a virtual appointment to go over the results.

## 2020-03-17 NOTE — Telephone Encounter (Signed)
Patient states that she is no longer a patient of Dr. Bebe Shaggy, but she has not found another PCP. She wants to know if he will refill her Metformin because she is completely out. If approved, send to [facility] on [address] and call her at [phone].

## 2020-03-17 NOTE — Telephone Encounter (Signed)
Please advise message below refill request for pending medication. Last OV 5/20-21

## 2020-03-18 ENCOUNTER — Telehealth: Payer: Self-pay

## 2020-03-18 NOTE — Telephone Encounter (Signed)
Patient is calling to back to check the status of her Metformin refill. Please call her back at [phone] to let her know if/when it has been sent in.

## 2020-03-18 NOTE — Telephone Encounter (Signed)
Patient cancelled appointment on Apr 21, 2020 for her endometrial biopsy in the office due to she wont be able to have results and get her surgery before the end of 2021. Patient states that she would like a call from the provider to follow up but doesn't want to go through [facility] if she cant have surgery before the end of 2021.   Patient states she will continue her megace for her bleeding. Again patient asked if the doctor (Dr. Ihor Dow) could just call her and speak to her one day coming up. Kathrene Alu RN

## 2020-03-18 NOTE — Telephone Encounter (Signed)
I called patient, patient would like a copy of [place] labs mailed to her after Dr. Estanislado Pandy reviews labs.

## 2020-03-19 NOTE — Telephone Encounter (Signed)
Patient was a patient of Dr. Bebe Shaggy she is in the process of finding a new Provider have not found one last visit with Dr. Ethelene Hal was May 2021. Patient out of Metformin and would like to know if this refill could be sent in for her until she finds a new PCP? Please advise.

## 2020-03-19 NOTE — Telephone Encounter (Signed)
If she is having any symptoms she should be seen in the office.  If she has no symptoms and wants to discuss labs then we can do a virtual visit.

## 2020-03-19 NOTE — Telephone Encounter (Signed)
Patient is calling back regarding her medications. She states that her Potassium was approved and called in, so she's not sure why she hasn't been able to get her Metformin. Please advise.

## 2020-03-19 NOTE — Telephone Encounter (Signed)
Unfortunately I am not able to refill her med since she has not been a patient in our office since 02/07/20 - see MyChart message.

## 2020-03-19 NOTE — Telephone Encounter (Signed)
Patient has had the [facility] labs redraw as they were not able to run a portion of the test. Patient would like a virtual visit once resulted. Patient has had two virtual visits this year. Last seen in office on 02/28/2019. Please advise.

## 2020-03-20 ENCOUNTER — Telehealth: Payer: Self-pay | Admitting: Obstetrics & Gynecology

## 2020-03-20 ENCOUNTER — Ambulatory Visit: Payer: BC Managed Care – PPO | Admitting: Rheumatology

## 2020-03-20 NOTE — Telephone Encounter (Signed)
TC to pt. She has declined again to come in for the endo bx.  There was no answer. I left a message for her to call back.   Marsela Kuan L. Harraway-Smith, M.D., Cherlynn June

## 2020-03-21 ENCOUNTER — Other Ambulatory Visit: Payer: Self-pay | Admitting: Family Medicine

## 2020-03-21 NOTE — Telephone Encounter (Signed)
Last OV 09/20/19 Last fill 12/20/19  #90/0 Patient need a to establish care with a Primary Provider before any other refills.

## 2020-03-23 ENCOUNTER — Other Ambulatory Visit: Payer: Self-pay | Admitting: Family Medicine

## 2020-03-24 NOTE — Telephone Encounter (Signed)
Last OV 09/20/19 Last fill 03/21/20  #90/0

## 2020-03-28 ENCOUNTER — Other Ambulatory Visit: Payer: Self-pay | Admitting: Cardiovascular Disease

## 2020-03-28 NOTE — Telephone Encounter (Signed)
LM on VM informing patient of message below.

## 2020-03-29 ENCOUNTER — Telehealth: Payer: Self-pay | Admitting: Family Medicine

## 2020-03-29 DIAGNOSIS — T07XXXA Unspecified multiple injuries, initial encounter: Secondary | ICD-10-CM

## 2020-03-29 DIAGNOSIS — E559 Vitamin D deficiency, unspecified: Secondary | ICD-10-CM

## 2020-03-31 ENCOUNTER — Telehealth: Payer: Self-pay

## 2020-03-31 ENCOUNTER — Other Ambulatory Visit: Payer: Self-pay

## 2020-03-31 MED ORDER — CARVEDILOL 6.25 MG PO TABS
6.2500 mg | ORAL_TABLET | Freq: Two times a day (BID) | ORAL | 2 refills | Status: DC
Start: 2020-03-31 — End: 2021-02-12

## 2020-03-31 NOTE — Telephone Encounter (Signed)
Pt called in and lab appt has been scheduled.   Pt sent mychart 02/07/20 that she changed providers/unhappy. I asked pt is she wanted to see new PCP and she said she hasn't setup a new provider. Pt does not want to re-est about [facility].

## 2020-03-31 NOTE — Telephone Encounter (Signed)
Yes enter order for repeat vit. D prior to refilling medication. Thank you

## 2020-03-31 NOTE — Telephone Encounter (Signed)
Do not refill medication. Wait for lab results

## 2020-03-31 NOTE — Telephone Encounter (Addendum)
Patient advised per Dr. Estanislado Pandy if she is having symptoms we should see her in the office. Patient advised if she is not will can schedule a virtual visit. Patient states she is not having any symptoms. Patient advised we have not received her AVISE lab results. Patient advised we will call to schedule her virtual visit once received. Patient expressed understanding.

## 2020-03-31 NOTE — Telephone Encounter (Signed)
Patient called checking on the results from her [place] she had 2 weeks ago.  Patient is requesting a return call.

## 2020-03-31 NOTE — Telephone Encounter (Signed)
[place] please advise, pt last ov with Dr. Ethelene Hal on 09/2019 and Vit D level check was 33.66--to continue high does of Vit D per Dr. Ethelene Hal.   Do do you want pt to come in for Vit D Level check again? No future appt set up with Dr. Ethelene Hal.

## 2020-03-31 NOTE — Telephone Encounter (Signed)
Pt stated she will call back to schedule a lab appt.   Ordered in the system as provider request, advised the pt to call back and schedule lab work prior to send in refill for this med.

## 2020-03-31 NOTE — Telephone Encounter (Signed)
Patient advised we have not received her AVISE lab results. Patient advised we will call to schedule her virtual visit once received. Patient expressed understanding.

## 2020-04-04 ENCOUNTER — Other Ambulatory Visit: Payer: BC Managed Care – PPO

## 2020-04-04 DIAGNOSIS — E559 Vitamin D deficiency, unspecified: Secondary | ICD-10-CM | POA: Diagnosis not present

## 2020-04-04 DIAGNOSIS — R252 Cramp and spasm: Secondary | ICD-10-CM | POA: Diagnosis not present

## 2020-04-04 DIAGNOSIS — D472 Monoclonal gammopathy: Secondary | ICD-10-CM | POA: Diagnosis not present

## 2020-04-04 DIAGNOSIS — I10 Essential (primary) hypertension: Secondary | ICD-10-CM | POA: Diagnosis not present

## 2020-04-04 DIAGNOSIS — R7303 Prediabetes: Secondary | ICD-10-CM | POA: Diagnosis not present

## 2020-04-04 DIAGNOSIS — R5383 Other fatigue: Secondary | ICD-10-CM | POA: Diagnosis not present

## 2020-04-04 DIAGNOSIS — I739 Peripheral vascular disease, unspecified: Secondary | ICD-10-CM | POA: Diagnosis not present

## 2020-04-08 ENCOUNTER — Ambulatory Visit: Payer: BC Managed Care – PPO | Admitting: Gastroenterology

## 2020-04-08 NOTE — Progress Notes (Signed)
Virtual Visit via Telephone Note  I connected with Christina [name] on 04/09/20 at  4:00 PM EST by telephone and verified that I am speaking with the correct person using two identifiers.  Location: Patient: Home  Provider: Clinic  This service was conducted via virtual visit.  The patient was located at home. I was located in my office.  Consent was obtained prior to the virtual visit and is aware of possible charges through their insurance for this visit.  The patient is an established patient.  Dr. Estanislado Pandy, MD conducted the virtual visit and [name], PA-C acted as scribe during the service.  Office staff helped with scheduling follow up visits after the service was conducted.     I discussed the limitations, risks, security and privacy concerns of performing an evaluation and management service by telephone and the availability of in person appointments. I also discussed with the patient that there may be a patient responsible charge related to this service. The patient expressed understanding and agreed to proceed.  CC: Discuss AVISE results History of Present Illness: Patient is a 54 year old female with a past medical history of positive ANA, osteoarthritis, and DDD. Appointment today scheduled to discuss AVISE lab results. She denies any new or worsening symptoms.  She has not had any oral or nasal ulcerations.  She denies any chest pain, palpitations, and shortness of breath.  She denies any symptoms of raynaud's.  She denies any knee joint pain or joint swelling.  She denies any neck pain or stiffness.   She is followed by Dr. Krista Blue for monitoring of lab work for MGUS.  Review of Systems  Constitutional: Negative for fever and malaise/fatigue.  HENT: Negative for ear pain.   Eyes: Negative for photophobia, pain, discharge and redness.  Respiratory: Negative for cough, shortness of breath and wheezing.   Cardiovascular: Negative for chest pain and palpitations.  Gastrointestinal:  Negative for blood in stool, constipation and diarrhea.  Genitourinary: Negative for dysuria.  Musculoskeletal: Negative for back pain, joint pain, myalgias and neck pain.  Skin: Negative for rash.  Neurological: Negative for dizziness, weakness and headaches.  Endo/Heme/Allergies: Bruises/bleeds easily.  Psychiatric/Behavioral: Negative for depression. The patient is not nervous/anxious and does not have insomnia.       Observations/Objective: Physical Exam Neurological:     Mental Status: She is alert and oriented to person, place, and time.  Psychiatric:        Mood and Affect: Mood and affect normal.        Cognition and Memory: Memory normal.        Judgment: Judgment normal.    Patient reports morning stiffness for 0 minutes.   Patient denies nocturnal pain.  Difficulty dressing/grooming: Denies Difficulty climbing stairs: Denies Difficulty getting out of chair: Denies Difficulty using hands for taps, buttons, cutlery, and/or writing: Denies    Assessment and Plan: Visit Diagnoses:Positive ANA (antinuclear antibody)- 07/07/19: AVISE negative -2.0 ANA 1:160H, +RF IgM 9.2.   03/14/20: AVISE index -2.0.  ANA negative.  RF IgM 13.0.  She has no clinical features of systemic lupus or rheumatoid arthritis. Discussed AVISE results from 03/14/20 with the patient and all questions were addressed. She is not experiencing any joint pain, stiffness, or inflammation.  She has not noticed any rashes, photosensitivity, raynaud's, oral or nasal ulcers, sicca symptoms, chest pain, SOB, or palpitations.  She does not require immunosuppressive therapy at this time.  She was advised to notify us if she develops any new or  worsening symptoms.  She will follow up in the office in 1 year.   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   MGUS (monoclonal gammopathy of unknown significance): She underwent a thorough workup with hem/onc and was diagnosed with MGUS.   She will be having routine lab work every 6 months.  Numbness and tingling of upper and [name] extremities of both sides: Resolved.  She was evaluated by Dr. Krista Blue.  She underwent NCV with EMG which was normal on 09/12/19.   Primary osteoarthritis of right knee-She is not experiencing any discomfort or inflammation in the right knee joint at this time. She has no difficulty climbing steps or rising from a seated position.    DDD (degenerative disc disease), cervical-She is not experiencing any C-spine discomfort, stiffness, or crepitus.  No symptoms of radiculopathy.   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    I discussed the assessment and treatment plan with the patient. The patient was provided an opportunity to ask questions and all were answered. The patient agreed with the plan and demonstrated an understanding of the instructions.   The patient was advised to call back or seek an in-person evaluation if the symptoms worsen or if the condition fails to improve as anticipated.  I provided 25 minutes of non-face-to-face time during this encounter.  Scribed byHazel Sams, PA-C  I reviewed the above document and I attest to the accuracy of the note.   Bo Merino, MD

## 2020-04-09 ENCOUNTER — Telehealth (INDEPENDENT_AMBULATORY_CARE_PROVIDER_SITE_OTHER): Payer: BC Managed Care – PPO | Admitting: Rheumatology

## 2020-04-09 ENCOUNTER — Other Ambulatory Visit: Payer: Self-pay

## 2020-04-09 ENCOUNTER — Encounter: Payer: Self-pay | Admitting: Rheumatology

## 2020-04-09 DIAGNOSIS — D472 Monoclonal gammopathy: Secondary | ICD-10-CM

## 2020-04-09 DIAGNOSIS — I70229 Atherosclerosis of native arteries of extremities with rest pain, unspecified extremity: Secondary | ICD-10-CM

## 2020-04-09 DIAGNOSIS — T81718A Complication of other artery following a procedure, not elsewhere classified, initial encounter: Secondary | ICD-10-CM

## 2020-04-09 DIAGNOSIS — M5030 Other cervical disc degeneration, unspecified cervical region: Secondary | ICD-10-CM

## 2020-04-09 DIAGNOSIS — Z9889 Other specified postprocedural states: Secondary | ICD-10-CM

## 2020-04-09 DIAGNOSIS — R768 Other specified abnormal immunological findings in serum: Secondary | ICD-10-CM | POA: Diagnosis not present

## 2020-04-09 DIAGNOSIS — I729 Aneurysm of unspecified site: Secondary | ICD-10-CM

## 2020-04-09 DIAGNOSIS — R700 Elevated erythrocyte sedimentation rate: Secondary | ICD-10-CM | POA: Diagnosis not present

## 2020-04-09 DIAGNOSIS — R7303 Prediabetes: Secondary | ICD-10-CM

## 2020-04-09 DIAGNOSIS — I10 Essential (primary) hypertension: Secondary | ICD-10-CM

## 2020-04-09 DIAGNOSIS — I739 Peripheral vascular disease, unspecified: Secondary | ICD-10-CM

## 2020-04-09 DIAGNOSIS — M1711 Unilateral primary osteoarthritis, right knee: Secondary | ICD-10-CM | POA: Diagnosis not present

## 2020-04-09 DIAGNOSIS — E559 Vitamin D deficiency, unspecified: Secondary | ICD-10-CM

## 2020-04-11 DIAGNOSIS — Z0184 Encounter for antibody response examination: Secondary | ICD-10-CM | POA: Diagnosis not present

## 2020-04-11 DIAGNOSIS — F419 Anxiety disorder, unspecified: Secondary | ICD-10-CM | POA: Diagnosis not present

## 2020-04-11 DIAGNOSIS — M542 Cervicalgia: Secondary | ICD-10-CM | POA: Diagnosis not present

## 2020-04-14 NOTE — Progress Notes (Deleted)
Cardiology Clinic Note   Patient Name: Christina [name] Date of Encounter: 04/14/2020  Primary Care Provider:  Pcp, No Primary Cardiologist:  Mertie Moores, MD  Patient Profile    Christina [name] 54 year old female presents today for follow-up evaluation of her chest pain, HTN, and hyperlipidemia.  Past Medical History    Past Medical History:  Diagnosis Date  . Acute blood loss as cause of postoperative anemia   . Anemia 01/22/2019  . Chest pain    a. prior coronary CT without CAD.  Marland Kitchen Elevated sed rate 01/22/2019  . Esophageal dysmotility   . Essential hypertension 07/08/2016  . GERD (gastroesophageal reflux disease)   . MGUS (monoclonal gammopathy of unknown significance)   . Numbness and tingling   . PAD (peripheral artery disease) ([place])    a. 05/2018: PV angio subacute thrombotic occlusion of her popliteal artery. She underwent successful penumbra aspiration thrombectomy, PTA and self-expanding stenting using overlapping [place] self-expanding stents of a long thrombotic occlusion of the popliteal, anterior tibial and tibioperoneal trunk. Procedure c/b pseudoaneurysm/ABL anemia.  . 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[place])   . Uterine fibroid   . Vitamin D deficiency    Past Surgical History:  Procedure Laterality Date  . ABDOMINAL AORTOGRAM W/LOWER EXTREMITY Right 05/08/2018   Procedure: ABDOMINAL AORTOGRAM W/LOWER EXTREMITY;  Surgeon: Lorretta Harp, MD;  Location: [facility];  Service: Cardiovascular;  Laterality: Right;  . BREAST BIOPSY    . BREAST LUMPECTOMY    . LOWER EXTREMITY ANGIOGRAPHY  07/12/2019   Procedure: Lower Extremity Angiography;  Surgeon: Lorretta Harp, MD;  Location: [facility];  Service: Cardiovascular;;  . lump removed from breast at age 49    . PERIPHERAL VASCULAR BALLOON ANGIOPLASTY  07/12/2019   Procedure: PERIPHERAL VASCULAR BALLOON ANGIOPLASTY;  Surgeon: Lorretta Harp, MD;  Location: [facility];  Service:  Cardiovascular;;  . PERIPHERAL VASCULAR INTERVENTION Right 05/08/2018   Procedure: PERIPHERAL VASCULAR INTERVENTION;  Surgeon: Lorretta Harp, MD;  Location: [facility];  Service: Cardiovascular;  Laterality: Right;  Anterior tibial and popliteal stents  . PERIPHERAL VASCULAR THROMBECTOMY Right 05/08/2018   Procedure: PERIPHERAL VASCULAR THROMBECTOMY;  Surgeon: Lorretta Harp, MD;  Location: [facility];  Service: Cardiovascular;  Laterality: Right;  Popliteal, tibioperoneal trunk, Anterior tibial, Posterior tibial    Allergies  Allergies  Allergen Reactions  . Lisinopril Cough    History of Present Illness    Ms. Recendiz has a PMH of essential hypertension, PAD, critical limb ischemia with history of revascularization of same extremity, pseudoaneurysm, osteoarthritis of shoulder, uterine fibroid, vitamin D deficiency, atypical chest pain, prediabetes, obesity, anemia, hyperlipidemia, and claudication and peripheral vascular disease.  She was last seen by Dr. Gwenlyn Found on 09/07/2019. During that time she complained of areas of bruising on her body with small subcentimeter nodule on her left anterior thigh. The balance between bleeding and thrombosis was discussed. She has right popliteal artery and tibioperoneal trunk stenting and has hyper coagulable state. On Eliquis. She complainedof chronic chest pain going into her neck and jaw. She underwent Myoview 12/19 which was negative. She had coronary CTA 11/18 which showed a coronary calcium score of 0 and no evidence of coronary artery disease. It was felt that she may have coronary vasospasm or microvascular angina versus noncardiac cause of chest pain. She was started on amlodipine 2.5 mg.  She presented to the clinic 10/05/2019 for follow-up evaluation and statedshe had daily episodes of chest pressure that  will radiate up the middle of her chest to her jaw. This discomfort came on at rest. On occasion it  Would wake her up  from sleep at night. She never had exertional chest discomfort. Her LDL from 09/26/2019 is 59. She stated her last episode of chest pressure and radiation was that morning. When asked what she had for breakfast she stated she had boiled shrimp. I  gave her the instructions for GERD diet and lifestyle. I will have her start Protonix. She stated she had no relief from amlodipine medication therefore, I  stopped amlodipine. I scheduled follow-up for 1 month.  She presented to the clinic 11/06/2019 for follow-up evaluation and stated that she did not take any of the Protonix medication that was prescribed.  She stated that she was eating more spicy foods and did not follow the GERD diet instructions that were given to her.  She had not had any further reflux type attacks in the last month.  She did indicate that she had increased stress in her life since 2016.  When asked about the stress she stated that it had been ongoing with a family member.  We discussed the benefits of lowering stress and how it might be contributing to her chest pressure/painful episodes.  I have encouraged her to talk with a stress counselor through her place of employment, giave her the mindfulness stress reduction sheet, and also encouraged her to increase her physical activity.  I planned to have her follow-up with Dr. Acie Fredrickson in 6 months.  She presents to the clinic today for follow-up evaluation states***  Today shedenies chest pain, shortness of breath, lower extremity edema, fatigue, palpitations, melena, hematuria, hemoptysis, diaphoresis, weakness, presyncope, syncope, orthopnea, and PND.  Home Medications    Prior to Admission medications   Medication Sig Start Date End Date Taking? Authorizing Provider  atorvastatin (LIPITOR) 80 MG tablet Take 1 tablet by mouth once daily 03/21/20   Libby Maw, MD  carvedilol (COREG) 6.25 MG tablet Take 1 tablet (6.25 mg total) by mouth 2 (two) times daily. 03/31/20    Nahser, Wonda Cheng, MD  Cholecalciferol (VITAMIN D) 50 MCG (2000 UT) tablet Take 2,000 Units by mouth daily in the afternoon.    [provider]  clopidogrel (PLAVIX) 75 MG tablet Take 1 tablet (75 mg total) by mouth daily. 06/04/19   Lorretta Harp, MD  ELIQUIS 5 MG TABS tablet Take 1 tablet by mouth twice daily 12/03/19   Nahser, Wonda Cheng, MD  hydrochlorothiazide (MICROZIDE) 12.5 MG capsule Take 1 capsule (12.5 mg total) by mouth daily. 01/18/20   Nahser, Wonda Cheng, MD  megestrol (MEGACE) 40 MG tablet Take 1 tablet (40 mg total) by mouth daily in the afternoon. 02/27/20   Lavonia Drafts, MD  metFORMIN (GLUCOPHAGE-XR) 500 MG 24 hr tablet Take 1 tablet by mouth daily. 04/05/20   [provider]  potassium chloride (KLOR-CON) 10 MEQ tablet TAKE 1 TABLET BY MOUTH EVERY MONDAY, WEDNESDAY  AND FRIDAY IN THE AFTERNOON 03/15/20   Dutch Quint B, FNP  valsartan (DIOVAN) 160 MG tablet Take 1 tablet by mouth once daily 09/03/19   Lorretta Harp, MD  vitamin B-12 (CYANOCOBALAMIN) 1000 MCG tablet Take 1,000 mcg by mouth daily in the afternoon.     [provider]    Family History    Family History  Problem Relation Age of Onset  . Hypertension Mother   . Alzheimer's disease Father   . Healthy Son   .  Colon polyps Neg Hx   . Crohn's disease Neg Hx   . Rectal cancer Neg Hx   . Stomach cancer Neg Hx   . Pancreatic cancer Neg Hx    She indicated that her mother is alive. She indicated that her father is deceased. She indicated that her maternal grandmother is deceased. She indicated that her maternal grandfather is deceased. She indicated that her paternal grandmother is deceased. She indicated that her paternal grandfather is deceased. She indicated that her son is alive. She indicated that the status of her neg hx is unknown.  Social History    Social History   Socioeconomic History  . Marital status: Single    Spouse name: Not on file  . Number of children: 1   . Years of education: two years of college  . Highest education level: Not on file  Occupational History  . Not on file  Tobacco Use  . Smoking status: Never Smoker  . Smokeless tobacco: Never Used  Vaping Use  . Vaping Use: Never used  Substance and Sexual Activity  . Alcohol use: No  . Drug use: No  . Sexual activity: Yes  Other Topics Concern  . Not on file  Social History Narrative   Lives alone.   Right-handed.   No daily use of caffeine.   Social Determinants of Health   Financial Resource Strain: Not on file  Food Insecurity: Not on file  Transportation Needs: Not on file  Physical Activity: Not on file  Stress: Not on file  Social Connections: Not on file  Intimate Partner Violence: Not on file     Review of Systems    General:  No chills, fever, night sweats or weight changes.  Cardiovascular:  No chest pain, dyspnea on exertion, edema, orthopnea, palpitations, paroxysmal nocturnal dyspnea. Dermatological: No rash, lesions/masses Respiratory: No cough, dyspnea Urologic: No hematuria, dysuria Abdominal:   No nausea, vomiting, diarrhea, bright red blood per rectum, melena, or hematemesis Neurologic:  No visual changes, wkns, changes in mental status. All other systems reviewed and are otherwise negative except as noted above.  Physical Exam    VS:  There were no vitals taken for this visit. , BMI There is no height or weight on file to calculate BMI. GEN: Well nourished, well developed, in no acute distress. HEENT: normal. Neck: Supple, no JVD, carotid bruits, or masses. Cardiac: RRR, no murmurs, rubs, or gallops. No clubbing, cyanosis, edema.  Radials/DP/PT 2+ and equal bilaterally.  Respiratory:  Respirations regular and unlabored, clear to auscultation bilaterally. GI: Soft, nontender, nondistended, BS + x 4. MS: no deformity or atrophy. Skin: warm and dry, no rash. Neuro:  Strength and sensation are intact. Psych: Normal affect.  Accessory Clinical  Findings    Recent Labs: 07/13/2019: BUN 13; Creatinine, Ser 0.89; Hemoglobin 11.3; Platelets 225; Potassium 3.9; Sodium 138 08/20/2019: TSH 0.540   Recent Lipid Panel    Component Value Date/Time   CHOL 119 09/26/2019 0757   CHOL 119 09/05/2018 0809   TRIG 76.0 09/26/2019 0757   HDL 45.10 09/26/2019 0757   HDL 43 09/05/2018 0809   CHOLHDL 3 09/26/2019 0757   VLDL 15.2 09/26/2019 0757   LDLCALC 59 09/26/2019 0757   LDLCALC 55 09/05/2018 0809   LDLCALC 99 05/02/2017 1021    ECG personally reviewed by me today- *** - No acute changes  EKG 10/05/2019 sinus rhythm with occasional preventricular complexes moderate voltage criteria for LVH 89 bpm.  EKG 04/10/2019 Normal sinus rhythm  83 bpm moderate voltage criteria for LVH  Echocardiogram 11/21/2018 IMPRESSIONS    1. The left ventricle has normal systolic function with an ejection  fraction of 60-65%. The cavity size was normal. Left ventricular diastolic  Doppler parameters are consistent with impaired relaxation.  2. The right ventricle has normal systolic function. The cavity was  normal. There is no increase in right ventricular wall thickness.  3. Mild thickening of the mitral valve leaflet.  4. The aortic root is normal in size and structure.  5. The average left ventricular global longitudinal strain is -17.0 %.  Coronary CTA 03/04/2017 FINDINGS: Cardiovascular: Top-normal heart size. No significant pericardial fluid/thickening. Normal course and caliber of the visualized thoracic aorta. Dilated main pulmonary artery (3.6 cm diameter). No central pulmonary emboli.  Mediastinum/Nodes: Unremarkable esophagus. No pathologically enlarged axillary, mediastinal or hilar lymph nodes.  Lungs/Pleura: No pneumothorax. No pleural effusion. No acute consolidative airspace disease, lung masses or significant pulmonary nodules.  Upper abdomen: Unremarkable.  Musculoskeletal: No aggressive appearing focal osseous  lesions. Mild thoracic spondylosis.  IMPRESSION: 1. Dilated main pulmonary artery, suggesting pulmonary arterial hypertension. 2. Otherwise no significant extracardiac findings in the visualized chest.  IMPRESSION: 1. Coronary calcium score of 0. This was 0 percentile for age and sex matched control.  2. Normal coronary origin with right dominance.  3. No evidence of CAD.  4. Pulmonary artery is significantly dilated measuring 40 mm suggestive of pulmonary hypertension.  Assessment & Plan   1.  Chest pain-resolved.  ***  Last episode of chest pressure morning of 10/05/2019. Chronic in nature. Myoview 12/19 which was negative. She had coronary CTA 11/18 which showed a coronary calcium score of 0 and no evidence of coronary artery disease. It was felt that she may have coronary vasospasm or microvascular angina versus noncardiac cause of chest pain. Did not receive any relief with addition of amlodipine. ***Appeared to be acid reflux in nature however, she did not take any of the Protonix.  She did not change her diet and stated that she increased spicy foods.  This appears to be related to family stress that she has been dealing with since 2016. Continue sessions left stress counselor Continue mindfulness stress reduction  Continue carvedilol, atorvastatin, clopidogrel, valsartan Increase physical activity as tolerated Heart healthy low-sodium diet  Essential hypertension-BP today***112/64. Well-controlled at home. Continuecarvedilol, amlodipine, valsartan Heart healthy low-sodium diet-salty 6 given Increase physical activity as tolerated  Hyperlipidemia-LDL 59 on 09/26/2019 Continue atorvastatin Heart healthy low-sodium high-fiber diet Increase physical activity as tolerated  Disposition: Follow-up with Dr. Acie Fredrickson or me in 12 months.   Jossie Ng. Tymar Polyak NP-C    04/14/2020, 11:33 AM [place] [address] Office  [phone] Fax [phone]  Notice: This dictation was prepared with Dragon dictation along with smaller phrase technology. Any transcriptional errors that result from this process are unintentional and may not be corrected upon review.

## 2020-04-15 ENCOUNTER — Ambulatory Visit: Payer: BC Managed Care – PPO | Admitting: General Practice

## 2020-04-15 NOTE — Progress Notes (Signed)
Cardiology Clinic Note   Patient Name: Christina [name] Date of Encounter: 04/18/2020  Primary Care Provider:  Pcp, No Primary Cardiologist:  Mertie Moores, MD  Patient Profile    Christina [name] 54 year old female presents today for follow-up evaluation of her chest pain, HTN, and hyperlipidemia.   Past Medical History    Past Medical History:  Diagnosis Date  . Acute blood loss as cause of postoperative anemia   . Anemia 01/22/2019  . Chest pain    a. prior coronary CT without CAD.  Marland Kitchen Elevated sed rate 01/22/2019  . Esophageal dysmotility   . Essential hypertension 07/08/2016  . GERD (gastroesophageal reflux disease)   . MGUS (monoclonal gammopathy of unknown significance)   . Numbness and tingling   . PAD (peripheral artery disease) ([place])    a. 05/2018: PV angio subacute thrombotic occlusion of her popliteal artery. She underwent successful penumbra aspiration thrombectomy, PTA and self-expanding stenting using overlapping [place] self-expanding stents of a long thrombotic occlusion of the popliteal, anterior tibial and tibioperoneal trunk. Procedure c/b pseudoaneurysm/ABL anemia.  . 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[place])   . Uterine fibroid   . Vitamin D deficiency    Past Surgical History:  Procedure Laterality Date  . ABDOMINAL AORTOGRAM W/LOWER EXTREMITY Right 05/08/2018   Procedure: ABDOMINAL AORTOGRAM W/LOWER EXTREMITY;  Surgeon: Lorretta Harp, MD;  Location: [facility];  Service: Cardiovascular;  Laterality: Right;  . BREAST BIOPSY    . BREAST LUMPECTOMY    . LOWER EXTREMITY ANGIOGRAPHY  07/12/2019   Procedure: Lower Extremity Angiography;  Surgeon: Lorretta Harp, MD;  Location: [facility];  Service: Cardiovascular;;  . lump removed from breast at age 20    . PERIPHERAL VASCULAR BALLOON ANGIOPLASTY  07/12/2019   Procedure: PERIPHERAL VASCULAR BALLOON ANGIOPLASTY;  Surgeon: Lorretta Harp, MD;  Location: [facility];  Service:  Cardiovascular;;  . PERIPHERAL VASCULAR INTERVENTION Right 05/08/2018   Procedure: PERIPHERAL VASCULAR INTERVENTION;  Surgeon: Lorretta Harp, MD;  Location: [facility];  Service: Cardiovascular;  Laterality: Right;  Anterior tibial and popliteal stents  . PERIPHERAL VASCULAR THROMBECTOMY Right 05/08/2018   Procedure: PERIPHERAL VASCULAR THROMBECTOMY;  Surgeon: Lorretta Harp, MD;  Location: [facility];  Service: Cardiovascular;  Laterality: Right;  Popliteal, tibioperoneal trunk, Anterior tibial, Posterior tibial    Allergies  Allergies  Allergen Reactions  . Lisinopril Cough    History of Present Illness    [name] has a PMH of essential hypertension, PAD, critical limb ischemia with history of revascularization of same extremity, pseudoaneurysm, osteoarthritis of shoulder, uterine fibroid, vitamin D deficiency, atypical chest pain, prediabetes, obesity, anemia, hyperlipidemia, and claudication and peripheral vascular disease.  She was last seen by Dr. Gwenlyn Found on 09/07/2019. During that time she complained of areas of bruising on her body with small subcentimeter nodule on her left anterior thigh. The balance between bleeding and thrombosis was discussed. She has right popliteal artery and tibioperoneal trunk stenting and has hyper coagulable state. On Eliquis. She complainedof chronic chest pain going into her neck and jaw. She underwent Myoview 12/19 which was negative. She had coronary CTA 11/18 which showed a coronary calcium score of 0 and no evidence of coronary artery disease. It was felt that she may have coronary vasospasm or microvascular angina versus noncardiac cause of chest pain. She was started on amlodipine 2.5 mg.  She presented to the clinic 10/05/2019 for follow-up evaluation and statedshe had daily episodes of chest pressure  that will radiate up the middle of her chest to her jaw. This discomfort came on at rest. On occasion it  Would wake her up  from sleep at night. She never had exertional chest discomfort. Her LDL from 09/26/2019 is 59. She stated her last episode of chest pressure and radiation was that morning. When asked what she had for breakfast she stated she had boiled shrimp. I  gave her the instructions for GERD diet and lifestyle. I will have her start Protonix. She stated she had no relief from amlodipine medication therefore, I  stopped amlodipine. I scheduled follow-up for 1 month.  She presented to the clinic 11/06/2019 for follow-up evaluation and stated that she did not take any of the Protonix medication that was prescribed.  She stated that she was eating more spicy foods and did not follow the GERD diet instructions that were given to her.  She had not had any further reflux type attacks in the last month.  She did indicate that she had increased stress in her life since 2016.  When asked about the stress she stated that it had been ongoing with a family member.  We discussed the benefits of lowering stress and how it might be contributing to her chest pressure/painful episodes.  I have encouraged her to talk with a stress counselor through her place of employment, giave her the mindfulness stress reduction sheet, and also encouraged her to increase her physical activity.  I planned to have her follow-up with Dr. Acie Fredrickson in 6 months.  She presents to the clinic today for follow-up evaluation states she continues to have intermittent episodes of chest discomfort. Her chest discomfort she describes as pain that starts in her chest and radiates up to her left jaw. The pain will last for 15-20 minutes and dissipate on its own. She does not have any exertional chest pain. She continues to have stress with her job and family stress as well. She states she receives calls on a daily basis that contribute to her life stress. She is working on strategies to make stress lower. I have encouraged her to start a formal exercise routine and  she plans to order a treadmill so that she may walk daily. Her insurance will no longer cover  valsartan. We discussed changing her to irbesartan 150 mg daily. I will order a BMP in 1 week, order an echocardiogram, and have her follow-up in 6 months.  Today shedenies chest pain, shortness of breath, lower extremity edema, fatigue, palpitations, melena, hematuria, hemoptysis, diaphoresis, weakness, presyncope, syncope, orthopnea, and PND.   Home Medications    Prior to Admission medications   Medication Sig Start Date End Date Taking? Authorizing Provider  atorvastatin (LIPITOR) 80 MG tablet Take 1 tablet by mouth once daily 03/21/20   Libby Maw, MD  carvedilol (COREG) 6.25 MG tablet Take 1 tablet (6.25 mg total) by mouth 2 (two) times daily. 03/31/20   Nahser, Wonda Cheng, MD  Cholecalciferol (VITAMIN D) 50 MCG (2000 UT) tablet Take 2,000 Units by mouth daily in the afternoon.    [provider]  clopidogrel (PLAVIX) 75 MG tablet Take 1 tablet (75 mg total) by mouth daily. 06/04/19   Lorretta Harp, MD  ELIQUIS 5 MG TABS tablet Take 1 tablet by mouth twice daily 12/03/19   Nahser, Wonda Cheng, MD  hydrochlorothiazide (MICROZIDE) 12.5 MG capsule Take 1 capsule (12.5 mg total) by mouth daily. 01/18/20   Nahser, Wonda Cheng, MD  megestrol (MEGACE) 40  MG tablet Take 1 tablet (40 mg total) by mouth daily in the afternoon. 02/27/20   Lavonia Drafts, MD  metFORMIN (GLUCOPHAGE-XR) 500 MG 24 hr tablet Take 1 tablet by mouth daily. 04/05/20   [provider]  potassium chloride (KLOR-CON) 10 MEQ tablet TAKE 1 TABLET BY MOUTH EVERY MONDAY, WEDNESDAY  AND FRIDAY IN THE AFTERNOON 03/15/20   Dutch Quint B, FNP  valsartan (DIOVAN) 160 MG tablet Take 1 tablet by mouth once daily 09/03/19   Lorretta Harp, MD  vitamin B-12 (CYANOCOBALAMIN) 1000 MCG tablet Take 1,000 mcg by mouth daily in the afternoon.     [provider]    Family History    Family History  Problem  Relation Age of Onset  . Hypertension Mother   . Alzheimer's disease Father   . Healthy Son   . Colon polyps Neg Hx   . Crohn's disease Neg Hx   . Rectal cancer Neg Hx   . Stomach cancer Neg Hx   . Pancreatic cancer Neg Hx    She indicated that her mother is alive. She indicated that her father is deceased. She indicated that her maternal grandmother is deceased. She indicated that her maternal grandfather is deceased. She indicated that her paternal grandmother is deceased. She indicated that her paternal grandfather is deceased. She indicated that her son is alive. She indicated that the status of her neg hx is unknown.  Social History    Social History   Socioeconomic History  . Marital status: Single    Spouse name: Not on file  . Number of children: 1  . Years of education: two years of college  . Highest education level: Not on file  Occupational History  . Not on file  Tobacco Use  . Smoking status: Never Smoker  . Smokeless tobacco: Never Used  Vaping Use  . Vaping Use: Never used  Substance and Sexual Activity  . Alcohol use: No  . Drug use: No  . Sexual activity: Yes  Other Topics Concern  . Not on file  Social History Narrative   Lives alone.   Right-handed.   No daily use of caffeine.   Social Determinants of Health   Financial Resource Strain: Not on file  Food Insecurity: Not on file  Transportation Needs: Not on file  Physical Activity: Not on file  Stress: Not on file  Social Connections: Not on file  Intimate Partner Violence: Not on file     Review of Systems    General:  No chills, fever, night sweats or weight changes.  Cardiovascular:  No chest pain, dyspnea on exertion, edema, orthopnea, palpitations, paroxysmal nocturnal dyspnea. Dermatological: No rash, lesions/masses Respiratory: No cough, dyspnea Urologic: No hematuria, dysuria Abdominal:   No nausea, vomiting, diarrhea, bright red blood per rectum, melena, or  hematemesis Neurologic:  No visual changes, wkns, changes in mental status. All other systems reviewed and are otherwise negative except as noted above.  Physical Exam    VS:  BP 122/70 (BP Location: Left Arm, Patient Position: Sitting, Cuff Size: Large)   Pulse 78   Ht 5\' 5"  (1.651 m)   Wt 292 lb (132.5 kg)   SpO2 98%   BMI 48.59 kg/m  , BMI Body mass index is 48.59 kg/m. GEN: Well nourished, well developed, in no acute distress. HEENT: normal. Neck: Supple, no JVD, carotid bruits, or masses. Cardiac: RRR, no murmurs, rubs, or gallops. No clubbing, cyanosis, edema.  Radials/DP/PT 2+ and equal bilaterally.  Respiratory:  Respirations regular and unlabored, clear to auscultation bilaterally. GI: Soft, nontender, nondistended, BS + x 4. MS: no deformity or atrophy. Skin: warm and dry, no rash. Neuro:  Strength and sensation are intact. Psych: Normal affect.  Accessory Clinical Findings    Recent Labs: 07/13/2019: BUN 13; Creatinine, Ser 0.89; Hemoglobin 11.3; Platelets 225; Potassium 3.9; Sodium 138 08/20/2019: TSH 0.540   Recent Lipid Panel    Component Value Date/Time   CHOL 119 09/26/2019 0757   CHOL 119 09/05/2018 0809   TRIG 76.0 09/26/2019 0757   HDL 45.10 09/26/2019 0757   HDL 43 09/05/2018 0809   CHOLHDL 3 09/26/2019 0757   VLDL 15.2 09/26/2019 0757   LDLCALC 59 09/26/2019 0757   LDLCALC 55 09/05/2018 0809   LDLCALC 99 05/02/2017 1021    ECG personally reviewed by me today-none today.  EKG 10/05/2019 sinus rhythm with occasional preventricular complexes moderate voltage criteria for LVH 89 bpm.  EKG 04/10/2019 Normal sinus rhythm 83 bpm moderate voltage criteria for LVH  Echocardiogram 11/21/2018 IMPRESSIONS    1. The left ventricle has normal systolic function with an ejection  fraction of 60-65%. The cavity size was normal. Left ventricular diastolic  Doppler parameters are consistent with impaired relaxation.  2. The right ventricle has normal  systolic function. The cavity was  normal. There is no increase in right ventricular wall thickness.  3. Mild thickening of the mitral valve leaflet.  4. The aortic root is normal in size and structure.  5. The average left ventricular global longitudinal strain is -17.0 %.  Coronary CTA 03/04/2017 FINDINGS: Cardiovascular: Top-normal heart size. No significant pericardial fluid/thickening. Normal course and caliber of the visualized thoracic aorta. Dilated main pulmonary artery (3.6 cm diameter). No central pulmonary emboli.  Mediastinum/Nodes: Unremarkable esophagus. No pathologically enlarged axillary, mediastinal or hilar lymph nodes.  Lungs/Pleura: No pneumothorax. No pleural effusion. No acute consolidative airspace disease, lung masses or significant pulmonary nodules.  Upper abdomen: Unremarkable.  Musculoskeletal: No aggressive appearing focal osseous lesions. Mild thoracic spondylosis.  IMPRESSION: 1. Dilated main pulmonary artery, suggesting pulmonary arterial hypertension. 2. Otherwise no significant extracardiac findings in the visualized chest.  IMPRESSION: 1. Coronary calcium score of 0. This was 0 percentile for age and sex matched control.  2. Normal coronary origin with right dominance.  3. No evidence of CAD.  4. Pulmonary artery is significantly dilated measuring 40 mm suggestive of pulmonary hypertension.  Assessment & Plan   1.  Chest pain-intermittent 10-15 mintues. She describes the pain as throat and jaw pain. She also has a ultrasound scheduled through her PCP.Chronic in nature. Myoview 12/19 which was negative. She had coronary CTA 11/18 which showed a coronary calcium score of 0 and no evidence of coronary artery disease. It was felt that she may have coronary vasospasm or microvascular angina versus noncardiac cause of chest pain. Did not receive any relief with addition of amlodipine.  Continue mindfulness stress reduction   Continue carvedilol, atorvastatin, clopidogrel,  Stop valsartan Start irbesartan Increase physical activity as tolerated Heart healthy low-sodium diet Order echocardiogram Repeat BMP 1 week after initiation of irbesartan  Essential hypertension-BP today122/70well-controlled at home. Continuecarvedilol, amlodipine, valsartan Heart healthy low-sodium diet-salty 6 given Increase physical activity as tolerated  Hyperlipidemia-LDL 59 on 09/26/2019 Continue atorvastatin Heart healthy low-sodium high-fiber diet Increase physical activity as tolerated  Disposition: Follow-up with Dr. Acie Fredrickson or me in 6 months.    Jossie Ng. Chadric Kimberley NP-C    04/18/2020, 9:13 AM [place]  [facility] [phone] Fax [phone]  Notice: This dictation was prepared with Dragon dictation along with smaller phrase technology. Any transcriptional errors that result from this process are unintentional and may not be corrected upon review.

## 2020-04-18 ENCOUNTER — Encounter: Payer: Self-pay | Admitting: General Practice

## 2020-04-18 ENCOUNTER — Other Ambulatory Visit: Payer: Self-pay

## 2020-04-18 ENCOUNTER — Ambulatory Visit (INDEPENDENT_AMBULATORY_CARE_PROVIDER_SITE_OTHER): Payer: BC Managed Care – PPO | Admitting: General Practice

## 2020-04-18 ENCOUNTER — Ambulatory Visit (HOSPITAL_COMMUNITY): Payer: BC Managed Care – PPO | Attending: Cardiovascular Disease

## 2020-04-18 VITALS — BP 122/70 | HR 78 | Ht 65.0 in | Wt 292.0 lb

## 2020-04-18 DIAGNOSIS — Z79899 Other long term (current) drug therapy: Secondary | ICD-10-CM | POA: Diagnosis not present

## 2020-04-18 DIAGNOSIS — I10 Essential (primary) hypertension: Secondary | ICD-10-CM | POA: Diagnosis not present

## 2020-04-18 DIAGNOSIS — E782 Mixed hyperlipidemia: Secondary | ICD-10-CM | POA: Diagnosis not present

## 2020-04-18 DIAGNOSIS — R079 Chest pain, unspecified: Secondary | ICD-10-CM | POA: Diagnosis not present

## 2020-04-18 LAB — ECHOCARDIOGRAM COMPLETE
Area-P 1/2: 3.03 cm2
Height: 65 in
S' Lateral: 2.6 cm
Weight: 4672 oz

## 2020-04-18 MED ORDER — IRBESARTAN 150 MG PO TABS: 150.0000 mg | ORAL_TABLET | Freq: Every day | ORAL | 6 refills | Status: DC

## 2020-04-18 NOTE — Patient Instructions (Signed)
Medication Instructions:  STOP VALSARTAN  START IRBESARTAN 150MG  DAILY  *If you need a refill on your cardiac medications before your next appointment, please call your pharmacy*  Lab Work: BMET IN ONE WEEK-THIS IS NOT FASTING If you have labs (blood work) drawn today and your tests are completely normal, you will receive your results only by:  [place] (if you have MyChart) OR A paper copy in the mail.  If you have any lab test that is abnormal or we need to change your treatment, we will call you to review the results. You may go to any [facility] that is convenient for you however, we do have a lab in our office that is able to assist you. You DO NOT need an appointment for our lab. The lab is open 8:00am and closes at 4:00pm. Lunch 12:45 - 1:45pm.  Testing/Procedures: Echocardiogram - Your physician has requested that you have an echocardiogram. Echocardiography is a painless test that uses sound waves to create images of your heart. It provides your doctor with information about the size and shape of your heart and how well your heart's chambers and valves are working. This procedure takes approximately one hour. There are no restrictions for this procedure. This will be performed at our [facility] location - [address].  Special Instructions PLEASE READ AND FOLLOW SALTY 6-ATTACHED-1,800 mg daily  PLEASE INCREASE PHYSICAL ACTIVITY AS TOLERATED  Follow-Up: Your next appointment:  6 month(s) In Person with Quay Burow, MD OR IF UNAVAILABLE [place], FNP-C   At [facility], you and your health needs are our priority.  As part of our continuing mission to provide you with exceptional heart care, we have created designated Provider Care Teams.  These Care Teams include your primary Cardiologist (physician) and Advanced Practice Providers (APPs -  Physician Assistants and Nurse Practitioners) who all work together to provide you with the care you need, when you  need it.

## 2020-04-21 ENCOUNTER — Other Ambulatory Visit: Payer: BC Managed Care – PPO | Admitting: Obstetrics & Gynecology

## 2020-04-21 ENCOUNTER — Encounter: Payer: Self-pay | Admitting: Rheumatology

## 2020-04-21 DIAGNOSIS — R6884 Jaw pain: Secondary | ICD-10-CM | POA: Diagnosis not present

## 2020-04-21 DIAGNOSIS — R590 Localized enlarged lymph nodes: Secondary | ICD-10-CM | POA: Diagnosis not present

## 2020-04-21 DIAGNOSIS — M542 Cervicalgia: Secondary | ICD-10-CM | POA: Diagnosis not present

## 2020-04-21 NOTE — Telephone Encounter (Addendum)
Pt had ECHO per Dr Gwenlyn Found: Lorretta Harp, MD 04/18/2020 4:49 PM EST  Essentially normal study. Repeat when clinically indicated.   Pt notified, she will practice these techniques and call PCP for further advice

## 2020-04-21 NOTE — Telephone Encounter (Signed)
I called patient and discussed lab results.

## 2020-04-28 ENCOUNTER — Ambulatory Visit: Payer: BC Managed Care – PPO | Admitting: Nurse Practitioner

## 2020-04-30 ENCOUNTER — Ambulatory Visit (INDEPENDENT_AMBULATORY_CARE_PROVIDER_SITE_OTHER): Payer: BC Managed Care – PPO | Admitting: Nurse Practitioner

## 2020-04-30 ENCOUNTER — Encounter: Payer: Self-pay | Admitting: Nurse Practitioner

## 2020-04-30 ENCOUNTER — Ambulatory Visit: Payer: BC Managed Care – PPO | Admitting: Nurse Practitioner

## 2020-04-30 VITALS — BP 146/86 | HR 96 | Ht 65.0 in | Wt 301.0 lb

## 2020-04-30 DIAGNOSIS — E049 Nontoxic goiter, unspecified: Secondary | ICD-10-CM | POA: Diagnosis not present

## 2020-04-30 DIAGNOSIS — I878 Other specified disorders of veins: Secondary | ICD-10-CM | POA: Diagnosis not present

## 2020-04-30 DIAGNOSIS — R591 Generalized enlarged lymph nodes: Secondary | ICD-10-CM | POA: Diagnosis not present

## 2020-04-30 DIAGNOSIS — R590 Localized enlarged lymph nodes: Secondary | ICD-10-CM | POA: Diagnosis not present

## 2020-04-30 DIAGNOSIS — Z1211 Encounter for screening for malignant neoplasm of colon: Secondary | ICD-10-CM | POA: Insufficient documentation

## 2020-04-30 DIAGNOSIS — K118 Other diseases of salivary glands: Secondary | ICD-10-CM | POA: Diagnosis not present

## 2020-04-30 DIAGNOSIS — M542 Cervicalgia: Secondary | ICD-10-CM | POA: Diagnosis not present

## 2020-04-30 NOTE — Patient Instructions (Signed)
If you are age 56 or older, your body mass index should be between 23-30. Your Body mass index is 50.48 kg/m. If this is out of the aforementioned range listed, please consider follow up with your Primary Care Provider.  If you are age 67 or younger, your body mass index should be between 19-25. Your Body mass index is 50.48 kg/m. If this is out of the aformentioned range listed, please consider follow up with your Primary Care Provider.    Please call to schedule an appointment with an RN in 2-3 weeks for a weight check.   It was great seeing you today!  Thank you for entrusting me with your care and choosing [facility].  Alcide Evener, NP

## 2020-04-30 NOTE — Progress Notes (Signed)
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 She presents today to schedule a colonoscopy.  A colonoscopy was previously deferred due to her vascular surgery which required Plavix and Eliquis without interruption for 6 months. She underwent a Cologuard test in 2019 which was negative.  She denies ever having a screening colonoscopy.  Mother with history of colon cancer.  She denies having any upper or lower abdominal pain.  She is passing normal formed brown bowel movement daily.  No dysphagia or GERD symptoms.  She has chronic chest versus esophageal pain which has extensively been evaluated by cardiology in the past doubt evidence of coronary artery disease.  She typically has chest pain if she is under a moderate amount of stress.  She last had an episode of her chronic chest pain 2 weeks ago which lasted for less than 10 minutes and went away after she drank hot water.  No associated palpitations, dizziness or shortness of breath.  She underwent an EGD 12/27/2017 which showed a 1 cm hiatal hernia, biopsies of the esophagus and stomach were normal.  She reported feeling drowsy for extended period of time following her EGD.  MAC was utilized during this procedure.  She reports gaining 20 pounds in the past year.  Her BMI today is 50.48.  No other complaints at this time.  Laboratory studies 04/11/2020: Sodium 142.  Potassium 3.4.  BUN 12.  Creatinine 0.85.  Albumin 4.0.  Total bili 0.5.  Alk phos 92.  AST 20.  ALT  16. Laboratory studies 04/04/2020: WBC 5.9.  Hemoglobin 12.9.  Hematocrit 38.5.  MCV 90.6.  Platelet 21.  EGD 12/27/2017 - 1cm HH, normal esophagus, patchy erythema of the stomach, otherwise normal - biopsies of esophagus normal as were gastric biopsies  CT chest 11/12/2017 -tiny pulmonary nodules, follow up in 12 months. Aortic atherosclerosis, 1cm left adrenal adenoma  Cardiac CT - 03/07/2017 - dilated pulmonary artery, otherwise no significant coronary calcium Echocardiogram 03/17/2017 - 55-60% Negative nuclear stress 04/20/18  Past Medical History:  Diagnosis Date  . Acute blood loss as cause of postoperative anemia   . Anemia 01/22/2019  . Chest pain    a. prior coronary CT without CAD.  Marland Kitchen Elevated sed rate 01/22/2019  . Esophageal dysmotility   . Essential hypertension 07/08/2016  . GERD (gastroesophageal reflux disease)   . MGUS (monoclonal gammopathy of unknown significance)   . Numbness and tingling   . PAD (peripheral artery disease) ([place])    a. 05/2018: PV angio subacute thrombotic occlusion of her popliteal artery. She underwent successful penumbra aspiration thrombectomy, PTA and self-expanding stenting using overlapping Tigris self-expanding stents of a long thrombotic occlusion of the popliteal, anterior tibial and tibioperoneal trunk. Procedure c/b pseudoaneurysm/ABL anemia.  . 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[place])   . Uterine fibroid   . Vitamin D deficiency    Past Surgical History:  Procedure Laterality Date  . ABDOMINAL AORTOGRAM W/LOWER EXTREMITY Right 05/08/2018   Procedure: ABDOMINAL AORTOGRAM W/LOWER EXTREMITY;  Surgeon: Lorretta Harp, MD;  Location: [place] [facility];  Service:  Cardiovascular;  Laterality: Right;  . BREAST BIOPSY    . BREAST LUMPECTOMY    . LOWER EXTREMITY ANGIOGRAPHY  07/12/2019   Procedure: Lower Extremity Angiography;  Surgeon: Lorretta Harp, MD;  Location: [place] [facility];  Service: Cardiovascular;;  . lump removed from breast  at age 66    . PERIPHERAL VASCULAR BALLOON ANGIOPLASTY  07/12/2019   Procedure: PERIPHERAL VASCULAR BALLOON ANGIOPLASTY;  Surgeon: Lorretta Harp, MD;  Location: [place] [facility];  Service: Cardiovascular;;  . PERIPHERAL VASCULAR INTERVENTION Right 05/08/2018   Procedure: PERIPHERAL VASCULAR INTERVENTION;  Surgeon: Lorretta Harp, MD;  Location: [place] [facility];  Service: Cardiovascular;  Laterality: Right;  Anterior tibial and popliteal stents  . PERIPHERAL VASCULAR THROMBECTOMY Right 05/08/2018   Procedure: PERIPHERAL VASCULAR THROMBECTOMY;  Surgeon: Lorretta Harp, MD;  Location: [place] [facility];  Service: Cardiovascular;  Laterality: Right;  Popliteal, tibioperoneal trunk, Anterior tibial, Posterior tibial      Current Outpatient Medications on File Prior to Visit  Medication Sig Dispense Refill  . atorvastatin (LIPITOR) 80 MG tablet Take 1 tablet by mouth once daily 90 tablet 0  . carvedilol (COREG) 6.25 MG tablet Take 1 tablet (6.25 mg total) by mouth 2 (two) times daily. 180 tablet 2  . Cholecalciferol (VITAMIN D) 50 MCG (2000 UT) tablet Take 2,000 Units by mouth daily in the afternoon.    . clopidogrel (PLAVIX) 75 MG tablet Take 1 tablet (75 mg total) by mouth daily. 90 tablet 3  . ELIQUIS 5 MG TABS tablet Take 1 tablet by mouth twice daily 60 tablet 5  . hydrochlorothiazide (MICROZIDE) 12.5 MG capsule Take 12.5 mg by mouth daily.    . irbesartan (AVAPRO) 150 MG tablet Take 1 tablet (150 mg total) by mouth daily. 30 tablet 6  . megestrol (MEGACE) 40 MG tablet Take 1 tablet (40 mg total) by mouth daily in the afternoon. 90 tablet 3  . metFORMIN (GLUCOPHAGE-XR) 500 MG 24 hr tablet Take 1 tablet by mouth daily.    . vitamin B-12 (CYANOCOBALAMIN) 1000 MCG tablet Take 1,000 mcg by mouth daily in the afternoon.      No current facility-administered medications on file prior to visit.   Allergies  Allergen Reactions  . Lisinopril Cough    Current Medications, Allergies,  Past Medical History, Past Surgical History, Family History and Social History were reviewed in [employer] record.   Review of Systems:   Constitutional: Negative for fever, sweats, chills or weight loss.  Respiratory: Negative for shortness of breath.   Cardiovascular: See HPI. + ankle edema.   Gastrointestinal: See HPI.  Musculoskeletal: Negative for back pain or muscle aches.  Neurological: Negative for dizziness, headaches or paresthesias.   Physical Exam: BP (!) 146/86 (BP Location: Left Wrist, Patient Position: Sitting, Cuff Size: Normal)   Pulse 96   Ht _0  (1.651 m) Comment: height measured without shoes  Wt (!) 301 lb (136.5 kg)   BMI 50.09 kg/m Initial BMI was documented at 50.49%. Patient height and weight was repeated and BMI then changed to 50.09.   General: Obese female in no acute distress. Head: Normocephalic and atraumatic. Eyes: No scleral icterus. Conjunctiva pink . Ears: Normal auditory acuity. Mouth: Upper and lower dentures.  No ulcers or lesions.  Lungs: Clear throughout to auscultation. Heart: Regular rate and rhythm, no murmur. Abdomen: Soft, nontender and nondistended. No masses or hepatomegaly. Normal bowel sounds x 4 quadrants.  Rectal: Deferred.  Musculoskeletal: Symmetrical  with no gross deformities. Extremities: No edema. Neurological: Alert oriented x 4. No focal deficits.  Psychological: Alert and cooperative. Normal mood and affect  Assessment and Recommendations:  32. 54 year old female presents to schedule a screening colonoscopy. Family history (mother) of colon cancer.  -Colonoscopy benefits and risks discussed including risk with sedation, risk of bleeding, perforation and infection.  Initial BMI in office today was 50.49 therefore colonoscopy to be scheduled at [facility] with Dr. Havery Moros.  Patient intends to lose weight. She wishes to return to our office for a repeat weight check in 2 to 3 weeks, if  her BMI is less than 50 her procedure will be scheduled at the [facility] -Our office will contact Dr. Alvester Chou to verify Plavix and Eliquis instructions prior to proceeding with a colonoscopy -Further follow-up to be determined after colonoscopy completed  2. History of peripheral arterial disease s/p right thrombectomy with angioplasty and stent placement extending from the popliteal to anterior tibial artery 05/2018 and s/p right popliteal angioplasty for in-stent stenosis 07/12/2019 on Plavix and Eliquis  3. 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  Chronic chest pain, assess as non-cardiac

## 2020-04-30 NOTE — Progress Notes (Signed)
Agree with assessment and plan as outlined. In need of colonoscopy, unfortunately we have a long backlog of cases for the hospital right now. If she can lose a few pounds in the upcoming weeks and get BMI < 50 we can then do her procedure in the [facility]. I agree she should come back for a weight check in a few weeks so we can make a final decision about where colonoscopy will be done. Thanks

## 2020-05-20 ENCOUNTER — Ambulatory Visit: Payer: BC Managed Care – PPO | Admitting: Cardiovascular Disease

## 2020-06-30 ENCOUNTER — Other Ambulatory Visit: Payer: Self-pay | Admitting: Cardiovascular Disease

## 2020-06-30 MED ORDER — APIXABAN 5 MG PO TABS
5.0000 mg | ORAL_TABLET | Freq: Two times a day (BID) | ORAL | 1 refills | Status: DC
Start: 2020-06-30 — End: 2021-01-13

## 2020-07-23 ENCOUNTER — Telehealth: Payer: Self-pay | Admitting: Cardiovascular Disease

## 2020-07-23 NOTE — Telephone Encounter (Signed)
  Patient will be coming in for her annual appt with Dr Gwenlyn Found and she stated that she usually has an Echo done every year. I did not see orders for an echo. Can orders be put in if Dr Gwenlyn Found wants her to have this test?

## 2020-07-23 NOTE — Telephone Encounter (Signed)
Left detailed message (ok per DPR)-advised echo completed in December 2021 and no repeat recommended at this time.      Lorretta Harp, MD  04/18/2020 4:49 PM EST      Essentially normal study. Repeat when clinically indicated.      Advised to call back with any questions.

## 2020-07-30 ENCOUNTER — Other Ambulatory Visit (HOSPITAL_COMMUNITY): Payer: Self-pay | Admitting: Cardiovascular Disease

## 2020-07-30 DIAGNOSIS — Z9862 Peripheral vascular angioplasty status: Secondary | ICD-10-CM

## 2020-07-30 DIAGNOSIS — I739 Peripheral vascular disease, unspecified: Secondary | ICD-10-CM

## 2020-08-07 ENCOUNTER — Other Ambulatory Visit: Payer: Self-pay | Admitting: Cardiovascular Disease

## 2020-09-09 ENCOUNTER — Other Ambulatory Visit (HOSPITAL_BASED_OUTPATIENT_CLINIC_OR_DEPARTMENT_OTHER): Payer: Self-pay | Admitting: General Practice

## 2020-09-09 DIAGNOSIS — Z1231 Encounter for screening mammogram for malignant neoplasm of breast: Secondary | ICD-10-CM

## 2020-09-10 ENCOUNTER — Telehealth: Payer: Self-pay

## 2020-09-10 ENCOUNTER — Encounter: Payer: Self-pay | Admitting: Family Medicine

## 2020-09-10 ENCOUNTER — Ambulatory Visit (INDEPENDENT_AMBULATORY_CARE_PROVIDER_SITE_OTHER): Payer: BC Managed Care – PPO | Admitting: Family Medicine

## 2020-09-10 ENCOUNTER — Other Ambulatory Visit: Payer: Self-pay

## 2020-09-10 ENCOUNTER — Ambulatory Visit: Payer: Self-pay

## 2020-09-10 VITALS — BP 120/72 | Ht 65.0 in | Wt 295.0 lb

## 2020-09-10 DIAGNOSIS — M1712 Unilateral primary osteoarthritis, left knee: Secondary | ICD-10-CM

## 2020-09-10 DIAGNOSIS — M25562 Pain in left knee: Secondary | ICD-10-CM

## 2020-09-10 MED ORDER — PREDNISONE 5 MG PO TABS: ORAL_TABLET | ORAL | 0 refills | Status: DC

## 2020-09-10 NOTE — Telephone Encounter (Signed)
Called and spoke to patient.  She indicated that she is not ready to schedule a procedure. She said she "has a lot going on in her personal life". She said she will call us when she is ready to schedule.  I reminded her how important colon cancer screening is. She expressed understanding. She asked questions about the difference between having her procedure at the hospital vs. [facility].  All questions answered.

## 2020-09-10 NOTE — Progress Notes (Signed)
Christina [name] - 55 y.o. female MRN [id number]  Date of birth: [birth year]  SUBJECTIVE:  Including CC & ROS.  No chief complaint on file.   Christina [name] is a 55 y.o. female that is presenting with acute left knee pain.  The pain is similar to her previous pain.  Denies any new or different activities.  Is localized to the knee.   Review of Systems See HPI   HISTORY: Past Medical, Surgical, Social, and Family History Reviewed & Updated per EMR.   Pertinent Historical Findings include:  Past Medical History:  Diagnosis Date  . Acute blood loss as cause of postoperative anemia   . Anemia 01/22/2019  . Chest pain    a. prior coronary CT without CAD.  Marland Kitchen Elevated sed rate 01/22/2019  . Esophageal dysmotility   . Essential hypertension 07/08/2016  . GERD (gastroesophageal reflux disease)   . MGUS (monoclonal gammopathy of unknown significance)   . Numbness and tingling   . PAD (peripheral artery disease) ([place])    a. 05/2018: PV angio subacute thrombotic occlusion of her popliteal artery. She underwent successful penumbra aspiration thrombectomy, PTA and self-expanding stenting using overlapping Tigris self-expanding stents of a long thrombotic occlusion of the popliteal, anterior tibial and tibioperoneal trunk. Procedure c/b pseudoaneurysm/ABL anemia.  . 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[place])   . Uterine fibroid   . Vitamin D deficiency     Past Surgical History:  Procedure Laterality Date  . ABDOMINAL AORTOGRAM W/LOWER EXTREMITY Right 05/08/2018   Procedure: ABDOMINAL AORTOGRAM W/LOWER EXTREMITY;  Surgeon: Lorretta Harp, MD;  Location: [place] [facility];  Service: Cardiovascular;  Laterality: Right;  . BREAST BIOPSY    . BREAST LUMPECTOMY    . LOWER EXTREMITY ANGIOGRAPHY  07/12/2019   Procedure: Lower Extremity Angiography;  Surgeon: Lorretta Harp, MD;  Location: [place] [facility];  Service: Cardiovascular;;  . lump removed from breast at age 65    . PERIPHERAL VASCULAR  BALLOON ANGIOPLASTY  07/12/2019   Procedure: PERIPHERAL VASCULAR BALLOON ANGIOPLASTY;  Surgeon: Lorretta Harp, MD;  Location: [place] [facility];  Service: Cardiovascular;;  . PERIPHERAL VASCULAR INTERVENTION Right 05/08/2018   Procedure: PERIPHERAL VASCULAR INTERVENTION;  Surgeon: Lorretta Harp, MD;  Location: [place] [facility];  Service: Cardiovascular;  Laterality: Right;  Anterior tibial and popliteal stents  . PERIPHERAL VASCULAR THROMBECTOMY Right 05/08/2018   Procedure: PERIPHERAL VASCULAR THROMBECTOMY;  Surgeon: Lorretta Harp, MD;  Location: [place] [facility];  Service: Cardiovascular;  Laterality: Right;  Popliteal, tibioperoneal trunk, Anterior tibial, Posterior tibial    Family History  Problem Relation Age of Onset  . Hypertension Mother   . Alzheimer's disease Father   . Healthy Son   . Colon polyps Neg Hx   . Crohn's disease Neg Hx   . Rectal cancer Neg Hx   . Stomach cancer Neg Hx   . Pancreatic cancer Neg Hx     Social History   Socioeconomic History  . Marital status: Single    Spouse name: Not on file  . Number of children: 1  . Years of education: two years of college  . Highest education level: Not on file  Occupational History  . Not on file  Tobacco Use  . Smoking status: Never Smoker  . Smokeless tobacco: Never Used  Vaping Use  . Vaping Use: Never used  Substance and Sexual Activity  . Alcohol use: No  . Drug use: No  . Sexual activity: Yes  Other Topics  Concern  . Not on file  Social History Narrative   Lives alone.   Right-handed.   No daily use of caffeine.   Social Determinants of Health   Financial Resource Strain: Not on file  Food Insecurity: Not on file  Transportation Needs: Not on file  Physical Activity: Not on file  Stress: Not on file  Social Connections: Not on file  Intimate Partner Violence: Not on file     PHYSICAL EXAM:  VS: BP 120/72 (BP Location: Left Arm, Patient Position: Sitting, Cuff Size: Large)   Ht  5\' 5"  (1.651 m)   Wt 295 lb (133.8 kg)   BMI 49.09 kg/m  Physical Exam Gen: NAD, alert, cooperative with exam, well-appearing MSK:  Left knee: Normal range of motion. Normal strength resistance. Neurovascular intact  Limited ultrasound: Left knee:  Mild effusion within the suprapatellar pouch. Normal-appearing quadricep patellar tendon. Moderate to severe joint space narrowing on the medial compartment Fairly normal lateral joint space and meniscus  Summary: Osteoarthritis appreciated  Ultrasound and interpretation by Clearance Coots, MD    ASSESSMENT & PLAN:   Primary osteoarthritis of left knee Acute on chronic in nature.  Has changes medially that are likely the source of her pain. -Counseled on home exercise therapy and supportive care. -Prednisone. - Referral to physical therapy. -Could consider aspiration injection or imaging.

## 2020-09-10 NOTE — Telephone Encounter (Signed)
-----   Message from Noralyn Pick, NP sent at 09/09/2020  7:35 PM EDT ----- Marcie Bal, please call the patient and verify if she is ready to schedule a colonoscopy at [facility] with Dr. Havery Moros.  If she has lost weight, then bring her in the office to re-weigh. If BMI less than 50 then she can schedule colonoscopy at [facility]. If she declines to schedule a colonoscopy then please let Dr. Duanne Guess know. Thank you for keeping track of this patient.  Colleen  ----- Message ----- From: Roetta Sessions, CMA Sent: 09/09/2020   3:59 PM EDT To: Eleanora Neighbor, CMA, #  OK. Perhaps we should take her off Dr. Doyne Keel hospital list if she isn't interested... ? I just don't want her to slip through the cracks. She needs a colonoscopy and she is soooo close to being able to be done upstairs.    Thanks, Jan    ----- Message ----- From: Cardell Peach I, CMA Sent: 09/09/2020   2:49 PM EDT To: Roetta Sessions, CMA  I sent her a MyChart message and it shows that she has read it. I honestly do not think she is interested in having it done at all. ----- Message ----- From: Roetta Sessions, CMA Sent: 09/09/2020   1:44 PM EDT To: Eleanora Neighbor, [place] there. Do you think we should just schedule this lady at the hospital since she needs a colon?she is obviously not going to lose the weight. Im happy to do it if you don't have time.  Thx, Jan

## 2020-09-10 NOTE — Telephone Encounter (Signed)
Recall colon placed for 12-2020

## 2020-09-10 NOTE — Telephone Encounter (Signed)
Noted. Dr. Havery Moros, shall we send a recall letter in 3 or 4 months?

## 2020-09-10 NOTE — Patient Instructions (Signed)
Good to see you  Please try ice  Please try the exercises  Please try the physical therapy  Please send me a message in MyChart with any questions or updates.  Please see me back in 4 weeks.   --Dr. Raeford Razor

## 2020-09-10 NOTE — Telephone Encounter (Signed)
Thanks, yes let's send a reminder again at that time if we don't hear from her in the interim.

## 2020-09-11 NOTE — Assessment & Plan Note (Signed)
Acute on chronic in nature.  Has changes medially that are likely the source of her pain. -Counseled on home exercise therapy and supportive care. -Prednisone. - Referral to physical therapy. -Could consider aspiration injection or imaging.

## 2020-10-03 ENCOUNTER — Ambulatory Visit (HOSPITAL_COMMUNITY)
Admission: RE | Admit: 2020-10-03 | Discharge: 2020-10-03 | Disposition: A | Discharge status: Home / Self Care | Payer: BC Managed Care – PPO | Source: Ambulatory Visit | Attending: Cardiovascular Disease | Admitting: Cardiovascular Disease

## 2020-10-03 ENCOUNTER — Other Ambulatory Visit: Payer: Self-pay

## 2020-10-03 DIAGNOSIS — I739 Peripheral vascular disease, unspecified: Secondary | ICD-10-CM

## 2020-10-03 DIAGNOSIS — Z9862 Peripheral vascular angioplasty status: Secondary | ICD-10-CM

## 2020-10-03 NOTE — Telephone Encounter (Signed)
Spoke to patient she stated she wanted to file a complaint against tech who did her leg dopplers today.Advised I will send message to clinical supervisor and Engineer, building services.

## 2020-10-03 NOTE — Telephone Encounter (Signed)
Please see updated encounter  

## 2020-10-08 ENCOUNTER — Ambulatory Visit (INDEPENDENT_AMBULATORY_CARE_PROVIDER_SITE_OTHER): Payer: BC Managed Care – PPO | Admitting: Cardiovascular Disease

## 2020-10-08 ENCOUNTER — Ambulatory Visit: Payer: BC Managed Care – PPO | Admitting: Cardiovascular Disease

## 2020-10-08 ENCOUNTER — Ambulatory Visit: Payer: BC Managed Care – PPO | Admitting: Family Medicine

## 2020-10-08 ENCOUNTER — Other Ambulatory Visit: Payer: Self-pay

## 2020-10-08 ENCOUNTER — Encounter: Payer: Self-pay | Admitting: Cardiovascular Disease

## 2020-10-08 VITALS — BP 122/80 | HR 92 | Ht 65.0 in | Wt 293.0 lb

## 2020-10-08 DIAGNOSIS — I739 Peripheral vascular disease, unspecified: Secondary | ICD-10-CM | POA: Diagnosis not present

## 2020-10-08 NOTE — Progress Notes (Signed)
10/08/2020 Christina [name]   06/11/1965  [id number]  Primary Physician Pcp, No Primary Cardiologist: Lorretta Harp MD Renae Gloss  HPI:  Christina [name] is a 55 y.o.  severely overweight single African-American female mother of 1 child, grandmother and 3 grandchildren who works Chief Operating Officer at [employer]. She was referred by Dr. Acie Fredrickson for peripheral vascular evaluation because of right calf claudication which waslifestyle limiting.I last saw her in the office4/10/2019.She has a history of hypertension. She has chronic chest pain with a recent negative Myoview and a negative coronary CTA 1 year ago. She had fairly recent onset right calf pain approxi-1 month ago that occurred when she woke up 1 morning and has been fairly persistent with ambulation. She had [name] extremity arterial Doppler studies performed 04/28/2018 revealing a right ABI 0.61 with an occluded right popliteal artery.  I performedperipheral angiography 05/08/2018 revealing occluded above-knee popliteal artery extending down below the knee into the proximal anterior tibial and tibioperoneal trunk. She had a long complex procedure with penumbra aspiration thrombectomy followed by balloon angioplasty and implantation of 2 overlapping Tigris self-expanding stents beginning in the popliteal artery extending down into the anterior tibial artery. Unfortunately her tibioperoneal trunk remains occluded. Her Dopplers normalized and her pain is resolved. She did unfortunately have a left common femoral pseudoaneurysm and ultimately underwent ultrasound-guided thrombin injection and successfully was closed.  . She is on Eliquis and clopidogrel. Dopplers performed 05/26/2018 revealed a right ABI of 1.24 with a patent stent. She does continue to complain of atypical chest pain beginning under her right breast and going into her neck and jaw. She had a negative Myoview stress test in December of last year and a  coronary CTA revealing a coronary calcium score of 0 with normal coronary arteries. In addition, she is taken sublingual nitroglycerin for this which does not work. I reassured her that I do not think this is related to coronary artery disease.  She continues to have right calf claudication similar to her preintervention symptoms as well as numbness of her toes.She describes recurrent claudication over the last 2 months when walking in [place]. Her most recent Dopplers show decline in her right ABI from 1.24 down to 0.93 with a moderate increase in the velocities within the tibioperoneal stent. Based on this we decided to proceed with outpatient angiography  I performed peripheral angiography on her via the left femoral approach 07/12/2019 revealing 80% "in-stent restenosis within the popliteal and tibioperoneal stent.  Performed drug-coated balloon angioplasty with excellent result.  Her claudication has resolved and her Dopplers are normalized.  She is back on triple therapy for 1 month after which aspirin will be discontinued.  Since I saw her a year ago she enjoyed the absence of claudication and was able to walk without limitation up until several months ago when she noticed recurrent claudication similar to her previous stent symptoms.  Her Dopplers performed 10/03/2020 revealed a slight decline in her right ABI from 1.05 down to 0.90 with moderate elevation of velocities within her stent.   Current Meds  Medication Sig  . apixaban (ELIQUIS) 5 MG TABS tablet Take 1 tablet (5 mg total) by mouth 2 (two) times daily.  . carvedilol (COREG) 6.25 MG tablet Take 1 tablet (6.25 mg total) by mouth 2 (two) times daily.  . Cholecalciferol (VITAMIN D) 50 MCG (2000 UT) tablet Take 2,000 Units by mouth daily in the afternoon.  . clopidogrel (PLAVIX) 75 MG tablet Take 1  tablet by mouth once daily  . irbesartan (AVAPRO) 150 MG tablet Take 1 tablet (150 mg total) by mouth daily.  Marland Kitchen MAGNESIUM CITRATE PO  Take 1 tablet by mouth daily.  . megestrol (MEGACE) 40 MG tablet Take 1 tablet (40 mg total) by mouth daily in the afternoon.  . metFORMIN (GLUCOPHAGE-XR) 500 MG 24 hr tablet Take 1 tablet by mouth daily.  . potassium chloride (KLOR-CON) 10 MEQ tablet Take 1 tablet by mouth daily.  . vitamin B-12 (CYANOCOBALAMIN) 1000 MCG tablet Take 1,000 mcg by mouth daily in the afternoon.   . [DISCONTINUED] predniSONE (DELTASONE) 5 MG tablet Take 6 pills for first day, 5 pills second day, 4 pills third day, 3 pills fourth day, 2 pills the fifth day, and 1 pill sixth day.     Allergies  Allergen Reactions  . Lisinopril Cough    Social History   Socioeconomic History  . Marital status: Single    Spouse name: Not on file  . Number of children: 1  . Years of education: two years of college  . Highest education level: Not on file  Occupational History  . Not on file  Tobacco Use  . Smoking status: Never Smoker  . Smokeless tobacco: Never Used  Vaping Use  . Vaping Use: Never used  Substance and Sexual Activity  . Alcohol use: No  . Drug use: No  . Sexual activity: Yes  Other Topics Concern  . Not on file  Social History Narrative   Lives alone.   Right-handed.   No daily use of caffeine.   Social Determinants of Health   Financial Resource Strain: Not on file  Food Insecurity: Not on file  Transportation Needs: Not on file  Physical Activity: Not on file  Stress: Not on file  Social Connections: Not on file  Intimate Partner Violence: Not on file     Review of Systems: General: negative for chills, fever, night sweats or weight changes.  Cardiovascular: negative for chest pain, dyspnea on exertion, edema, orthopnea, palpitations, paroxysmal nocturnal dyspnea or shortness of breath Dermatological: negative for rash Respiratory: negative for cough or wheezing Urologic: negative for hematuria Abdominal: negative for nausea, vomiting, diarrhea, bright red blood per rectum, melena,  or hematemesis Neurologic: negative for visual changes, syncope, or dizziness All other systems reviewed and are otherwise negative except as noted above.    Blood pressure 122/80, pulse 92, height 5\' 5"  (1.651 m), weight 293 lb (132.9 kg).  General appearance: alert and no distress Neck: no adenopathy, no carotid bruit, no JVD, supple, symmetrical, trachea midline and thyroid not enlarged, symmetric, no tenderness/mass/nodules Lungs: clear to auscultation bilaterally Heart: regular rate and rhythm, S1, S2 normal, no murmur, click, rub or gallop Extremities: extremities normal, atraumatic, no cyanosis or edema Pulses: 2+ and symmetric Skin: Skin color, texture, turgor normal. No rashes or lesions Neurologic: Alert and oriented X 3, normal strength and tone. Normal symmetric reflexes. Normal coordination and gait  EKG sinus rhythm at 91 with borderline LVH voltage.  I personally reviewed this EKG.  ASSESSMENT AND PLAN:   Claudication in peripheral vascular disease ([place]) History of peripheral arterial disease status post peripheral angiography performed by myself 05/08/2018 revealing an occluded above-the-knee popliteal artery extending below the knee to the proximal anterior tibial and tibioperoneal trunk.  I performed a long complex procedure with penumbra aspiration thrombectomy followed by balloon angioplasty and implantation of 2 overlapping Tigris because of recurrent symptoms and decline in her ABI I restudied her  07/12/2019 revealing 80% "in-stent restenosis within the popliteal and tibioperoneal stent.  I performed shockwave balloon angioplasty followed by drug-coated balloon angioplasty with excellent result.  Her Dopplers normalized and her claudication resolved.  Over the last several months she has had recurrent claudication with a slight decline in 8 her ABI from 1.05 down to 0.90 with a moderately elevated velocity within her tibioperoneal stent.  At this point, I do not think that we  intervening would be a good long-term solution.  I am referring her to Dr. Fortunato Curling for consideration of above the knee to tibial bypass grafting.      Lorretta Harp MD FACP,FACC,FAHA, [facility] 10/08/2020 3:25 PM

## 2020-10-08 NOTE — Patient Instructions (Signed)
Medication Instructions:  Your physician recommends that you continue on your current medications as directed. Please refer to the Current Medication list given to you today.  *If you need a refill on your cardiac medications before your next appointment, please call your pharmacy*   Testing/Procedures/Referrals: You have been referred to Dr. Monica Martinez with [facility]    Follow-Up: At [facility], you and your health needs are our priority.  As part of our continuing mission to provide you with exceptional heart care, we have created designated Provider Care Teams.  These Care Teams include your primary Cardiologist (physician) and Advanced Practice Providers (APPs -  Physician Assistants and Nurse Practitioners) who all work together to provide you with the care you need, when you need it.  We recommend signing up for the patient portal called "MyChart".  Sign up information is provided on this After Visit Summary.  MyChart is used to connect with patients for Virtual Visits (Telemedicine).  Patients are able to view lab/test results, encounter notes, upcoming appointments, etc.  Non-urgent messages can be sent to your provider as well.   To learn more about what you can do with MyChart, go to [email or web address].    Your next appointment:   6 month(s)  The format for your next appointment:   In Person  Provider:    Dr. Gwenlyn Found    Other Instructions

## 2020-10-08 NOTE — Telephone Encounter (Signed)
10/08/20 spoke with patient for about 15 minutes regarding her encounter during vascular testing on 10/03/20.  She states when the tech came to get her from the waiting room, she simply called her name, but did not greet her/say Hello.  Tech asked her if she needed to use the restroom, and once patient was done, tech walked quickly ahead of her without waiting to see if she was keeping up, and "luckily I had been here before and knew where to turn down the hall."  When patient sat in the chair, tech told her go ahead and get on the bed.  Patient states that the BP cuffs were inflated very high, and it was painful (and never had been before), and patient states they cuff was left inflated for a long time.  When the tech scanned her legs, she states it was so painful/tech was pressing so hard, she was shaking in pain.  When she was all done, patient struggled to get off bed, and tech did not offer to help her up.  Tech did not escort her out, or say goodbye, just told her to take a left, and another left and go on out the door. When patient got to the car, she sat and cried due to the discomfort. Overall, she said it was the tech's attitude that bothered her the most because she did not seem friendly or helpful; and she has had this test performed multiple times and it has never been this bad. I thanked the patient for bringing her concerns to our attention, because that kind of feedback is needed to assure this kind of thing does not happen again.  I assured the patient that I would address her concerns with the tech, and patient prefers I do that without disclosing to the tech who the patient is, since she is here often for appointments.  She also mentioned the appointment survey, and I encouraged her to fill it out with her honest feedback. I gave her my name, and direct phone number in case she thought of anything else.   She was appreciative of the call.

## 2020-10-08 NOTE — Assessment & Plan Note (Signed)
History of peripheral arterial disease status post peripheral angiography performed by myself 05/08/2018 revealing an occluded above-the-knee popliteal artery extending below the knee to the proximal anterior tibial and tibioperoneal trunk.  I performed a long complex procedure with penumbra aspiration thrombectomy followed by balloon angioplasty and implantation of 2 overlapping Tigris because of recurrent symptoms and decline in her ABI I restudied her 07/12/2019 revealing 80% "in-stent restenosis within the popliteal and tibioperoneal stent.  I performed shockwave balloon angioplasty followed by drug-coated balloon angioplasty with excellent result.  Her Dopplers normalized and her claudication resolved.  Over the last several months she has had recurrent claudication with a slight decline in 8 her ABI from 1.05 down to 0.90 with a moderately elevated velocity within her tibioperoneal stent.  At this point, I do not think that we intervening would be a good long-term solution.  I am referring her to Dr. Fortunato Curling for consideration of above the knee to tibial bypass grafting.

## 2020-10-09 ENCOUNTER — Other Ambulatory Visit: Payer: Self-pay

## 2020-10-09 ENCOUNTER — Ambulatory Visit: Payer: BC Managed Care – PPO | Admitting: Family Medicine

## 2020-10-09 DIAGNOSIS — I70229 Atherosclerosis of native arteries of extremities with rest pain, unspecified extremity: Secondary | ICD-10-CM

## 2020-10-09 DIAGNOSIS — Z9889 Other specified postprocedural states: Secondary | ICD-10-CM

## 2020-10-09 NOTE — Addendum Note (Signed)
Addended byDoylene Bode on: 10/09/2020 05:06 PM   Modules accepted: Orders

## 2020-10-13 ENCOUNTER — Telehealth: Payer: Self-pay

## 2020-10-13 NOTE — Telephone Encounter (Signed)
Pt called to report that they need assistance w/cost of eliquis SO I APPLIED FOR A COPAY CARD ONLINE and approved. Pt voiced understanding

## 2020-10-14 ENCOUNTER — Encounter: Payer: Self-pay | Admitting: Vascular Surgery

## 2020-10-14 ENCOUNTER — Ambulatory Visit (INDEPENDENT_AMBULATORY_CARE_PROVIDER_SITE_OTHER): Payer: BC Managed Care – PPO | Admitting: Vascular Surgery

## 2020-10-14 ENCOUNTER — Other Ambulatory Visit: Payer: Self-pay

## 2020-10-14 ENCOUNTER — Ambulatory Visit (HOSPITAL_COMMUNITY)
Admission: RE | Admit: 2020-10-14 | Discharge: 2020-10-14 | Disposition: A | Discharge status: Home / Self Care | Payer: BC Managed Care – PPO | Source: Ambulatory Visit | Attending: Vascular Surgery | Admitting: Vascular Surgery

## 2020-10-14 VITALS — BP 137/90 | HR 82 | Temp 97.9°F | Resp 18 | Ht 65.0 in | Wt 292.0 lb

## 2020-10-14 DIAGNOSIS — Z9889 Other specified postprocedural states: Secondary | ICD-10-CM | POA: Insufficient documentation

## 2020-10-14 DIAGNOSIS — I739 Peripheral vascular disease, unspecified: Secondary | ICD-10-CM

## 2020-10-14 DIAGNOSIS — I70229 Atherosclerosis of native arteries of extremities with rest pain, unspecified extremity: Secondary | ICD-10-CM | POA: Insufficient documentation

## 2020-10-14 NOTE — Progress Notes (Signed)
Patient name: Christina [name] MRN: [id number] DOB: Jan 09, 1966 Sex: female  REASON FOR CONSULT: Evaluate for right leg tibial bypass  HPI: Christina [name] is a 55 y.o. female, with history of peripheral arterial disease, hypertension, MGUS, morbid obesity who presents for evaluation of right lower extremity tibial bypass and is referred by Dr. Gwenlyn Found with cardiology.  Patient has previously undergone right popliteal, TP trunk, AT, and PT penumbra mechanical thrombectomy with subsequent balloon angioplasty and then stenting with a tigris stent of the right popliteal and AT on 05/08/2018 by Dr. Sherrie George.  At the time sounds like she had short distance lifestyle limiting claudication.  She then had recurrent in-stent stenosis and underwent balloon angioplasty of the popliteal and AT stent for 80% in-stent stenosis on 07/12/2019 by Dr. Gwenlyn Found.  Now over the last several months has had recurrent claudication in the right lower extremity and given poor durability of the stents she has been referred for possible bypass.  She is on Eliquis.  She does describe some numbness in the right big toe at times as well.  No tissue loss.  Denies tobacco abuse.  Past Medical History:  Diagnosis Date   Acute blood loss as cause of postoperative anemia    Anemia 01/22/2019   Chest pain    a. prior coronary CT without CAD.   Elevated sed rate 01/22/2019   Esophageal dysmotility    Essential hypertension 07/08/2016   GERD (gastroesophageal reflux disease)    MGUS (monoclonal gammopathy of unknown significance)    Numbness and tingling    PAD (peripheral artery disease) ([place])    a. 05/2018: PV angio subacute thrombotic occlusion of her popliteal artery. She underwent successful penumbra aspiration thrombectomy, PTA and self-expanding stenting using overlapping Tigris self-expanding stents of a long thrombotic occlusion of the popliteal, anterior tibial and tibioperoneal trunk. Procedure c/b pseudoaneurysm/ABL anemia.    Pseudoaneurysm following procedure [facility])    Uterine fibroid    Vitamin D deficiency     Past Surgical History:  Procedure Laterality Date   ABDOMINAL AORTOGRAM W/LOWER EXTREMITY Right 05/08/2018   Procedure: ABDOMINAL AORTOGRAM W/LOWER EXTREMITY;  Surgeon: Lorretta Harp, MD;  Location: [place] [facility];  Service: Cardiovascular;  Laterality: Right;   BREAST BIOPSY     BREAST LUMPECTOMY     LOWER EXTREMITY ANGIOGRAPHY  07/12/2019   Procedure: Lower Extremity Angiography;  Surgeon: Lorretta Harp, MD;  Location: [place] [facility];  Service: Cardiovascular;;   lump removed from breast at age 34     PERIPHERAL VASCULAR BALLOON ANGIOPLASTY  07/12/2019   Procedure: PERIPHERAL VASCULAR BALLOON ANGIOPLASTY;  Surgeon: Lorretta Harp, MD;  Location: [place] [facility];  Service: Cardiovascular;;   PERIPHERAL VASCULAR INTERVENTION Right 05/08/2018   Procedure: PERIPHERAL VASCULAR INTERVENTION;  Surgeon: Lorretta Harp, MD;  Location: [place] [facility];  Service: Cardiovascular;  Laterality: Right;  Anterior tibial and popliteal stents   PERIPHERAL VASCULAR THROMBECTOMY Right 05/08/2018   Procedure: PERIPHERAL VASCULAR THROMBECTOMY;  Surgeon: Lorretta Harp, MD;  Location: [place] [facility];  Service: Cardiovascular;  Laterality: Right;  Popliteal, tibioperoneal trunk, Anterior tibial, Posterior tibial    Family History  Problem Relation Age of Onset   Hypertension Mother    Alzheimer's disease Father    Healthy Son    Colon polyps Neg Hx    Crohn's disease Neg Hx    Rectal cancer Neg Hx    Stomach cancer Neg Hx    Pancreatic cancer Neg Hx  SOCIAL HISTORY: Social History   Socioeconomic History   Marital status: Single    Spouse name: Not on file   Number of children: 1   Years of education: two years of college   Highest education level: Not on file  Occupational History   Not on file  Tobacco Use   Smoking status: Never   Smokeless tobacco: Never  Vaping Use    Vaping Use: Never used  Substance and Sexual Activity   Alcohol use: No   Drug use: No   Sexual activity: Yes  Other Topics Concern   Not on file  Social History Narrative   Lives alone.   Right-handed.   No daily use of caffeine.   Social Determinants of Health   Financial Resource Strain: Not on file  Food Insecurity: Not on file  Transportation Needs: Not on file  Physical Activity: Not on file  Stress: Not on file  Social Connections: Not on file  Intimate Partner Violence: Not on file    Allergies  Allergen Reactions   Atorvastatin     Other reaction(s): Other (See Comments) Myalgias   Lisinopril Cough    Current Outpatient Medications  Medication Sig Dispense Refill   apixaban (ELIQUIS) 5 MG TABS tablet Take 1 tablet (5 mg total) by mouth 2 (two) times daily. 180 tablet 1   carvedilol (COREG) 6.25 MG tablet Take 1 tablet (6.25 mg total) by mouth 2 (two) times daily. 180 tablet 2   Cholecalciferol (VITAMIN D) 50 MCG (2000 UT) tablet Take 2,000 Units by mouth daily in the afternoon.     clopidogrel (PLAVIX) 75 MG tablet Take 1 tablet by mouth once daily 90 tablet 0   MAGNESIUM CITRATE PO Take 1 tablet by mouth daily.     megestrol (MEGACE) 40 MG tablet Take 1 tablet (40 mg total) by mouth daily in the afternoon. 90 tablet 3   metFORMIN (GLUCOPHAGE-XR) 500 MG 24 hr tablet Take 1 tablet by mouth daily.     potassium chloride (KLOR-CON) 10 MEQ tablet Take 1 tablet by mouth daily.     vitamin B-12 (CYANOCOBALAMIN) 1000 MCG tablet Take 1,000 mcg by mouth daily in the afternoon.      irbesartan (AVAPRO) 150 MG tablet Take 1 tablet (150 mg total) by mouth daily. (Patient not taking: Reported on 10/14/2020) 30 tablet 6   No current facility-administered medications for this visit.    REVIEW OF SYSTEMS:  [X]  denotes positive finding, [ ]  denotes negative finding Cardiac  Comments:  Chest pain or chest pressure:    Shortness of breath upon exertion:    Short of breath  when lying flat:    Irregular heart rhythm:        Vascular    Pain in calf, thigh, or hip brought on by ambulation: x Right  Pain in feet at night that wakes you up from your sleep:     Blood clot in your veins:    Leg swelling:         Pulmonary    Oxygen at home:    Productive cough:     Wheezing:         Neurologic    Sudden weakness in arms or legs:     Sudden numbness in arms or legs:     Sudden onset of difficulty speaking or slurred speech:    Temporary loss of vision in one eye:     Problems with dizziness:  Gastrointestinal    Blood in stool:     Vomited blood:         Genitourinary    Burning when urinating:     Blood in urine:        Psychiatric    Major depression:         Hematologic    Bleeding problems:    Problems with blood clotting too easily:        Skin    Rashes or ulcers:        Constitutional    Fever or chills:      PHYSICAL EXAM: Vitals:   10/14/20 1022  BP: 137/90  Pulse: 82  Resp: 18  Temp: 97.9 F (36.6 C)  TempSrc: Temporal  SpO2: 97%  Weight: 292 lb (132.5 kg)  Height: 5\' 5"  (1.651 m)    GENERAL: The patient is a well-nourished female, in no acute distress. The vital signs are documented above. CARDIAC: There is a regular rate and rhythm.  VASCULAR:  Bilateral femoral pulses palpable No tissue loss in the right lower extremity PULMONARY: No respiratory distress ABDOMEN: Soft and non-tender with normal pitched bowel sounds.  MUSCULOSKELETAL: There are no major deformities or cyanosis. NEUROLOGIC: No focal weakness or paresthesias are detected. SKIN: There are no ulcers or rashes noted. PSYCHIATRIC: The patient has a normal affect.  DATA:   ABIs 10/03/2020 0.9 on the right nearly triphasic and 1.23 on the left triphasic  Right leg arterial duplex 10/03/2020 shows evidence of greater than 50% stenosis in the mid stent in her righht popliteal artery with velocity of 264  Assessment/Plan:  55 year old female who  has recurrent stenosis in her right popliteal/AT stent with recurrent short distance lifestyle limiting claudication in the right lower extremity.  The stents were initially placed in 2020 and she has undergone additional intervention in 2021 now with recurrent stenosis again.  I think she would best be served with a tibial bypass and agree with Dr. Gwenlyn Found.  I reviewed her last arteriogram images with her today and looks like she could have an above-knee popliteal to potentially TP trunk bypass and she has two-vessel runoff in the AT and PT.  I do think she needs an updated arteriogram to ensure her targets have not changed over the past 16 months.  I will see if Dr. Gwenlyn Found can get her scheduled for right leg arteriogram.  Her vein mapping today shows she does have usable saphenous vei for conduit.  I offered to pick a surgery today today and she wants to go review her work schedule and will call back to schedule through our office.  Discussed risk benefits of surgery.  Discussed her being out of work up to 4 to 6 weeks.   Marty Heck, MD [facility] Office: [phone]

## 2020-10-21 ENCOUNTER — Telehealth: Payer: Self-pay | Admitting: *Deleted

## 2020-10-21 ENCOUNTER — Other Ambulatory Visit: Payer: Self-pay

## 2020-10-21 NOTE — Telephone Encounter (Signed)
Left message for pt to call back regarding return office visit with Dr. Gwenlyn Found.

## 2020-10-21 NOTE — Telephone Encounter (Signed)
Spoke with patient and advised per Dr. Estanislado Pandy we can order [facility] labs and then schedule a virtual visit. Patient is in agreement and requested the order form be mailed to her. Patient advised once lab results received we can schedule the visit. Patient expressed understanding. Verified address with patient.

## 2020-10-21 NOTE — Telephone Encounter (Signed)
Spoke with pt regarding tibial bypass needed (to be done by Dr. Carlis Abbott). Pt is reluctant to have the surgery with Dr. Carlis Abbott, pt states that she does not like the way the consultation went and she does not wish to move forward with Dr. Carlis Abbott. Pt states that she loves Dr. Gwenlyn Found and would prefer to stay with him if at all possible. Pt would like to know if there is anything Dr. Gwenlyn Found can do from his stand point. Will defer to Dr. Gwenlyn Found to advise.

## 2020-10-22 ENCOUNTER — Telehealth: Payer: Self-pay | Admitting: Cardiovascular Disease

## 2020-10-22 NOTE — Telephone Encounter (Signed)
Follow Up:      Pt is returning call from [place] about getting an earlier appt with Dr Gwenlyn Found. Dr Gwenlyn Found will probably advise where to put pt on his schedule.

## 2020-10-22 NOTE — Telephone Encounter (Signed)
Did not need this encounter °

## 2020-10-22 NOTE — Telephone Encounter (Signed)
Spoke to patient, appt scheduled 7/8 to discuss. Patient aware.

## 2020-10-23 ENCOUNTER — Ambulatory Visit (HOSPITAL_BASED_OUTPATIENT_CLINIC_OR_DEPARTMENT_OTHER): Payer: BC Managed Care – PPO

## 2020-10-27 ENCOUNTER — Inpatient Hospital Stay (HOSPITAL_BASED_OUTPATIENT_CLINIC_OR_DEPARTMENT_OTHER): Admission: RE | Admit: 2020-10-27 | Payer: BC Managed Care – PPO | Source: Ambulatory Visit

## 2020-10-28 ENCOUNTER — Telehealth: Payer: Self-pay

## 2020-10-28 NOTE — Telephone Encounter (Signed)
Pt called stating she is still bleeding while taking Megace 40 mg BID x 2 daily. I advised pt to continue taking medication. Pt made aware that her Mychart message was sent to the provider. Understanding was voiced. Kamori Barbier l Ahamed Hofland, CMA

## 2020-10-31 ENCOUNTER — Encounter: Payer: Self-pay | Admitting: Rheumatology

## 2020-11-04 ENCOUNTER — Encounter: Payer: Self-pay | Admitting: General Practice

## 2020-11-07 ENCOUNTER — Other Ambulatory Visit: Payer: Self-pay

## 2020-11-07 ENCOUNTER — Ambulatory Visit (INDEPENDENT_AMBULATORY_CARE_PROVIDER_SITE_OTHER): Payer: BC Managed Care – PPO | Admitting: Cardiovascular Disease

## 2020-11-07 ENCOUNTER — Encounter: Payer: Self-pay | Admitting: Cardiovascular Disease

## 2020-11-07 VITALS — BP 128/81 | HR 97 | Ht 65.0 in | Wt 294.4 lb

## 2020-11-07 DIAGNOSIS — I739 Peripheral vascular disease, unspecified: Secondary | ICD-10-CM | POA: Diagnosis not present

## 2020-11-07 NOTE — Progress Notes (Addendum)
I saw Christina [name] in the office today.  I recently saw her on 10/08/2020 because of her PAD and right tibioperoneal trunk and posterior tibial stent which I performed reintervention on 07/12/2019.  She has had recurrent symptoms with Dopplers performed 10/03/2020 revealing a decline in her right ABI and moderately increased velocities within her stent.  She saw Dr. Fortunato Curling, vascular surgeon who offered her fem-tib bypass grafting which she is contemplating.  At this point, she is unsure whether to pursue recurrent endovascular therapy by myself or surgical revascularization.  I will see her back in 6 months and when she makes a decision to move forward with either of those 2 approaches.  Patient apparently has elective upcoming laparoscopic hysterectomy scheduled which she is cleared to have done from a cardiovascular standpoint.  Lorretta Harp, M.D., [place], [facility], [place] [address]. [place], [place]  [address]  [phone] 11/07/2020 3:43 PM

## 2020-11-07 NOTE — Patient Instructions (Addendum)
Medication Instructions:  No Changes In Medications at this time.  *If you need a refill on your cardiac medications before your next appointment, please call your pharmacy*  Follow-Up: At [facility], you and your health needs are our priority.  As part of our continuing mission to provide you with exceptional heart care, we have created designated Provider Care Teams.  These Care Teams include your primary Cardiologist (physician) and Advanced Practice Providers (APPs -  Physician Assistants and Nurse Practitioners) who all work together to provide you with the care you need, when you need it.  Your next appointment:   6 MONTHS   The format for your next appointment:   In Person  Provider:   Quay Burow, MD

## 2020-11-10 ENCOUNTER — Ambulatory Visit (HOSPITAL_BASED_OUTPATIENT_CLINIC_OR_DEPARTMENT_OTHER): Payer: BC Managed Care – PPO

## 2020-11-10 NOTE — Telephone Encounter (Signed)
[facility] labs received, please call patient to schedule virtual visit appt. Thank you.

## 2020-11-10 NOTE — Telephone Encounter (Signed)
Patient scheduled virtual appt for Thursday, 11/13/20 at 3:45 pm to discuss labs.  Patient states if Dr. Estanislado Pandy has any cancellations she would prefer Wednesday, 11/12/20 after 2:00 pm.

## 2020-11-12 ENCOUNTER — Other Ambulatory Visit (HOSPITAL_COMMUNITY)
Admission: RE | Admit: 2020-11-12 | Discharge: 2020-11-12 | Disposition: A | Discharge status: Home / Self Care | Payer: BC Managed Care – PPO | Source: Ambulatory Visit | Attending: Obstetrics & Gynecology | Admitting: Obstetrics & Gynecology

## 2020-11-12 ENCOUNTER — Other Ambulatory Visit: Payer: Self-pay

## 2020-11-12 ENCOUNTER — Ambulatory Visit (INDEPENDENT_AMBULATORY_CARE_PROVIDER_SITE_OTHER): Payer: BC Managed Care – PPO | Admitting: Obstetrics & Gynecology

## 2020-11-12 ENCOUNTER — Other Ambulatory Visit: Payer: Self-pay | Admitting: Cardiovascular Disease

## 2020-11-12 VITALS — BP 128/74 | HR 84 | Wt 298.0 lb

## 2020-11-12 DIAGNOSIS — N950 Postmenopausal bleeding: Secondary | ICD-10-CM

## 2020-11-12 NOTE — Patient Instructions (Signed)
Total Laparoscopic Hysterectomy A total laparoscopic hysterectomy is a minimally invasive surgery to remove the uterus and cervix. The fallopian tubes and ovaries can also be removed during this surgery, if necessary. This procedure may be done to treat problems such as: Growths in the uterus (uterine fibroids) that are not cancer but cause symptoms. A condition that causes the lining of the uterus to grow in other areas (endometriosis). Problems with pelvic support. Cancer of the cervix, ovaries, uterus, or tissue that lines the uterus (endometrium). Excessive bleeding in the uterus. After this procedure, you will no longer be able to have a baby, and you willno longer have a menstrual period. Tell a health care provider about: Any allergies you have. All medicines you are taking, including vitamins, herbs, eye drops, creams, and over-the-counter medicines. Any problems you or family members have had with anesthetic medicines. Any blood disorders you have. Any surgeries you have had. Any medical conditions you have. Whether you are pregnant or may be pregnant. What are the risks? Generally, this is a safe procedure. However, problems may occur, including: Infection. Bleeding. Blood clots in the legs or lungs. Allergic reactions to medicines. Damage to nearby structures or organs. Having to change from this surgery to one in which a large incision is made in the abdomen (abdominal hysterectomy). What happens before the procedure? Staying hydrated Follow instructions from your health care provider about hydration, which may include: Up to 2 hours before the procedure - you may continue to drink clear liquids, such as water, clear fruit juice, black coffee, and plain tea.  Eating and drinking restrictions Follow instructions from your health care provider about eating and drinking, which may include: 8 hours before the procedure - stop eating heavy meals or foods, such as meat, fried  foods, or fatty foods. 6 hours before the procedure - stop eating light meals or foods, such as toast or cereal. 6 hours before the procedure - stop drinking milk or drinks that contain milk. 2 hours before the procedure - stop drinking clear liquids. Medicines Ask your health care provider about: Changing or stopping your regular medicines. This is especially important if you are taking diabetes medicines or blood thinners. Taking medicines such as aspirin and ibuprofen. These medicines can thin your blood. Do not take these medicines unless your health care provider tells you to take them. Taking over-the-counter medicines, vitamins, herbs, and supplements. You may be asked to take medicine that helps you have a bowel movement (laxative) to prevent constipation. General instructions If you were asked to do bowel preparation before the procedure, follow instructions from your health care provider. This procedure can affect the way you feel about yourself. Talk with your health care provider about the physical and emotional changes hysterectomy may cause. Do not use any products that contain nicotine or tobacco for at least 4 weeks before the procedure. These products include cigarettes, chewing tobacco, and vaping devices, such as e-cigarettes. If you need help quitting, ask your health care provider. Plan to have a responsible adult take you home from the hospital or clinic. Plan to have a responsible adult care for you for the time you are told after you leave the hospital or clinic. This is important. Surgery safety Ask your health care provider: How your surgery site will be marked. What steps will be taken to help prevent infection. These may include: Removing hair at the surgery site. Washing skin with a germ-killing soap. Receiving antibiotic medicine. What happens during the procedure?   An IV will be inserted into one of your veins. You will be given one or more of the following: A  medicine to help you relax (sedative). A medicine to make you fall asleep (general anesthetic). A medicine to numb the area (local anesthetic). A medicine that is injected into your spine to numb the area below and slightly above the injection site (spinal anesthetic). A medicine that is injected into an area of your body to numb everything below the injection site (regional anesthetic). A gas will be used to inflate your abdomen. This will allow your surgeon to look inside your abdomen and do the surgery. Three or four small incisions will be made in your abdomen. A small device with a light (laparoscope) will be inserted into one of your incisions. Surgical instruments will be inserted through the other incisions in order to perform the procedure. Your uterus and cervix may be removed through your vagina or cut into small pieces and removed through the small incisions. Any other organs that need to be removed will also be removed this way. The gas will be released from inside your abdomen. Your incisions will be closed with stitches (sutures), skin glue, or adhesive strips. A bandage (dressing) may be placed over your incisions. The procedure may vary among health care providers and hospitals. What happens after the procedure? Your blood pressure, heart rate, breathing rate, and blood oxygen level will be monitored until you leave the hospital or clinic. You will be given medicine for pain as needed. You will be encouraged to walk as soon as possible. You will also use a device to help you breathe or do breathing exercises to keep your lungs clear. You may have to wear compression stockings. These stockings help to prevent blood clots and reduce swelling in your legs. You will need to wear a sanitary pad for vaginal discharge or bleeding. Summary Total laparoscopic hysterectomy is a procedure to remove your uterus, cervix, and sometimes the fallopian tubes and ovaries. This procedure can  affect the way you feel about yourself. Talk with your health care provider about the physical and emotional changes hysterectomy may cause. After this procedure, you will no longer be able to have a baby, and you will no longer have a menstrual period. You will be given pain medicine to control discomfort after this procedure. Plan to have a responsible adult take you home from the hospital or clinic. This information is not intended to replace advice given to you by your health care provider. Make sure you discuss any questions you have with your healthcare provider. Document Revised: 12/21/2019 Document Reviewed: 12/21/2019 Elsevier Patient Education  2022 Else[employer] Inc. Total Laparoscopic Hysterectomy, Care After The following information offers guidance on how to care for yourself after your procedure. Your health care provider may also give you more specific instructions. If you have problems or questions, contact your health careprovider. What can I expect after the procedure? After the procedure, it is common to have: Pain, bruising, and numbness around your incisions. Tiredness (fatigue). Poor appetite. Less interest in sex. Vaginal discharge or bleeding. You will need to use a sanitary pad after this procedure. Feelings of sadness or other emotions. If your ovaries were also removed, it is also common to have symptoms of menopause, such as hot flashes, night sweats, and lack of sleep (insomnia). Follow these instructions at home: Medicines Take over-the-counter and prescription medicines only as told by your health care provider. Ask your health care provider if the   medicine prescribed to you: Requires you to avoid driving or using machinery. Can cause constipation. You may need to take these actions to prevent or treat constipation: Drink enough fluid to keep your urine pale yellow. Take over-the-counter or prescription medicines. Eat foods that are high in fiber, such as beans,  whole grains, and fresh fruits and vegetables. Limit foods that are high in fat and processed sugars, such as fried or sweet foods. Incision care  Follow instructions from your health care provider about how to take care of your incisions. Make sure you: Wash your hands with soap and water for at least 20 seconds before and after you change your bandage (dressing). If soap and water are not available, use hand sanitizer. Change your dressing as told by your health care provider. Leave stitches (sutures), skin glue, or adhesive strips in place. These skin closures may need to stay in place for 2 weeks or longer. If adhesive strip edges start to loosen and curl up, you may trim the loose edges. Do not remove adhesive strips completely unless your health care provider tells you to do that. Check your incision areas every day for signs of infection. Check for: More redness, swelling, or pain. Fluid or blood. Warmth. Pus or a bad smell.  Activity  Rest as told by your health care provider. Avoid sitting for a long time without moving. Get up to take short walks every 1-2 hours. This is important to improve blood flow and breathing. Ask for help if you feel weak or unsteady. Return to your normal activities as told by your health care provider. Ask your health care provider what activities are safe for you. Do not lift anything that is heavier than 10 lb (4.5 kg), or the limit that you are told, for one month after surgery or until your health care provider says that it is safe. If you were given a sedative during the procedure, it can affect you for several hours. Do not drive or operate machinery until your health care provider says that it is safe.  Lifestyle Do not use any products that contain nicotine or tobacco. These products include cigarettes, chewing tobacco, and vaping devices, such as e-cigarettes. These can delay healing after surgery. If you need help quitting, ask your health care  provider. Do not drink alcohol until your health care provider approves. General instructions  Do not douche, use tampons, or have sex for at least 6 weeks, or as told by your health care provider. If you struggle with physical or emotional changes after your procedure, speak with your health care provider or a therapist. Do not take baths, swim, or use a hot tub until your health care provider approves. You may only be allowed to take showers for 2-3 weeks. Keep your dressing dry until your health care provider says it can be removed. Try to have someone at home with you for the first 1-2 weeks to help with your daily chores. Wear compression stockings as told by your health care provider. These stockings help to prevent blood clots and reduce swelling in your legs. Keep all follow-up visits. This is important.  Contact a health care provider if: You have any of these signs of infection: Chills or a fever. More redness, swelling, or pain around an incision. Fluid or blood coming from an incision. Warmth coming from an incision. Pus or a bad smell coming from an incision. An incision opens. You feel dizzy or light-headed. You have pain or bleeding   when you urinate, or you are unable to urinate. You have abnormal vaginal discharge. You have pain that does not get better with medicine. Get help right away if: You have a fever and your symptoms suddenly get worse. You have severe abdominal pain. You have chest pain or shortness of breath. You faint. You have pain, swelling, or redness in your leg. You have heavy vaginal bleeding with blood clots, soaking through a sanitary pad in less than 1 hour. These symptoms may represent a serious problem that is an emergency. Do not wait to see if the symptoms will go away. Get medical help right away. Call your local emergency services (911 in the [place] Do not drive yourself to the hospital. Summary After the procedure, it is common to have pain  and bruising around your incisions. Do not take baths, swim, or use a hot tub until your health care provider approves. Do not lift anything that is heavier than 10 lb (4.5 kg), or the limit that you are told, for one month after surgery or until your health care provider says that it is safe. Tell your health care provider if you have any signs or symptoms of infection after the procedure. Get help right away if you have severe abdominal pain, chest pain, shortness of breath, or heavy bleeding from your vagina. This information is not intended to replace advice given to you by your health care provider. Make sure you discuss any questions you have with your healthcare provider. Document Revised: 12/21/2019 Document Reviewed: 12/21/2019 Elsevier Patient Education  2022 Elsevier Inc.  

## 2020-11-12 NOTE — Progress Notes (Signed)
History:  55 y.o. G1P1 here today for eval of PMPB. Pt has prev declined an endo bx. Pt agrees to have the endo bx if she can have anesthesia. Agrees to proceed following paracervical nerve block. Pt wants definitive tx of the fibroids as she is tired of the constant bleeding. The Megace is no longer working.  She is requesting definitive management with hysterectomy.     The following portions of the patient's history were reviewed and updated as appropriate: allergies, current medications, past family history, past medical history, past social history, past surgical history and problem list.  Review of Systems:  Pertinent items are noted in HPI.    Objective:  Physical Exam Blood pressure 128/74, pulse 84, weight 298 lb (135.2 kg).  CONSTITUTIONAL: Well-developed, well-nourished female in no acute distress.  HENT:  Normocephalic, atraumatic EYES: Conjunctivae and EOM are normal. No scleral icterus.  NECK: Normal range of motion SKIN: Skin is warm and dry. No rash noted. Not diaphoretic.No pallor. [place]: Alert and oriented to person, place, and time. Normal coordination.  Abd: Soft, nontender and nondistended; obese, NT, ND Pelvic: Normal appearing external genitalia; normal appearing vaginal mucosa and cervix.  Normal discharge.  Uterus: size difficult to palpate due to pts body habitus. No adnexal tenderness   The indications for endometrial biopsy were reviewed.   Risks of the biopsy including cramping, bleeding, infection, uterine perforation, inadequate specimen and need for additional procedures  were discussed. The patient states she understands and agrees to undergo procedure today. Consent was signed. Time out was performed. Urine HCG was negative. A sterile speculum was placed in the patient's vagina and the cervix was prepped with Betadine. A single-toothed tenaculum was placed on the anterior lip of the cervix to stabilize it. The 3 mm pipelle was introduced into the  endometrial cavity without difficulty to a depth of 13cm, and a moderate amount of tissue was obtained and sent to pathology. The instruments were removed from the patient's vagina. Minimal bleeding from the cervix was noted. The patient tolerated the procedure well.    Assessment & Plan:  PMPB s/p endo bx following paracervical nerve block. Pt tol well.  Routine post-procedure instructions were given to the patient. The patient will follow up to review the results and for further management.        Patient desires surgical management with [facility] with bilateral salpingectomy   The risks of surgery were discussed in detail with the patient including but not limited to: bleeding which may require transfusion or reoperation; infection which may require prolonged hospitalization or re-hospitalization and antibiotic therapy; injury to bowel, bladder, ureters and major vessels or other surrounding organs; need for additional procedures including laparotomy; thromboembolic phenomenon, incisional problems and other postoperative or anesthesia complications.  Patient was told that the likelihood that her condition and symptoms will be treated effectively with this surgical management was very high; the postoperative expectations were also discussed in detail. The patient also understands the alternative treatment options which were discussed in full. All questions were answered.  She was told that she will be contacted by our surgical scheduler regarding the time and date of her surgery; routine preoperative instructions of having nothing to eat or drink after midnight on the day prior to surgery and also coming to the hospital 1 1/2 hours prior to her time of surgery were also emphasized.  She was told she may be called for a preoperative appointment about a week prior to surgery and will be given  further preoperative instructions at that visit. Printed patient education handouts about the procedure were given to the  patient to review at home.   Avice Funchess L. Harraway-Smith, M.D., FACOG   Addendum: I received a note from Dr. Gwenlyn Found: "I have no specific issues regarding her laparoscopic hysterectomy from a cardiology point of view.  She is on oral anticoagulation for her peripheral vascular disease which would not need to be stopped prior to her surgery."   JJB     Zorion Nims L. Harraway-Smith, M.D., [place]

## 2020-11-13 ENCOUNTER — Other Ambulatory Visit: Payer: Self-pay

## 2020-11-13 ENCOUNTER — Telehealth (INDEPENDENT_AMBULATORY_CARE_PROVIDER_SITE_OTHER): Payer: BC Managed Care – PPO | Admitting: Rheumatology

## 2020-11-13 ENCOUNTER — Encounter: Payer: Self-pay | Admitting: Rheumatology

## 2020-11-13 VITALS — Ht 65.0 in

## 2020-11-13 DIAGNOSIS — R768 Other specified abnormal immunological findings in serum: Secondary | ICD-10-CM | POA: Diagnosis not present

## 2020-11-13 DIAGNOSIS — R252 Cramp and spasm: Secondary | ICD-10-CM

## 2020-11-13 DIAGNOSIS — R200 Anesthesia of skin: Secondary | ICD-10-CM

## 2020-11-13 DIAGNOSIS — M1711 Unilateral primary osteoarthritis, right knee: Secondary | ICD-10-CM | POA: Diagnosis not present

## 2020-11-13 DIAGNOSIS — T81718A Complication of other artery following a procedure, not elsewhere classified, initial encounter: Secondary | ICD-10-CM

## 2020-11-13 DIAGNOSIS — Z9889 Other specified postprocedural states: Secondary | ICD-10-CM

## 2020-11-13 DIAGNOSIS — E559 Vitamin D deficiency, unspecified: Secondary | ICD-10-CM

## 2020-11-13 DIAGNOSIS — R700 Elevated erythrocyte sedimentation rate: Secondary | ICD-10-CM | POA: Diagnosis not present

## 2020-11-13 DIAGNOSIS — I739 Peripheral vascular disease, unspecified: Secondary | ICD-10-CM

## 2020-11-13 DIAGNOSIS — R202 Paresthesia of skin: Secondary | ICD-10-CM

## 2020-11-13 DIAGNOSIS — R778 Other specified abnormalities of plasma proteins: Secondary | ICD-10-CM

## 2020-11-13 DIAGNOSIS — M5030 Other cervical disc degeneration, unspecified cervical region: Secondary | ICD-10-CM | POA: Diagnosis not present

## 2020-11-13 DIAGNOSIS — I729 Aneurysm of unspecified site: Secondary | ICD-10-CM

## 2020-11-13 DIAGNOSIS — R7303 Prediabetes: Secondary | ICD-10-CM

## 2020-11-13 DIAGNOSIS — I10 Essential (primary) hypertension: Secondary | ICD-10-CM

## 2020-11-13 DIAGNOSIS — I70229 Atherosclerosis of native arteries of extremities with rest pain, unspecified extremity: Secondary | ICD-10-CM

## 2020-11-13 DIAGNOSIS — D472 Monoclonal gammopathy: Secondary | ICD-10-CM

## 2020-11-13 NOTE — Progress Notes (Signed)
Virtual Visit via Telephone Note  I connected with Christina [name] on 11/13/20 at  3:45 PM EDT by telephone and verified that I am speaking with the correct person using two identifiers.  Location: Patient: Home  Provider: Clinic  This service was conducted via virtual visit.  The patient was located at home. I was located in my office.  Consent was obtained prior to the virtual visit and is aware of possible charges through their insurance for this visit.  The patient is an established patient.  Dr. Estanislado Pandy, MD conducted the virtual visit and [name], PA-C acted as scribe during the service.  Office staff helped with scheduling follow up visits after the service was conducted.     I discussed the limitations, risks, security and privacy concerns of performing an evaluation and management service by telephone and the availability of in person appointments. I also discussed with the patient that there may be a patient responsible charge related to this service. The patient expressed understanding and agreed to proceed.  CC: Discuss AVISE results  History of Present Illness: Patient is a 55 year old female with a past medical history of positive ANA and positive rheumatoid factor.  The virtual visit today was scheduled to discuss AVISE results. She states her body is going through "a lot of changes."  She states she has been having increased muscle cramps and tingling in her hands and feet.  She states the numbness and tingling in her hands and feet usually occurs at night.  The symptoms in her feet have been intermittent. 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 She has ongoing fatigue secondary to insomnia. She denies any joint pain or joint swelling.  She has not noticed any rashes, hair loss, photosensitivity, raynaud's phenomenon, oral or nasal ulcers, or sicca symptoms.  She denies any SOB, palpitations, or chest pain.     Patient reports morning stiffness for 0  none .   Patient  denies nocturnal pain.  Difficulty dressing/grooming: Denies Difficulty climbing stairs: Denies Difficulty getting out of chair: Denies Difficulty using hands for taps, buttons, cutlery, and/or writing: Reports   Review of Systems  Constitutional:  Positive for malaise/fatigue.  HENT:  Negative for congestion.   Eyes:  Negative for pain.  Respiratory:  Negative for shortness of breath.   Cardiovascular:  Negative for leg swelling.  Gastrointestinal:  Negative for constipation.  Genitourinary:  Negative for frequency.  Musculoskeletal:  Negative for joint pain.  Skin:  Negative for rash.  Neurological:  Negative for weakness.  Endo/Heme/Allergies:  Does not bruise/bleed easily.  Psychiatric/Behavioral:  The patient has insomnia.     Observations/Objective: Physical Exam Neurological:     Mental Status: She is alert and oriented to person, place, and time.  Psychiatric:        Mood and Affect: Mood and affect normal.        Cognition and Memory: Memory normal.        Judgment: Judgment normal.     Assessment and Plan: Visit Diagnoses: Positive ANA (antinuclear antibody) - 07/07/19: AVISE negative -2.0 ANA 1:160H, +RF IgM 9.2.   03/14/20: AVISE index -2.0.  ANA negative.  RF IgM 13.0. She updated AVISE labs on 10/31/20: Negative index: -1.0.  ANA 1:320 speckled, +RF IgM-12.0.  Discussed these results with the patient today in detail and all questions were addressed. Discussed that the ANA can be positive in up to 15% of patients in the general population. A  copy of the AVISE results will be sent to the patient for her records.  She has no signs or symptoms of systemic lupus or rheumatoid arthritis at this time. She is not experiencing any joint pain, joint swelling, or morning stiffness at this time. She has not had any oral or nasal ulcers, sicca symptoms, raynaud's, SOB, chest pain, palpitations, swollen lymph nodes, hair loss, or rashes recently. She was advised to notify us if she  develops any new or worsening symptoms.  She will follow up in 1 year.   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  Upcoming appointment on 11/27/20.     MGUS (monoclonal gammopathy of unknown significance): She underwent a thorough workup with hem/onc and was diagnosed with MGUS.  She has an upcoming routine appointment with her hematologist on 11/27/20.    Numbness and tingling of upper and [name] extremities of both sides: She has started to have recurrence of these symptoms in her hands on a nightly basis and occasional symptoms in her feet.  She was previously evaluated by Dr. Krista Blue.  She underwent NCV with EMG which was normal on 09/12/19. Encouraged to try using a carpal tunnel brace at night. She can schedule an appointment with Dr. Krista Blue if her symptoms persist or worsen.   Muscle cramps: She has been having more frequent and severe muscle cramps recently.  Discussed the use of magnesium malate at bedtime.  Discussed possible side effects of drowsiness and diarrhea.  She was also encouraged to stretch on a daily basis.    Primary osteoarthritis of right knee-She is not having any discomfort in her right knee at this time.   DDD (degenerative disc disease), cervical-She is not having any discomfort in her neck at this time.    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    I discussed the assessment and treatment plan with the patient. The patient was provided an opportunity to ask questions and all were answered. The patient agreed with the plan and demonstrated an understanding of the instructions.   The patient was advised to call back or seek an in-person evaluation if  the symptoms worsen or if the condition fails to improve as anticipated.  I provided 25 minutes of non-face-to-face time during this encounter.  Scribed by [name], PA-C  I reviewed the above note and I attest to the accuracy of the documentation.  Bo Merino, MD

## 2020-11-14 LAB — SURGICAL PATHOLOGY

## 2020-11-18 ENCOUNTER — Telehealth: Payer: Self-pay

## 2020-11-18 ENCOUNTER — Encounter: Payer: Self-pay | Admitting: General Practice

## 2020-11-18 NOTE — Telephone Encounter (Signed)
-----   Message from Lavonia Drafts, MD sent at 11/18/2020  9:17 AM EDT ----- Please  call pt. Her Endo bx was neg. I will schedule her RATH.   Thx,  Clh-S

## 2020-11-18 NOTE — Telephone Encounter (Signed)
Called pt to discuss Endo Bx results. Pt made aware that her Endo Bx results are negative and her Rafael Hernandez will be scheduled. Understanding was voiced. Zebulin Siegel l Tanzie Rothschild, CMA

## 2020-11-20 IMAGING — CT CT ABD-PELV W/O
2 of 4 series · 16 of 46 positions shown, 18 images · non-contrast
Comparison: None.

CLINICAL DATA: Left groin pain. Hematoma after angiography
yesterday. Positive left groin pseudo aneurysm.

EXAM:
CT ABDOMEN AND PELVIS WITHOUT CONTRAST
TECHNIQUE: Multidetector CT imaging of the abdomen and pelvis was performed
following the standard protocol without IV contrast.

[Series 3: ap without · axial · non-contrast · 0.93mm/px · z∈[+770,+1276]mm · 13 of 115 slices shown, 15 images]
[im 7/115  soft-tissue]
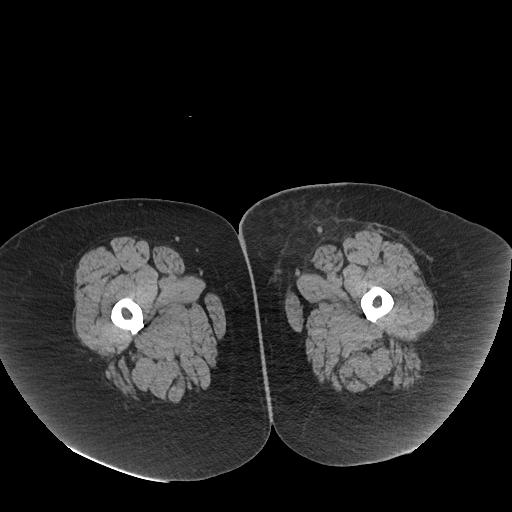
[im 7/115  bone]
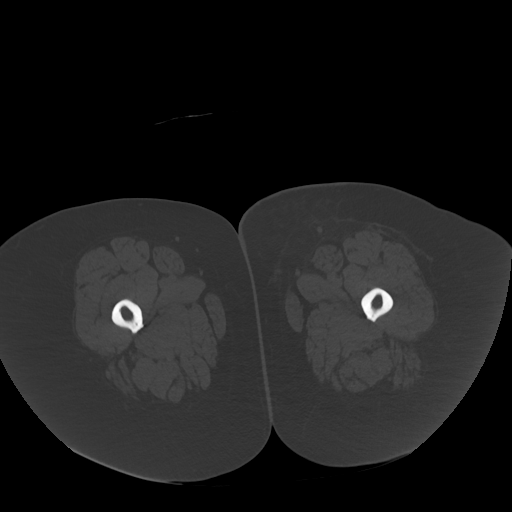
[im 13/115  soft-tissue]
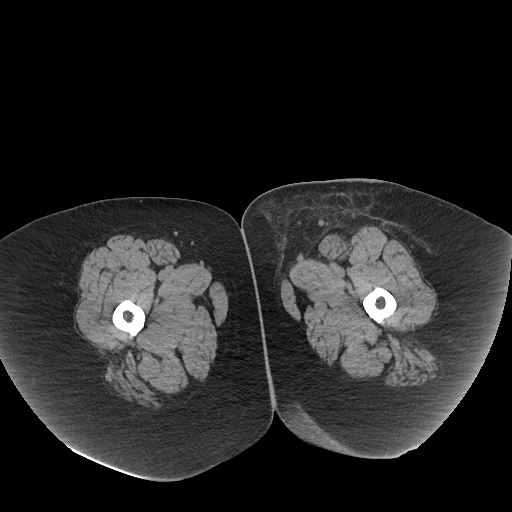
[im 26/115  soft-tissue]
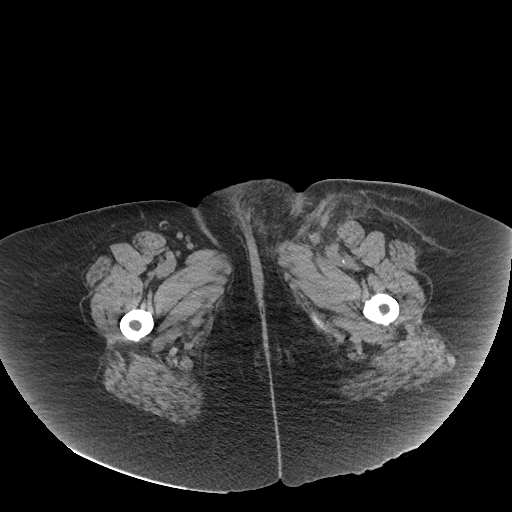
[im 32/115  soft-tissue]
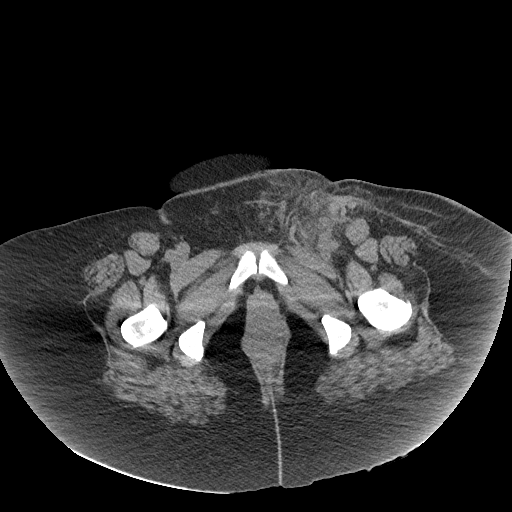
[im 39/115  soft-tissue]
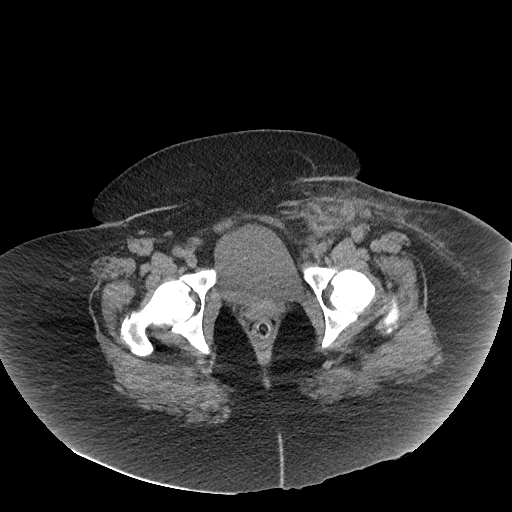
[im 51/115  soft-tissue]
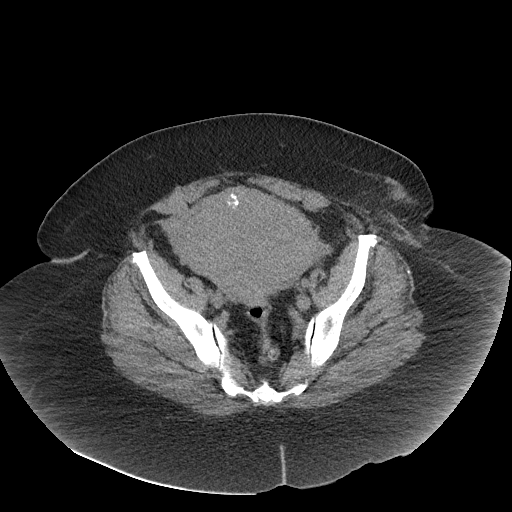
[im 58/115  soft-tissue]
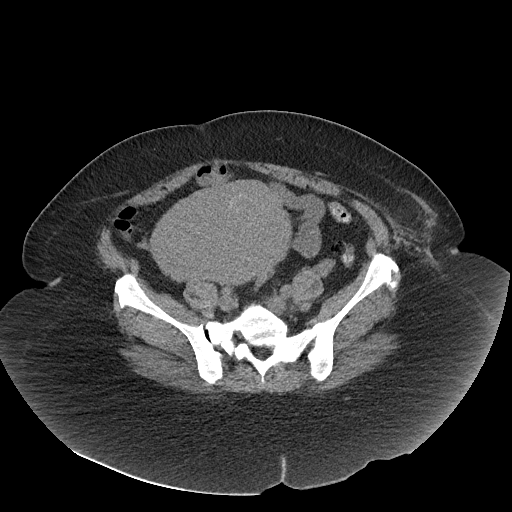
[im 64/115  soft-tissue]
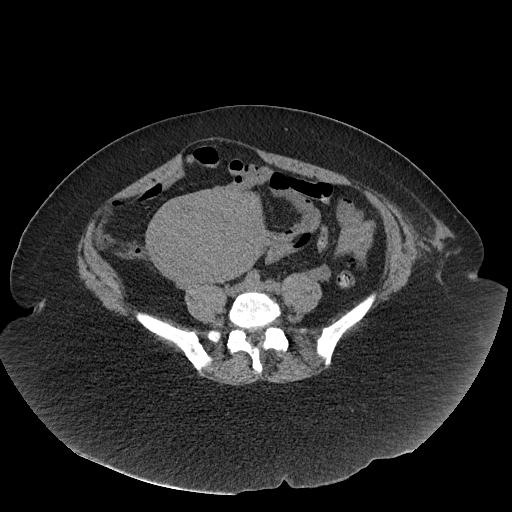
[im 77/115  soft-tissue]
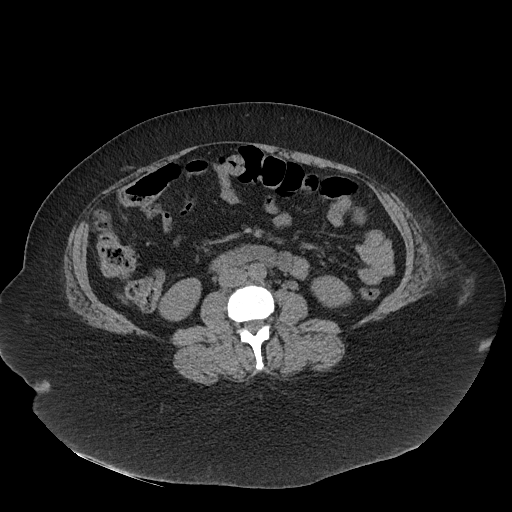
[im 77/115  bone]
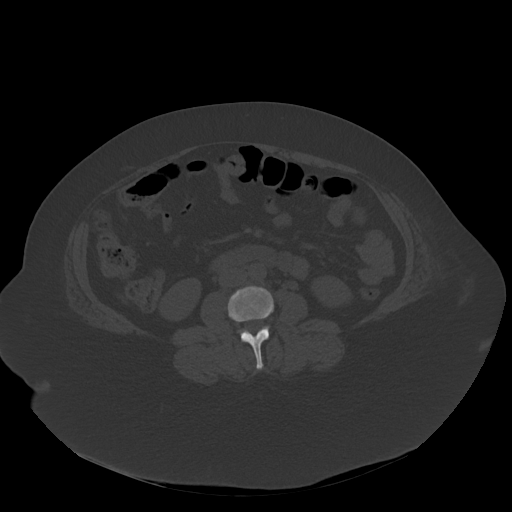
[im 83/115  soft-tissue]
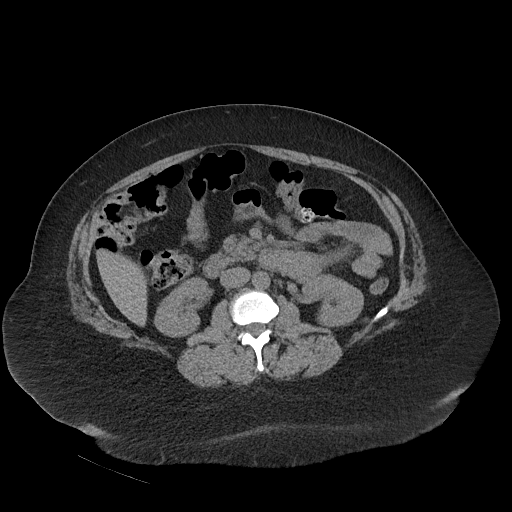
[im 89/115  soft-tissue]
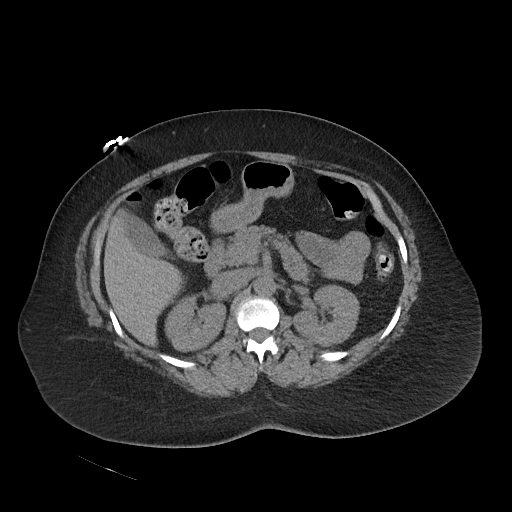
[im 102/115  soft-tissue]
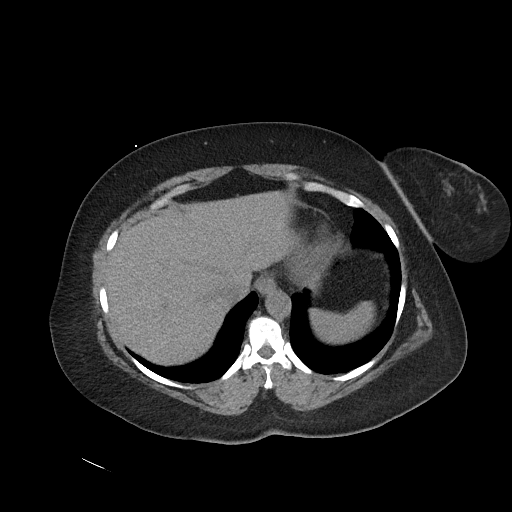
[im 108/115  soft-tissue]
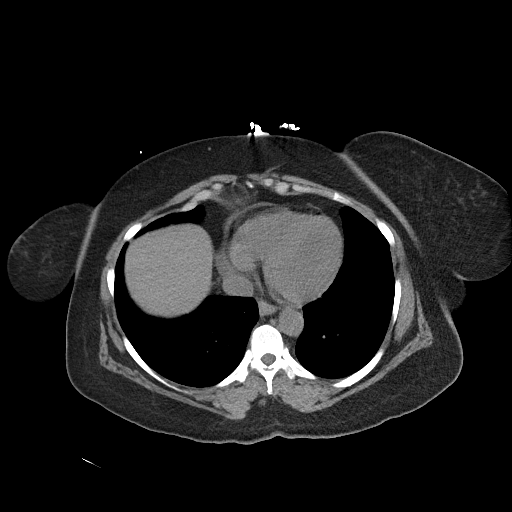

[Series 6: cor · coronal · 0.95mm/px · 3 of 114 slices shown]
[im 38/114  soft-tissue]
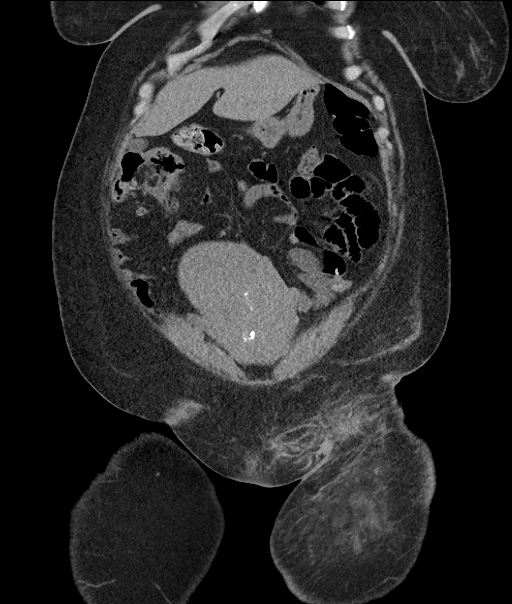
[im 51/114  soft-tissue]
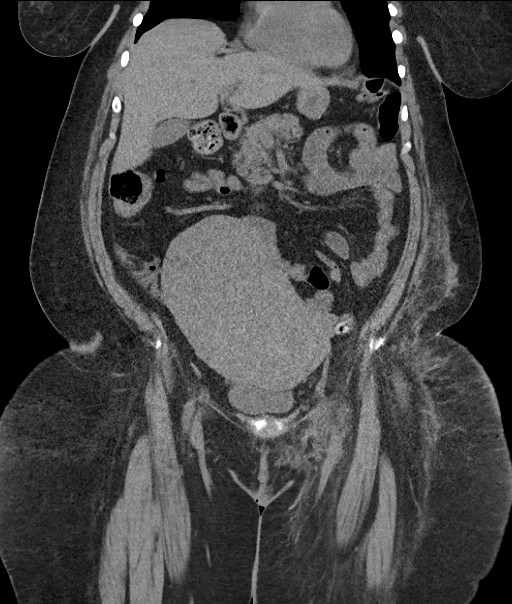
[im 63/114  soft-tissue]
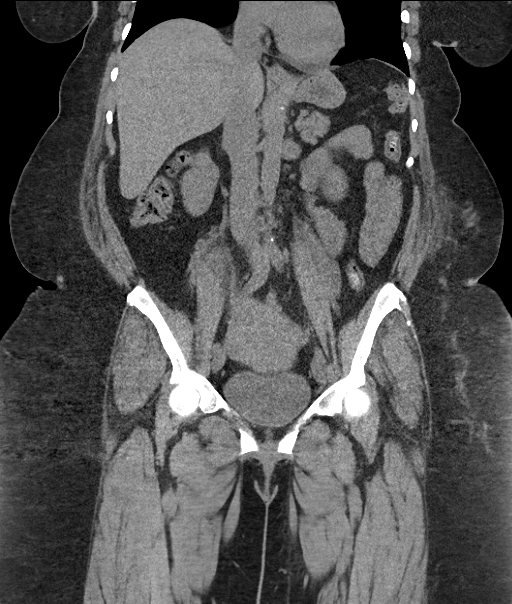

[16 of 46 positions shown; findings below may reference images not displayed]

FINDINGS: Lower chest: Unremarkable

Hepatobiliary: No focal abnormality in the liver on this study
without intravenous contrast. There is no evidence for gallstones,
gallbladder wall thickening, or pericholecystic fluid. No
intrahepatic or extrahepatic biliary dilation.

Pancreas: No focal mass lesion. No dilatation of the main duct. No
intraparenchymal cyst. No peripancreatic edema.

Spleen: No splenomegaly. No focal mass lesion.

Adrenals/Urinary Tract: No adrenal nodule or mass. Scarring noted in
the left kidney. Right kidney unremarkable on this noncontrast exam.
No evidence for hydroureter. The urinary bladder appears normal for
the degree of distention.

Stomach/Bowel: Stomach is nondistended. No gastric wall thickening.
No evidence of outlet obstruction. Duodenum is normally positioned
as is the ligament of Treitz. No small bowel wall thickening. No
small bowel dilatation. The terminal ileum is normal. The appendix
is normal. No gross colonic mass. No colonic wall thickening.

Vascular/Lymphatic: No abdominal aortic aneurysm. There is no
gastrohepatic or hepatoduodenal ligament lymphadenopathy. No
intraperitoneal or retroperitoneal lymphadenopathy.

Reproductive: Uterus is markedly enlarged, measuring 16.7 x 9.8 x
14.1 cm. Multiple uterine fibroids are evident. There is no axillary
lymphadenopathy.

Other: No intraperitoneal free fluid.

Musculoskeletal: Hemorrhage and edema is identified in the left
groin. 4.1 x 2.3 x 2.4 cm area of relatively confluent soft tissue
attenuation in the left groin may represent a hematoma or the
patient's reported known pseudoaneurysm. There is no extension of
the hemorrhage/edema up into the left pelvic sidewall or
retroperitoneal space of the abdomen. No worrisome lytic or
sclerotic osseous abnormality.
IMPRESSION: 1. Edema/hemorrhage identified in the left groin region. 4.1 x 2.3 x
2.4 cm area of relatively confluent soft tissue attenuation in the
left groin may represent a hematoma or the patient's reported known
pseudoaneurysm. There is no extension of the hemorrhage/edema up
into the left pelvic sidewall or retroperitoneal space.
2. Markedly enlarged uterus with uterine fibroids.

## 2020-11-21 ENCOUNTER — Encounter: Payer: Self-pay | Admitting: Rheumatology

## 2020-11-24 ENCOUNTER — Encounter: Payer: Self-pay | Admitting: Gastroenterology

## 2020-11-28 ENCOUNTER — Encounter: Payer: Self-pay | Admitting: Rheumatology

## 2020-12-01 NOTE — Telephone Encounter (Signed)
She is taking most of the recommended supplements.  I do not have any other suggestions.  CK (muscle enzyme test) was normal in 2020.  We can repeat CK if she would like to get tested.  Please advise referral to neurology.  She may get referral to her PCP or we can refer her.

## 2020-12-02 ENCOUNTER — Other Ambulatory Visit: Payer: Self-pay | Admitting: *Deleted

## 2020-12-02 DIAGNOSIS — R252 Cramp and spasm: Secondary | ICD-10-CM

## 2020-12-02 NOTE — Telephone Encounter (Signed)
I called patient, referral placed for neurology.

## 2020-12-10 ENCOUNTER — Telehealth: Payer: Self-pay | Admitting: *Deleted

## 2020-12-10 ENCOUNTER — Telehealth: Payer: Self-pay | Admitting: Nurse Practitioner

## 2020-12-10 NOTE — Telephone Encounter (Signed)
Inbound call from patient. Requesting a call back to know if she will need an appt to get her weight checked to determine if colonoscopy needs to be at [facility] or [facility].

## 2020-12-10 NOTE — Telephone Encounter (Signed)
Message received to contact patient regarding reschedule surgery. Call to patient. Advised next available date is November 1. Patient is agreeable and desires this change. Surgery instructions reviewed. Reports she is currently on Eliquis for arterial issues in legs. Advised will follow-up with medical clearance and medication instructions. Advised pre-op nurse will call with additional information.

## 2020-12-11 ENCOUNTER — Encounter: Payer: Self-pay | Admitting: *Deleted

## 2020-12-11 NOTE — Telephone Encounter (Signed)
If the patient calls back, she does need an appointment for the weight check. Use the BP check and put in the appointment notes "weight check."  She will be a procedure for Dr Havery Moros.

## 2020-12-11 NOTE — Telephone Encounter (Signed)
Attempted to contacted patient, no answer went to voicemail. Left voicemail to have patient return call.

## 2020-12-12 ENCOUNTER — Other Ambulatory Visit: Payer: Self-pay

## 2020-12-12 ENCOUNTER — Other Ambulatory Visit: Payer: Self-pay | Admitting: *Deleted

## 2020-12-12 DIAGNOSIS — D219 Benign neoplasm of connective and other soft tissue, unspecified: Secondary | ICD-10-CM

## 2020-12-12 DIAGNOSIS — N939 Abnormal uterine and vaginal bleeding, unspecified: Secondary | ICD-10-CM

## 2020-12-15 ENCOUNTER — Other Ambulatory Visit: Payer: Self-pay

## 2020-12-15 ENCOUNTER — Ambulatory Visit (HOSPITAL_BASED_OUTPATIENT_CLINIC_OR_DEPARTMENT_OTHER): Payer: BC Managed Care – PPO

## 2020-12-15 ENCOUNTER — Encounter (HOSPITAL_BASED_OUTPATIENT_CLINIC_OR_DEPARTMENT_OTHER): Payer: Self-pay

## 2020-12-15 ENCOUNTER — Ambulatory Visit (HOSPITAL_BASED_OUTPATIENT_CLINIC_OR_DEPARTMENT_OTHER)
Admission: RE | Admit: 2020-12-15 | Discharge: 2020-12-15 | Disposition: A | Discharge status: Home / Self Care | Payer: BC Managed Care – PPO | Source: Ambulatory Visit | Attending: General Practice | Admitting: General Practice

## 2020-12-15 ENCOUNTER — Other Ambulatory Visit: Payer: Self-pay | Admitting: Obstetrics & Gynecology

## 2020-12-15 DIAGNOSIS — Z1231 Encounter for screening mammogram for malignant neoplasm of breast: Secondary | ICD-10-CM | POA: Diagnosis present

## 2020-12-15 MED ORDER — MEGESTROL ACETATE 40 MG PO TABS: 40.0000 mg | ORAL_TABLET | Freq: Every day | ORAL | 3 refills | Status: DC

## 2020-12-15 NOTE — Telephone Encounter (Signed)
Filled via the patient refill request. Kathrene Alu RN

## 2020-12-15 NOTE — Telephone Encounter (Signed)
Patient scheduled 8/24 for BP check at 8:05. Patient is aware.

## 2020-12-24 ENCOUNTER — Ambulatory Visit: Payer: BC Managed Care – PPO | Admitting: Nurse Practitioner

## 2020-12-24 VITALS — Wt 296.3 lb

## 2020-12-24 DIAGNOSIS — Z1211 Encounter for screening for malignant neoplasm of colon: Secondary | ICD-10-CM

## 2020-12-29 ENCOUNTER — Other Ambulatory Visit: Payer: Self-pay

## 2020-12-29 NOTE — Progress Notes (Signed)
Patient came on Thursday, 8-25 for weight check.  Weight = 296.3

## 2020-12-30 ENCOUNTER — Telehealth: Payer: Self-pay | Admitting: Nurse Practitioner

## 2020-12-30 NOTE — Telephone Encounter (Signed)
-----   Message from Christina Flock, MD sent at 12/30/2020  7:29 AM EDT ----- Thanks Christina [name], I agree she needs a clinic visit to sort this out. We need to assess her functional status and if she would be able to hold anticoagulation for a procedure. If she can't right now, could consider a virtual colonoscopy to ensure no high risk lesions and could do a colonoscopy once she is in a better position to do so. But if her vascular issues are stable and they can safely hold anticoagulation and no active or significant cardiopulmonary issues, could do her case at [facility] if BMI < 50. Let's get her an office visit and then we can decide.   Thanks   ----- Message ----- From: Christina Pick, NP Sent: 12/29/2020   8:06 AM EDT To: Christina Flock, MD  Dr. Havery Moros, patient was seen in office 04/2020 to schedule a colonoscopy but BMI was 50 and patient did not want to have procedure at the hospital and was suppose to return to the office for a weight recheck in a few weeks following her appt which was not done.  She came in last week, 8 months later to have her weight rechecked and BMI is 49.3. She is is on Plavix and Eliquis. (History of peripheral arterial disease s/p right thrombectomy with angioplasty and stent placement extending from the popliteal to anterior tibial artery 05/2018 and s/p right popliteal angioplasty for in-stent stenosis 07/12/2019 on Plavix and Eliquis)  She is scheduled for a hysterectomy surgery 03/2021  I think she needs an update office visit since so much time has passed, update Plavix and Eliquis clearance and should still be scheduled at the hospital since her BMI is barely below 50.  What are your recommendations regarding scheduling her colonoscopy?    ----- Message ----- From: Audrea Muscat, CMA Sent: 12/24/2020  11:21 AM EDT To: Christina Pick, NP  Patient came in for a weight check today:  296.3 lb

## 2020-12-30 NOTE — Telephone Encounter (Signed)
Spoke with patient. Patient scheduled with Dr Havery Moros 02/19/21 at 09:50am. Patient verbalized understanding, nothing further needed at this time.

## 2020-12-30 NOTE — Telephone Encounter (Signed)
Christina [name], pls call the patient and schedule her for an office follow up appointment with me or Dr. Havery Moros to further discuss scheduling a colonoscopy since it has been more than 8 months since she was seen in office. Thx

## 2021-01-13 ENCOUNTER — Other Ambulatory Visit: Payer: Self-pay | Admitting: Cardiovascular Disease

## 2021-01-13 NOTE — Telephone Encounter (Signed)
Prescription refill request for Eliquis received. Last office visit: berry 11/07/20 Scr:0.85 04/04/20 Age: 62fWeight:133.5kg

## 2021-01-19 ENCOUNTER — Telehealth: Payer: Self-pay | Admitting: *Deleted

## 2021-01-19 NOTE — Telephone Encounter (Signed)
Call to patient regarding upcoming surgery on 11-1. Has cardio appt on 12-13 but will need surgery clearance. Advised to check to see if earlier appointment available for surgery clearance instead of follow-up. Patient asked about December dates for surgery. Advised 12-6 is last date in December. Patient will calll me back or send My Chart message. Marland Kitchen

## 2021-01-31 ENCOUNTER — Other Ambulatory Visit: Payer: Self-pay | Admitting: Cardiovascular Disease

## 2021-02-02 MED ORDER — IRBESARTAN 150 MG PO TABS: 150.0000 mg | ORAL_TABLET | Freq: Every day | ORAL | 2 refills | Status: DC

## 2021-02-02 NOTE — Telephone Encounter (Signed)
Call to patient- left message calling to follow-up on plans for March 03, 2021. Request call back at earliest convenience. Cardiology 6 month follow-up remains scheduled for 04-14-21.  Will send My Chart message.

## 2021-02-05 ENCOUNTER — Encounter: Payer: Self-pay | Admitting: *Deleted

## 2021-02-05 ENCOUNTER — Telehealth: Payer: Self-pay

## 2021-02-05 NOTE — Telephone Encounter (Signed)
Voice mail message received from yesterday afternoon regarding standard surgical clearance form. In absence of form, letter sent to Dr Gwenlyn Found advising of surgery date, time, procedure, surgeon and requesting clearance and medication instructions.

## 2021-02-05 NOTE — Telephone Encounter (Signed)
Patient called and would like to cancel her surgery scheduled for Nov1 - hysterectomy(RATH)  Patient states she will call back and reschedule at another time. Kathrene Alu RN

## 2021-02-09 ENCOUNTER — Other Ambulatory Visit: Payer: Self-pay

## 2021-02-09 DIAGNOSIS — N939 Abnormal uterine and vaginal bleeding, unspecified: Secondary | ICD-10-CM

## 2021-02-09 DIAGNOSIS — D219 Benign neoplasm of connective and other soft tissue, unspecified: Secondary | ICD-10-CM

## 2021-02-09 MED ORDER — MEGESTROL ACETATE 40 MG PO TABS: 40.0000 mg | ORAL_TABLET | Freq: Every day | ORAL | 3 refills | Status: DC

## 2021-02-09 NOTE — Telephone Encounter (Signed)
Patient called stating that Vladimir Faster said she didn't have any more refills on her Megace. Sent in refills but did inform patient the refills had been given on August 2022 with 3 additional refills. Kathrene Alu RN

## 2021-02-11 NOTE — Telephone Encounter (Signed)
PT AWARE OF RECOMMENDATIONS AND APPT HAS BEEN MADE WITH SCOTT [facility]ER [facility] FOR 03/09/21 AT 8:45 AM

## 2021-02-12 ENCOUNTER — Other Ambulatory Visit: Payer: Self-pay | Admitting: Internal Medicine

## 2021-02-12 ENCOUNTER — Other Ambulatory Visit: Payer: Self-pay | Admitting: Cardiovascular Disease

## 2021-02-12 DIAGNOSIS — N644 Mastodynia: Secondary | ICD-10-CM

## 2021-02-12 MED ORDER — CARVEDILOL 6.25 MG PO TABS: 6.2500 mg | ORAL_TABLET | Freq: Two times a day (BID) | ORAL | 0 refills | Status: DC

## 2021-02-18 ENCOUNTER — Telehealth: Payer: Self-pay

## 2021-02-18 NOTE — Telephone Encounter (Signed)
Contacted pt to inquire on her decision to proceed with the right above-knee to tibial bypass as recommended per last office visit on 10/14/20. Pt declined to have surgery with no intentions to schedule at a later time.

## 2021-02-19 ENCOUNTER — Ambulatory Visit: Payer: BC Managed Care – PPO | Admitting: Gastroenterology

## 2021-02-27 ENCOUNTER — Inpatient Hospital Stay (HOSPITAL_COMMUNITY): Admission: RE | Admit: 2021-02-27 | Payer: BC Managed Care – PPO | Source: Ambulatory Visit

## 2021-03-03 ENCOUNTER — Ambulatory Visit (HOSPITAL_BASED_OUTPATIENT_CLINIC_OR_DEPARTMENT_OTHER): Admit: 2021-03-03 | Payer: BC Managed Care – PPO | Admitting: Obstetrics & Gynecology

## 2021-03-03 ENCOUNTER — Encounter (HOSPITAL_BASED_OUTPATIENT_CLINIC_OR_DEPARTMENT_OTHER): Payer: Self-pay

## 2021-03-03 SURGERY — HYSTERECTOMY, TOTAL, ROBOT-ASSISTED
Anesthesia: Choice | Laterality: Bilateral

## 2021-03-08 DIAGNOSIS — I2729 Other secondary pulmonary hypertension: Secondary | ICD-10-CM | POA: Insufficient documentation

## 2021-03-08 DIAGNOSIS — I5032 Chronic diastolic (congestive) heart failure: Secondary | ICD-10-CM | POA: Insufficient documentation

## 2021-03-08 DIAGNOSIS — I3139 Other pericardial effusion (noninflammatory): Secondary | ICD-10-CM | POA: Insufficient documentation

## 2021-03-08 DIAGNOSIS — D6859 Other primary thrombophilia: Secondary | ICD-10-CM | POA: Insufficient documentation

## 2021-03-08 DIAGNOSIS — I700 Atherosclerosis of aorta: Secondary | ICD-10-CM | POA: Insufficient documentation

## 2021-03-08 NOTE — Progress Notes (Signed)
Cardiology Office Note:    Date:  03/09/2021   ID:  Christina [name], DOB 09-18-1965, MRN [id number]  PCP:  Merryl Hacker No   [facility] Providers Cardiologist:  Mertie Moores, MD    Referring MD: No ref. provider found   Chief Complaint:  Follow-up for CHF, hypertension    Patient Profile:   Christina [name] is a 55 y.o. female with:  Chest pain CCTA in 2018: Normal History of pericarditis 11/2018 (HFpEF) heart failure with preserved ejection fraction  Peripheral arterial disease S/p penumbra asp thrombectomy, overlapping stents x2 to the R popliteal artery in 05/2018 S/p PTA of the R anterior tibial and popliteal arteries 07/2019 Hypercoagulable state Concern for thromboembolic phenomenon leading to occlusion of popliteal artery in 05/2018 Chronic anticoagulation with Apixaban Hypertension Hyperlipidemia Prediabetes GERD MGUS Dilated pulmonary artery on CT in the past CT negative for pulmonary emboli Morbid obesity Anemia Aortic atherosclerosis  History of Present Illness: Christina [name] was last seen by Dr. Acie Fredrickson in 05/2019.  She has seen Coletta Memos, NP several times since at the [facility].  She was last seen by Dr. Alvester Chou in July 2022 with recurrent claudication on the right.  Her Dopplers revealed a decline in her right ABI and moderately increased velocities within her stent.  She was referred to vascular surgery (Dr. Carlis Abbott) who offered fem-tib bypass grafting.  She was contemplating proceeding with vascular surgery versus percutaneous intervention with Dr. Gwenlyn Found.  She was seen recently with primary care for lower extremity edema.  She had had a recent echocardiogram with mild pulm hypertension.  She was started on furosemide.  She returns for follow-up.  She is here alone.  Overall, she is doing well.  She has not had chest pain or shortness of breath.  She has not had syncope, orthopnea.  She did have lower extremity swelling and this improved with furosemide.  She does note worsening  claudication in her right leg.  She has an appointment w/ Dr. Gwenlyn Found soon.   ASSESSMENT & PLAN:   Chronic heart failure with preserved ejection fraction [facility]) Recent echocardiogram at [facility] demonstrated normal EF, mildly dilated IVC, small pericardial effusion and mild pulmonary hypertension.  She notes improved swelling with the addition of furosemide.  She is mainly limited by right leg claudication.  Current volume status appears stable.  She is NYHA II-IIb.  Continue current dose of furosemide.  Pericardial effusion Small pericardial effusion on echocardiogram done in September.  Arrange follow-up limited echocardiogram to reassess pericardial effusion.  She does not have any clinical symptoms to suggest worsening.  Aortic atherosclerosis (HCC) Continue clopidogrel, ezetimibe.  Mixed hyperlipidemia Managed by primary care.  Overall well controlled.  She remains on ezetimibe.  Peripheral arterial disease ([place]) She notes symptoms and worsening right lower extremity claudication.  She has an appointment coming up with Dr. Gwenlyn Found soon.  I have asked her to contact us if her symptoms should worsen so that she can get a sooner appointment.  Essential hypertension Blood pressure is well controlled.  Continue carvedilol 6.25 mg twice daily, irbesartan 150 mg daily.  Hypercoagulable state ([place]) Continue apixaban 5 mg twice daily.  Recent creatinine normal.  I will obtain a follow-up CBC today.  Morbid obesity ([place]) We discussed the importance of weight loss.  She has several programs through her company that may be able to help.  We briefly discussed looking into weight loss surgery if she does not have success with traditional weight loss programs.  Dispo:  Return in about 6 months (around 09/06/2021) for Routine Follow Up, w/ Dr. Acie Fredrickson.    Prior CV studies: Echocardiogram 01/14/2021 ([place]) Mild concentric LVH, EF 55-60, mild TR, RVSP 36 (mild pulmonary  hypertension), mildly dilated IVC, small pericardial effusion  Echocardiogram 04/18/2020 EF 60-65, no RWMA, moderate LVH, GR 1 DD, normal RVSF  Myoview 04/20/2018 EF 61, no ischemia or infarction; normal study  CCTA 03/04/17 CAC score 0, no evidence of CAD, dilated pulmonary artery (40 mm) suggestive of pulmonary hypertension    Past Medical History:  Diagnosis Date   Acute blood loss as cause of postoperative anemia    Anemia 01/22/2019   Chest pain    a. prior coronary CT without CAD.   Elevated sed rate 01/22/2019   Esophageal dysmotility    Essential hypertension 07/08/2016   GERD (gastroesophageal reflux disease)    MGUS (monoclonal gammopathy of unknown significance)    Numbness and tingling    PAD (peripheral artery disease) ([place])    a. 05/2018: PV angio subacute thrombotic occlusion of her popliteal artery. She underwent successful penumbra aspiration thrombectomy, PTA and self-expanding stenting using overlapping Tigris self-expanding stents of a long thrombotic occlusion of the popliteal, anterior tibial and tibioperoneal trunk. Procedure c/b pseudoaneurysm/ABL anemia.   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HCC)    Uterine fibroid    Vitamin D deficiency    Current Medications: Current Meds  Medication Sig   Cholecalciferol (VITAMIN D) 50 MCG (2000 UT) tablet Take 2,000 Units by mouth daily in the afternoon.   clopidogrel (PLAVIX) 75 MG tablet Take 1 tablet by mouth once daily   ELIQUIS 5 MG TABS tablet TAKE 1 TABLET(5 MG) BY MOUTH TWICE DAILY   ergocalciferol (VITAMIN D2) 1.25 MG (50000 UT) capsule Take 1 capsule by mouth once a week.   ezetimibe (ZETIA) 10 MG tablet Take 10 mg by mouth daily.   furosemide (LASIX) 20 MG tablet Take 20 mg by mouth daily.   irbesartan (AVAPRO) 150 MG tablet Take 1 tablet (150 mg total) by mouth daily.   megestrol (MEGACE) 40 MG tablet Take 1 tablet (40 mg total) by mouth daily in the afternoon.   metFORMIN (GLUCOPHAGE-XR) 500 MG 24 hr  tablet Take 1 tablet by mouth daily with breakfast.   potassium chloride (KLOR-CON) 10 MEQ tablet Take 1 tablet by mouth daily.   vitamin B-12 (CYANOCOBALAMIN) 1000 MCG tablet Take 1,000 mcg by mouth daily in the afternoon.    [DISCONTINUED] carvedilol (COREG) 6.25 MG tablet Take 1 tablet (6.25 mg total) by mouth 2 (two) times daily. Please make overdue appt with Dr. Acie Fredrickson before anymore refills. Thank you 1st attempt    Allergies:   Atorvastatin and Lisinopril   Social History   Tobacco Use   Smoking status: Never   Smokeless tobacco: Never  Vaping Use   Vaping Use: Never used  Substance Use Topics   Alcohol use: No   Drug use: No    Family Hx: The patient's family history includes Alzheimer's disease in her father; Healthy in her son; Hypertension in her mother. There is no history of Colon polyps, Crohn's disease, Rectal cancer, Stomach cancer, or Pancreatic cancer.  Review of Systems  Cardiovascular:  Positive for claudication.  Gastrointestinal:  Negative for hematochezia and melena.  Genitourinary:  Negative for hematuria.    EKGs/Labs/Other Test Reviewed:    EKG:  EKG is not ordered today.  The ekg ordered today demonstrates n/a  Recent Labs: No results found for requested  labs within last 8760 hours.   Recent Lipid Panel Lab Results  Component Value Date/Time   CHOL 119 09/26/2019 07:57 AM   CHOL 119 09/05/2018 08:09 AM   TRIG 76.0 09/26/2019 07:57 AM   HDL 45.10 09/26/2019 07:57 AM   HDL 43 09/05/2018 08:09 AM   LDLCALC 59 09/26/2019 07:57 AM   LDLCALC 55 09/05/2018 08:09 AM   LDLCALC 99 05/02/2017 10:21 AM   Labs obtained through [place] personally reviewed 02/19/2021: K+ 4.4, creatinine 0.91, ALT 11, A1c 5.7, TSH 0.61, total cholesterol 164, triglycerides 145, HDL 50, LDL 85  Risk Assessment/Calculations:          Physical Exam:    VS:  BP 122/70   Pulse 97   Ht 5\' 5"  (1.651 m)   Wt (!) 301 lb 6.4 oz (136.7 kg)   SpO2 97%   BMI 50.16  kg/m     Wt Readings from Last 3 Encounters:  03/09/21 (!) 301 lb 6.4 oz (136.7 kg)  12/24/20 296 lb 4.8 oz (134.4 kg)  11/12/20 298 lb (135.2 kg)    Constitutional:      Appearance: Healthy appearance. Not in distress.  Neck:     Vascular: No JVR.  Pulmonary:     Effort: Pulmonary effort is normal.     Breath sounds: No wheezing. No rales.  Cardiovascular:     Normal rate. Regular rhythm. Normal S1. Normal S2.      Murmurs: There is no murmur.     No rub.  Edema:    Peripheral edema absent.  Abdominal:     Palpations: Abdomen is soft.  Skin:    General: Skin is warm and dry.  Neurological:     Mental Status: Alert and oriented to person, place and time.     Cranial Nerves: Cranial nerves are intact.       Medication Adjustments/Labs and Tests Ordered: Current medicines are reviewed at length with the patient today.  Concerns regarding medicines are outlined above.  Tests Ordered: Orders Placed This Encounter  Procedures   CBC   ECHOCARDIOGRAM LIMITED   Medication Changes: Meds ordered this encounter  Medications   carvedilol (COREG) 6.25 MG tablet    Sig: Take 1 tablet (6.25 mg total) by mouth 2 (two) times daily.    Dispense:  180 tablet    Refill:  3   Signed, Richardson Dopp, PA-C  03/09/2021 10:43 AM    [address] [place], [place],   [address] Phone: [phone]; Fax: [phone]

## 2021-03-09 ENCOUNTER — Other Ambulatory Visit: Payer: BC Managed Care – PPO

## 2021-03-09 ENCOUNTER — Other Ambulatory Visit: Payer: Self-pay

## 2021-03-09 ENCOUNTER — Ambulatory Visit (INDEPENDENT_AMBULATORY_CARE_PROVIDER_SITE_OTHER): Payer: BC Managed Care – PPO | Admitting: Physician Assistant

## 2021-03-09 ENCOUNTER — Encounter: Payer: Self-pay | Admitting: Physician Assistant

## 2021-03-09 VITALS — BP 122/70 | HR 97 | Ht 65.0 in | Wt 301.4 lb

## 2021-03-09 DIAGNOSIS — I3139 Other pericardial effusion (noninflammatory): Secondary | ICD-10-CM | POA: Diagnosis not present

## 2021-03-09 DIAGNOSIS — I5032 Chronic diastolic (congestive) heart failure: Secondary | ICD-10-CM

## 2021-03-09 DIAGNOSIS — D6859 Other primary thrombophilia: Secondary | ICD-10-CM

## 2021-03-09 DIAGNOSIS — I2729 Other secondary pulmonary hypertension: Secondary | ICD-10-CM | POA: Diagnosis not present

## 2021-03-09 DIAGNOSIS — I700 Atherosclerosis of aorta: Secondary | ICD-10-CM

## 2021-03-09 DIAGNOSIS — I10 Essential (primary) hypertension: Secondary | ICD-10-CM

## 2021-03-09 DIAGNOSIS — I739 Peripheral vascular disease, unspecified: Secondary | ICD-10-CM

## 2021-03-09 DIAGNOSIS — E782 Mixed hyperlipidemia: Secondary | ICD-10-CM

## 2021-03-09 MED ORDER — CARVEDILOL 6.25 MG PO TABS: 6.2500 mg | ORAL_TABLET | Freq: Two times a day (BID) | ORAL | 3 refills | Status: DC

## 2021-03-09 NOTE — Assessment & Plan Note (Signed)
We discussed the importance of weight loss.  She has several programs through her company that may be able to help.  We briefly discussed looking into weight loss surgery if she does not have success with traditional weight loss programs.

## 2021-03-09 NOTE — Assessment & Plan Note (Signed)
Managed by primary care.  Overall well controlled.  She remains on ezetimibe.

## 2021-03-09 NOTE — Assessment & Plan Note (Signed)
Recent echocardiogram at [facility] demonstrated normal EF, mildly dilated IVC, small pericardial effusion and mild pulmonary hypertension.  She notes improved swelling with the addition of furosemide.  She is mainly limited by right leg claudication.  Current volume status appears stable.  She is NYHA II-IIb.  Continue current dose of furosemide.

## 2021-03-09 NOTE — Assessment & Plan Note (Signed)
Continue apixaban 5 mg twice daily.  Recent creatinine normal.  I will obtain a follow-up CBC today.

## 2021-03-09 NOTE — Patient Instructions (Signed)
Medication Instructions:   Your physician recommends that you continue on your current medications as directed. Please refer to the Current Medication list given to you today.  *If you need a refill on your cardiac medications before your next appointment, please call your pharmacy*   Lab Work:  Your physician recommends that you return for lab work on Friday, November 11. You can come in the day of appointment between 7:30-4:30.   If you have labs (blood work) drawn today and your tests are completely normal, you will receive your results only by: [place] (if you have MyChart) OR A paper copy in the mail If you have any lab test that is abnormal or we need to change your treatment, we will call you to review the results.   Testing/Procedures: Your physician has requested that you have an echocardiogram. Tuesday, November 15 @ 7:35. Echocardiography is a painless test that uses sound waves to create images of your heart. It provides your doctor with information about the size and shape of your heart and how well your heart's chambers and valves are working. This procedure takes approximately one hour. There are no restrictions for this procedure.    Follow-Up: At [facility], you and your health needs are our priority.  As part of our continuing mission to provide you with exceptional heart care, we have created designated Provider Care Teams.  These Care Teams include your primary Cardiologist (physician) and Advanced Practice Providers (APPs -  Physician Assistants and Nurse Practitioners) who all work together to provide you with the care you need, when you need it.  We recommend signing up for the patient portal called "MyChart".  Sign up information is provided on this After Visit Summary.  MyChart is used to connect with patients for Virtual Visits (Telemedicine).  Patients are able to view lab/test results, encounter notes, upcoming appointments, etc.  Non-urgent messages  can be sent to your provider as well.   To learn more about what you can do with MyChart, go to [email or web address].    Your next appointment:   6 month(s)  The format for your next appointment:   In Person  Provider:   Mertie Moores, MD     Other Instructions  Your physician wants you to follow-up in: 6 months with Dr. Acie Fredrickson.  You will receive a reminder letter in the mail two months in advance. If you don't receive a letter, please call our office to schedule the follow-up appointment.

## 2021-03-09 NOTE — Assessment & Plan Note (Signed)
Small pericardial effusion on echocardiogram done in September.  Arrange follow-up limited echocardiogram to reassess pericardial effusion.  She does not have any clinical symptoms to suggest worsening.

## 2021-03-09 NOTE — Assessment & Plan Note (Signed)
Continue clopidogrel, ezetimibe.

## 2021-03-09 NOTE — Assessment & Plan Note (Signed)
Blood pressure is well controlled.  Continue carvedilol 6.25 mg twice daily, irbesartan 150 mg daily.

## 2021-03-09 NOTE — Assessment & Plan Note (Signed)
She notes symptoms and worsening right lower extremity claudication.  She has an appointment coming up with Dr. Gwenlyn Found soon.  I have asked her to contact us if her symptoms should worsen so that she can get a sooner appointment.

## 2021-03-12 ENCOUNTER — Other Ambulatory Visit: Payer: Self-pay | Admitting: Cardiovascular Disease

## 2021-03-13 LAB — CBC
Hematocrit: 39.9 % (ref 34.0–46.6)
Hemoglobin: 13.3 g/dL (ref 11.1–15.9)
MCH: 30.2 pg (ref 26.6–33.0)
MCHC: 33.3 g/dL (ref 31.5–35.7)
MCV: 91 fL (ref 79–97)
Platelets: 253 10*3/uL (ref 150–450)
RBC: 4.4 x10E6/uL (ref 3.77–5.28)
RDW: 13.9 % (ref 11.7–15.4)
WBC: 7.1 10*3/uL (ref 3.4–10.8)

## 2021-03-17 ENCOUNTER — Other Ambulatory Visit (HOSPITAL_COMMUNITY): Payer: BC Managed Care – PPO

## 2021-03-20 ENCOUNTER — Ambulatory Visit
Admission: RE | Admit: 2021-03-20 | Discharge: 2021-03-20 | Disposition: A | Discharge status: Home / Self Care | Payer: BC Managed Care – PPO | Source: Ambulatory Visit | Attending: Internal Medicine | Admitting: Internal Medicine

## 2021-03-20 ENCOUNTER — Other Ambulatory Visit: Payer: Self-pay

## 2021-03-20 DIAGNOSIS — N644 Mastodynia: Secondary | ICD-10-CM

## 2021-03-31 ENCOUNTER — Encounter: Payer: Self-pay | Admitting: Physician Assistant

## 2021-03-31 ENCOUNTER — Other Ambulatory Visit: Payer: Self-pay

## 2021-03-31 ENCOUNTER — Ambulatory Visit (HOSPITAL_COMMUNITY): Payer: BC Managed Care – PPO | Attending: Cardiology

## 2021-03-31 DIAGNOSIS — I2729 Other secondary pulmonary hypertension: Secondary | ICD-10-CM

## 2021-03-31 DIAGNOSIS — E782 Mixed hyperlipidemia: Secondary | ICD-10-CM

## 2021-03-31 DIAGNOSIS — I5032 Chronic diastolic (congestive) heart failure: Secondary | ICD-10-CM | POA: Diagnosis present

## 2021-03-31 DIAGNOSIS — D6859 Other primary thrombophilia: Secondary | ICD-10-CM | POA: Diagnosis present

## 2021-03-31 DIAGNOSIS — I10 Essential (primary) hypertension: Secondary | ICD-10-CM

## 2021-03-31 DIAGNOSIS — I700 Atherosclerosis of aorta: Secondary | ICD-10-CM | POA: Diagnosis present

## 2021-03-31 DIAGNOSIS — I739 Peripheral vascular disease, unspecified: Secondary | ICD-10-CM

## 2021-03-31 DIAGNOSIS — I3139 Other pericardial effusion (noninflammatory): Secondary | ICD-10-CM

## 2021-03-31 LAB — ECHOCARDIOGRAM LIMITED
AV Peak grad: 6.3 mmHg
Ao pk vel: 1.25 m/s
Area-P 1/2: 3.72 cm2
S' Lateral: 2 cm

## 2021-04-14 ENCOUNTER — Ambulatory Visit (INDEPENDENT_AMBULATORY_CARE_PROVIDER_SITE_OTHER): Payer: BC Managed Care – PPO | Admitting: Cardiovascular Disease

## 2021-04-14 ENCOUNTER — Encounter: Payer: Self-pay | Admitting: Cardiovascular Disease

## 2021-04-14 ENCOUNTER — Other Ambulatory Visit: Payer: Self-pay

## 2021-04-14 VITALS — BP 116/74 | HR 91 | Ht 65.0 in | Wt 304.2 lb

## 2021-04-14 DIAGNOSIS — I739 Peripheral vascular disease, unspecified: Secondary | ICD-10-CM

## 2021-04-14 NOTE — Patient Instructions (Signed)
Medication Instructions:  Your physician recommends that you continue on your current medications as directed. Please refer to the Current Medication list given to you today.  *If you need a refill on your cardiac medications before your next appointment, please call your pharmacy*   Testing/Procedures: Your physician has requested that you have a lower extremity arterial duplex. This test is an ultrasound of the arteries in the legs. It looks at arterial blood flow in the legs. Allow one hour for Lower Arterial scans. There are no restrictions or special instructions  Your physician has requested that you have an ankle brachial index (ABI). During this test an ultrasound and blood pressure cuff are used to evaluate the arteries that supply the arms and legs with blood. Allow thirty minutes for this exam. There are no restrictions or special instructions.  To be done in June 2023. These procedures are done at [place].  Follow-Up: At [facility], you and your health needs are our priority.  As part of our continuing mission to provide you with exceptional heart care, we have created designated Provider Care Teams.  These Care Teams include your primary Cardiologist (physician) and Advanced Practice Providers (APPs -  Physician Assistants and Nurse Practitioners) who all work together to provide you with the care you need, when you need it.  We recommend signing up for the patient portal called "MyChart".  Sign up information is provided on this After Visit Summary.  MyChart is used to connect with patients for Virtual Visits (Telemedicine).  Patients are able to view lab/test results, encounter notes, upcoming appointments, etc.  Non-urgent messages can be sent to your provider as well.   To learn more about what you can do with MyChart, go to [email or web address].    Your next appointment:   6 month(s)  The format for your next appointment:   In Person  Provider:   Quay Burow, MD

## 2021-04-14 NOTE — Progress Notes (Signed)
Ms. Wuest returns today for follow-up of her PAD.  I last saw her in the office 11/07/2020.  She does not wish to pursue surgical revascularization.  We talked about the risks and benefits of repeat endovascular therapy for her tibioperoneal stents which seem to have restenosed once again by recent Dopplers.  She is symptomatic.  I did tell her that this would unlikely to be a long-term fix for her PAD and would likely return over time.  She wishes to pursue weight loss and conservative therapy at this time.  I am going to refer her to the [facility].  We will repeat lower extremity Doppler studies in 6 months and I will see her back after that and follow-up.  12-lead EKG: Sinus rhythm at 91 with borderline voltage criteria for LVH.  I personally reviewed this EKG.  Lorretta Harp, M.D., [place], [facility], [place] [address] [place], [place]  [address]  [phone] 04/14/2021 3:42 PM

## 2021-04-15 ENCOUNTER — Encounter: Payer: Self-pay | Admitting: Cardiovascular Disease

## 2021-04-28 ENCOUNTER — Ambulatory Visit (INDEPENDENT_AMBULATORY_CARE_PROVIDER_SITE_OTHER): Payer: BC Managed Care – PPO | Admitting: Family Medicine

## 2021-04-28 VITALS — BP 129/84 | Ht 65.0 in | Wt 303.0 lb

## 2021-04-28 DIAGNOSIS — M545 Low back pain, unspecified: Secondary | ICD-10-CM | POA: Diagnosis not present

## 2021-04-28 MED ORDER — BACLOFEN 10 MG PO TABS: 5.0000 mg | ORAL_TABLET | Freq: Two times a day (BID) | ORAL | 1 refills | Status: DC | PRN

## 2021-04-28 NOTE — Patient Instructions (Signed)
Good to see you Please try heat  Please try physical therapy  Please try the exercises   Please send me a message in MyChart with any questions or updates.  Please see me back in 4-6 weeks.   --Dr. Raeford Razor

## 2021-04-28 NOTE — Assessment & Plan Note (Signed)
Acute on chronic in nature.  Having spasms intermittently. -Counseled on home exercise therapy and supportive care. -Referral to physical therapy. -Baclofen. -Could consider further imaging.

## 2021-04-28 NOTE — Progress Notes (Signed)
Christina [name] - 55 y.o. female MRN [id number]  Date of birth: Mar 15, 1966  SUBJECTIVE:  Including CC & ROS.  No chief complaint on file.   Christina [name] is a 55 y.o. female that is presenting with acute on chronic low back pain.  The pain is severe in nature and intermittent.  She has episodes that are severe in nature and occur when she is washing dishes or cooking.  No radicular component.  Independent review of the lumbar spine x-ray from 2020 shows mild scoliosis   Review of Systems See HPI   HISTORY: Past Medical, Surgical, Social, and Family History Reviewed & Updated per EMR.   Pertinent Historical Findings include:  Past Medical History:  Diagnosis Date   Acute blood loss as cause of postoperative anemia    Anemia 01/22/2019   Chest pain    a. prior coronary CT without CAD.   Elevated sed rate 01/22/2019   Esophageal dysmotility    Essential hypertension 07/08/2016   GERD (gastroesophageal reflux disease)    MGUS (monoclonal gammopathy of unknown significance)    Numbness and tingling    PAD (peripheral artery disease) ([place])    a. 05/2018: PV angio subacute thrombotic occlusion of her popliteal artery. She underwent successful penumbra aspiration thrombectomy, PTA and self-expanding stenting using overlapping Tigris self-expanding stents of a long thrombotic occlusion of the popliteal, anterior tibial and tibioperoneal trunk. Procedure c/b pseudoaneurysm/ABL anemia.   Pericardial effusion    Echo 9/22: Mild concentric LVH, EF 55-60, mild TR, RVSP 36 (mild pulmonary hypertension), mildly dilated IVC, small pericardial effusion // Echocardiogram 11/22: EF 62, Gr 1 DD, normal RVSF, trivial pericardial effusion   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[place])    Uterine fibroid    Vitamin D deficiency     Past Surgical History:  Procedure Laterality Date   ABDOMINAL AORTOGRAM W/LOWER EXTREMITY Right 05/08/2018   Procedure: ABDOMINAL AORTOGRAM W/LOWER EXTREMITY;  Surgeon: Lorretta Harp, MD;  Location: [place];  Service: Cardiovascular;  Laterality: Right;   BREAST BIOPSY     BREAST EXCISIONAL BIOPSY Left    age 47   LOWER EXTREMITY ANGIOGRAPHY  07/12/2019   Procedure: Lower Extremity Angiography;  Surgeon: Lorretta Harp, MD;  Location: [place] [facility];  Service: Cardiovascular;;   PERIPHERAL VASCULAR BALLOON ANGIOPLASTY  07/12/2019   Procedure: PERIPHERAL VASCULAR BALLOON ANGIOPLASTY;  Surgeon: Lorretta Harp, MD;  Location: [place] [facility];  Service: Cardiovascular;;   PERIPHERAL VASCULAR INTERVENTION Right 05/08/2018   Procedure: PERIPHERAL VASCULAR INTERVENTION;  Surgeon: Lorretta Harp, MD;  Location: [place] [facility];  Service: Cardiovascular;  Laterality: Right;  Anterior tibial and popliteal stents   PERIPHERAL VASCULAR THROMBECTOMY Right 05/08/2018   Procedure: PERIPHERAL VASCULAR THROMBECTOMY;  Surgeon: Lorretta Harp, MD;  Location: [place] [facility];  Service: Cardiovascular;  Laterality: Right;  Popliteal, tibioperoneal trunk, Anterior tibial, Posterior tibial    Family History  Problem Relation Age of Onset   Hypertension Mother    Alzheimer's disease Father    Healthy Son    Colon polyps Neg Hx    Crohn's disease Neg Hx    Rectal cancer Neg Hx    Stomach cancer Neg Hx    Pancreatic cancer Neg Hx     Social History   Socioeconomic History   Marital status: Single    Spouse name: Not on file   Number of children: 1   Years of education: two years of college   Highest education level:  Not on file  Occupational History   Not on file  Tobacco Use   Smoking status: Never   Smokeless tobacco: Never  Vaping Use   Vaping Use: Never used  Substance and Sexual Activity   Alcohol use: No   Drug use: No   Sexual activity: Yes  Other Topics Concern   Not on file  Social History Narrative   Lives alone.   Right-handed.   No daily use of caffeine.   Social Determinants of Health   Financial Resource  Strain: Not on file  Food Insecurity: Not on file  Transportation Needs: Not on file  Physical Activity: Not on file  Stress: Not on file  Social Connections: Not on file  Intimate Partner Violence: Not on file     PHYSICAL EXAM:  VS: BP 129/84    Ht 5\' 5"  (1.651 m)    Wt (!) 303 lb (137.4 kg)    BMI 50.42 kg/m  Physical Exam Gen: NAD, alert, cooperative with exam, well-appearing    ASSESSMENT & PLAN:   Acute bilateral low back pain without sciatica Acute on chronic in nature.  Having spasms intermittently. -Counseled on home exercise therapy and supportive care. -Referral to physical therapy. -Baclofen. -Could consider further imaging.

## 2021-05-12 ENCOUNTER — Ambulatory Visit: Payer: BC Managed Care – PPO | Admitting: Gastroenterology

## 2021-06-09 ENCOUNTER — Ambulatory Visit: Payer: BC Managed Care – PPO | Admitting: Family Medicine

## 2021-06-12 ENCOUNTER — Other Ambulatory Visit: Payer: Self-pay

## 2021-06-12 ENCOUNTER — Encounter: Payer: Self-pay | Admitting: Cardiovascular Disease

## 2021-06-12 ENCOUNTER — Other Ambulatory Visit: Payer: Self-pay | Admitting: Cardiovascular Disease

## 2021-06-12 ENCOUNTER — Encounter: Payer: Self-pay | Admitting: Obstetrics & Gynecology

## 2021-06-12 MED ORDER — CLOPIDOGREL BISULFATE 75 MG PO TABS: 75.0000 mg | ORAL_TABLET | Freq: Every day | ORAL | 3 refills | Status: DC

## 2021-06-15 ENCOUNTER — Other Ambulatory Visit: Payer: Self-pay

## 2021-06-15 DIAGNOSIS — N939 Abnormal uterine and vaginal bleeding, unspecified: Secondary | ICD-10-CM

## 2021-06-15 DIAGNOSIS — D219 Benign neoplasm of connective and other soft tissue, unspecified: Secondary | ICD-10-CM

## 2021-06-15 MED ORDER — MEGESTROL ACETATE 40 MG PO TABS: 40.0000 mg | ORAL_TABLET | Freq: Every day | ORAL | 3 refills | Status: DC

## 2021-07-01 ENCOUNTER — Ambulatory Visit (INDEPENDENT_AMBULATORY_CARE_PROVIDER_SITE_OTHER): Payer: BC Managed Care – PPO | Admitting: Obstetrics & Gynecology

## 2021-07-01 ENCOUNTER — Encounter: Payer: Self-pay | Admitting: Obstetrics & Gynecology

## 2021-07-01 ENCOUNTER — Other Ambulatory Visit: Payer: Self-pay

## 2021-07-01 VITALS — BP 116/70 | HR 80 | Wt 306.0 lb

## 2021-07-01 DIAGNOSIS — Z01419 Encounter for gynecological examination (general) (routine) without abnormal findings: Secondary | ICD-10-CM

## 2021-07-01 DIAGNOSIS — Z1211 Encounter for screening for malignant neoplasm of colon: Secondary | ICD-10-CM | POA: Diagnosis not present

## 2021-07-01 DIAGNOSIS — N950 Postmenopausal bleeding: Secondary | ICD-10-CM | POA: Diagnosis not present

## 2021-07-01 NOTE — Progress Notes (Signed)
Subjective:  ?  ? Christina [name] is a 56 y.o. female here for a routine exam.  Current complaints: Pt reports that is she stops the Megace she starts to bleed. She reports that she doesn't want me to be upset that she, again, cancelled her procedure. She reports that she doesn't have anyone to take her to get a colonoscopy or go with her for the hysteroscopy. She reports that her mother died of colon cancer but, she has never had a colonoscopy.   She reports that she works from home and has no friends or family in the area. She reports that she lives next to a 'home' with men so has no neighbors who could help her. She denies pain. She reports that she has not been exercising.      ?  ?Gynecologic History ?No LMP recorded. (Menstrual status: Other). ?Contraception: post menopausal status ?Last Pap: 02/27/2020. Results were: normal ?Last mammogram: 03/20/2021. Results were: normal ? ?Obstetric History ?OB History  ?Gravida Para Term Preterm AB Living  ?1 1          ?SAB IAB Ectopic Multiple Live Births  ?        1  ?  ?# Outcome Date GA Lbr Len/2nd Weight Sex Delivery Anes PTL Lv  ?1 Para      CS-Unspec     ? ?The following portions of the patient's history were reviewed and updated as appropriate: allergies, current medications, past family history, past medical history, past social history, past surgical history, and problem list. ? ?Review of Systems ?Pertinent items are noted in HPI.  ?  ?Objective:  ?BP 116/70   Pulse 80   Wt (!) 306 lb (138.8 kg)   BMI 50.92 kg/m?  ?General Appearance:    Alert, cooperative, no distress, appears stated age  ?Head:    Normocephalic, without obvious abnormality, atraumatic  ?Eyes:    conjunctiva/corneas clear, EOM's intact, both eyes  ?Ears:    Normal external ear canals, both ears  ?Nose:   Nares normal, septum midline, mucosa normal, no drainage    or sinus tenderness  ?Throat:   Lips, mucosa, and tongue normal; teeth and gums normal  ?Neck:   Supple, symmetrical, trachea  midline, no adenopathy;  ?  thyroid:  no enlargement/tenderness/nodules  ?Back:     Symmetric, no curvature, ROM normal, no CVA tenderness  ?Lungs:     respirations unlabored  ?Chest Wall:    No tenderness or deformity  ? Heart:    Regular rate and rhythm  ?Breast Exam:    No tenderness, masses, or nipple abnormality  ?Abdomen:     Soft, non-tender, bowel sounds active all four quadrants,  ?  no masses, no organomegaly  ?Genitalia:    Normal female without lesion, discharge or tenderness  ? Cervix is without lesions. The uterus could not be fully palpated due to pts body habitus.    ?Extremities:   Extremities normal, atraumatic, no cyanosis or edema  ?Pulses:   2+ and symmetric all extremities  ?Skin:   Skin color, texture, turgor normal, no rashes or lesions  ?  ? ? ?Assessment:  ? ? Healthy female exam.  ?Post menopausal bleeding: I have stressed to pt that the endo bx is not 100% and that I am concerned that she is still bleeding. She reports that she will try to procure a ride for the procedure and will call us.  I have told her that I cannot rule out cancer of her  uterus. She will cont the Megace until the procedure.  ? ?Colon cancer screening- pt reports that although she has no one to take her, she will ask her boss. I have stressed to her the increased risk due to her FH.  She said she would call her GI doc. I have ordered Cologard and will have a note sent to pt to complete it.     ? ?  ?Plan:  ?Darthy was seen today for annual exam. ? ?Diagnoses and all orders for this visit: ? ?Well female exam with routine gynecological exam ? ?Post-menopausal bleeding ? ?Colon cancer screening ?-     Ambulatory referral to Gastroenterology ?-     Cologuard ? ? Lashauna Arpin L. Harraway-Smith, M.D., [place] ? ?

## 2021-07-02 ENCOUNTER — Telehealth: Payer: Self-pay

## 2021-07-02 NOTE — Telephone Encounter (Signed)
-----  Message from Lavonia Drafts, MD sent at 07/01/2021  2:12 PM EST ----- ?Please call pt. We are sending a Cologard kit ot her home. She should complete it and send it back. It is for Colon cancer screening.  ? ?(Now she can ask her boss to bring her to her hysteroscopy)! ? ?Clh-S  ? ?

## 2021-07-02 NOTE — Telephone Encounter (Signed)
Pt made aware that Cologard kit will be mailed to her. Pt states she received an email yesterday. ?Astrid Vides l Vi Biddinger, CMA  ?

## 2021-07-17 ENCOUNTER — Ambulatory Visit (INDEPENDENT_AMBULATORY_CARE_PROVIDER_SITE_OTHER): Payer: BC Managed Care – PPO | Admitting: Nurse Practitioner

## 2021-07-17 ENCOUNTER — Encounter: Payer: Self-pay | Admitting: Nurse Practitioner

## 2021-07-17 VITALS — BP 132/68 | HR 96 | Ht 64.75 in | Wt 307.4 lb

## 2021-07-17 DIAGNOSIS — Z7901 Long term (current) use of anticoagulants: Secondary | ICD-10-CM

## 2021-07-17 DIAGNOSIS — Z1211 Encounter for screening for malignant neoplasm of colon: Secondary | ICD-10-CM

## 2021-07-17 DIAGNOSIS — Z7902 Long term (current) use of antithrombotics/antiplatelets: Secondary | ICD-10-CM

## 2021-07-17 DIAGNOSIS — I739 Peripheral vascular disease, unspecified: Secondary | ICD-10-CM

## 2021-07-17 DIAGNOSIS — R195 Other fecal abnormalities: Secondary | ICD-10-CM | POA: Diagnosis not present

## 2021-07-17 NOTE — Progress Notes (Signed)
? ? ? ?07/17/2021 ?Christina [name] ?[id number] ?24-Feb-1966 ? ? ?Chief Complaint: Schedule colonoscopy. ? 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  ? ?I last saw Christina [name] in the office 04/30/2020 to schedule a screening colonoscopy.  Mother with history of colon cancer which was diagnosed at the age of 51.  A colonoscopy at [facility] due to her BMI > 50 was recommended, however, she did not wish to pursue a colonoscopy in the hospital setting.  She had the intentions of losing weight and when her BMI was < 50 she was hoping to schedule her colonoscopy in our outpatient center.  However, she did not lose weight and did not return for further follow-up until today. ? ?She denies having any upper or lower abdominal pain.  She is passing a normal formed brown bowel movement daily.  No rectal bleeding or black stools.  She remains on Plavix and Eliquis secondary to her history of hypercoagulable disorder and significant peripheral arterial disease s/p right thrombectomy with angioplasty and stent placement extending from the popliteal to anterior tibial artery 05/2018 and s/p right popliteal angioplasty for in-stent stenosis 07/12/2019. She had repeat doppler studies 10/03/2020 which revealed a decline in her right ABI and moderately increased velocities within her stent.  She was referred to vascular surgeon Dr. Fortunato Curling who offered right fem-tib bypass grafting but she did  not wish to pursue any further surgical revascularization. She was last seen by Dr. Gwenlyn Found 04/14/2021 and a repeat doppler showed her tibioperoneal stents restenosed. She was asymptomatic and she declined any further endovascular intervention or surgery. ? ?She denies having any chest pain, palpitations or shortness of breath.  No significant lower extremity swelling. ? ? ?CBC Latest Ref Rng & Units 03/13/2021 07/13/2019 07/02/2019  ?WBC 3.4 - 10.8 x10E3/uL 7.1 8.8 6.6  ?Hemoglobin 11.1 - 15.9 g/dL 13.3 11.3(L) 13.1  ?Hematocrit 34.0 - 46.6 % 39.9 34.0(L) 39.4  ?Platelets 150 - 450 x10E3/uL 253 225 278  ?  ?CMP Latest Ref Rng & Units 07/13/2019 07/02/2019 05/21/2019  ?Glucose 70 - 99 mg/dL 104(H) 127(H) 102(H)  ?BUN 6 - 20 mg/dL '13 14 10  '$ ?Creatinine 0.44 - 1.00 mg/dL 0.89 0.88 0.98  ?Sodium 135 - 145 mmol/L 138 139 138  ?Potassium 3.5 - 5.1 mmol/L 3.9 4.5 4.2  ?Chloride 98 - 111 mmol/L 104 104 104  ?CO2 22 - 32 mmol/L 22 19(L) 25  ?Calcium 8.9 - 10.3 mg/dL 9.0 10.0 9.1  ?Total Protein 6.5 - 8.1 g/dL - - -  ?Total Bilirubin 0.3 - 1.2 mg/dL - - -  ?Alkaline Phos 38 - 126 U/L - - -  ?AST 15 - 41 U/L - - -  ?ALT 0 - 44 U/L - - -  ?  ?Echo 03/31/2021: ?IMPRESSIONS ?Left ventricular ejection fraction by 3D volume is 62 %. The left ventricle has no ?regional wall motion abnormalities. Left ventricular diastolic parameters are ?consistent with Grade I diastolic dysfunction (impaired relaxation). ?1. ?2. Right ventricular systolic function is normal. The right ventricular size is normal. ?  A trivial pericardial effusion is circumferential. There is no evidence of cardiac ?tamponade. ?3. ?The mitral valve is normal in structure. No evidence of mitral valve regurgitation. No ?evidence of mitral stenosis. ?4. ?The aortic valve is tricuspid. Aortic valve regurgitation is not visualized. No aortic ?stenosis is present. ?5. ?The inferior vena cava is normal in size with greater than 50% respiratory variability, ?suggesting right atrial  pressure of 3 mmHg. ? ?Current Outpatient Medications on File Prior to Visit  ?Medication Sig Dispense Refill  ? baclofen (LIORESAL) 10 MG tablet Take 0.5 tablets (5 mg total) by mouth 2 (two) times daily as needed for muscle spasms. 45 each 1  ? carvedilol (COREG) 6.25 MG tablet Take 1 tablet (6.25 mg total) by mouth 2 (two) times daily with a meal. 180 tablet 3  ? Cholecalciferol (VITAMIN D) 50 MCG (2000 UT) tablet Take 2,000 Units by mouth daily in the afternoon.    ? clopidogrel (PLAVIX) 75 MG tablet Take 1 tablet (75 mg total) by mouth daily. 90 tablet 3  ? clotrimazole (LOTRIMIN) 1 % external solution Apply 1 application. topically 2 (two) times daily.    ? ELIQUIS 5 MG TABS tablet TAKE 1 TABLET(5 MG) BY MOUTH TWICE DAILY 180 tablet 1  ? ergocalciferol (VITAMIN D2) 1.25 MG (50000 UT) capsule Take 1 capsule by mouth once a week.    ? ezetimibe (ZETIA) 10 MG tablet Take 10 mg by mouth daily.    ? furosemide (LASIX) 20 MG tablet Take 20 mg by mouth daily.    ? irbesartan (AVAPRO) 150 MG tablet Take 1 tablet (150 mg total) by mouth daily. 90 tablet 2  ? megestrol (MEGACE) 40 MG tablet Take 1 tablet (40 mg total) by mouth daily in the afternoon. 90 tablet 3  ? metFORMIN (GLUCOPHAGE-XR) 500 MG 24 hr tablet Take 1 tablet by mouth daily with breakfast.    ? potassium chloride (KLOR-CON) 10 MEQ tablet Take 1 tablet by mouth daily.    ? triamcinolone lotion (KENALOG) 0.1 % Apply 1 application. topically daily as needed.    ? vitamin B-12 (CYANOCOBALAMIN) 1000 MCG tablet Take 1,000 mcg by mouth daily in the afternoon.     ? terbinafine (LAMISIL) 250 MG tablet Take 250 mg by mouth daily. (Patient not taking: Reported on 07/17/2021)    ? ?No current facility-administered medications on file prior to visit.  ? ?Allergies  ?Allergen Reactions  ? Lipitor [Atorvastatin]   ?  Other reaction(s): Other (See Comments) ?Myalgias  ? Lisinopril Cough  ? ? ?Current Medications, Allergies, Past Medical History, Past Surgical History,  Family History and Social History were reviewed in [employer] record. ? ?Review of Systems:   ?Constitutional: Negative for fever, sweats, chills or weight loss.  ?Respiratory: Negative for shortness of breath.   ?Cardiovascular: Negative for chest pain, palpitations and leg swelling.  ?Gastrointestinal: See HPI.  ?Musculoskeletal: Negative for back pain or muscle aches.  ?Neurological: Negative for dizziness, headaches or paresthesias.  ? ?Physical Exam: ?BP 132/68 (BP Location: Left Arm, Patient Position: Sitting, Cuff Size: Large)   Pulse 96   Ht 5' 4.75" (1.645 m) Comment: height measured without shoes  Wt (!) 307 lb 6 oz (139.4 kg)   BMI 51.55 kg/m?  ?General: Obese 56 year old female in no acute distress ?Head: Normocephalic and atraumatic. ?Eyes: No scleral icterus. Conjunctiva pink . ?Ears: Normal auditory acuity. ?Mouth: Dentition intact. No ulcers or lesions.  ?Lungs: Clear throughout to auscultation. ?Heart: Regular rate and rhythm,  no murmur. ?Abdomen: Soft, nontender and nondistended. No masses or hepatomegaly. Normal bowel sounds x 4 quadrants.  ?Rectal: Deferred. ?Musculoskeletal: Symmetrical with no gross deformities. ?Extremities: No edema. ?Neurological: Alert oriented x 4. No focal deficits.  ?Psychological: Alert and cooperative. Normal mood and affect ? ?Assessment and Recommendations: ? ?61) 56 year old morbidly obese female presents to schedule a screening colonoscopy.  Negative Cologuard in 2019.  Mother was diagnosed with colon cancer at the age of 89.  ?-Colonoscopy at [facility] benefits and risks discussed including risk with sedation, risk of bleeding, perforation and infection  ?-Request cardiac clearance with Dr. Quay Burow to include Plavix and Eliquis instructions prior to the patient proceeding with a colonoscopy.  In the setting of having severe peripheral arterial disease on Plavix and hypercoagulable state on Eliquis, she is at high risk  for thrombosis formation off Plavix and Eliquis.  To consider a diagnostic colonoscopy if Plavix and Eliquis cannot be held. ? ?2) Significant peripheral artery disease s/p right thrombectomy with angioplasty an

## 2021-07-17 NOTE — Progress Notes (Signed)
Patient will need colonoscopy at [facility] with Dr. Havery Moros. Currently he is booked through May. Once next dates open and we get her booked, we will need to send for anticoagulation clearance for Plavix and Eliquis. Reminder has been set. ?

## 2021-07-17 NOTE — Patient Instructions (Signed)
Our office will contact you to schedule a colonoscopy at [facility] with Dr. Havery Moros when his June schedule opens. ? ?We will send for clearance to hold your Plavix and Eliquis once it get closer to June. ? ?Thank you for trusting me with your gastrointestinal care!   ? ?Noralyn Pick, CRNP ? ? ? ?BMI: ? ?If you are age 58 or older, your body mass index should be between 23-30. Your Body mass index is 51.55 kg/m?Marland Kitchen If this is out of the aforementioned range listed, please consider follow up with your Primary Care Provider. ? ?If you are age 54 or younger, your body mass index should be between 19-25. Your Body mass index is 51.55 kg/m?Marland Kitchen If this is out of the aformentioned range listed, please consider follow up with your Primary Care Provider.  ? ?MY CHART: ? ?The [place] [facility] providers would like to encourage you to use [facility] to communicate with providers for non-urgent requests or questions.  Due to long hold times on the telephone, sending your provider a message by [facility] may be a faster and more efficient way to get a response.  Please allow 48 business hours for a response.  Please remember that this is for non-urgent requests.  ? ?

## 2021-07-17 NOTE — Progress Notes (Signed)
Agree with assessment and plan as outlined. Will await clearance from cardiology to determine if she is able to hold anticoagulation for an elective procedure and determine risks for doing that form their perspective and if she warrants a lovenox bridge, etc. If thought too high risk for colonoscopy and unable to hold antiplatelet / anticoagulants could consider virtual colonoscopy to rule out any high risk lesions. ?

## 2021-07-27 ENCOUNTER — Other Ambulatory Visit: Payer: Self-pay

## 2021-07-27 ENCOUNTER — Ambulatory Visit (INDEPENDENT_AMBULATORY_CARE_PROVIDER_SITE_OTHER): Payer: BC Managed Care – PPO | Admitting: Family Medicine

## 2021-07-27 ENCOUNTER — Other Ambulatory Visit (HOSPITAL_COMMUNITY)
Admission: RE | Admit: 2021-07-27 | Discharge: 2021-07-27 | Disposition: A | Discharge status: Home / Self Care | Payer: BC Managed Care – PPO | Source: Ambulatory Visit | Attending: Family Medicine | Admitting: Family Medicine

## 2021-07-27 ENCOUNTER — Encounter: Payer: Self-pay | Admitting: Family Medicine

## 2021-07-27 VITALS — BP 155/90 | HR 88 | Ht 65.0 in | Wt 307.0 lb

## 2021-07-27 DIAGNOSIS — Z124 Encounter for screening for malignant neoplasm of cervix: Secondary | ICD-10-CM | POA: Diagnosis not present

## 2021-07-27 DIAGNOSIS — R102 Pelvic and perineal pain: Secondary | ICD-10-CM | POA: Insufficient documentation

## 2021-07-27 MED ORDER — DOXYCYCLINE HYCLATE 100 MG PO CAPS: 100.0000 mg | ORAL_CAPSULE | Freq: Two times a day (BID) | ORAL | 0 refills | Status: DC

## 2021-07-27 NOTE — Progress Notes (Deleted)
Subjective:  ?  ? Christina [name] is a 56 y.o. female and is here for a comprehensive physical exam. The patient reports {problems:16946}. ? ?Social History  ? ?Socioeconomic History  ?? Marital status: Single  ?  Spouse name: Not on file  ?? Number of children: 1  ?? Years of education: two years of college  ?? Highest education level: Not on file  ?Occupational History  ?? Not on file  ?Tobacco Use  ?? Smoking status: Never  ?? Smokeless tobacco: Never  ?Vaping Use  ?? Vaping Use: Never used  ?Substance and Sexual Activity  ?? Alcohol use: No  ?? Drug use: No  ?? Sexual activity: Yes  ?Other Topics Concern  ?? Not on file  ?Social History Narrative  ? Lives alone.  ? Right-handed.  ? No daily use of caffeine.  ? ?Social Determinants of Health  ? ?Financial Resource Strain: Not on file  ?Food Insecurity: Not on file  ?Transportation Needs: Not on file  ?Physical Activity: Not on file  ?Stress: Not on file  ?Social Connections: Not on file  ?Intimate Partner Violence: Not on file  ? ?Health Maintenance  ?Topic Date Due  ?? COVID-19 Vaccine (4 - Booster for Pfizer series) 04/19/2020  ?? Fecal DNA (Cologuard)  05/08/2020  ?? Zoster Vaccines- Shingrix (2 of 2) 09/01/2020  ?? MAMMOGRAM  12/16/2022  ?? PAP SMEAR-Modifier  02/27/2023  ?? TETANUS/TDAP  03/31/2027  ?? INFLUENZA VACCINE  Completed  ?? Hepatitis C Screening  Completed  ?? HIV Screening  Completed  ?? HPV VACCINES  Aged Out  ? ? ?{Common ambulatory SmartLinks:19316} ? ?Review of Systems ?{ros; complete:30496}  ? ?Objective:  ? ? {Exam, Complete:[phone]}  ?  ?Assessment:  ? ? Healthy female exam. ***   ?  ?Plan:  ? ?  ?See After Visit Summary for Counseling Recommendations  ? ?

## 2021-07-27 NOTE — Progress Notes (Signed)
Last mammogram 12/17/2020 ?Colonoscopy scheduled for June 2023 ? ?Patient states she has a lot of fibroids but she takes megace for the bleeding. She did have some spotting two weeks ago. Kathrene Alu RN  ?

## 2021-07-27 NOTE — Progress Notes (Signed)
? ?  Subjective:  ? ? Patient ID: Christina [name] is a 56 y.o. female presenting with Gynecologic Exam ? on 07/27/2021 ? ?HPI: ?Has long h/o postmenopausal bleeding. Negative EMB in 07/22. Has known fibroids and is on Eliquis and Plavix. Has had hysteroscopy recommended and she does not have the support needed to have this procedure. She is on megace which keeps her bleeding under control. Seen here for annual exam on 07/01/2021. She has had some sharp pains in her lower abdomen. Painful when removed her tampon. Wants a pap smear. ? ?Review of Systems  ?Constitutional:  Negative for chills and fever.  ?Respiratory:  Negative for shortness of breath.   ?Cardiovascular:  Negative for chest pain.  ?Gastrointestinal:  Negative for abdominal pain, nausea and vomiting.  ?Genitourinary:  Negative for dysuria.  ?Skin:  Negative for rash.  ?   ?Objective:  ?  ?BP (!) 155/90 Comment: pt states she didnt take her BP pills this morning  Pulse 88   Ht '5\' 5"'$  (1.651 m)   Wt (!) 307 lb (139.3 kg)   BMI 51.09 kg/m?  ?Physical Exam ?Exam conducted with a chaperone present.  ?Constitutional:   ?   General: She is not in acute distress. ?   Appearance: She is well-developed.  ?HENT:  ?   Head: Normocephalic and atraumatic.  ?Eyes:  ?   General: No scleral icterus. ?Cardiovascular:  ?   Rate and Rhythm: Normal rate.  ?Pulmonary:  ?   Effort: Pulmonary effort is normal.  ?Abdominal:  ?   Palpations: Abdomen is soft.  ?Genitourinary: ?   General: Normal vulva.  ?   Comments: BUS normal, vagina is pink and rugated, cervix is without lesion, exam limited by patient discomfort and body habitus. ? ?Musculoskeletal:  ?   Cervical back: Neck supple.  ?Skin: ?   General: Skin is warm and dry.  ?Neurological:  ?   Mental Status: She is alert and oriented to person, place, and time.  ? ? ? ?   ?Assessment & Plan:  ? ?Problem List Items Addressed This Visit   ? ?  ? Unprioritized  ? Pelvic pain  ?  Unclear etiology--check pelvic sono--could be  degenerating fibroid. ?Presumptive treatment for endometritis. ? ?  ?  ? Relevant Medications  ? doxycycline (VIBRAMYCIN) 100 MG capsule  ? Other Relevant Orders  ? US PELVIC COMPLETE WITH TRANSVAGINAL  ? ?Other Visit Diagnoses   ? ? Screening for malignant neoplasm of cervix    -  Primary  ? Check pap smear today  ? Relevant Orders  ? Cytology - PAP( [place])  ? ?  ? ? ? ?Return in 4 weeks (on 08/24/2021) for a follow-up with [facility]. ? ?Donnamae Jude, MD ?07/27/2021 ?8:40 AM ? ? ? ?

## 2021-07-27 NOTE — Assessment & Plan Note (Signed)
Unclear etiology--check pelvic sono--could be degenerating fibroid. ?Presumptive treatment for endometritis. ? ?

## 2021-07-28 ENCOUNTER — Telehealth: Payer: Self-pay

## 2021-07-28 NOTE — Telephone Encounter (Signed)
Patient has been scheduled at [facility] 6/19 start time 8:15 am. Need to arrive at 7:00am ?

## 2021-07-28 NOTE — Addendum Note (Signed)
Addended by: Cardell Peach I on: 07/28/2021 04:06 PM ? ? Modules accepted: Orders ? ?

## 2021-07-28 NOTE — Telephone Encounter (Signed)
-----   Message from Roetta Sessions, [place] sent at 07/27/2021  8:12 AM EDT ----- ?Regarding: RE: Screening colon [place] ?Yes, since he is not a screening and Dr. Doyne Keel patient you can add to June 19th. ? ?Thanks,  ?Jan ? ?----- Message ----- ?From: Cardell Peach I, CMA ?Sent: 07/24/2021  10:07 AM EDT ?To: Roetta Sessions, CMA ?Subject: FW: Screening colon [facility]                       ? ?Liborio Nixon says he is at [facility] 6/19, I don't see he has anyone on that day. This patient had a positive cologuard at pcp, so now it would be diagnostic and not routine screening correct? ?----- Message ----- ?From: Roetta Sessions, CMA ?Sent: 07/20/2021  12:35 PM EDT ?To: Eleanora Neighbor, CMA ?Subject: RE: Screening colon [facility]                       ? ?Will do. Thanks ? ? ?----- Message ----- ?From: Cardell Peach I, CMA ?Sent: 07/20/2021  12:00 AM EDT ?To: Roetta Sessions, [place], Eleanora Neighbor, CMA ?Subject: Screening colon [facility]                           ? ?Patient needs screening colonoscopy at [facility] due to BMI. Dr. Havery Moros currently does not have any spots open. ?Please let me know when one opens up so I can get her scheduled. ?Thank you. ? ? ? ? ?

## 2021-07-29 ENCOUNTER — Ambulatory Visit: Payer: BC Managed Care – PPO | Admitting: Obstetrics & Gynecology

## 2021-07-29 LAB — CYTOLOGY - PAP
Adequacy: ABSENT
Comment: NEGATIVE
Diagnosis: NEGATIVE
High risk HPV: NEGATIVE

## 2021-07-30 ENCOUNTER — Other Ambulatory Visit (HOSPITAL_BASED_OUTPATIENT_CLINIC_OR_DEPARTMENT_OTHER): Payer: BC Managed Care – PPO

## 2021-08-01 ENCOUNTER — Other Ambulatory Visit: Payer: Self-pay | Admitting: Cardiovascular Disease

## 2021-08-01 ENCOUNTER — Encounter: Payer: Self-pay | Admitting: Cardiovascular Disease

## 2021-08-03 MED ORDER — APIXABAN 5 MG PO TABS: ORAL_TABLET | ORAL | 3 refills | Status: DC

## 2021-08-03 NOTE — Telephone Encounter (Signed)
Prescription refill request for Eliquis received. ?Indication: PAD ?Last office visit:12/22 ?Scr:0.9 ?Age: 56 ?Weight:139.3 KG ? ?Prescription refilled ? ?

## 2021-08-06 ENCOUNTER — Ambulatory Visit (HOSPITAL_BASED_OUTPATIENT_CLINIC_OR_DEPARTMENT_OTHER)
Admission: RE | Admit: 2021-08-06 | Discharge: 2021-08-06 | Disposition: A | Discharge status: Home / Self Care | Payer: BC Managed Care – PPO | Source: Ambulatory Visit | Attending: Family Medicine | Admitting: Family Medicine

## 2021-08-06 DIAGNOSIS — R102 Pelvic and perineal pain: Secondary | ICD-10-CM

## 2021-08-08 ENCOUNTER — Ambulatory Visit (HOSPITAL_BASED_OUTPATIENT_CLINIC_OR_DEPARTMENT_OTHER)
Admission: RE | Admit: 2021-08-08 | Discharge: 2021-08-08 | Disposition: A | Discharge status: Home / Self Care | Payer: BC Managed Care – PPO | Source: Ambulatory Visit | Attending: Family Medicine | Admitting: Family Medicine

## 2021-08-08 DIAGNOSIS — N939 Abnormal uterine and vaginal bleeding, unspecified: Secondary | ICD-10-CM | POA: Diagnosis not present

## 2021-08-08 DIAGNOSIS — R102 Pelvic and perineal pain: Secondary | ICD-10-CM | POA: Diagnosis not present

## 2021-08-08 DIAGNOSIS — D259 Leiomyoma of uterus, unspecified: Secondary | ICD-10-CM | POA: Diagnosis not present

## 2021-08-09 ENCOUNTER — Encounter: Payer: Self-pay | Admitting: Family Medicine

## 2021-08-11 NOTE — Telephone Encounter (Signed)
Patient has not answered MyChart message. ? ?Called patient and left message for her to call back or respond via MyChart. ?

## 2021-08-14 ENCOUNTER — Other Ambulatory Visit: Payer: Self-pay

## 2021-08-14 ENCOUNTER — Telehealth: Payer: Self-pay

## 2021-08-14 DIAGNOSIS — D219 Benign neoplasm of connective and other soft tissue, unspecified: Secondary | ICD-10-CM

## 2021-08-14 DIAGNOSIS — N939 Abnormal uterine and vaginal bleeding, unspecified: Secondary | ICD-10-CM

## 2021-08-14 MED ORDER — SUTAB 1479-225-188 MG PO TABS: 1.0000 | ORAL_TABLET | ORAL | 0 refills | Status: DC

## 2021-08-14 MED ORDER — MEGESTROL ACETATE 40 MG PO TABS: 40.0000 mg | ORAL_TABLET | Freq: Every day | ORAL | 3 refills | Status: DC

## 2021-08-14 NOTE — Telephone Encounter (Signed)
Request for surgical clearance:     Endoscopy Procedure ? ?What type of surgery is being performed?     Colonoscopy ? ?When is this surgery scheduled?     10/19/21 ? ?What type of clearance is required ?   Pharmacy ? ?Are there any medications that need to be held prior to surgery and how long? Plavix 5 day and Eliquis 2 day hold ? ?Practice name and name of physician performing surgery?      [facility] ? ?What is your office phone and fax number?      Phone- [phone]  Fax- [phone] ? ?Anesthesia type (None, local, MAC, general) ?       [facility] ? ?

## 2021-08-14 NOTE — Telephone Encounter (Signed)
Sutab sent to pharmacy

## 2021-08-17 ENCOUNTER — Encounter: Payer: Self-pay | Admitting: Rheumatology

## 2021-08-17 NOTE — Telephone Encounter (Signed)
MD reply noted. Pending pharm as routed below. ?

## 2021-08-17 NOTE — Telephone Encounter (Signed)
? ?  Patient Name: Christina [name]  ?DOB: 11/06/1965 ?MRN: [id number] ? ?Primary Cardiologist: Mertie Moores, MD, [place] ? ?Chart reviewed as part of pre-operative protocol coverage.  ?Patient is on Plavix due to PAD with h/o penumbra asp thrombectomy, overlapping stents x2 to the R popliteal artery in 05/2018, PTA of the R anterior tibial and popliteal arteries 07/2019. Last PV visit 04/14/21 though note does not appear fully complete yet. 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 Looks like our office may be following as it is noted in last general cardiology note from 03/2021. ? ?Will route to Dr. Gwenlyn Found regarding holding of Plavix for 5 days, and pharm for input on holding Eliquis. Dr. Gwenlyn Found. - Please route response to P CV DIV PREOP (the pre-op pool). Thank you. ? ? ?Charlie Pitter, PA-C ?08/17/2021, 10:21 AM ? ? ?

## 2021-08-18 NOTE — Telephone Encounter (Signed)
Okay to get [place] labs and then schedule follow-up appointment

## 2021-08-18 NOTE — Telephone Encounter (Signed)
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   ? ?Procedure: colonoscopy ?Date of procedure: 10/19/21 ? ?CrCl 97 ?Platelet count 253 ? ?Per office protocol, patient can hold Eliquis for 2 days prior to procedure.   ?Patient will not need bridging with Lovenox (enoxaparin) around procedure. ? ?(Reviewed with Dr. Gwenlyn Found) ? ? ? ?

## 2021-08-19 NOTE — Telephone Encounter (Signed)
Pt has been advised about hold times for her medications. Hold Plavix x 5; and hold Eliquis x 2. Pt advised she will resume medications once Dr. Havery Moros feels it is safe from a bleeding standpoint. Pt thanked me for the call and verbalized understanding to plan of care. I will fax notes to requesting office.  ?

## 2021-08-19 NOTE — Telephone Encounter (Signed)
? ?  Name: Christina [name]  ?DOB: 02/29/1966  ?MRN: [id number]  ? ?Primary Cardiologist: Mertie Moores, MD ? ?Chart reviewed as part of pre-operative protocol coverage. We are asked for guidance to hold plavix and eliquis for upcoming colonoscopy.  ? ?Pt on plavix for history of PAD. Per Dr. Gwenlyn Found, may hold plavix for 5 days.  ? ?Per our clinical pharmacist: ?Patient with diagnosis of popliteal occlusion on Eliquis for anticoagulation.   ?  ?Procedure: colonoscopy ?Date of procedure: 10/19/21 ?  ?CrCl 97 ?Platelet count 253 ?  ?Per office protocol, patient can hold Eliquis for 2 days prior to procedure.   ?Patient will not need bridging with Lovenox (enoxaparin) around procedure. ? ? ?I will route this recommendation to the requesting party via Epic fax function and remove from pre-op pool. Please call with questions. ? ?Ledora Bottcher, PA ?08/19/2021, 8:36 AM ? ?

## 2021-08-28 IMAGING — US US PELVIS COMPLETE
1 series · 13 of 25 positions shown · non-contrast
Comparison: 10/19/2012

CLINICAL DATA: Abnormal uterine bleeding

EXAM:
TRANSABDOMINAL AND TRANSVAGINAL ULTRASOUND OF PELVIS
TECHNIQUE: Both transabdominal and transvaginal ultrasound examinations of the
pelvis were performed. Transabdominal technique was performed for
global imaging of the pelvis including uterus, ovaries, adnexal
regions, and pelvic cul-de-sac. It was necessary to proceed with
endovaginal exam following the transabdominal exam to visualize the
uterus, endometrium and ovaries.

[Series 1: us pelvis complete · 13 of 86 slices shown]
[im 1/86]
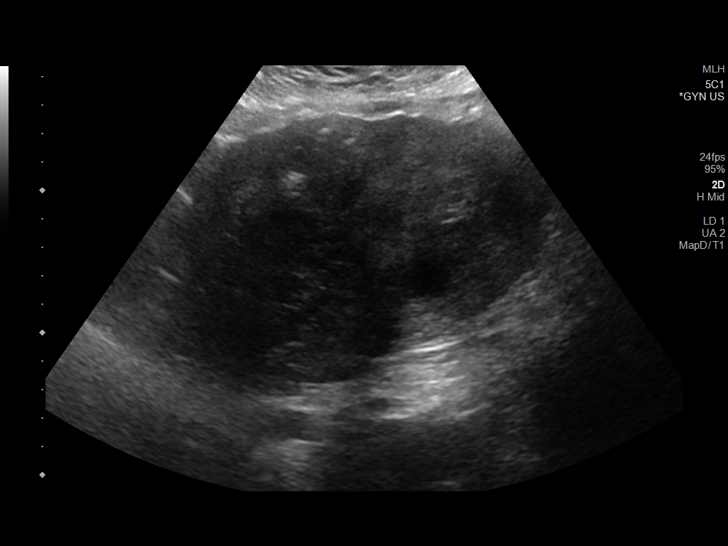
[im 8/86]
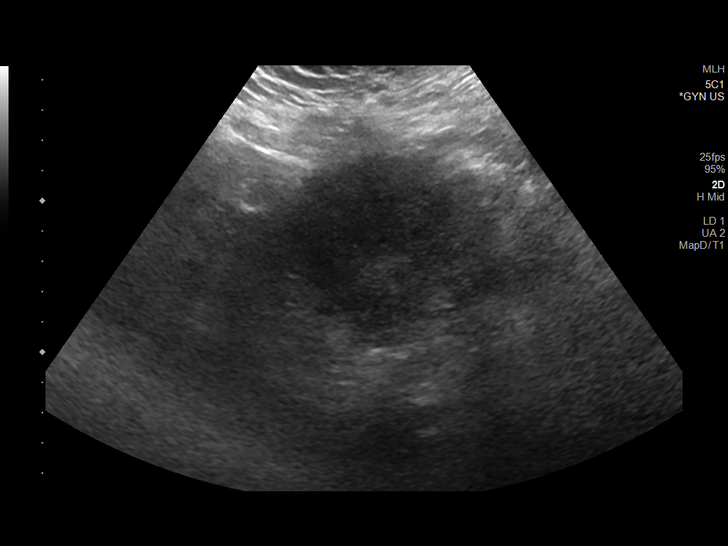
[im 15/86]
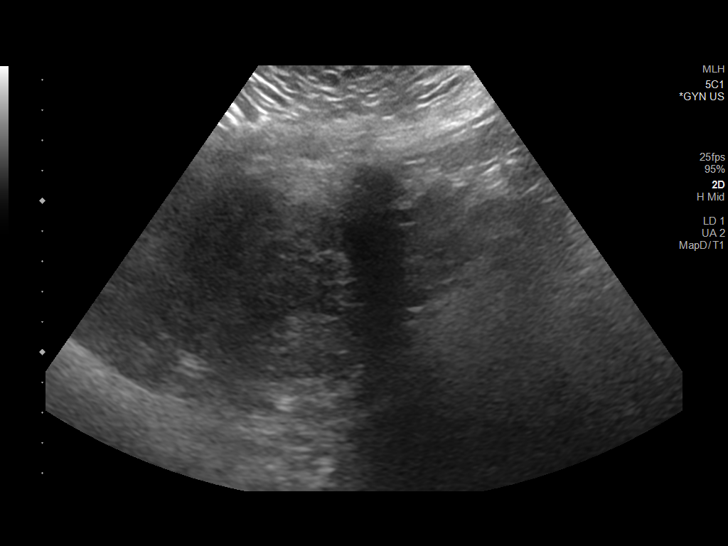
[im 22/86]
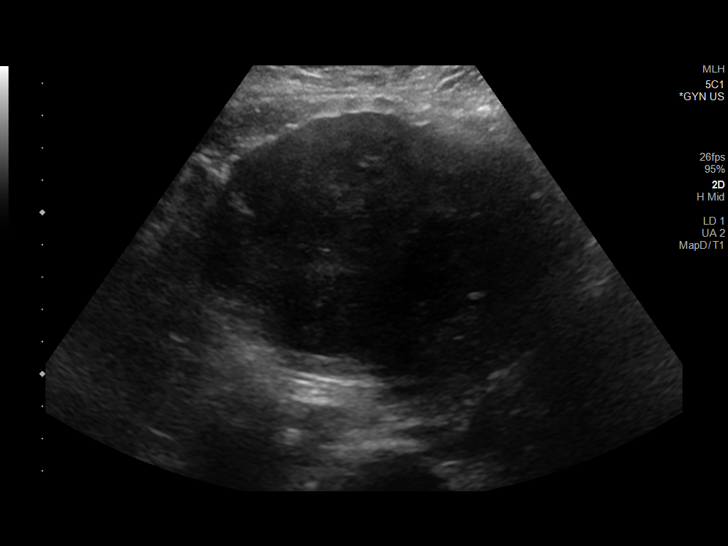
[im 29/86]
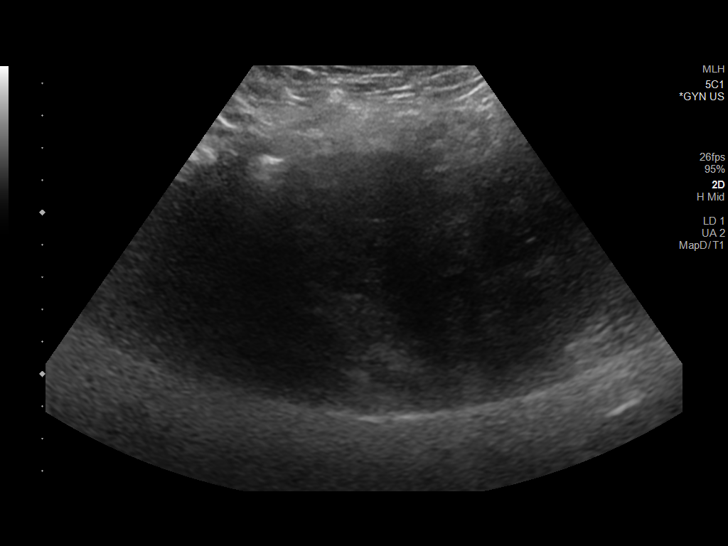
[im 36/86]
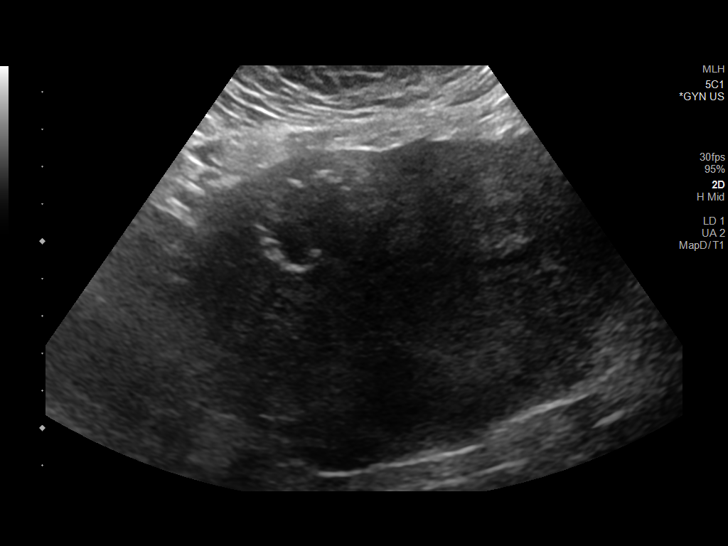
[im 43/86]
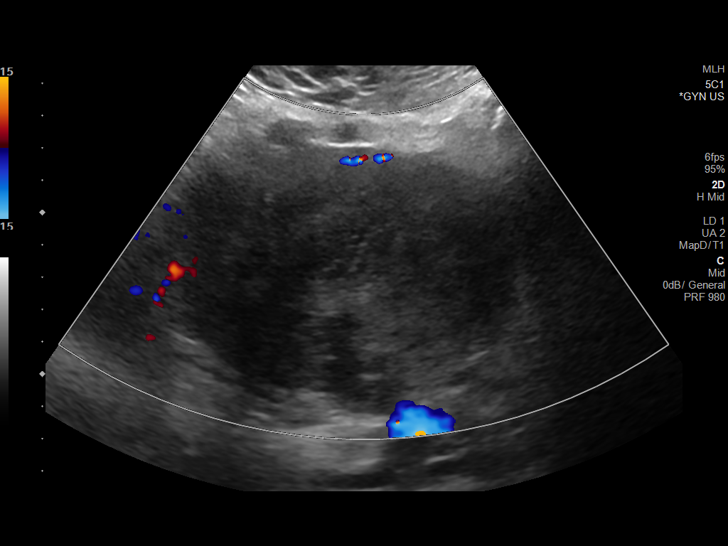
[im 50/86]
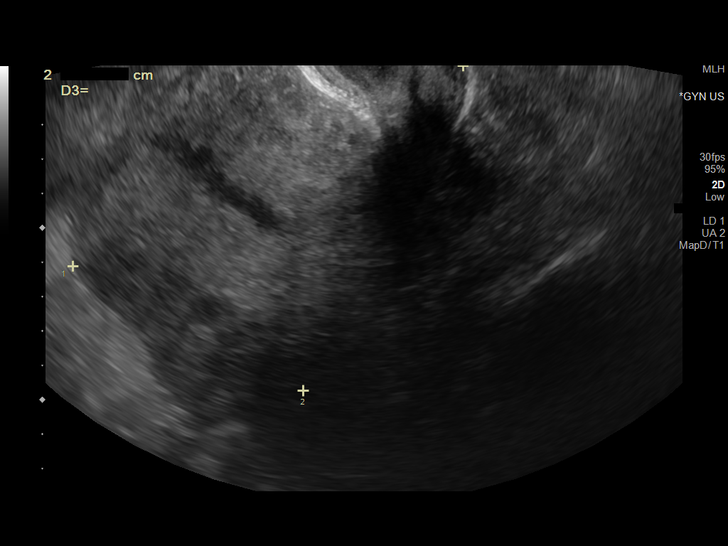
[im 57/86]
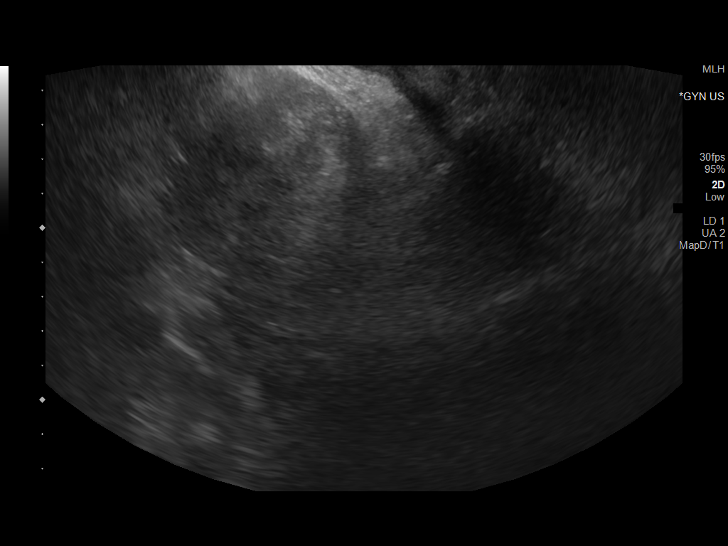
[im 64/86]
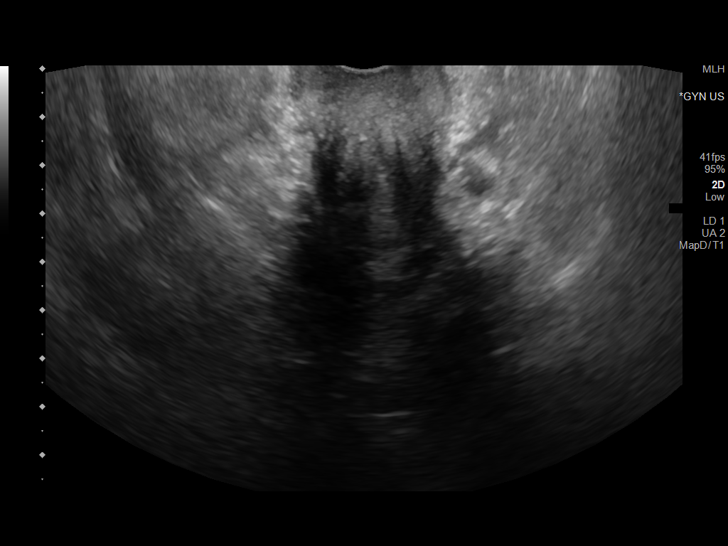
[im 71/86]
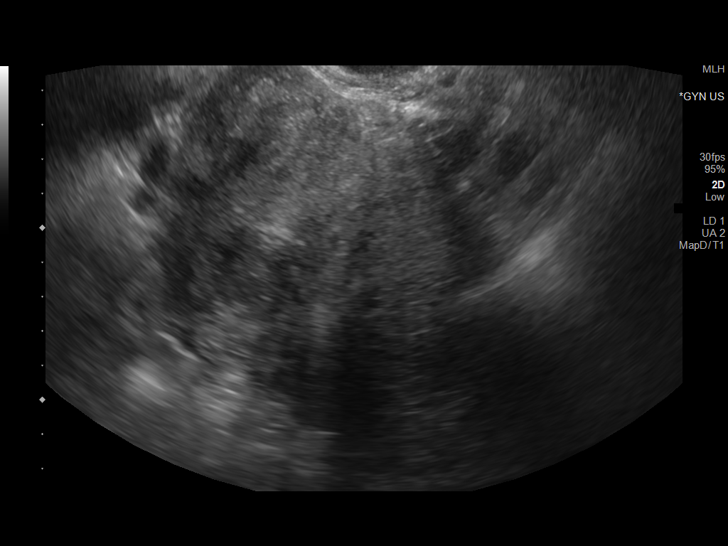
[im 78/86]
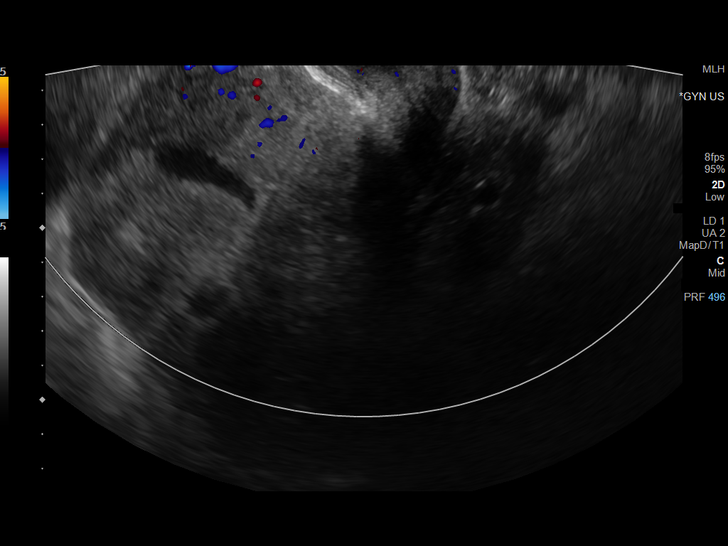
[im 86/86]
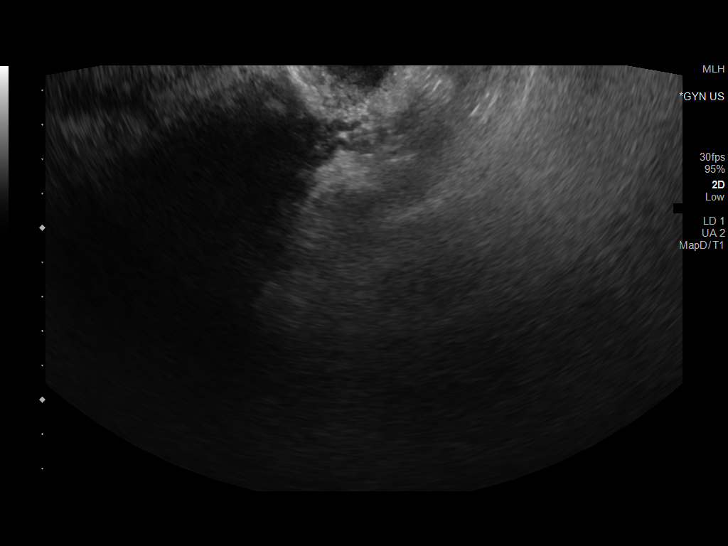

[13 of 25 positions shown; findings below may reference images not displayed]

FINDINGS: Uterus

Measurements: 14.5 x 9.3 x 11.3 cm = volume: 803 mL. Multiple
fibroids are identified.

Dominant fibroid arises from the left side of uterus measures 8.8 x
8.0 x 7.5 cm

Anterior fundal fibroid measures 2.8 x 2.7 x 2.5 cm.

Endometrium

Thickness: Endometrial stripe is not confidently identified. Fluid
distension of the fundal endometrial canal is identified. No focal
abnormality visualized.

Right ovary

Measurements: Not visualized.  No adnexal mass.

Left ovary

Measurements: Not visualized.  No adnexal mass noted.

Other findings

No abnormal free fluid.
IMPRESSION: 1. Enlarged fibroid uterus with a volume of approximately 803 cc.
The dominant fibroid arises from the left side of uterus with a
maximum dimension of 8.8 cm and may have mass effect upon the
endometrial cavity evident by fluid within the fundal portion of the
canal.
2. The endometrial stripe is not confidently identified and is
largely obscured secondary to multiple fibroids. If bleeding remains
unresponsive to hormonal or medical therapy, sonohysterogram should
be considered for focal lesion work-up. (Ref: Radiological
Reasoning: Algorithmic Workup of Abnormal Vaginal Bleeding with
Endovaginal Sonography and Sonohysterography. AJR [id number]; 191:S68-73)
3. Nonvisualization of the ovaries.

## 2021-09-04 DIAGNOSIS — M25572 Pain in left ankle and joints of left foot: Secondary | ICD-10-CM | POA: Diagnosis not present

## 2021-09-04 DIAGNOSIS — M79672 Pain in left foot: Secondary | ICD-10-CM | POA: Diagnosis not present

## 2021-09-16 ENCOUNTER — Ambulatory Visit: Payer: BC Managed Care – PPO | Admitting: Obstetrics & Gynecology

## 2021-10-02 ENCOUNTER — Encounter: Payer: Self-pay | Admitting: Rheumatology

## 2021-10-02 DIAGNOSIS — D8989 Other specified disorders involving the immune mechanism, not elsewhere classified: Secondary | ICD-10-CM | POA: Diagnosis not present

## 2021-10-02 DIAGNOSIS — R768 Other specified abnormal immunological findings in serum: Secondary | ICD-10-CM | POA: Diagnosis not present

## 2021-10-06 ENCOUNTER — Encounter: Payer: Self-pay | Admitting: Cardiovascular Disease

## 2021-10-06 ENCOUNTER — Ambulatory Visit (HOSPITAL_COMMUNITY)
Admission: RE | Admit: 2021-10-06 | Discharge: 2021-10-06 | Disposition: A | Discharge status: Home / Self Care | Payer: BC Managed Care – PPO | Source: Ambulatory Visit | Attending: Cardiovascular Disease | Admitting: Cardiovascular Disease

## 2021-10-06 ENCOUNTER — Ambulatory Visit (INDEPENDENT_AMBULATORY_CARE_PROVIDER_SITE_OTHER): Payer: BC Managed Care – PPO | Admitting: Cardiovascular Disease

## 2021-10-06 ENCOUNTER — Telehealth: Payer: Self-pay

## 2021-10-06 VITALS — BP 140/80 | Ht 65.0 in | Wt 312.6 lb

## 2021-10-06 DIAGNOSIS — I739 Peripheral vascular disease, unspecified: Secondary | ICD-10-CM | POA: Diagnosis not present

## 2021-10-06 DIAGNOSIS — E782 Mixed hyperlipidemia: Secondary | ICD-10-CM | POA: Diagnosis not present

## 2021-10-06 DIAGNOSIS — I5032 Chronic diastolic (congestive) heart failure: Secondary | ICD-10-CM

## 2021-10-06 NOTE — Assessment & Plan Note (Signed)
History of peripheral arterial disease status post complex peripheral intervention performed by myself 05/08/2018 with an occluded popliteal artery extending into the tibioperoneal trunk.  I did penumbra aspiration thrombectomy followed by PTA and stenting using 2 overlapping Tigris self-expanding stents beginning in the popliteal artery extending down into the anterior tibial artery.  Unfortunately, she developed in-stent restenosis and I reangiogram to her 07/12/2019 revealing 80% in-stent restenosis.  I performed drug-coated balloon angioplasty with an excellent result.  Since that time she had progressive restenosis with an ABI that is gradually decreased from 0.90-0.80 with moderate in-stent restenosis although she really denies claudication.

## 2021-10-06 NOTE — Telephone Encounter (Signed)
-----   Message from Lavonia Drafts, MD sent at 09/30/2021  1:15 PM EDT ----- Please call pt re: Cologard. She needs to complete hers.   Thx,  Clh-S

## 2021-10-06 NOTE — Assessment & Plan Note (Signed)
2D echo performed 03/31/2021 revealed preserved LV function with grade 1 diastolic dysfunction.  She is on oral furosemide.

## 2021-10-06 NOTE — Assessment & Plan Note (Signed)
History of hyperlipidemia on Zetia with lipid profile performed by her PCP 02/21/2021 revealing total cholesterol 164, LDL of 85 and HDL of 50.  It should be noted that I did do a coronary CTA on her which revealed a coronary calcium score of 0.

## 2021-10-06 NOTE — Patient Instructions (Signed)
Medication Instructions:  Your physician recommends that you continue on your current medications as directed. Please refer to the Current Medication list given to you today.  *If you need a refill on your cardiac medications before your next appointment, please call your pharmacy*   Testing/Procedures: Your physician has requested that you have a lower extremity arterial duplex. This test is an ultrasound of the arteries in the legs. It looks at arterial blood flow in the legs. Allow one hour for Lower Arterial scans. There are no restrictions or special instructions  Your physician has requested that you have an ankle brachial index (ABI). During this test an ultrasound and blood pressure cuff are used to evaluate the arteries that supply the arms and legs with blood. Allow thirty minutes for this exam. There are no restrictions or special instructions. To be done in June 2024. These procedures will be done at [place]. [address]   Follow-Up: At [facility], you and your health needs are our priority.  As part of our continuing mission to provide you with exceptional heart care, we have created designated Provider Care Teams.  These Care Teams include your primary Cardiologist (physician) and Advanced Practice Providers (APPs -  Physician Assistants and Nurse Practitioners) who all work together to provide you with the care you need, when you need it.  We recommend signing up for the patient portal called "MyChart".  Sign up information is provided on this After Visit Summary.  MyChart is used to connect with patients for Virtual Visits (Telemedicine).  Patients are able to view lab/test results, encounter notes, upcoming appointments, etc.  Non-urgent messages can be sent to your provider as well.   To learn more about what you can do with MyChart, go to [email or web address].    Your next appointment:   12 month(s)  The format for your next appointment:   In  Person  Provider:   Quay Burow, MD

## 2021-10-06 NOTE — Progress Notes (Signed)
10/06/2021 Christina [name]   10-25-1965  [id number]  Primary Physician Pcp, No Primary Cardiologist: Lorretta Harp MD Renae Gloss  HPI:  Christina [name] is a 56 y.o.  severely overweight single African-American female mother of 1 child, grandmother and 3 grandchildren who works Chief Operating Officer at [employer].  She was referred by Dr. Acie Fredrickson for peripheral vascular evaluation because of  right calf claudication which was lifestyle limiting.  I last saw her in the office 10/08/2020. She has a history of hypertension.  She has chronic chest pain with a recent negative Myoview and a negative coronary CTA 1 year ago.  She had fairly recent onset right calf pain approxi-1 month ago that occurred when she woke up 1 morning and has been fairly persistent with ambulation.  She had [name] extremity arterial Doppler studies performed 04/28/2018 revealing a right ABI 0.61 with an occluded right popliteal artery.   I performed peripheral angiography 05/08/2018 revealing occluded above-knee popliteal artery extending down below the knee into the proximal anterior tibial and tibioperoneal trunk.  She had a long complex procedure with penumbra aspiration thrombectomy followed by balloon angioplasty and implantation of 2 overlapping Tigris self-expanding stents beginning in the popliteal artery extending down into the anterior tibial artery.  Unfortunately her tibioperoneal trunk remains occluded.  Her Dopplers normalized and her pain is resolved.  She did unfortunately have a left common femoral pseudoaneurysm and ultimately underwent ultrasound-guided thrombin injection and successfully was closed.   She is on Eliquis and clopidogrel.  Dopplers performed 05/26/2018 revealed a right ABI of 1.24 with a patent stent.  She does continue to complain of atypical chest pain beginning under her right breast and going into her neck and jaw.  She had a negative Myoview stress test in December of last year and a  coronary CTA revealing a coronary calcium score of 0 with normal coronary arteries.  In addition, she is taken sublingual nitroglycerin for this which does not work.  I reassured her that I do not think this is related to coronary artery disease.   She continues to have right calf claudication similar to her preintervention symptoms as well as numbness of her toes.  She describes recurrent claudication over the last 2 months when walking in [place].  Her most recent Dopplers show decline in her right ABI from 1.24 down to 0.93 with a moderate increase in the velocities within the tibioperoneal stent.  Based on this we decided to proceed with outpatient angiography   I performed peripheral angiography on her via the left femoral approach 07/12/2019 revealing 80% "in-stent restenosis within the popliteal and tibioperoneal stent.  Performed drug-coated balloon angioplasty with excellent result.  Her claudication has resolved and her Dopplers are normalized.  She is back on triple therapy for 1 month after which aspirin will be discontinued.   Since I saw her in the office 6 months ago she continues to do well.  She denies claudication.  Her ABIs have gone down on the right from 0.9 and 0.8.  She has a moderately elevated signal in her popliteal artery stent but she denies claudication.  She is scheduled for colonoscopy by Dr. Havery Moros in the upcoming future.   Current Meds  Medication Sig   apixaban (ELIQUIS) 5 MG TABS tablet TAKE 1 TABLET(5 MG) BY MOUTH TWICE DAILY   baclofen (LIORESAL) 10 MG tablet Take 10 mg by mouth 3 (three) times daily.   carvedilol (COREG) 6.25 MG tablet Take  6.25 mg by mouth 2 (two) times daily with a meal.   cetirizine (ZYRTEC) 10 MG tablet Take 10 mg by mouth daily.   Cholecalciferol (VITAMIN D3) 50 MCG (2000 UT) CAPS Take by mouth.   clotrimazole (LOTRIMIN) 1 % external solution Apply 1 application. topically 2 (two) times daily.   doxycycline (VIBRAMYCIN) 100 MG capsule  Take 100 mg by mouth 2 (two) times daily.   ezetimibe (ZETIA) 10 MG tablet Take 1 tablet by mouth daily.   furosemide (LASIX) 20 MG tablet Take by mouth.   irbesartan (AVAPRO) 150 MG tablet Take 150 mg by mouth daily.   megestrol (MEGACE) 40 MG tablet Take 1 tablet (40 mg total) by mouth daily in the afternoon.   metFORMIN (GLUCOPHAGE-XR) 500 MG 24 hr tablet Take 1 tablet by mouth daily with breakfast.   potassium chloride (KLOR-CON) 10 MEQ tablet Take 1 tablet by mouth daily.   Sodium Sulfate-Mag Sulfate-KCl (SUTAB) [phone] MG TABS Take 1 kit by mouth as directed.   terbinafine (LAMISIL) 250 MG tablet Take 250 mg by mouth daily.   triamcinolone lotion (KENALOG) 0.1 % Apply 1 application. topically daily as needed.   vitamin B-12 (CYANOCOBALAMIN) 1000 MCG tablet Take 1,000 mcg by mouth daily in the afternoon.      Allergies  Allergen Reactions   Lipitor [Atorvastatin]     Other reaction(s): Other (See Comments) Myalgias   Lisinopril Cough    Social History   Socioeconomic History   Marital status: Single    Spouse name: Not on file   Number of children: 1   Years of education: two years of college   Highest education level: Not on file  Occupational History   Not on file  Tobacco Use   Smoking status: Never   Smokeless tobacco: Never  Vaping Use   Vaping Use: Never used  Substance and Sexual Activity   Alcohol use: No   Drug use: No   Sexual activity: Yes  Other Topics Concern   Not on file  Social History Narrative   Lives alone.   Right-handed.   No daily use of caffeine.   Social Determinants of Health   Financial Resource Strain: Not on file  Food Insecurity: Not on file  Transportation Needs: Not on file  Physical Activity: Not on file  Stress: Not on file  Social Connections: Not on file  Intimate Partner Violence: Not on file     Review of Systems: General: negative for chills, fever, night sweats or weight changes.  Cardiovascular: negative for  chest pain, dyspnea on exertion, edema, orthopnea, palpitations, paroxysmal nocturnal dyspnea or shortness of breath Dermatological: negative for rash Respiratory: negative for cough or wheezing Urologic: negative for hematuria Abdominal: negative for nausea, vomiting, diarrhea, bright red blood per rectum, melena, or hematemesis Neurologic: negative for visual changes, syncope, or dizziness All other systems reviewed and are otherwise negative except as noted above.    Blood pressure 140/80, height 5' 5" (1.651 m), weight (!) 312 lb 9.6 oz (141.8 kg).  General appearance: alert and no distress Neck: no adenopathy, no carotid bruit, no JVD, supple, symmetrical, trachea midline, and thyroid not enlarged, symmetric, no tenderness/mass/nodules Lungs: clear to auscultation bilaterally Heart: regular rate and rhythm, S1, S2 normal, no murmur, click, rub or gallop Extremities: extremities normal, atraumatic, no cyanosis or edema Pulses: 2+ and symmetric Skin: Skin color, texture, turgor normal. No rashes or lesions Neurologic: Grossly normal  EKG not performed today  ASSESSMENT AND PLAN:  Peripheral arterial disease ([place]) History of peripheral arterial disease status post complex peripheral intervention performed by myself 05/08/2018 with an occluded popliteal artery extending into the tibioperoneal trunk.  I did penumbra aspiration thrombectomy followed by PTA and stenting using 2 overlapping Tigris self-expanding stents beginning in the popliteal artery extending down into the anterior tibial artery.  Unfortunately, she developed in-stent restenosis and I reangiogram to her 07/12/2019 revealing 80% in-stent restenosis.  I performed drug-coated balloon angioplasty with an excellent result.  Since that time she had progressive restenosis with an ABI that is gradually decreased from 0.90-0.80 with moderate in-stent restenosis although she really denies claudication.  Mixed hyperlipidemia History  of hyperlipidemia on Zetia with lipid profile performed by her PCP 02/21/2021 revealing total cholesterol 164, LDL of 85 and HDL of 50.  It should be noted that I did do a coronary CTA on her which revealed a coronary calcium score of 0.  Chronic heart failure with preserved ejection fraction ([place]) 2D echo performed 03/31/2021 revealed preserved LV function with grade 1 diastolic dysfunction.  She is on oral furosemide.     Lorretta Harp MD FACP,FACC,FAHA, FSCAI 10/06/2021 4:00 PM

## 2021-10-06 NOTE — Telephone Encounter (Signed)
Left message for patient to return call to office. Bran Aldridge  RN 

## 2021-10-07 ENCOUNTER — Encounter (HOSPITAL_COMMUNITY): Payer: Self-pay | Admitting: Gastroenterology

## 2021-10-09 DIAGNOSIS — Z0000 Encounter for general adult medical examination without abnormal findings: Secondary | ICD-10-CM | POA: Diagnosis not present

## 2021-10-09 DIAGNOSIS — Z130 Encounter for screening for diseases of the blood and blood-forming organs and certain disorders involving the immune mechanism: Secondary | ICD-10-CM | POA: Diagnosis not present

## 2021-10-09 DIAGNOSIS — Z1329 Encounter for screening for other suspected endocrine disorder: Secondary | ICD-10-CM | POA: Diagnosis not present

## 2021-10-09 DIAGNOSIS — Z13220 Encounter for screening for lipoid disorders: Secondary | ICD-10-CM | POA: Diagnosis not present

## 2021-10-09 DIAGNOSIS — E559 Vitamin D deficiency, unspecified: Secondary | ICD-10-CM | POA: Diagnosis not present

## 2021-10-09 DIAGNOSIS — Z13228 Encounter for screening for other metabolic disorders: Secondary | ICD-10-CM | POA: Diagnosis not present

## 2021-10-09 DIAGNOSIS — R7303 Prediabetes: Secondary | ICD-10-CM | POA: Diagnosis not present

## 2021-10-18 NOTE — Anesthesia Preprocedure Evaluation (Signed)
Anesthesia Evaluation  Patient identified by MRN, date of birth, ID band Patient awake    Reviewed: Allergy & Precautions, NPO status , Patient's Chart, lab work & pertinent test results  Airway Mallampati: II  TM Distance: >3 FB Neck ROM: Full    Dental  (+) Upper Dentures, Lower Dentures   Pulmonary neg pulmonary ROS,    Pulmonary exam normal        Cardiovascular hypertension, Pt. on medications and Pt. on home beta blockers + Peripheral Vascular Disease   Rhythm:Regular Rate:Normal     Neuro/Psych negative neurological ROS  negative psych ROS   GI/Hepatic Neg liver ROS, PUD, GERD  ,Positive cologuard   Endo/Other  negative endocrine ROS  Renal/GU negative Renal ROS  negative genitourinary   Musculoskeletal  (+) Arthritis , Osteoarthritis,    Abdominal Normal abdominal exam  (+)   Peds  Hematology  (+) Blood dyscrasia, anemia ,   Anesthesia Other Findings   Reproductive/Obstetrics                           Anesthesia Physical Anesthesia Plan  ASA: 3  Anesthesia Plan: MAC   Post-op Pain Management:    Induction: Intravenous  PONV Risk Score and Plan: 2 and Propofol infusion and Treatment may vary due to age or medical condition  Airway Management Planned: Simple Face Mask, Natural Airway and Nasal Cannula  Additional Equipment: None  Intra-op Plan:   Post-operative Plan:   Informed Consent: I have reviewed the patients History and Physical, chart, labs and discussed the procedure including the risks, benefits and alternatives for the proposed anesthesia with the patient or authorized representative who has indicated his/her understanding and acceptance.     Dental advisory given  Plan Discussed with: CRNA  Anesthesia Plan Comments: (Lab Results      Component                Value               Date                      WBC                      7.1                  03/13/2021                HGB                      13.3                03/13/2021                HCT                      39.9                03/13/2021                MCV                      91                  03/13/2021                PLT  253                 03/13/2021           Lab Results      Component                Value               Date                      NA                       138                 07/13/2019                K                        3.9                 07/13/2019                CO2                      22                  07/13/2019                GLUCOSE                  104 (H)             07/13/2019                BUN                      13                  07/13/2019                CREATININE               0.89                07/13/2019                CALCIUM                  9.0                 07/13/2019                GFRNONAA                 >60                 07/13/2019          )       Anesthesia Quick Evaluation

## 2021-10-19 ENCOUNTER — Encounter (HOSPITAL_COMMUNITY): Payer: Self-pay | Admitting: Gastroenterology

## 2021-10-19 ENCOUNTER — Ambulatory Visit (HOSPITAL_COMMUNITY)
Admission: RE | Admit: 2021-10-19 | Discharge: 2021-10-19 | Disposition: A | Discharge status: Home / Self Care | Payer: BC Managed Care – PPO | Attending: Gastroenterology | Admitting: Gastroenterology

## 2021-10-19 ENCOUNTER — Ambulatory Visit (HOSPITAL_COMMUNITY): Payer: BC Managed Care – PPO | Admitting: Anesthesiology

## 2021-10-19 ENCOUNTER — Other Ambulatory Visit: Payer: Self-pay

## 2021-10-19 ENCOUNTER — Encounter (HOSPITAL_COMMUNITY)
Admission: RE | Disposition: A | Discharge status: Home / Self Care | Payer: Self-pay | Source: Home / Self Care | Attending: Gastroenterology

## 2021-10-19 DIAGNOSIS — K219 Gastro-esophageal reflux disease without esophagitis: Secondary | ICD-10-CM | POA: Insufficient documentation

## 2021-10-19 DIAGNOSIS — D127 Benign neoplasm of rectosigmoid junction: Secondary | ICD-10-CM | POA: Diagnosis not present

## 2021-10-19 DIAGNOSIS — D126 Benign neoplasm of colon, unspecified: Secondary | ICD-10-CM | POA: Diagnosis not present

## 2021-10-19 DIAGNOSIS — D6859 Other primary thrombophilia: Secondary | ICD-10-CM | POA: Insufficient documentation

## 2021-10-19 DIAGNOSIS — M1990 Unspecified osteoarthritis, unspecified site: Secondary | ICD-10-CM | POA: Diagnosis not present

## 2021-10-19 DIAGNOSIS — Z6841 Body Mass Index (BMI) 40.0 and over, adult: Secondary | ICD-10-CM | POA: Insufficient documentation

## 2021-10-19 DIAGNOSIS — K648 Other hemorrhoids: Secondary | ICD-10-CM | POA: Diagnosis not present

## 2021-10-19 DIAGNOSIS — Z1211 Encounter for screening for malignant neoplasm of colon: Secondary | ICD-10-CM | POA: Diagnosis not present

## 2021-10-19 DIAGNOSIS — Z95820 Peripheral vascular angioplasty status with implants and grafts: Secondary | ICD-10-CM | POA: Insufficient documentation

## 2021-10-19 DIAGNOSIS — D122 Benign neoplasm of ascending colon: Secondary | ICD-10-CM | POA: Insufficient documentation

## 2021-10-19 DIAGNOSIS — K635 Polyp of colon: Secondary | ICD-10-CM | POA: Diagnosis not present

## 2021-10-19 DIAGNOSIS — K5730 Diverticulosis of large intestine without perforation or abscess without bleeding: Secondary | ICD-10-CM | POA: Insufficient documentation

## 2021-10-19 DIAGNOSIS — I739 Peripheral vascular disease, unspecified: Secondary | ICD-10-CM | POA: Insufficient documentation

## 2021-10-19 DIAGNOSIS — Z800 Family history of malignant neoplasm of digestive organs: Secondary | ICD-10-CM | POA: Diagnosis not present

## 2021-10-19 DIAGNOSIS — I10 Essential (primary) hypertension: Secondary | ICD-10-CM | POA: Diagnosis not present

## 2021-10-19 DIAGNOSIS — Z8711 Personal history of peptic ulcer disease: Secondary | ICD-10-CM | POA: Insufficient documentation

## 2021-10-19 DIAGNOSIS — R195 Other fecal abnormalities: Secondary | ICD-10-CM

## 2021-10-19 DIAGNOSIS — Z8379 Family history of other diseases of the digestive system: Secondary | ICD-10-CM | POA: Diagnosis not present

## 2021-10-19 HISTORY — PX: POLYPECTOMY: SHX5525

## 2021-10-19 HISTORY — PX: COLONOSCOPY WITH PROPOFOL: SHX5780

## 2021-10-19 LAB — GLUCOSE, CAPILLARY: Glucose-Capillary: 107 mg/dL — ABNORMAL HIGH (ref 70–99)

## 2021-10-19 SURGERY — COLONOSCOPY WITH PROPOFOL
Anesthesia: Monitor Anesthesia Care

## 2021-10-19 MED ORDER — LACTATED RINGERS IV SOLN
INTRAVENOUS | Status: AC | PRN
  Administered 2021-10-19: 10 mL/h via INTRAVENOUS

## 2021-10-19 MED ORDER — PROPOFOL 10 MG/ML IV BOLUS
INTRAVENOUS | Status: DC | PRN
  Administered 2021-10-19: 30 mg via INTRAVENOUS

## 2021-10-19 MED ORDER — SODIUM CHLORIDE 0.9 % IV SOLN: INTRAVENOUS | Status: DC

## 2021-10-19 MED ORDER — PROPOFOL 500 MG/50ML IV EMUL
INTRAVENOUS | Status: DC | PRN
  Administered 2021-10-19: 125 ug/kg/min via INTRAVENOUS

## 2021-10-19 MED ORDER — PROPOFOL 500 MG/50ML IV EMUL
INTRAVENOUS | Status: AC
  Filled 2021-10-19: qty 50

## 2021-10-19 SURGICAL SUPPLY — 22 items

## 2021-10-19 NOTE — Op Note (Signed)
[facility] Patient Name: Christina [name] Procedure Date: 10/19/2021 MRN: [id number] Attending MD: Carlota Raspberry. Havery Moros , MD Date of Birth: 1966-04-19 CSN: [id number] Age: 56 Admit Type: Outpatient Procedure:                Colonoscopy Indications:              Screening in patient at increased risk: Family                            history of 1st-degree relative with colorectal                            cancer (mother age 62s) This is the patient's first                            colonoscopy Providers:                Remo Lipps P. Havery Moros, MD, Benay Pillow, RN,                            Gloris Ham, Technician Referring MD:              Medicines:                Monitored Anesthesia Care Complications:            No immediate complications. Estimated blood loss:                            Minimal. Estimated Blood Loss:     Estimated blood loss was minimal. Procedure:                Pre-Anesthesia Assessment:                           - Prior to the procedure, a History and Physical                            was performed, and patient medications and                            allergies were reviewed. The patient's tolerance of                            previous anesthesia was also reviewed. The risks                            and benefits of the procedure and the sedation                            options and risks were discussed with the patient.                            All questions were answered, and informed consent                            was obtained. Prior Anticoagulants:  The patient has                            taken Eliquis (apixaban), last dose was 2 days                            prior to procedure, Plavix taken 5 days prior to                            the exam. ASA Grade Assessment: III - A patient                            with severe systemic disease. After reviewing the                            risks and benefits, the patient  was deemed in                            satisfactory condition to undergo the procedure.                           After obtaining informed consent, the colonoscope                            was passed under direct vision. Throughout the                            procedure, the patient's blood pressure, pulse, and                            oxygen saturations were monitored continuously. The                            CF-HQ190L ([id number]) Olympus colonoscope was                            introduced through the anus and advanced to the the                            cecum, identified by appendiceal orifice and                            ileocecal valve. The colonoscopy was performed                            without difficulty. The patient tolerated the                            procedure well. The quality of the bowel                            preparation was adequate. The ileocecal valve,  appendiceal orifice, and rectum were photographed. Scope In: 8:21:59 AM Scope Out: 8:48:51 AM Scope Withdrawal Time: 0 hours 20 minutes 46 seconds  Total Procedure Duration: 0 hours 26 minutes 52 seconds  Findings:      The perianal and digital rectal examinations were normal.      Three flat and sessile polyps were found in the ascending colon. The       polyps were 3 to 5 mm in size. These polyps were removed with a cold       snare. Resection and retrieval were complete.      A 5 mm polyp was found in the recto-sigmoid colon. The polyp was       sessile. The polyp was removed with a cold snare. Resection and       retrieval were complete.      Scattered medium-mouthed diverticula were found in the entire colon.      Internal hemorrhoids were found during retroflexion.      The exam was otherwise without abnormality. Impression:               - Three 3 to 5 mm polyps in the ascending colon,                            removed with a cold snare. Resected and  retrieved.                           - One 5 mm polyp at the recto-sigmoid colon,                            removed with a cold snare. Resected and retrieved.                           - Diverticulosis in the entire examined colon.                           - Internal hemorrhoids.                           - The examination was otherwise normal. Moderate Sedation:      No moderate sedation, case performed with MAC Recommendation:           - Patient has a contact number available for                            emergencies. The signs and symptoms of potential                            delayed complications were discussed with the                            patient. Return to normal activities tomorrow.                            Written discharge instructions were provided to the                            patient.                           -  Resume previous diet.                           - Continue present medications.                           - Resume Eliquis and Plavix tomorrow                           - Await pathology results. Procedure Code(s):        --- Professional ---                           [phone], Colonoscopy, flexible; with removal of                            tumor(s), polyp(s), or other lesion(s) by snare                            technique Diagnosis Code(s):        --- Professional ---                           K63.5, Polyp of colon                           Z80.0, Family history of malignant neoplasm of                            digestive organs                           K64.8, Other hemorrhoids                           K57.30, Diverticulosis of large intestine without                            perforation or abscess without bleeding CPT copyright 2019 [employer]. All rights reserved. The codes documented in this report are preliminary and upon coder review may  be revised to meet current compliance requirements. Remo Lipps P. Corwin Kuiken,  MD 10/19/2021 8:55:38 AM This report has been signed electronically. Number of Addenda: 0

## 2021-10-19 NOTE — Anesthesia Procedure Notes (Signed)
Procedure Name: MAC Date/Time: 10/19/2021 8:18 AM  Performed by: Lollie Sails, CRNAPre-anesthesia Checklist: Patient identified, Emergency Drugs available, Suction available, Patient being monitored and Timeout performed Oxygen Delivery Method: Simple face mask Placement Confirmation: positive ETCO2

## 2021-10-19 NOTE — Transfer of Care (Signed)
Immediate Anesthesia Transfer of Care Note  Patient: Christina [name]  Procedure(s) Performed: COLONOSCOPY WITH PROPOFOL POLYPECTOMY  Patient Location: PACU and Endoscopy Unit  Anesthesia Type:MAC  Level of Consciousness: awake, drowsy and responds to stimulation  Airway & Oxygen Therapy: Patient Spontanous Breathing and Patient connected to face mask oxygen  Post-op Assessment: Report given to RN, Post -op Vital signs reviewed and stable and BP 132/83  Post vital signs: Reviewed and stable  Last Vitals:  Vitals Value Taken Time  BP    Temp    Pulse 83 10/19/21 0856  Resp 30 10/19/21 0856  SpO2 100 % 10/19/21 0856  Vitals shown include unvalidated device data.  Last Pain:  Vitals:   10/19/21 0733  TempSrc: Temporal  PainSc: 0-No pain         Complications: No notable events documented.

## 2021-10-19 NOTE — Progress Notes (Signed)
Virtual Visit via Telephone Note  I connected with Christina Christina [name] on 10/19/21 at  3:20 PM EDT by telephone and verified that I am speaking with the correct person using two identifiers.  Location: Patient: At home Provider: In the office   I discussed the limitations, risks, security and privacy concerns of performing an evaluation and management service by telephone and the availability of in person appointments. I also discussed with the patient that there may be a patient responsible charge related to this service. The patient expressed understanding and agreed to proceed.  Patient reports morning stiffness for 0  none .   Patient reports nocturnal pain.  Difficulty dressing/grooming: Denies Difficulty climbing stairs: Denies Difficulty getting out of chair: Denies Difficulty using hands for taps, buttons, cutlery, and/or writing: Denies   History of Present Illness: Christina Christina [name] is 56 years old female with history of positive ANA and positive rheumatoid factor.  She denies any history of oral ulcers, nasal ulcers, malar rash, photosensitivity, Raynaud's phenomenon, sicca symptoms, inflammatory arthritis.  She continues to have muscle cramps.  She has tried different over-the-counter supplements without results.  She states she had extensive work-up without results.  She continues to have some numbness in her upper and Christina [name] extremities.  She states the numbness in the Christina [name] extremities has improved.  She is followed by neurologist.  She has been going to oncology on a regular basis for MGUS.  She denies any discomfort in her knee joints in her neck.  She states that she has poor circulation in her feet for which she has been seen by Dr. Alvester Chou recently.  Review of Systems  Constitutional:  Negative for malaise/fatigue.  HENT:  Negative for congestion.   Eyes:  Negative for pain.  Respiratory:  Negative for shortness of breath.   Cardiovascular:  Negative for chest pain.   Gastrointestinal:  Negative for diarrhea.  Genitourinary:  Negative for frequency.  Musculoskeletal:  Negative for joint pain.  Skin:  Negative for rash.  Neurological:  Negative for weakness.  Endo/Heme/Allergies:  Does not bruise/bleed easily.  Psychiatric/Behavioral:  The patient is not nervous/anxious.     Observations/Objective: Physical exam: Neurological: Mental status: Alert and oriented to person place and time. Psychiatric: Mood and affect normal, cognition and memory normal, judgment normal     Assessment and Plan:  Visit Diagnoses: Positive ANA (antinuclear antibody) - Patient denies any history of oral ulcers, nasal ulcers, malar rash, photosensitivity, Raynaud's phenomenon or inflammatory arthritis.  She denies any history of shortness of breath or palpitations.  There is no history of hair loss or lymphadenopathy.  She wanted to have repeat labs to check her ANA titer.  We obtain recent labs which were reviewed today.  Her ANA titer has improved and ENA panel was completely negative.  Rheumatoid factor titers have improved. October 02, 2021 AVISE Lupus index -1.8, ANA 1: 80 speckled.  ENA negative, CB Negative, Jo 1 negative, anticardiolipin negative, beta-2 GP 1 negative, antiphosphatidylserine negative, anti-C1q negative, antihistone negative, rheumatoid factor 4.3 IgM equivocal, rheumatoid factor IgA negative, anti-CCP negative, anti-Car P-, antithyroglobulin negative, anti-TPO negative I had a detailed discussion with the patient that ANA titer is not significant and as she is not experiencing any symptoms of autoimmune disease no further evaluation is needed.  I advised her to contact me if she develops any of the symptoms discussed above.  She voiced understanding.     MGUS (monoclonal gammopathy of unknown significance): She is followed by hematology every 6 months.  Numbness and tingling of upper and Christina [name] extremities of both sides: She was evaluated by Dr. Krista Blue.  The  nerve conduction EMG test were normal.  She states she continues to have numbness in her hands.  The numbness in her Christina [name] extremities has improved.   Muscle cramps: She has been taking magnesium malate as discussed during the last visit.  She has not noticed improvement in her muscle cramps.  She still continues to have muscle cramps.  Stretching exercises were emphasized.  Primary osteoarthritis of right knee-she denies any discomfort in her knee joints.   DDD (degenerative disc disease), cervical-She denies neck pain.   Other medical conditions are listed as follows:    Critical limb ischemia with history of revascularization of same extremity   Peripheral arterial disease (HCC)-followed by cardiology.   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[place])   Vitamin D deficiency   Essential hypertension   Pre-diabetes   Follow Up Instructions:    I discussed the assessment and treatment plan with the patient. The patient was provided an opportunity to ask questions and all were answered. The patient agreed with the plan and demonstrated an understanding of the instructions.   The patient was advised to call back or seek an in-person evaluation if the symptoms worsen or if the condition fails to improve as anticipated.  I provided 20 minutes of non-face-to-face time during this encounter.   Bo Merino, MD

## 2021-10-19 NOTE — Anesthesia Postprocedure Evaluation (Signed)
Anesthesia Post Note  Patient: Christina [name]  Procedure(s) Performed: COLONOSCOPY WITH PROPOFOL POLYPECTOMY     Patient location during evaluation: Endoscopy Anesthesia Type: MAC Level of consciousness: awake and alert Pain management: pain level controlled Vital Signs Assessment: post-procedure vital signs reviewed and stable Respiratory status: spontaneous breathing, nonlabored ventilation, respiratory function stable and patient connected to nasal cannula oxygen Cardiovascular status: stable and blood pressure returned to baseline Postop Assessment: no apparent nausea or vomiting Anesthetic complications: no   No notable events documented.  Last Vitals:  Vitals:   10/19/21 0910 10/19/21 0920  BP: (!) 157/81 (!) 162/85  Pulse: 73 74  Resp: (!) 24 (!) 23  Temp:    SpO2: 96% 96%    Last Pain:  Vitals:   10/19/21 0920  TempSrc:   PainSc: 0-No pain                 March Rummage Desire Fulp

## 2021-10-19 NOTE — H&P (Signed)
[facility] History and Physical   Primary Care Physician:  Manfred Shirts, PA   Reason for Procedure:   Colon cancer screening- family history of colon cancer  Plan:    colonoscopy     HPI: Christina [name] is a 56 y.o. female  here for colonoscopy screening - first time exam. Mother had colon cancer dx age 48s. Patient denies any bowel symptoms at this time. Otherwise feels well without any cardiopulmonary symptoms. BMI > 50, case done at the hospital for anesthesia support. She has a history of PAD with stent placement in the past, on Plavix for that, as well as hypercoagulable disorder on Eliquis. She has been cleared to be off both of these - Plavix held for 5 days, Eliquis held for 2 days. I have discussed risks / benefits of these exams and anesthesia with her and she wants to proceed, all questions answered.    Past Medical History:  Diagnosis Date   Acute blood loss as cause of postoperative anemia    Anemia 01/22/2019   Chest pain    a. prior coronary CT without CAD.   Elevated sed rate 01/22/2019   Esophageal dysmotility    Essential hypertension 07/08/2016   GERD (gastroesophageal reflux disease)    MGUS (monoclonal gammopathy of unknown significance)    Numbness and tingling    PAD (peripheral artery disease) ([place])    a. 05/2018: PV angio subacute thrombotic occlusion of her popliteal artery. She underwent successful penumbra aspiration thrombectomy, PTA and self-expanding stenting using overlapping [place]ris self-expanding stents of a long thrombotic occlusion of the popliteal, anterior tibial and tibioperoneal trunk. Procedure c/b pseudoaneurysm/ABL anemia.   Pericardial effusion    Echo 9/22: Mild concentric LVH, EF 55-60, mild TR, RVSP 36 (mild pulmonary hypertension), mildly dilated IVC, small pericardial effusion // Echocardiogram 11/22: EF 62, Gr 1 DD, normal RVSF, trivial pericardial effusion   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[place])    Uterine fibroid     Vitamin D deficiency     Past Surgical History:  Procedure Laterality Date   ABDOMINAL AORTOGRAM W/LOWER EXTREMITY Right 05/08/2018   Procedure: ABDOMINAL AORTOGRAM W/LOWER EXTREMITY;  Surgeon: Lorretta Harp, MD;  Location: [facility];  Service: Cardiovascular;  Laterality: Right;   BREAST BIOPSY     BREAST EXCISIONAL BIOPSY Left    age 27   LOWER EXTREMITY ANGIOGRAPHY  07/12/2019   Procedure: Lower Extremity Angiography;  Surgeon: Lorretta Harp, MD;  Location: [facility];  Service: Cardiovascular;;   PERIPHERAL VASCULAR BALLOON ANGIOPLASTY  07/12/2019   Procedure: PERIPHERAL VASCULAR BALLOON ANGIOPLASTY;  Surgeon: Lorretta Harp, MD;  Location: [facility];  Service: Cardiovascular;;   PERIPHERAL VASCULAR INTERVENTION Right 05/08/2018   Procedure: PERIPHERAL VASCULAR INTERVENTION;  Surgeon: Lorretta Harp, MD;  Location: [facility];  Service: Cardiovascular;  Laterality: Right;  Anterior tibial and popliteal stents   PERIPHERAL VASCULAR THROMBECTOMY Right 05/08/2018   Procedure: PERIPHERAL VASCULAR THROMBECTOMY;  Surgeon: Lorretta Harp, MD;  Location: [facility];  Service: Cardiovascular;  Laterality: Right;  Popliteal, tibioperoneal trunk, Anterior tibial, Posterior tibial    Prior to Admission medications   Medication Sig Start Date End Date Taking? Authorizing Provider  apixaban (ELIQUIS) 5 MG TABS tablet TAKE 1 TABLET(5 MG) BY MOUTH TWICE DAILY 08/03/21  Yes Lorretta Harp, MD  baclofen (LIORESAL) 10 MG tablet Take 10 mg by mouth 3 (three) times daily as needed for muscle spasms.   Yes [provider]  carvedilol (COREG) 6.25 MG tablet Take 6.25 mg by mouth 2 (two) times daily with a meal.   Yes [provider]  cetirizine (ZYRTEC) 10 MG tablet Take 10 mg by mouth daily as needed for allergies. 10/01/21  Yes [provider]  Cholecalciferol (VITAMIN D3) 50 MCG (2000 UT) CAPS Take 2,000 Units by mouth at  bedtime.   Yes [provider]  clopidogrel (PLAVIX) 75 MG tablet Take 75 mg by mouth at bedtime.   Yes [provider]  ezetimibe (ZETIA) 10 MG tablet Take 1 tablet by mouth daily. 08/24/21  Yes [provider]  furosemide (LASIX) 20 MG tablet Take 20 mg by mouth daily. 07/27/21  Yes [provider]  irbesartan (AVAPRO) 150 MG tablet Take 150 mg by mouth daily.   Yes [provider]  magnesium oxide (MAG-OX) 400 (240 Mg) MG tablet Take 400 mg by mouth at bedtime.   Yes [provider]  megestrol (MEGACE) 40 MG tablet Take 1 tablet (40 mg total) by mouth daily in the afternoon. Patient taking differently: Take 40 mg by mouth every Monday, Wednesday, and Friday. At bedtime 08/14/21  Yes Lavonia Drafts, MD  metFORMIN (GLUCOPHAGE-XR) 500 MG 24 hr tablet Take 1 tablet by mouth daily with breakfast. 07/07/20  Yes [provider]  potassium chloride (KLOR-CON) 10 MEQ tablet Take 10 mEq by mouth at bedtime. 09/22/20  Yes [provider]  Vitamin D, Ergocalciferol, (DRISDOL) 1.25 MG (50000 UNIT) CAPS capsule Take 50,000 Units by mouth every Wednesday.   Yes [provider]  Sodium Sulfate-Mag Sulfate-KCl (SUTAB) [phone] MG TABS Take 1 kit by mouth as directed. 08/14/21   Noralyn Pick, NP    Current Facility-Administered Medications  Medication Dose Route Frequency Provider Last Rate Last Admin   0.9 %  sodium chloride infusion   Intravenous Continuous Noralyn Pick, NP        Allergies as of 07/28/2021 - Review Complete 07/27/2021  Allergen Reaction Noted   Lipitor [atorvastatin]  09/05/2020   Lisinopril Cough 09/06/2016    Family History  Problem Relation Age of Onset   Hypertension Mother    Colon cancer Mother 53   Alzheimer's disease Father    Healthy Son    Colon polyps Neg Hx    Crohn's disease Neg Hx    Rectal cancer Neg Hx    Stomach cancer Neg Hx    Pancreatic cancer Neg  Hx     Social History   Socioeconomic History   Marital status: Single    Spouse name: Not on file   Number of children: 1   Years of education: two years of college   Highest education level: Not on file  Occupational History   Not on file  Tobacco Use   Smoking status: Never   Smokeless tobacco: Never  Vaping Use   Vaping Use: Never used  Substance and Sexual Activity   Alcohol use: No   Drug use: No   Sexual activity: Yes  Other Topics Concern   Not on file  Social History Narrative   Lives alone.   Right-handed.   No daily use of caffeine.   Social Determinants of Health   Financial Resource Strain: Not on file  Food Insecurity: Not on file  Transportation Needs: Not on file  Physical Activity: Not on file  Stress: Not on file  Social Connections: Not on file  Intimate Partner Violence: Not on file    Review  of Systems: All other review of systems negative except as mentioned in the HPI.  Physical Exam: Vital signs   General:   Alert,  Well-developed, pleasant and cooperative in NAD Lungs:  Clear throughout to auscultation.   Heart:  Regular rate and rhythm Abdomen:  Soft, nontender and nondistended, protuberant Neuro/Psych:  Alert and cooperative. Normal mood and affect. A and O x 3  Jolly Mango, MD [facility]

## 2021-10-19 NOTE — Discharge Instructions (Signed)

## 2021-10-20 ENCOUNTER — Telehealth: Payer: Self-pay | Admitting: Gastroenterology

## 2021-10-20 ENCOUNTER — Encounter (HOSPITAL_COMMUNITY): Payer: Self-pay | Admitting: Gastroenterology

## 2021-10-20 LAB — SURGICAL PATHOLOGY

## 2021-10-20 NOTE — Telephone Encounter (Signed)
Returned call to patient. I informed her that internal hemorrhoids were noted on her colonoscopy, pt states that she did not even know that she could have hemorrhoids internally. I told pt that they are very common and are not of any concern especially if they are not causing her any problems. I told pt that she can give [place] a call back if she has any concerns like rectal pain/discomfort, or BRB per rectum. Pt wanted to know when to expect path results. I informed pt that it can take up to a week before she may receive her results. I told her to expect a MyChart message from Dr. Havery Moros with results. Pt verbalized understanding and had no concerns at the end of the call.

## 2021-10-20 NOTE — Telephone Encounter (Signed)
Inbound call from patient inquiring about results she received.Patient states she has internal theroids and want to speak with someone regarding what can be done.Please give patient a call back to advise.

## 2021-10-21 NOTE — Telephone Encounter (Signed)
Patient called requesting to speak with Dr. Havery Moros regarding her results.

## 2021-10-22 NOTE — Telephone Encounter (Signed)
Returned call to patient. She states that she was able to review MyChart message from Dr. Havery Moros and did not have any concerns. Pt is aware that she will receive a letter closer to the time of her recall. Pt verbalized understanding and had no concerns at the end of the call.

## 2021-10-27 IMAGING — DX DG LUMBAR SPINE 2-3V
2 series · 2 of 2 positions shown · non-contrast
Comparison: None

CLINICAL DATA: Low back pain

EXAM:
LUMBAR SPINE - 2-3 VIEW

[l-spine ap]
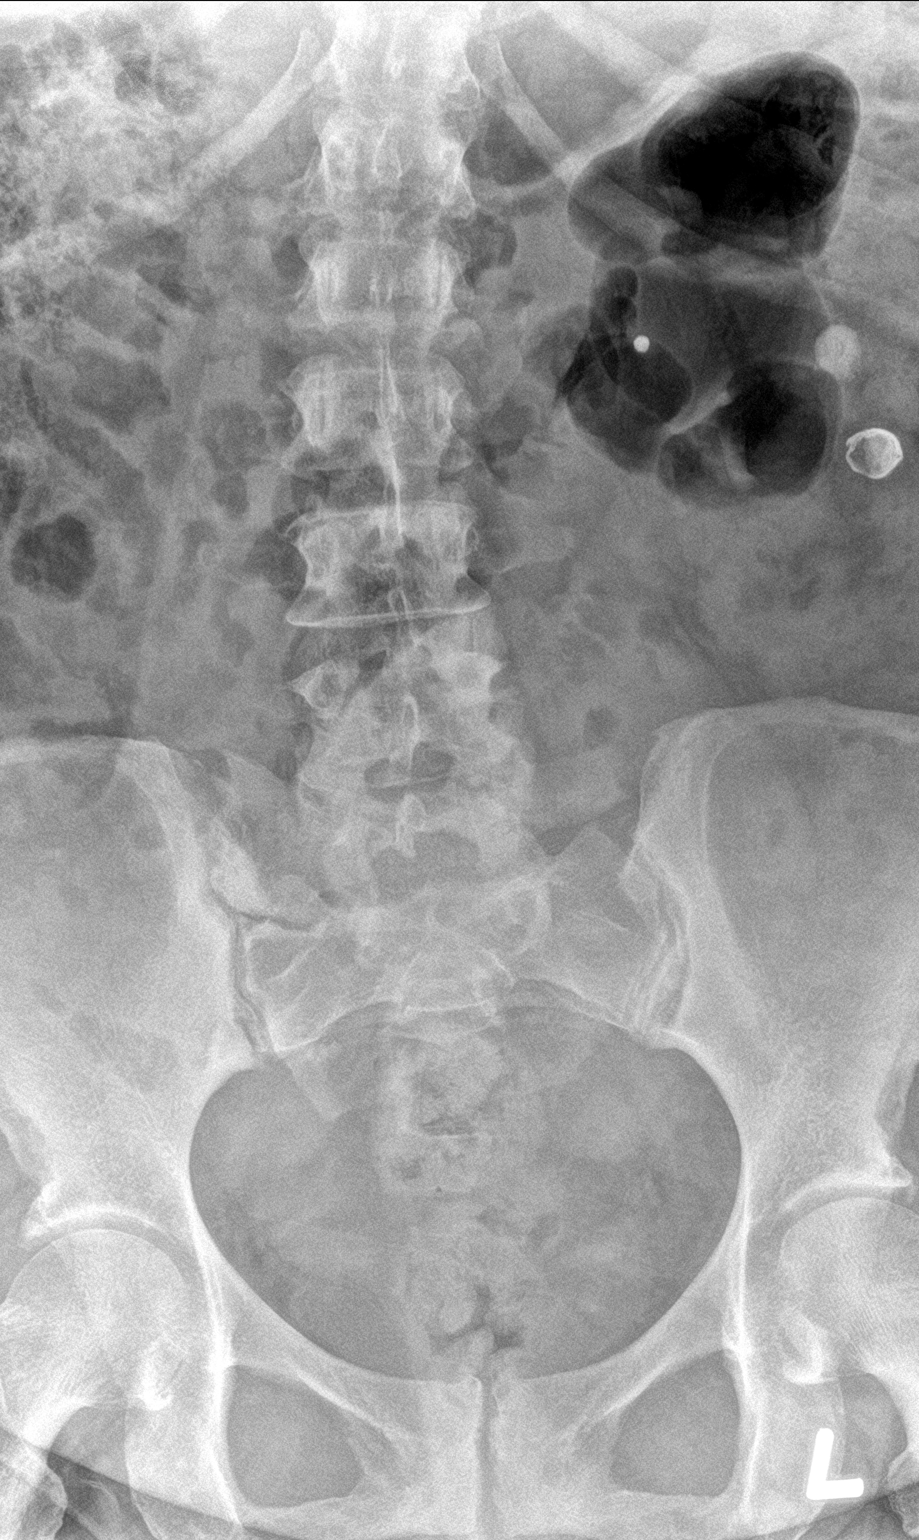

[l-spine lat]
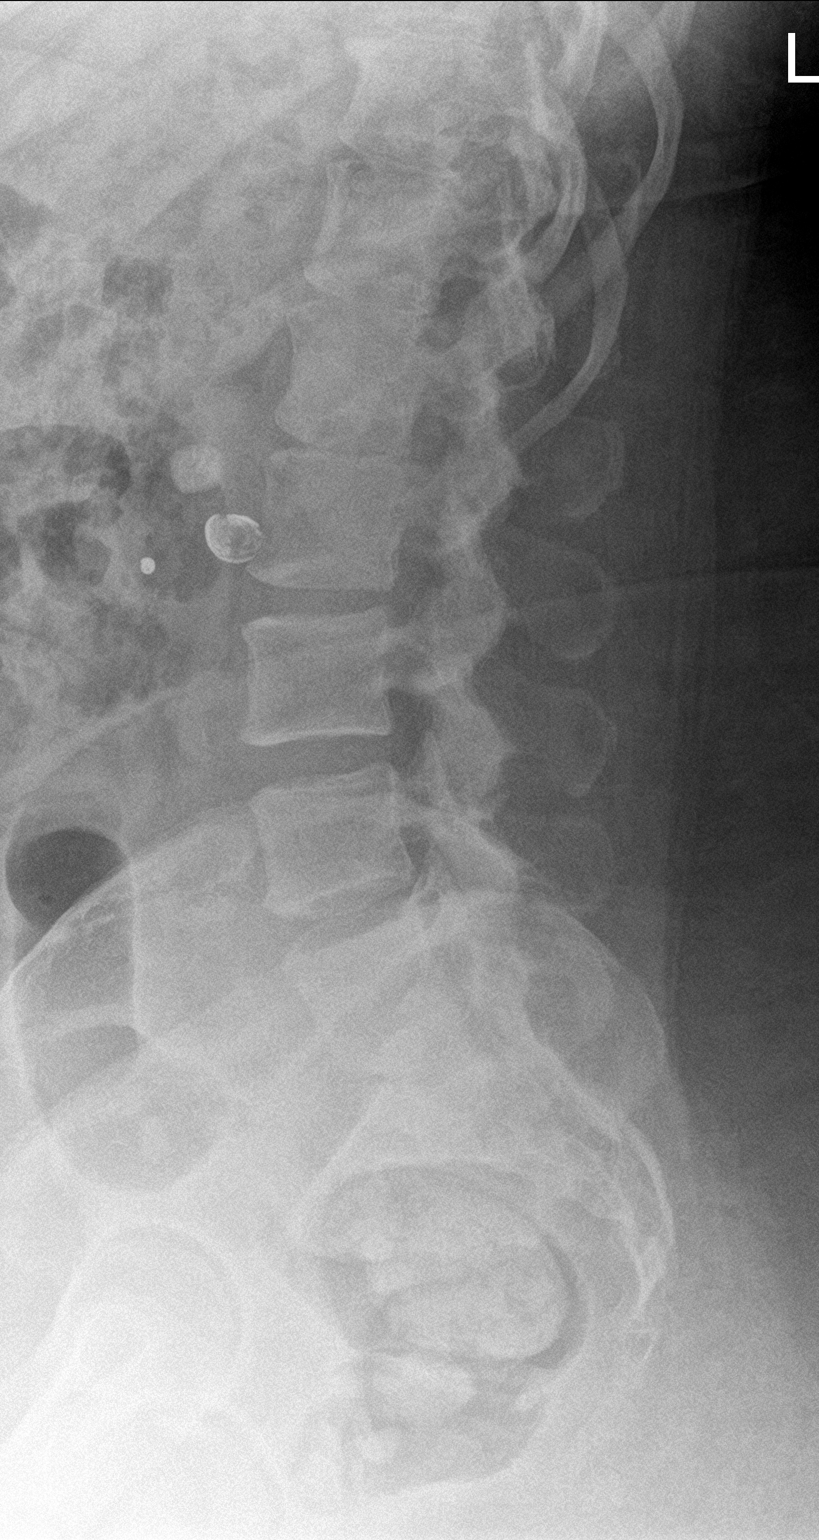

[2 of 2 positions shown; findings below may reference images not displayed]

FINDINGS: Mild curvature of the lumbar spine is convex towards the right.
Right sided transitional type vertebra is identified at the L5
level. Vertebral body heights are well preserved. Mild disc space
narrowing and endplate spurring is noted. Most advanced at L5-S1. No
fractures or dislocations identified.
IMPRESSION: 1. Mild scoliosis and degenerative disc disease.
2. Right-sided transitional type vertebra at the L5 level.

## 2021-10-28 ENCOUNTER — Ambulatory Visit (INDEPENDENT_AMBULATORY_CARE_PROVIDER_SITE_OTHER): Payer: BC Managed Care – PPO | Admitting: Rheumatology

## 2021-10-28 ENCOUNTER — Encounter: Payer: Self-pay | Admitting: Rheumatology

## 2021-10-28 VITALS — Ht 65.0 in

## 2021-10-28 DIAGNOSIS — R200 Anesthesia of skin: Secondary | ICD-10-CM | POA: Diagnosis not present

## 2021-10-28 DIAGNOSIS — R768 Other specified abnormal immunological findings in serum: Secondary | ICD-10-CM

## 2021-10-28 DIAGNOSIS — R700 Elevated erythrocyte sedimentation rate: Secondary | ICD-10-CM

## 2021-10-28 DIAGNOSIS — I739 Peripheral vascular disease, unspecified: Secondary | ICD-10-CM

## 2021-10-28 DIAGNOSIS — T81718A Complication of other artery following a procedure, not elsewhere classified, initial encounter: Secondary | ICD-10-CM

## 2021-10-28 DIAGNOSIS — D472 Monoclonal gammopathy: Secondary | ICD-10-CM

## 2021-10-28 DIAGNOSIS — R252 Cramp and spasm: Secondary | ICD-10-CM

## 2021-10-28 DIAGNOSIS — I729 Aneurysm of unspecified site: Secondary | ICD-10-CM

## 2021-10-28 DIAGNOSIS — E559 Vitamin D deficiency, unspecified: Secondary | ICD-10-CM

## 2021-10-28 DIAGNOSIS — M1711 Unilateral primary osteoarthritis, right knee: Secondary | ICD-10-CM

## 2021-10-28 DIAGNOSIS — R7303 Prediabetes: Secondary | ICD-10-CM

## 2021-10-28 DIAGNOSIS — I10 Essential (primary) hypertension: Secondary | ICD-10-CM

## 2021-10-28 DIAGNOSIS — R202 Paresthesia of skin: Secondary | ICD-10-CM

## 2021-10-28 DIAGNOSIS — M5030 Other cervical disc degeneration, unspecified cervical region: Secondary | ICD-10-CM

## 2021-11-06 ENCOUNTER — Encounter: Payer: Self-pay | Admitting: *Deleted

## 2021-11-06 ENCOUNTER — Other Ambulatory Visit: Payer: Self-pay | Admitting: *Deleted

## 2021-11-06 MED ORDER — APIXABAN 5 MG PO TABS: ORAL_TABLET | ORAL | 1 refills | Status: DC

## 2021-11-06 NOTE — Telephone Encounter (Addendum)
Pt called needing updated Eliquis Co-pay card with a refill. Pt moved to [place] but wants to continue to see Dr. Gwenlyn Found.  Will send refill to new pharmacy.   Prescription refill request for Eliquis received. Indication: Pad  Last office visit: Gwenlyn Found 10/06/2021 Scr: 1.04, 10/09/2021 Age: 56 yo  Weight: 141.5 kg    Refill sent with Co-pay card information   RxBIN: [id number] RxPcn: L[employer]ty ID: [id number]

## 2021-11-07 ENCOUNTER — Encounter: Payer: Self-pay | Admitting: Cardiovascular Disease

## 2021-11-09 MED ORDER — IRBESARTAN 150 MG PO TABS: 150.0000 mg | ORAL_TABLET | Freq: Every day | ORAL | 3 refills | Status: AC

## 2021-11-30 DIAGNOSIS — D472 Monoclonal gammopathy: Secondary | ICD-10-CM | POA: Diagnosis not present

## 2021-12-07 DIAGNOSIS — D472 Monoclonal gammopathy: Secondary | ICD-10-CM | POA: Diagnosis not present

## 2021-12-07 DIAGNOSIS — R58 Hemorrhage, not elsewhere classified: Secondary | ICD-10-CM | POA: Diagnosis not present

## 2021-12-07 DIAGNOSIS — I739 Peripheral vascular disease, unspecified: Secondary | ICD-10-CM | POA: Diagnosis not present

## 2021-12-13 ENCOUNTER — Encounter: Payer: Self-pay | Admitting: Cardiovascular Disease

## 2021-12-14 MED ORDER — CLOPIDOGREL BISULFATE 75 MG PO TABS: 75.0000 mg | ORAL_TABLET | Freq: Every day | ORAL | 1 refills | Status: DC

## 2021-12-15 ENCOUNTER — Telehealth: Payer: Self-pay | Admitting: Gastroenterology

## 2021-12-15 NOTE — Telephone Encounter (Signed)
Returned call to patient.  She states that she feels fine but her stools turned black overnight. Pt denies any iron supplements. She did state  that she ate soup on Sunday and it made her sick so she has been drinking a lot of Pepto-bismol. I reassured the patient that the black stools are more than likely related to her Pepto-bismol intake. Pt had a recent colonoscopy with pre-cancerous polyps noted. Pt advised to hold Pepto for a few days and see if her stool color returns to normal. Pt advised to call back if she notices any BRBPR, or if she develops any SOB, dizziness, or fatigue. Pt was thankful for the prompt return call. She verbalized understanding of all information and had no concerns at the end of the call.

## 2021-12-15 NOTE — Telephone Encounter (Signed)
Patient called states her bowel movement has been jet black. Requesting a call back.

## 2022-01-07 DIAGNOSIS — D472 Monoclonal gammopathy: Secondary | ICD-10-CM | POA: Diagnosis not present

## 2022-01-07 DIAGNOSIS — N939 Abnormal uterine and vaginal bleeding, unspecified: Secondary | ICD-10-CM | POA: Diagnosis not present

## 2022-01-07 DIAGNOSIS — E782 Mixed hyperlipidemia: Secondary | ICD-10-CM | POA: Diagnosis not present

## 2022-01-07 DIAGNOSIS — M1711 Unilateral primary osteoarthritis, right knee: Secondary | ICD-10-CM | POA: Diagnosis not present

## 2022-01-07 DIAGNOSIS — M25561 Pain in right knee: Secondary | ICD-10-CM | POA: Diagnosis not present

## 2022-01-13 DIAGNOSIS — M25561 Pain in right knee: Secondary | ICD-10-CM | POA: Diagnosis not present

## 2022-01-13 DIAGNOSIS — M25562 Pain in left knee: Secondary | ICD-10-CM | POA: Diagnosis not present

## 2022-01-13 DIAGNOSIS — M170 Bilateral primary osteoarthritis of knee: Secondary | ICD-10-CM | POA: Diagnosis not present

## 2022-02-06 DIAGNOSIS — Z1231 Encounter for screening mammogram for malignant neoplasm of breast: Secondary | ICD-10-CM | POA: Diagnosis not present

## 2022-02-13 ENCOUNTER — Other Ambulatory Visit: Payer: Self-pay | Admitting: Cardiovascular Disease

## 2022-02-15 NOTE — Telephone Encounter (Signed)
Prescription refill request for Eliquis received. Indication:PAD Last office visit:6/23 Scr:1.0 Age: 56 Weight:141.5 kg  Prescription refilled

## 2022-02-26 DIAGNOSIS — I739 Peripheral vascular disease, unspecified: Secondary | ICD-10-CM | POA: Diagnosis not present

## 2022-03-04 DIAGNOSIS — D472 Monoclonal gammopathy: Secondary | ICD-10-CM | POA: Diagnosis not present

## 2022-03-08 DIAGNOSIS — D472 Monoclonal gammopathy: Secondary | ICD-10-CM | POA: Diagnosis not present

## 2022-03-19 DIAGNOSIS — D472 Monoclonal gammopathy: Secondary | ICD-10-CM | POA: Diagnosis not present

## 2022-04-19 DIAGNOSIS — M25562 Pain in left knee: Secondary | ICD-10-CM | POA: Diagnosis not present

## 2022-04-19 DIAGNOSIS — M25561 Pain in right knee: Secondary | ICD-10-CM | POA: Diagnosis not present

## 2022-04-19 DIAGNOSIS — R7301 Impaired fasting glucose: Secondary | ICD-10-CM | POA: Diagnosis not present

## 2022-04-19 DIAGNOSIS — E782 Mixed hyperlipidemia: Secondary | ICD-10-CM | POA: Diagnosis not present

## 2022-04-19 DIAGNOSIS — E559 Vitamin D deficiency, unspecified: Secondary | ICD-10-CM | POA: Diagnosis not present

## 2022-04-19 DIAGNOSIS — G8929 Other chronic pain: Secondary | ICD-10-CM | POA: Diagnosis not present

## 2022-04-19 DIAGNOSIS — M170 Bilateral primary osteoarthritis of knee: Secondary | ICD-10-CM | POA: Diagnosis not present

## 2022-05-20 DIAGNOSIS — G5621 Lesion of ulnar nerve, right upper limb: Secondary | ICD-10-CM | POA: Diagnosis not present

## 2022-06-10 ENCOUNTER — Other Ambulatory Visit: Payer: Self-pay | Admitting: Cardiovascular Disease

## 2022-08-03 DIAGNOSIS — D472 Monoclonal gammopathy: Secondary | ICD-10-CM | POA: Diagnosis not present

## 2022-08-16 ENCOUNTER — Encounter: Payer: Self-pay | Admitting: *Deleted

## 2022-08-24 ENCOUNTER — Telehealth: Payer: Self-pay | Admitting: Cardiovascular Disease

## 2022-08-24 DIAGNOSIS — I70229 Atherosclerosis of native arteries of extremities with rest pain, unspecified extremity: Secondary | ICD-10-CM

## 2022-08-24 DIAGNOSIS — I739 Peripheral vascular disease, unspecified: Secondary | ICD-10-CM

## 2022-08-24 NOTE — Telephone Encounter (Signed)
Pt states that she normally has a Doppler test done before coming in for her annual f/u. She would like to know if an order can be put in for Doppler and called to get scheduled. Please advise

## 2022-08-24 NOTE — Telephone Encounter (Signed)
Spoke to patient she stated she needs lower ext arterial dopplers with ABI's and follow up appointment with Dr.Berry scheduled in June.She lives 4 hours away and wants both on same day.Dopplers scheduled 6/5 at 1:30 pm and follow up appt with Dr.Berry 6/5 at 4:30 pm.

## 2022-08-29 ENCOUNTER — Encounter: Payer: Self-pay | Admitting: Obstetrics & Gynecology

## 2022-08-30 ENCOUNTER — Other Ambulatory Visit: Payer: Self-pay | Admitting: Obstetrics & Gynecology

## 2022-08-30 DIAGNOSIS — D219 Benign neoplasm of connective and other soft tissue, unspecified: Secondary | ICD-10-CM

## 2022-08-30 DIAGNOSIS — N939 Abnormal uterine and vaginal bleeding, unspecified: Secondary | ICD-10-CM

## 2022-08-30 MED ORDER — MEGESTROL ACETATE 40 MG PO TABS: 40.0000 mg | ORAL_TABLET | Freq: Every day | ORAL | 3 refills | Status: DC

## 2022-09-05 ENCOUNTER — Encounter: Payer: Self-pay | Admitting: Obstetrics & Gynecology

## 2022-09-07 ENCOUNTER — Other Ambulatory Visit: Payer: Self-pay | Admitting: Obstetrics & Gynecology

## 2022-09-07 ENCOUNTER — Other Ambulatory Visit: Payer: Self-pay

## 2022-09-07 DIAGNOSIS — N939 Abnormal uterine and vaginal bleeding, unspecified: Secondary | ICD-10-CM

## 2022-09-07 DIAGNOSIS — D219 Benign neoplasm of connective and other soft tissue, unspecified: Secondary | ICD-10-CM

## 2022-09-07 MED ORDER — MEGESTROL ACETATE 40 MG PO TABS: 40.0000 mg | ORAL_TABLET | Freq: Two times a day (BID) | ORAL | 0 refills | Status: DC

## 2022-09-07 MED ORDER — MEGESTROL ACETATE 40 MG PO TABS
40.0000 mg | ORAL_TABLET | Freq: Two times a day (BID) | ORAL | 0 refills | Status: AC
Start: 2022-09-07 — End: ?

## 2022-09-08 ENCOUNTER — Ambulatory Visit: Payer: BC Managed Care – PPO | Admitting: Obstetrics & Gynecology

## 2022-09-14 ENCOUNTER — Telehealth: Payer: Self-pay | Admitting: *Deleted

## 2022-09-14 ENCOUNTER — Encounter: Payer: Self-pay | Admitting: Cardiovascular Disease

## 2022-09-14 NOTE — Telephone Encounter (Signed)
Pt calling in today to get copay for Eliquis. Pt wanted to know number for [facility] to get the rx code sent in to pharmacy. Not sure what that number is. Pt sent Dr. Allyson Sabal a mychart message today requesting copay.  Stated to send mychart message about the number the pt was looking for.

## 2022-09-14 NOTE — Telephone Encounter (Signed)
Clarification.  NOT needed as she has enough through her appt .  She can get refills then thank you

## 2022-09-14 NOTE — Telephone Encounter (Signed)
Please confirm dosage in order to send 30 day to last until  her appt on 6/5

## 2022-09-30 DIAGNOSIS — M170 Bilateral primary osteoarthritis of knee: Secondary | ICD-10-CM | POA: Diagnosis not present

## 2022-10-05 ENCOUNTER — Encounter (HOSPITAL_COMMUNITY): Payer: BC Managed Care – PPO

## 2022-10-06 ENCOUNTER — Encounter: Payer: BC Managed Care – PPO | Admitting: Obstetrics & Gynecology

## 2022-10-06 ENCOUNTER — Encounter: Payer: Self-pay | Admitting: Cardiovascular Disease

## 2022-10-06 ENCOUNTER — Ambulatory Visit (INDEPENDENT_AMBULATORY_CARE_PROVIDER_SITE_OTHER): Payer: BC Managed Care – PPO | Admitting: Cardiovascular Disease

## 2022-10-06 ENCOUNTER — Ambulatory Visit
Admission: RE | Admit: 2022-10-06 | Discharge: 2022-10-06 | Disposition: A | Discharge status: Home / Self Care | Payer: BC Managed Care – PPO | Source: Ambulatory Visit | Attending: Cardiovascular Disease | Admitting: Cardiovascular Disease

## 2022-10-06 VITALS — BP 148/78 | HR 88 | Ht 65.0 in | Wt 301.8 lb

## 2022-10-06 DIAGNOSIS — Z9889 Other specified postprocedural states: Secondary | ICD-10-CM | POA: Insufficient documentation

## 2022-10-06 DIAGNOSIS — I70229 Atherosclerosis of native arteries of extremities with rest pain, unspecified extremity: Secondary | ICD-10-CM

## 2022-10-06 DIAGNOSIS — I739 Peripheral vascular disease, unspecified: Secondary | ICD-10-CM | POA: Diagnosis not present

## 2022-10-06 DIAGNOSIS — E782 Mixed hyperlipidemia: Secondary | ICD-10-CM | POA: Insufficient documentation

## 2022-10-06 DIAGNOSIS — I10 Essential (primary) hypertension: Secondary | ICD-10-CM | POA: Diagnosis not present

## 2022-10-06 DIAGNOSIS — D472 Monoclonal gammopathy: Secondary | ICD-10-CM | POA: Diagnosis not present

## 2022-10-06 LAB — VAS US ABI WITH/WO TBI
Left ABI: 1.06
Right ABI: 0.92

## 2022-10-06 MED ORDER — APIXABAN 5 MG PO TABS: ORAL_TABLET | ORAL | 3 refills | Status: DC

## 2022-10-06 NOTE — Patient Instructions (Signed)
Medication Instructions:  Your physician recommends that you continue on your current medications as directed. Please refer to the Current Medication list given to you today.  *If you need a refill on your cardiac medications before your next appointment, please call your pharmacy*   Testing/Procedures: Your physician has requested that you have a lower extremity arterial duplex. During this test, ultrasound is used to evaluate arterial blood flow in the legs. Allow one hour for this exam. There are no restrictions or special instructions. This will take place at [address] [facility], [address]. To do in June 2025.   Your physician has requested that you have an ankle brachial index (ABI). During this test an ultrasound and blood pressure cuff are used to evaluate the arteries that supply the arms and legs with blood. Allow thirty minutes for this exam. There are no restrictions or special instructions. This will take place at [address] [facility], [address]. To do in June 2025.     Follow-Up: At [facility], you and your health needs are our priority.  As part of our continuing mission to provide you with exceptional heart care, we have created designated Provider Care Teams.  These Care Teams include your primary Cardiologist (physician) and Advanced Practice Providers (APPs -  Physician Assistants and Nurse Practitioners) who all work together to provide you with the care you need, when you need it.  We recommend signing up for the patient portal called "MyChart".  Sign up information is provided on this After Visit Summary.  MyChart is used to connect with patients for Virtual Visits (Telemedicine).  Patients are able to view lab/test results, encounter notes, upcoming appointments, etc.  Non-urgent messages can be sent to your provider as well.   To learn more about what you can do with MyChart, go to [email or web address].    Your next appointment:   12 month(s)  Provider:    Nanetta Batty, MD

## 2022-10-06 NOTE — Assessment & Plan Note (Signed)
History of PAD with thrombotic occlusion of the right popliteal artery and tibioperoneal trunk.  I performed a complex procedure on her 05/08/2018 using penumbra aspiration thrombectomy followed by balloon angioplasty and 2 overlapping self-expanding tiger self-expanding stents in the popliteal extending down into the anterior tibial artery.  Unfortunately her tibioperoneal trunk occluded with ultimately her symptoms improved.  Because of recurrent disease I restudied her 07/12/2019 revealing an 80% "in-stent restenosis.  I performed drug-coated balloon angioplasty with excellent result.  Her claudication resolved and her Dopplers normalized.  Her most recent Dopplers performed today revealed stable ABIs with mild progression of disease in her distal right popliteal stent although she is totally asymptomatic.  Will continue to follow this on annual basis.  She remains on Eliquis oral anticoagulation.

## 2022-10-06 NOTE — Progress Notes (Signed)
10/06/2022 Christina [name]   June 26, 1965  [id number]  Primary Physician Shellia Cleverly, PA Primary Cardiologist: Runell Gess MD Nicholes Calamity, [place]  HPI:  Christina [name] is a 56 y.o.  severely overweight single African-American female mother of 1 child, grandmother and 3 grandchildren who works Scientist, product/process development at [employer].  She was referred by Dr. Elease Hashimoto for peripheral vascular evaluation because of  right calf claudication which was lifestyle limiting.  I last saw her in the office 10/06/2021. She has a history of hypertension.  She has chronic chest pain with a recent negative Myoview and a negative coronary CTA 1 year ago.  She had fairly recent onset right calf pain approxi-1 month ago that occurred when she woke up 1 morning and has been fairly persistent with ambulation.  She had lower extremity arterial Doppler studies performed 04/28/2018 revealing a right ABI 0.61 with an occluded right popliteal artery.   I performed peripheral angiography 05/08/2018 revealing occluded above-knee popliteal artery extending down below the knee into the proximal anterior tibial and tibioperoneal trunk.  She had a long complex procedure with penumbra aspiration thrombectomy followed by balloon angioplasty and implantation of 2 overlapping [place] self-expanding stents beginning in the popliteal artery extending down into the anterior tibial artery.  Unfortunately her tibioperoneal trunk remains occluded.  Her Dopplers normalized and her pain is resolved.  She did unfortunately have a left common femoral pseudoaneurysm and ultimately underwent ultrasound-guided thrombin injection and successfully was closed.   She is on Eliquis and clopidogrel.  Dopplers performed 05/26/2018 revealed a right ABI of 1.24 with a patent stent.  She does continue to complain of atypical chest pain beginning under her right breast and going into her neck and jaw.  She had a negative Myoview stress test in December of last year  and a coronary CTA revealing a coronary calcium score of 0 with normal coronary arteries.  In addition, she is taken sublingual nitroglycerin for this which does not work.  I reassured her that I do not think this is related to coronary artery disease.   She continues to have right calf claudication similar to her preintervention symptoms as well as numbness of her toes.  She describes recurrent claudication over the last 2 months when walking in [place].  Her most recent Dopplers show decline in her right ABI from 1.24 down to 0.93 with a moderate increase in the velocities within the tibioperoneal stent.  Based on this we decided to proceed with outpatient angiography   I performed peripheral angiography on her via the left femoral approach 07/12/2019 revealing 80% "in-stent restenosis within the popliteal and tibioperoneal stent.  Performed drug-coated balloon angioplasty with excellent result.  Her claudication has resolved and her Dopplers are normalized.  She is back on triple therapy for 1 month after which aspirin will be discontinued.   Since I saw her in the office 1 year ago she continues to do well.  Her Dopplers performed today showed a patent right popliteal artery stent with mild progression of disease although she denies claudication.  She denies chest pain or shortness of breath.   Current Meds  Medication Sig   carvedilol (COREG) 6.25 MG tablet Take 6.25 mg by mouth 2 (two) times daily with a meal.   Cholecalciferol (VITAMIN D3) 50 MCG (2000 UT) CAPS Take 2,000 Units by mouth at bedtime.   clopidogrel (PLAVIX) 75 MG tablet TAKE 1 TABLET BY MOUTH AT BEDTIME   cyanocobalamin (VITAMIN  B12) 1000 MCG tablet Take 1,000 mcg by mouth daily.   furosemide (LASIX) 20 MG tablet Take 20 mg by mouth daily.   irbesartan (AVAPRO) 150 MG tablet Take 1 tablet (150 mg total) by mouth daily.   megestrol (MEGACE) 40 MG tablet Take 1 tablet (40 mg total) by mouth 2 (two) times daily.   metFORMIN  (GLUCOPHAGE-XR) 500 MG 24 hr tablet Take 1 tablet by mouth daily with breakfast.   potassium chloride (KLOR-CON) 10 MEQ tablet Take 10 mEq by mouth at bedtime.   pravastatin (PRAVACHOL) 20 MG tablet Take 20 mg by mouth at bedtime.   Vitamin D, Ergocalciferol, (DRISDOL) 1.25 MG (50000 UNIT) CAPS capsule Take 50,000 Units by mouth every Wednesday.   [DISCONTINUED] ELIQUIS 5 MG TABS tablet TAKE 1 TABLET(5 MG) BY MOUTH TWICE DAILY     Allergies  Allergen Reactions   Lipitor [Atorvastatin]     Myalgias   Lisinopril Cough    Social History   Socioeconomic History   Marital status: Single    Spouse name: Not on file   Number of children: 1   Years of education: two years of college   Highest education level: Not on file  Occupational History   Not on file  Tobacco Use   Smoking status: Never   Smokeless tobacco: Never  Vaping Use   Vaping Use: Never used  Substance and Sexual Activity   Alcohol use: No   Drug use: No   Sexual activity: Yes  Other Topics Concern   Not on file  Social History Narrative   Lives alone.   Right-handed.   No daily use of caffeine.   Social Determinants of Health   Financial Resource Strain: Not on file  Food Insecurity: Not on file  Transportation Needs: Not on file  Physical Activity: Not on file  Stress: Not on file  Social Connections: Not on file  Intimate Partner Violence: Not on file     Review of Systems: General: negative for chills, fever, night sweats or weight changes.  Cardiovascular: negative for chest pain, dyspnea on exertion, edema, orthopnea, palpitations, paroxysmal nocturnal dyspnea or shortness of breath Dermatological: negative for rash Respiratory: negative for cough or wheezing Urologic: negative for hematuria Abdominal: negative for nausea, vomiting, diarrhea, bright red blood per rectum, melena, or hematemesis Neurologic: negative for visual changes, syncope, or dizziness All other systems reviewed and are  otherwise negative except as noted above.    Blood pressure (!) 148/78, pulse 88, height 5\' 5"  (1.651 m), weight (!) 301 lb 12.8 oz (136.9 kg), SpO2 94 %.  General appearance: alert and no distress Neck: no adenopathy, no carotid bruit, no JVD, supple, symmetrical, trachea midline, and thyroid not enlarged, symmetric, no tenderness/mass/nodules Lungs: clear to auscultation bilaterally Heart: regular rate and rhythm, S1, S2 normal, no murmur, click, rub or gallop Extremities: extremities normal, atraumatic, no cyanosis or edema Pulses: 2+ and symmetric Skin: Skin color, texture, turgor normal. No rashes or lesions Neurologic: Grossly normal  EKG sinus rhythm at 88 without ST or T wave changes.  I personally reviewed this EKG.  ASSESSMENT AND PLAN:   Essential hypertension History of essential hypertension with blood pressure measured today at 148/70.  She is on Avapro, and carvedilol.  Peripheral arterial disease (HCC) History of PAD with thrombotic occlusion of the right popliteal artery and tibioperoneal trunk.  I performed a complex procedure on her 05/08/2018 using penumbra aspiration thrombectomy followed by balloon angioplasty and 2 overlapping self-expanding tiger self-expanding stents  in the popliteal extending down into the anterior tibial artery.  Unfortunately her tibioperoneal trunk occluded with ultimately her symptoms improved.  Because of recurrent disease I restudied her 07/12/2019 revealing an 80% "in-stent restenosis.  I performed drug-coated balloon angioplasty with excellent result.  Her claudication resolved and her Dopplers normalized.  Her most recent Dopplers performed today revealed stable ABIs with mild progression of disease in her distal right popliteal stent although she is totally asymptomatic.  Will continue to follow this on annual basis.  She remains on Eliquis oral anticoagulation.  Mixed hyperlipidemia History of hyperlipidemia on pravastatin and Zetia with  recent lipid profile performed 5 months ago revealing total cholesterol 155, LDL of 83 and HDL 54.     Runell Gess MD [facility], [facility] 10/06/2022 4:59 PM

## 2022-10-06 NOTE — Progress Notes (Signed)
Pt was provided with Eliquis co-pay card today.

## 2022-10-06 NOTE — Assessment & Plan Note (Signed)
History of hyperlipidemia on pravastatin and Zetia with recent lipid profile performed 5 months ago revealing total cholesterol 155, LDL of 83 and HDL 54.

## 2022-10-06 NOTE — Assessment & Plan Note (Signed)
History of essential hypertension with blood pressure measured today at 148/70.  She is on Avapro, and carvedilol.

## 2022-10-12 ENCOUNTER — Ambulatory Visit: Payer: BC Managed Care – PPO | Admitting: Family Medicine

## 2022-10-12 ENCOUNTER — Ambulatory Visit: Payer: BC Managed Care – PPO | Admitting: Physician Assistant

## 2022-10-26 NOTE — Progress Notes (Signed)
Erroneous encounter.  Pt not seen.

## 2022-10-27 ENCOUNTER — Telehealth: Payer: BC Managed Care – PPO | Admitting: Obstetrics & Gynecology

## 2022-11-02 DIAGNOSIS — I10 Essential (primary) hypertension: Secondary | ICD-10-CM | POA: Diagnosis not present

## 2022-11-02 DIAGNOSIS — M791 Myalgia, unspecified site: Secondary | ICD-10-CM | POA: Diagnosis not present

## 2022-11-02 DIAGNOSIS — R7303 Prediabetes: Secondary | ICD-10-CM | POA: Diagnosis not present

## 2022-11-02 DIAGNOSIS — E559 Vitamin D deficiency, unspecified: Secondary | ICD-10-CM | POA: Diagnosis not present

## 2022-11-03 DIAGNOSIS — E559 Vitamin D deficiency, unspecified: Secondary | ICD-10-CM | POA: Diagnosis not present

## 2022-11-03 DIAGNOSIS — Z79899 Other long term (current) drug therapy: Secondary | ICD-10-CM | POA: Diagnosis not present

## 2022-11-03 DIAGNOSIS — M791 Myalgia, unspecified site: Secondary | ICD-10-CM | POA: Diagnosis not present

## 2022-11-30 DIAGNOSIS — R350 Frequency of micturition: Secondary | ICD-10-CM | POA: Diagnosis not present

## 2022-11-30 DIAGNOSIS — M546 Pain in thoracic spine: Secondary | ICD-10-CM | POA: Diagnosis not present

## 2022-11-30 DIAGNOSIS — R109 Unspecified abdominal pain: Secondary | ICD-10-CM | POA: Diagnosis not present

## 2022-11-30 DIAGNOSIS — M419 Scoliosis, unspecified: Secondary | ICD-10-CM | POA: Diagnosis not present

## 2022-12-20 DIAGNOSIS — R350 Frequency of micturition: Secondary | ICD-10-CM | POA: Diagnosis not present

## 2022-12-20 DIAGNOSIS — G8929 Other chronic pain: Secondary | ICD-10-CM | POA: Diagnosis not present

## 2022-12-20 DIAGNOSIS — R109 Unspecified abdominal pain: Secondary | ICD-10-CM | POA: Diagnosis not present

## 2022-12-20 DIAGNOSIS — M419 Scoliosis, unspecified: Secondary | ICD-10-CM | POA: Diagnosis not present

## 2022-12-21 ENCOUNTER — Other Ambulatory Visit: Payer: Self-pay | Admitting: Cardiovascular Disease

## 2022-12-21 NOTE — Telephone Encounter (Signed)
Prescription refill request for Eliquis received. Indication:PE Last office visit:6/24 Scr:1.02 8/24 Age: 57 Weight:136.9  KG  PRESCRIPTION REFILLED

## 2022-12-23 DIAGNOSIS — M419 Scoliosis, unspecified: Secondary | ICD-10-CM | POA: Diagnosis not present

## 2022-12-23 DIAGNOSIS — R109 Unspecified abdominal pain: Secondary | ICD-10-CM | POA: Diagnosis not present

## 2022-12-23 DIAGNOSIS — G8929 Other chronic pain: Secondary | ICD-10-CM | POA: Diagnosis not present

## 2022-12-31 DIAGNOSIS — R109 Unspecified abdominal pain: Secondary | ICD-10-CM | POA: Diagnosis not present

## 2022-12-31 DIAGNOSIS — G8929 Other chronic pain: Secondary | ICD-10-CM | POA: Diagnosis not present

## 2022-12-31 DIAGNOSIS — R350 Frequency of micturition: Secondary | ICD-10-CM | POA: Diagnosis not present

## 2023-01-26 DIAGNOSIS — D259 Leiomyoma of uterus, unspecified: Secondary | ICD-10-CM | POA: Diagnosis not present

## 2023-02-01 DIAGNOSIS — D219 Benign neoplasm of connective and other soft tissue, unspecified: Secondary | ICD-10-CM | POA: Diagnosis not present

## 2023-02-11 DIAGNOSIS — Z1231 Encounter for screening mammogram for malignant neoplasm of breast: Secondary | ICD-10-CM | POA: Diagnosis not present

## 2023-02-14 DIAGNOSIS — Z1231 Encounter for screening mammogram for malignant neoplasm of breast: Secondary | ICD-10-CM | POA: Diagnosis not present

## 2023-03-15 DIAGNOSIS — D259 Leiomyoma of uterus, unspecified: Secondary | ICD-10-CM | POA: Diagnosis not present

## 2023-03-15 DIAGNOSIS — Z7689 Persons encountering health services in other specified circumstances: Secondary | ICD-10-CM | POA: Diagnosis not present

## 2023-03-15 DIAGNOSIS — M546 Pain in thoracic spine: Secondary | ICD-10-CM | POA: Diagnosis not present

## 2023-03-15 DIAGNOSIS — R7303 Prediabetes: Secondary | ICD-10-CM | POA: Diagnosis not present

## 2023-04-14 DIAGNOSIS — M1712 Unilateral primary osteoarthritis, left knee: Secondary | ICD-10-CM | POA: Diagnosis not present

## 2023-04-19 ENCOUNTER — Telehealth: Payer: Self-pay | Admitting: Cardiovascular Disease

## 2023-04-19 DIAGNOSIS — I10 Essential (primary) hypertension: Secondary | ICD-10-CM

## 2023-04-19 DIAGNOSIS — E782 Mixed hyperlipidemia: Secondary | ICD-10-CM

## 2023-04-19 DIAGNOSIS — I739 Peripheral vascular disease, unspecified: Secondary | ICD-10-CM

## 2023-04-19 NOTE — Telephone Encounter (Signed)
Patient is wanting to schedule requested VAS [place] LOWER EXTREMITY ARTERIAL DUPLEX for the same day that she will be seeing Dr. Allyson Sabal on 10/10/2023 at 11:30 a.m.. This test expires on 10/06/2023. Requesting this be updated and scheduled.

## 2023-04-19 NOTE — Telephone Encounter (Signed)
Noted  New order placed

## 2023-05-03 DIAGNOSIS — M546 Pain in thoracic spine: Secondary | ICD-10-CM | POA: Diagnosis not present

## 2023-05-03 DIAGNOSIS — M170 Bilateral primary osteoarthritis of knee: Secondary | ICD-10-CM | POA: Diagnosis not present

## 2023-05-03 DIAGNOSIS — J309 Allergic rhinitis, unspecified: Secondary | ICD-10-CM | POA: Diagnosis not present

## 2023-05-03 DIAGNOSIS — R7303 Prediabetes: Secondary | ICD-10-CM | POA: Diagnosis not present

## 2023-05-18 ENCOUNTER — Telehealth: Payer: Self-pay | Admitting: Cardiovascular Disease

## 2023-05-18 NOTE — Telephone Encounter (Signed)
 Pt states that due to having stents in her leg we gave her letter to present in the case of going through a metal detector and setting it off she could present this letter for justification. Needing new letter

## 2023-05-23 NOTE — Telephone Encounter (Signed)
Patient identification verified by 2 forms. Marilynn Rail, RN    Called and spoke to patient  Informed patient letter available in [place]  Patient verbalized understanding, no questions at this time

## 2023-06-14 DIAGNOSIS — J309 Allergic rhinitis, unspecified: Secondary | ICD-10-CM | POA: Diagnosis not present

## 2023-06-14 DIAGNOSIS — R7303 Prediabetes: Secondary | ICD-10-CM | POA: Diagnosis not present

## 2023-06-14 DIAGNOSIS — M170 Bilateral primary osteoarthritis of knee: Secondary | ICD-10-CM | POA: Diagnosis not present

## 2023-07-12 DIAGNOSIS — G4700 Insomnia, unspecified: Secondary | ICD-10-CM | POA: Diagnosis not present

## 2023-07-12 DIAGNOSIS — I10 Essential (primary) hypertension: Secondary | ICD-10-CM | POA: Diagnosis not present

## 2023-07-12 DIAGNOSIS — Z23 Encounter for immunization: Secondary | ICD-10-CM | POA: Diagnosis not present

## 2023-07-12 DIAGNOSIS — Z0001 Encounter for general adult medical examination with abnormal findings: Secondary | ICD-10-CM | POA: Diagnosis not present

## 2023-07-12 DIAGNOSIS — R079 Chest pain, unspecified: Secondary | ICD-10-CM | POA: Diagnosis not present

## 2023-07-21 DIAGNOSIS — D219 Benign neoplasm of connective and other soft tissue, unspecified: Secondary | ICD-10-CM | POA: Diagnosis not present

## 2023-07-27 DIAGNOSIS — E782 Mixed hyperlipidemia: Secondary | ICD-10-CM | POA: Diagnosis not present

## 2023-07-27 DIAGNOSIS — R7303 Prediabetes: Secondary | ICD-10-CM | POA: Diagnosis not present

## 2023-07-27 DIAGNOSIS — I10 Essential (primary) hypertension: Secondary | ICD-10-CM | POA: Diagnosis not present

## 2023-08-09 DIAGNOSIS — Z8639 Personal history of other endocrine, nutritional and metabolic disease: Secondary | ICD-10-CM | POA: Diagnosis not present

## 2023-08-09 DIAGNOSIS — E559 Vitamin D deficiency, unspecified: Secondary | ICD-10-CM | POA: Diagnosis not present

## 2023-08-09 DIAGNOSIS — M79671 Pain in right foot: Secondary | ICD-10-CM | POA: Diagnosis not present

## 2023-08-09 DIAGNOSIS — R079 Chest pain, unspecified: Secondary | ICD-10-CM | POA: Diagnosis not present

## 2023-08-09 DIAGNOSIS — G4700 Insomnia, unspecified: Secondary | ICD-10-CM | POA: Diagnosis not present

## 2023-08-10 DIAGNOSIS — M79671 Pain in right foot: Secondary | ICD-10-CM | POA: Diagnosis not present

## 2023-08-13 DIAGNOSIS — Z1231 Encounter for screening mammogram for malignant neoplasm of breast: Secondary | ICD-10-CM | POA: Diagnosis not present

## 2023-08-15 DIAGNOSIS — Z1231 Encounter for screening mammogram for malignant neoplasm of breast: Secondary | ICD-10-CM | POA: Diagnosis not present

## 2023-09-09 DIAGNOSIS — I739 Peripheral vascular disease, unspecified: Secondary | ICD-10-CM | POA: Diagnosis not present

## 2023-09-09 DIAGNOSIS — Z01818 Encounter for other preprocedural examination: Secondary | ICD-10-CM | POA: Diagnosis not present

## 2023-09-09 DIAGNOSIS — D259 Leiomyoma of uterus, unspecified: Secondary | ICD-10-CM | POA: Diagnosis not present

## 2023-09-09 DIAGNOSIS — I272 Pulmonary hypertension, unspecified: Secondary | ICD-10-CM | POA: Diagnosis not present

## 2023-09-09 DIAGNOSIS — Z01810 Encounter for preprocedural cardiovascular examination: Secondary | ICD-10-CM | POA: Diagnosis not present

## 2023-09-10 DIAGNOSIS — Z01818 Encounter for other preprocedural examination: Secondary | ICD-10-CM | POA: Diagnosis not present

## 2023-09-19 ENCOUNTER — Encounter: Payer: Self-pay | Admitting: Cardiovascular Disease

## 2023-09-20 MED ORDER — APIXABAN 5 MG PO TABS
5.0000 mg | ORAL_TABLET | Freq: Two times a day (BID) | ORAL | 1 refills | Status: AC
Start: 2023-09-20 — End: ?

## 2023-09-20 NOTE — Telephone Encounter (Signed)
 Pt last saw Dr Katheryne Pane 10/06/22, last labs 07/27/23 Creat 0.89, age 58, weight 136.9kg, based on specified criteria pt is on appropriate dosage of Eliquis  5mg  BID for afib.  Will refill rx.

## 2023-09-29 DIAGNOSIS — D472 Monoclonal gammopathy: Secondary | ICD-10-CM | POA: Diagnosis not present

## 2023-09-30 DIAGNOSIS — D472 Monoclonal gammopathy: Secondary | ICD-10-CM | POA: Diagnosis not present

## 2023-09-30 DIAGNOSIS — D219 Benign neoplasm of connective and other soft tissue, unspecified: Secondary | ICD-10-CM | POA: Diagnosis not present

## 2023-09-30 DIAGNOSIS — M170 Bilateral primary osteoarthritis of knee: Secondary | ICD-10-CM | POA: Diagnosis not present

## 2023-09-30 DIAGNOSIS — Z7901 Long term (current) use of anticoagulants: Secondary | ICD-10-CM | POA: Diagnosis not present

## 2023-09-30 DIAGNOSIS — Z888 Allergy status to other drugs, medicaments and biological substances status: Secondary | ICD-10-CM | POA: Diagnosis not present

## 2023-09-30 DIAGNOSIS — D649 Anemia, unspecified: Secondary | ICD-10-CM | POA: Diagnosis not present

## 2023-09-30 DIAGNOSIS — E782 Mixed hyperlipidemia: Secondary | ICD-10-CM | POA: Diagnosis not present

## 2023-09-30 DIAGNOSIS — Z79899 Other long term (current) drug therapy: Secondary | ICD-10-CM | POA: Diagnosis not present

## 2023-09-30 DIAGNOSIS — N83312 Acquired atrophy of left ovary: Secondary | ICD-10-CM | POA: Diagnosis not present

## 2023-09-30 DIAGNOSIS — I70219 Atherosclerosis of native arteries of extremities with intermittent claudication, unspecified extremity: Secondary | ICD-10-CM | POA: Diagnosis not present

## 2023-09-30 DIAGNOSIS — I700 Atherosclerosis of aorta: Secondary | ICD-10-CM | POA: Diagnosis not present

## 2023-09-30 DIAGNOSIS — E559 Vitamin D deficiency, unspecified: Secondary | ICD-10-CM | POA: Diagnosis not present

## 2023-09-30 DIAGNOSIS — N83311 Acquired atrophy of right ovary: Secondary | ICD-10-CM | POA: Diagnosis not present

## 2023-09-30 DIAGNOSIS — E1151 Type 2 diabetes mellitus with diabetic peripheral angiopathy without gangrene: Secondary | ICD-10-CM | POA: Diagnosis not present

## 2023-09-30 DIAGNOSIS — Z6841 Body Mass Index (BMI) 40.0 and over, adult: Secondary | ICD-10-CM | POA: Diagnosis not present

## 2023-09-30 DIAGNOSIS — N852 Hypertrophy of uterus: Secondary | ICD-10-CM | POA: Diagnosis not present

## 2023-09-30 DIAGNOSIS — D6859 Other primary thrombophilia: Secondary | ICD-10-CM | POA: Diagnosis not present

## 2023-09-30 DIAGNOSIS — G2581 Restless legs syndrome: Secondary | ICD-10-CM | POA: Diagnosis not present

## 2023-09-30 DIAGNOSIS — I110 Hypertensive heart disease with heart failure: Secondary | ICD-10-CM | POA: Diagnosis not present

## 2023-09-30 DIAGNOSIS — G8918 Other acute postprocedural pain: Secondary | ICD-10-CM | POA: Diagnosis not present

## 2023-09-30 DIAGNOSIS — I272 Pulmonary hypertension, unspecified: Secondary | ICD-10-CM | POA: Diagnosis not present

## 2023-09-30 DIAGNOSIS — N939 Abnormal uterine and vaginal bleeding, unspecified: Secondary | ICD-10-CM | POA: Diagnosis not present

## 2023-09-30 DIAGNOSIS — I5032 Chronic diastolic (congestive) heart failure: Secondary | ICD-10-CM | POA: Diagnosis not present

## 2023-09-30 DIAGNOSIS — Z7902 Long term (current) use of antithrombotics/antiplatelets: Secondary | ICD-10-CM | POA: Diagnosis not present

## 2023-09-30 DIAGNOSIS — I509 Heart failure, unspecified: Secondary | ICD-10-CM | POA: Diagnosis not present

## 2023-09-30 DIAGNOSIS — E785 Hyperlipidemia, unspecified: Secondary | ICD-10-CM | POA: Diagnosis not present

## 2023-09-30 DIAGNOSIS — D252 Subserosal leiomyoma of uterus: Secondary | ICD-10-CM | POA: Diagnosis not present

## 2023-09-30 DIAGNOSIS — D251 Intramural leiomyoma of uterus: Secondary | ICD-10-CM | POA: Diagnosis not present

## 2023-09-30 DIAGNOSIS — D259 Leiomyoma of uterus, unspecified: Secondary | ICD-10-CM | POA: Diagnosis not present

## 2023-10-03 NOTE — Discharge Summary (Signed)
 Discharge Summary  Admitting Provider: Ozell KATHEE Mealing, DO Discharge Provider: Mealing Ozell KATHEE, DO  Admission Date: 09/30/2023     Discharge Date: 10/03/2023 Admission Status: Inpatient  Principal Diagnosis: Christina [name] is a 58 y.o. G1P0 with postmenopausal uterine bleeding, uterine leiomyomata, uterine hypertrophy  Discharge Diagnosis: Same as above  Problem List Patient Active Problem List  Diagnosis  . Aortic atherosclerosis  . Benign neoplasm of colon  . Chronic heart failure with preserved ejection fraction (HCC)  . Claudication in peripheral vascular disease  . Critical limb ischemia with history of revascularization of same extremity (CMS/HCC)  . Elevated sed rate  . Essential hypertension  . Family history of colon cancer  . History of anemia  . Hypercoagulable state (HHS/HCC)  . MGUS (monoclonal gammopathy of unknown significance)  . Mild pulmonary hypertension (HCC)  . Mixed hyperlipidemia  . OA (osteoarthritis) of shoulder  . Morbid obesity (CMS/HCC)  . Paresthesia  . Pelvic pain  . Pericardial effusion (HHS/HCC)  . Peripheral arterial disease  . Prediabetes  . Primary osteoarthritis of both knees  . Pseudoaneurysm following procedure  . Uterine fibroid  . Vitamin D  deficiency  . Chronic right-sided thoracic back pain  . Leiomyoma  . Encounter to establish care  . Murmur  . Allergic rhinitis  . Chest pain  . Insomnia  . Annual physical exam  . History of non anemic vitamin B12 deficiency  . Anxiety state  . Right foot pain  . Leiomyoma of uterus    Operative Procedures Performed PR LAPS TOTAL HYSTERECT 250 GM/< W/RMVL TUBE/OVARY [facility]   Hospital Course  Presenting Problem/History of Present Illness Christina [name] is a 58 y.o. G1P0 who was admitted to 2 tower after undergoing an abdominal supracervical hysterectomy with bilateral salpingo-oophorectomy for uterine fibroids, and postmenopausal uterine bleeding.  Patient was initially planned to  undergo a laparoscopic procedure however the size of the uterus was too large to proceed with this planned procedure and an open procedure was performed.  Patient was beginning to pass gas on the second postoperative day but did not feel comfortable discharging home at that time and requested to stay until this morning.  She denies headache, chest pain, shortness of breath or calf tenderness.  She denies nausea and is passing flatus.  She is voiding without difficulty and denies any vaginal bleeding.  Physical Exam at Discharge Discharge Condition: Stable Temp: 98.2 F (36.8 C)  Heart Rate: 80  Resp: 20  BP: 124/70 Vitals and nursing note reviewed.  Constitutional:      General: She is not in acute distress.    Appearance: Normal appearance. She is well-developed.  Mental Status: She is alert and oriented to person, place, and time.  HENT:     Head: Normocephalic and atraumatic. PERRLA Neck:     Thyroid : No thyromegaly.  Chest: Clear, Non labored breathing Abdomen:    Nontender, incision is dry and intact without erythema induration or drainage Extremities:    Without cyanosis, edema or hyperreflexia    Labs last 48 Hrs Lab Results  Component Value Date   WBC 13.08 (H) 10/01/2023   HGB 11.5 10/01/2023   HCT 35.2 10/01/2023   PLT 191 10/01/2023   Lab Results  Component Value Date   NA 141 10/01/2023   K 4.0 10/01/2023   CL 111 10/01/2023   CO2 20 10/01/2023   BUN 10 10/01/2023   CREATININE 0.75 10/01/2023   GLUCOSE 124 (H) 10/01/2023   GLUCOSE 103 09/30/2023  Discharge instructions   Discharge Disposition Final discharge: Home Code Status at Discharge: Full Code  Medications   Medication List     START taking these medications    gabapentin  100 mg capsule Commonly known as: NEURONTIN  Take 1 capsule (100 mg) 3 (three) times a day for 10 days   ibuprofen  600 mg tablet Commonly known as: MOTRIN  Take 1 tablet (600 mg) by mouth every 6 (six) hours as  needed for mild pain for up to 30 doses   oxyCODONE-acetaminophen  5-325 mg per tablet Commonly known as: PERCOCET Take 1 tablet by mouth every 4 (four) hours as needed for moderate pain for up to 24 doses Max Daily Amount: 6 tablets   senna-docusate 8.6-50 mg per tablet Commonly known as: PERICOLACE Take 2 tablets by mouth 2 (two) times a day for 7 days       CONTINUE taking these medications    apixaban  5 mg tablet Commonly known as: ELIQUIS  Take 1 tablet (5 mg) by mouth 2 (two) times a day   carvediloL  6.25 mg tablet Commonly known as: COREG  Take 1 tablet (6.25 mg) by mouth 2 (two) times a day with meals   CENTRUM ADULT 50 PLUS ORAL   clopidogreL  75 mg tablet Commonly known as: PLAVIX  Take 1 tablet (75 mg) by mouth Daily at bedtime   furosemide  20 mg tablet Commonly known as: LASIX  Take 1 tablet (20 mg) by mouth daily as needed (swelling)   hydrOXYzine 10 mg tablet Commonly known as: ATARAX Take 0.5-1 tablets (5-10 mg) by mouth 2 (two) times a day as needed for anxiety   irbesartan  150 mg tablet Commonly known as: AVAPRO  Take 1 tablet (150 mg) by mouth once daily in the afternoon   magnesium oxide 400 mg (241.3 mg magnesium) tablet Commonly known as: MAG-OX Take 1 tablet (400 mg) by mouth daily   megestroL  40 mg tablet Commonly known as: MEGACE  Take 1 tablet (40 mg) by mouth 2 (two) times a day.   metFORMIN  500 mg 24 hr tablet Commonly known as: GLUCOPHAGE -XR Take 1 tablet (500 mg) by mouth daily with breakfast   omeprazole  20 MG capsule Commonly known as: PriLOSEC Take 1 capsule (20 mg) by mouth in the morning and 1 capsule (20 mg) in the evening. Take before meals.   potassium chloride  10 mEq extended release tablet Commonly known as: KLOR-CON  Take 1 tablet (10 mEq) by mouth daily   pravastatin 20 mg tablet Commonly known as: PRAVACHOL Take 1 tablet (20 mg) by mouth once daily in the afternoon   tiZANidine 4 mg tablet Commonly known as:  ZANAFLEX Take 1-2 tablets (4-8 mg) by mouth at bedtime as needed for muscle spasms   traZODone 50 mg tablet Commonly known as: DESYREL Take 1 tablet (50 mg) by mouth at bedtime as needed (insomnia)       STOP taking these medications    escitalopram oxalate 10 mg tablet Commonly known as: LEXAPRO         Where to Get Your Medications     These medications were sent to [id number] - [place], [place] - [address] AT [facility] [address]  [address], [place] [address]    Phone: [phone]  gabapentin  100 mg capsule ibuprofen  600 mg tablet oxyCODONE-acetaminophen  5-325 mg per tablet    Information about where to get these medications is not yet available   Ask your nurse or doctor about these medications senna-docusate  8.6-50 mg per tablet     Outpatient Follow-Up Future Appointments  Date Time Provider Department Center  10/06/2023  8:15 AM Deanna Ozell NOVAK, DO [facility]

## 2023-10-10 ENCOUNTER — Ambulatory Visit: Payer: BC Managed Care – PPO | Admitting: Cardiovascular Disease

## 2023-10-10 ENCOUNTER — Encounter (HOSPITAL_COMMUNITY): Payer: BC Managed Care – PPO

## 2023-11-02 ENCOUNTER — Ambulatory Visit (HOSPITAL_COMMUNITY)

## 2023-11-02 ENCOUNTER — Ambulatory Visit: Admitting: Cardiovascular Disease

## 2023-11-02 DIAGNOSIS — D472 Monoclonal gammopathy: Secondary | ICD-10-CM | POA: Diagnosis not present

## 2023-11-25 ENCOUNTER — Encounter (HOSPITAL_COMMUNITY)

## 2023-11-29 ENCOUNTER — Other Ambulatory Visit (HOSPITAL_COMMUNITY): Payer: Self-pay | Admitting: Cardiovascular Disease

## 2023-11-29 DIAGNOSIS — I739 Peripheral vascular disease, unspecified: Secondary | ICD-10-CM

## 2023-11-30 ENCOUNTER — Ambulatory Visit
Admission: RE | Admit: 2023-11-30 | Discharge: 2023-11-30 | Disposition: A | Discharge status: Home / Self Care | Source: Ambulatory Visit | Attending: Cardiovascular Disease | Admitting: Cardiovascular Disease

## 2023-11-30 ENCOUNTER — Encounter: Payer: Self-pay | Admitting: Cardiovascular Disease

## 2023-11-30 ENCOUNTER — Ambulatory Visit (HOSPITAL_BASED_OUTPATIENT_CLINIC_OR_DEPARTMENT_OTHER)
Admission: RE | Admit: 2023-11-30 | Discharge: 2023-11-30 | Disposition: A | Discharge status: Home / Self Care | Source: Ambulatory Visit | Attending: Cardiovascular Disease | Admitting: Cardiovascular Disease

## 2023-11-30 ENCOUNTER — Ambulatory Visit (INDEPENDENT_AMBULATORY_CARE_PROVIDER_SITE_OTHER): Admitting: Cardiovascular Disease

## 2023-11-30 ENCOUNTER — Ambulatory Visit: Payer: Self-pay | Admitting: Cardiovascular Disease

## 2023-11-30 VITALS — BP 130/70 | HR 84 | Ht 65.0 in | Wt 289.0 lb

## 2023-11-30 DIAGNOSIS — I739 Peripheral vascular disease, unspecified: Secondary | ICD-10-CM

## 2023-11-30 DIAGNOSIS — I10 Essential (primary) hypertension: Secondary | ICD-10-CM | POA: Diagnosis not present

## 2023-11-30 DIAGNOSIS — E782 Mixed hyperlipidemia: Secondary | ICD-10-CM

## 2023-11-30 LAB — VAS US ABI WITH/WO TBI
Left ABI: 1.13
Right ABI: 0.95

## 2023-11-30 NOTE — Progress Notes (Signed)
 11/30/2023 Christina [name]   1966-04-04  [id number]  Primary Physician Duwayne Katheryn HERO, PA Primary Cardiologist: Dorn JINNY Lesches MD [facility] [place], [place]  HPI:  Christina [name] is a 58 y.o.  severely overweight single African-American female mother of 1 child, grandmother and 3 grandchildren who works Scientist, product/process development at [employer].  She was referred by Dr. Alveta for peripheral vascular evaluation because of  right calf claudication which was lifestyle limiting.  I last saw her in the office 10/06/2021. She has a history of hypertension.  She has chronic chest pain with a recent negative Myoview  and a negative coronary CTA 1 year ago.  She had fairly recent onset right calf pain approxi-1 month ago that occurred when she woke up 1 morning and has been fairly persistent with ambulation.  She had lower extremity arterial Doppler studies performed 04/28/2018 revealing a right ABI 0.61 with an occluded right popliteal artery.   I performed peripheral angiography 05/08/2018 revealing occluded above-knee popliteal artery extending down below the knee into the proximal anterior tibial and tibioperoneal trunk.  She had a long complex procedure with penumbra aspiration thrombectomy followed by balloon angioplasty and implantation of 2 overlapping Tigris self-expanding stents beginning in the popliteal artery extending down into the anterior tibial artery.  Unfortunately her tibioperoneal trunk remains occluded.  Her Dopplers normalized and her pain is resolved.  She did unfortunately have a left common femoral pseudoaneurysm and ultimately underwent ultrasound-guided thrombin  injection and successfully was closed.   She is on Eliquis  and clopidogrel .  Dopplers performed 05/26/2018 revealed a right ABI of 1.24 with a patent stent.  She does continue to complain of atypical chest pain beginning under her right breast and going into her neck and jaw.  She had a negative Myoview  stress test in December of last year  and a coronary CTA revealing a coronary calcium  score of 0 with normal coronary arteries.  In addition, she is taken sublingual nitroglycerin  for this which does not work.  I reassured her that I do not think this is related to coronary artery disease.   She continues to have right calf claudication similar to her preintervention symptoms as well as numbness of her toes.  She describes recurrent claudication over the last 2 months when walking in [place].  Her most recent Dopplers show decline in her right ABI from 1.24 down to 0.93 with a moderate increase in the velocities within the tibioperoneal stent.  Based on this we decided to proceed with outpatient angiography   I performed peripheral angiography on her via the left femoral approach 07/12/2019 revealing 80% in-stent restenosis within the popliteal and tibioperoneal stent.  Performed drug-coated balloon angioplasty with excellent result.  Her claudication has resolved and her Dopplers are normalized.  She is back on triple therapy for 1 month after which aspirin  will be discontinued.   Since I saw her in the office 1 year ago she continues to do well.  Her Dopplers performed today showed a patent right popliteal artery stent with mild progression of disease although she denies claudication.  She denies chest pain or shortness of breath.  Current Meds  Medication Sig   apixaban  (ELIQUIS ) 5 MG TABS tablet Take 1 tablet (5 mg total) by mouth 2 (two) times daily.   carvedilol  (COREG ) 6.25 MG tablet Take 6.25 mg by mouth 2 (two) times daily with a meal.   Cholecalciferol (VITAMIN D3) 50 MCG (2000 UT) CAPS Take 2,000 Units by mouth at  bedtime.   clopidogrel  (PLAVIX ) 75 MG tablet TAKE 1 TABLET BY MOUTH AT BEDTIME   cyanocobalamin  (VITAMIN B12) 1000 MCG tablet Take 1,000 mcg by mouth daily.   furosemide  (LASIX ) 20 MG tablet Take 20 mg by mouth daily.   irbesartan  (AVAPRO ) 150 MG tablet Take 1 tablet (150 mg total) by mouth daily.   megestrol   (MEGACE ) 40 MG tablet Take 1 tablet (40 mg total) by mouth 2 (two) times daily.   metFORMIN  (GLUCOPHAGE -XR) 500 MG 24 hr tablet Take 1 tablet by mouth daily with breakfast.   potassium chloride  (KLOR-CON ) 10 MEQ tablet Take 10 mEq by mouth at bedtime.   pravastatin (PRAVACHOL) 20 MG tablet Take 20 mg by mouth at bedtime.   Vitamin D , Ergocalciferol , (DRISDOL ) 1.25 MG (50000 UNIT) CAPS capsule Take 50,000 Units by mouth every Wednesday.     Allergies  Allergen Reactions   Lipitor  [Atorvastatin ]     Myalgias   Lisinopril  Cough    Social History   Socioeconomic History   Marital status: Single    Spouse name: Not on file   Number of children: 1   Years of education: two years of college   Highest education level: Not on file  Occupational History   Not on file  Tobacco Use   Smoking status: Never   Smokeless tobacco: Never  Vaping Use   Vaping status: Never Used  Substance and Sexual Activity   Alcohol use: No   Drug use: No   Sexual activity: Yes  Other Topics Concern   Not on file  Social History Narrative   Lives alone.   Right-handed.   No daily use of caffeine.   Social Drivers of Corporate investment banker Strain: Low Risk  (09/30/2023)   Received from [facility]   Overall Financial Resource Strain (CARDIA)    Difficulty of Paying Living Expenses: Not very hard  Food Insecurity: No Food Insecurity (09/30/2023)   Received from [facility]   [facility] Hunger Vital Sign    Within the past 12 months, the food you bought just didn't last and you didn't have money to get more.: Never true  Transportation Needs: No Transportation Needs (09/30/2023)   Received from [facility]   [facility] PRAPARE - Transportation    In the past 12 months, has lack of transportation kept you from medical appointments or from getting medications?: No  Physical Activity: Inactive (07/03/2021)   Received from [facility] visits prior to  07/03/2022., [facility]   Exercise Vital Sign    On average, how many days per week do you engage in moderate to strenuous exercise (like a brisk walk)?: 0 days    On average, how many minutes do you engage in exercise at this level?: 0 min  Stress: No Stress Concern Present (07/03/2021)   Received from [facility] visits prior to 07/03/2022., [facility]   [employer] of Occupational Health - Occupational Stress Questionnaire    Feeling of Stress : Not at all  Social Connections: Unknown (10/30/2021)   Received from [facility]   Social Connections    Frequency of Communication with Friends and Family: Not asked    Frequency of Social Gatherings with Friends and Family: Not asked  Intimate Partner Violence: Not At Risk (09/30/2023)   Received from [facility]   [facility] Intimate Partner Violence_Source    Within the last year, have you been afraid of your partner or  ex-partner?: No     Review of Systems: General: negative for chills, fever, night sweats or weight changes.  Cardiovascular: negative for chest pain, dyspnea on exertion, edema, orthopnea, palpitations, paroxysmal nocturnal dyspnea or shortness of breath Dermatological: negative for rash Respiratory: negative for cough or wheezing Urologic: negative for hematuria Abdominal: negative for nausea, vomiting, diarrhea, bright red blood per rectum, melena, or hematemesis Neurologic: negative for visual changes, syncope, or dizziness All other systems reviewed and are otherwise negative except as noted above.    Blood pressure 130/70, pulse 84, height 5' 5 (1.651 m), weight 289 lb (131.1 kg), last menstrual period 01/31/2017, SpO2 98%.  General appearance: alert and no distress Neck: no adenopathy, no carotid bruit, no JVD, supple, symmetrical, trachea midline, and thyroid  not enlarged, symmetric, no tenderness/mass/nodules Lungs: clear to auscultation bilaterally Heart: regular rate and  rhythm, S1, S2 normal, no murmur, click, rub or gallop Extremities: extremities normal, atraumatic, no cyanosis or edema Pulses: 2+ and symmetric Skin: Skin color, texture, turgor normal. No rashes or lesions Neurologic: Grossly normal  EKG EKG Interpretation Date/Time:  Wednesday November 30 2023 11:48:52 EDT Ventricular Rate:  84 PR Interval:  156 QRS Duration:  82 QT Interval:  400 QTC Calculation: 472 R Axis:   -7  Text Interpretation: Normal sinus rhythm Minimal voltage criteria for LVH, may be normal variant ( R in aVL ) No previous ECGs available Confirmed by Court Carrier [phone]) on 11/30/2023 12:16:38 PM    ASSESSMENT AND PLAN:   Morbid obesity (HCC) BMI 48  Essential hypertension History of essential hypertension blood pressure measured today 130/70.  She is on carvedilol  and Avapro .  Peripheral arterial disease (HCC) History of PAD status post angiography by myself 05/08/2018 revealing an occluded above-the-knee popliteal artery extending down below the knee into the proximal anterior tibial and tibioperoneal trunk.  I performed a complex long procedure involving penumbra aspiration thrombectomy followed by balloon angioplasty and implantation of 2 overlapping tiger self-expanding stents.  I restudied her 07/12/2019 revealing 80% in-stent restenosis I performed drug-coated balloon angioplasty with excellent result.  Claudication resolved.  Her Dopplers performed today revealed her stents to be widely patent.  She currently denies claudication.  Mixed hyperlipidemia History of mixed hyperlipidemia on pravastatin with lipid profile performed 09/05/2018 revealing LDL of 55     Carrier DOROTHA Court MD [facility], [facility] 11/30/2023 12:27 PM

## 2023-11-30 NOTE — Assessment & Plan Note (Addendum)
BMI 48 

## 2023-11-30 NOTE — Patient Instructions (Addendum)
 Medication Instructions:  Your physician recommends that you continue on your current medications as directed. Please refer to the Current Medication list given to you today.  *If you need a refill on your cardiac medications before your next appointment, please call your pharmacy*  Testing/Procedures: Your physician has requested that you have a lower extremity arterial duplex. During this test, ultrasound is used to evaluate arterial blood flow in the legs. Allow one hour for this exam. There are no restrictions or special instructions. This will take place at [address] 4th floor **To do in July 2026**  Please note: We ask at that you not bring children with you during ultrasound (echo/ vascular) testing. Due to room size and safety concerns, children are not allowed in the ultrasound rooms during exams. Our front office staff cannot provide observation of children in our lobby area while testing is being conducted. An adult accompanying a patient to their appointment will only be allowed in the ultrasound room at the discretion of the ultrasound technician under special circumstances. We apologize for any inconvenience.  Your physician has requested that you have an ankle brachial index (ABI). During this test an ultrasound and blood pressure cuff are used to evaluate the arteries that supply the arms and legs with blood. Allow thirty minutes for this exam. There are no restrictions or special instructions. This will take place at [address], 4th floor   **To do in July 2026**   Please note: We ask at that you not bring children with you during ultrasound (echo/ vascular) testing. Due to room size and safety concerns, children are not allowed in the ultrasound rooms during exams. Our front office staff cannot provide observation of children in our lobby area while testing is being conducted. An adult accompanying a patient to their appointment will only be allowed in the ultrasound room  at the discretion of the ultrasound technician under special circumstances. We apologize for any inconvenience.   Follow-Up: At [facility], you and your health needs are our priority.  As part of our continuing mission to provide you with exceptional heart care, our providers are all part of one team.  This team includes your primary Cardiologist (physician) and Advanced Practice Providers or APPs (Physician Assistants and Nurse Practitioners) who all work together to provide you with the care you need, when you need it.  Your next appointment:   12 month(s)  Provider: Dorn Lesches, MD    We recommend signing up for the patient portal called MyChart.  Sign up information is provided on this After Visit Summary.  MyChart is used to connect with patients for Virtual Visits (Telemedicine).  Patients are able to view lab/test results, encounter notes, upcoming appointments, etc.  Non-urgent messages can be sent to your provider as well.   To learn more about what you can do with MyChart, go to [email or web address].   Other Instructions [email or web address] for copay card

## 2023-11-30 NOTE — Assessment & Plan Note (Signed)
 History of PAD status post angiography by myself 05/08/2018 revealing an occluded above-the-knee popliteal artery extending down below the knee into the proximal anterior tibial and tibioperoneal trunk.  I performed a complex long procedure involving penumbra aspiration thrombectomy followed by balloon angioplasty and implantation of 2 overlapping tiger self-expanding stents.  I restudied her 07/12/2019 revealing 80% in-stent restenosis I performed drug-coated balloon angioplasty with excellent result.  Claudication resolved.  Her Dopplers performed today revealed her stents to be widely patent.  She currently denies claudication.

## 2023-11-30 NOTE — Assessment & Plan Note (Signed)
 History of essential hypertension blood pressure measured today 130/70.  She is on carvedilol  and Avapro .

## 2023-11-30 NOTE — Assessment & Plan Note (Addendum)
 History of mixed hyperlipidemia on pravastatin with lipid profile performed 4 months ago revealing total cholesterol 162, LDL of 88 and HDL of 54.  Her coronary calcium  score was 0.

## 2023-12-04 ENCOUNTER — Encounter: Payer: Self-pay | Admitting: Cardiovascular Disease

## 2024-01-11 DIAGNOSIS — M170 Bilateral primary osteoarthritis of knee: Secondary | ICD-10-CM | POA: Diagnosis not present

## 2024-03-13 ENCOUNTER — Telehealth: Payer: Self-pay | Admitting: Cardiovascular Disease

## 2024-03-13 DIAGNOSIS — I498 Other specified cardiac arrhythmias: Secondary | ICD-10-CM | POA: Diagnosis not present

## 2024-03-13 DIAGNOSIS — I739 Peripheral vascular disease, unspecified: Secondary | ICD-10-CM | POA: Diagnosis not present

## 2024-03-13 DIAGNOSIS — T148XXA Other injury of unspecified body region, initial encounter: Secondary | ICD-10-CM | POA: Diagnosis not present

## 2024-03-13 DIAGNOSIS — Z8639 Personal history of other endocrine, nutritional and metabolic disease: Secondary | ICD-10-CM | POA: Diagnosis not present

## 2024-03-13 DIAGNOSIS — E559 Vitamin D deficiency, unspecified: Secondary | ICD-10-CM | POA: Diagnosis not present

## 2024-03-13 NOTE — Telephone Encounter (Signed)
 Patient calling to speak with the nurse to discuss somethings from her PCP. Please advise

## 2024-03-13 NOTE — Telephone Encounter (Signed)
 Left message for patient to return the call.

## 2024-03-18 ENCOUNTER — Encounter: Payer: Self-pay | Admitting: Cardiovascular Disease

## 2024-03-27 DIAGNOSIS — D472 Monoclonal gammopathy: Secondary | ICD-10-CM | POA: Diagnosis not present

## 2024-03-27 DIAGNOSIS — I498 Other specified cardiac arrhythmias: Secondary | ICD-10-CM | POA: Diagnosis not present

## 2024-03-27 NOTE — Telephone Encounter (Signed)
See mychart message for more details 

## 2024-04-03 DIAGNOSIS — E782 Mixed hyperlipidemia: Secondary | ICD-10-CM | POA: Diagnosis not present

## 2024-04-03 DIAGNOSIS — I739 Peripheral vascular disease, unspecified: Secondary | ICD-10-CM | POA: Diagnosis not present

## 2024-04-03 DIAGNOSIS — R7301 Impaired fasting glucose: Secondary | ICD-10-CM | POA: Diagnosis not present

## 2024-04-03 DIAGNOSIS — E559 Vitamin D deficiency, unspecified: Secondary | ICD-10-CM | POA: Diagnosis not present

## 2024-04-13 DIAGNOSIS — I498 Other specified cardiac arrhythmias: Secondary | ICD-10-CM | POA: Diagnosis not present

## 2024-04-19 DIAGNOSIS — M170 Bilateral primary osteoarthritis of knee: Secondary | ICD-10-CM | POA: Diagnosis not present

## 2024-04-19 DIAGNOSIS — Z6841 Body Mass Index (BMI) 40.0 and over, adult: Secondary | ICD-10-CM | POA: Diagnosis not present

## 2024-04-24 DIAGNOSIS — I498 Other specified cardiac arrhythmias: Secondary | ICD-10-CM | POA: Diagnosis not present

## 2024-04-26 ENCOUNTER — Encounter: Payer: Self-pay | Admitting: Cardiovascular Disease

## 2024-04-27 ENCOUNTER — Telehealth: Payer: Self-pay | Admitting: Cardiovascular Disease

## 2024-04-27 NOTE — Telephone Encounter (Signed)
 Patient returned RN Carlyle's call regarding results and stated she lives in [place]  and wants a call back to discuss further

## 2024-05-09 ENCOUNTER — Telehealth: Payer: Self-pay

## 2024-05-09 MED ORDER — CARVEDILOL 12.5 MG PO TABS: 12.5000 mg | ORAL_TABLET | Freq: Two times a day (BID) | ORAL | 3 refills | Status: AC

## 2024-05-09 NOTE — Telephone Encounter (Signed)
 Per verbal order from Dr. Court: pt can increase carvedilol  to 12.5mg  BID.  Spoke with pt and she is aware to increase carvedilol  from 6.25mg  BID ot 12.5mg  BID. Advised pt that she can double her current tablets until supply is complete. New prescription sent to pt's preferred pharmacy. Pt verbalizes understanding.

## 2024-05-09 NOTE — Telephone Encounter (Signed)
-----   Message from Dorn Lesches, MD sent at 05/01/2024 10:36 AM EST ----- Holter fairly benign however I haven't seen her in the office for 6 months and she had no Sx referable to arrhythmias at that time. Needs ROV with me or an APP to discuss. ----- Message ----- From: Stephinie Battisti L, RN Sent: 04/30/2024   4:21 PM EST To: Dorn JINNY Lesches, MD  Please advise. Thank you!

## 2024-05-09 NOTE — Telephone Encounter (Signed)
 Spoke with Christina [name] regarding recent monitor that was ordered by her PCP, Dr. Daryel Deep.  Results:   Per Dr. Court, Holter fairly benign however I haven't seen her in the office for 6 months and she had no Sx referable to arrhythmias at that time. Needs ROV with me or an APP to discuss.   Christina [name] does live in [place]  and usually travels to [place], [place] once a year for follow up of her peripheral dopplers and hyperlipidemia. Christina [name] states she has never had issues with heart rhythm in the past. Christina [name] now notices that every night when she lays down in bed to watch TV she can feel a fluttering in her chest that feels like palpitations. Christina [name] has no recent changes in health status, she is staying hydrated and she doesn't drink beverages with caffeine. Christina [name] has been eating as normal and has not been sick recently. Christina [name] states that her medication list is current and she only checks her blood pressure at office visits and this is always normal. Advised that her message would be sent to Dr. Court to advise.

## 2024-05-21 ENCOUNTER — Other Ambulatory Visit (HOSPITAL_COMMUNITY): Payer: Self-pay

## 2024-05-21 ENCOUNTER — Encounter: Payer: Self-pay | Admitting: Cardiovascular Disease

## 2024-05-21 ENCOUNTER — Telehealth: Payer: Self-pay | Admitting: Pharmacy Technician

## 2024-05-21 NOTE — Telephone Encounter (Signed)
" ° ° ° °  Pa not needed lf 05/21/24 and too soon until 07/28/24  I tried to get the patient to a coupon and it said: Duplicate records are found. Please call [phone]  [phone]) to speak with a live specialist.  I called the patient and she said she is on the phone with [facility] now and told me to call her back  "
